# Patient Record
Sex: Male | Born: 1945 | Race: White | Hispanic: No | Smoking: Current every day smoker
Health system: Southern US, Community
[De-identification: ages and names within clinical notes are randomized; demographics above are authoritative.]

## PROBLEM LIST (undated history)

## (undated) DIAGNOSIS — Z7901 Long term (current) use of anticoagulants: Secondary | ICD-10-CM

## (undated) DIAGNOSIS — J189 Pneumonia, unspecified organism: Secondary | ICD-10-CM

## (undated) DIAGNOSIS — J449 Chronic obstructive pulmonary disease, unspecified: Secondary | ICD-10-CM

## (undated) DIAGNOSIS — I82409 Acute embolism and thrombosis of unspecified deep veins of unspecified lower extremity: Secondary | ICD-10-CM

## (undated) DIAGNOSIS — I480 Paroxysmal atrial fibrillation: Secondary | ICD-10-CM

## (undated) DIAGNOSIS — M1990 Unspecified osteoarthritis, unspecified site: Secondary | ICD-10-CM

## (undated) DIAGNOSIS — I2699 Other pulmonary embolism without acute cor pulmonale: Secondary | ICD-10-CM

## (undated) DIAGNOSIS — I509 Heart failure, unspecified: Secondary | ICD-10-CM

## (undated) HISTORY — PX: FRACTURE SURGERY: SHX138

---

## 1967-11-29 DIAGNOSIS — J189 Pneumonia, unspecified organism: Secondary | ICD-10-CM

## 1967-11-29 HISTORY — DX: Pneumonia, unspecified organism: J18.9

## 1977-11-28 HISTORY — PX: ANKLE FRACTURE SURGERY: SHX122

## 2010-12-23 ENCOUNTER — Inpatient Hospital Stay (HOSPITAL_COMMUNITY)
Admission: EM | Admit: 2010-12-23 | Discharge: 2010-12-29 | DRG: 177 | Disposition: A | Discharge status: Home / Self Care | Payer: Self-pay | Attending: Family Medicine | Admitting: Family Medicine

## 2010-12-23 DIAGNOSIS — J852 Abscess of lung without pneumonia: Principal | ICD-10-CM | POA: Diagnosis present

## 2010-12-23 DIAGNOSIS — J9819 Other pulmonary collapse: Secondary | ICD-10-CM | POA: Diagnosis present

## 2010-12-23 DIAGNOSIS — E872 Acidosis, unspecified: Secondary | ICD-10-CM | POA: Diagnosis present

## 2010-12-23 DIAGNOSIS — J690 Pneumonitis due to inhalation of food and vomit: Secondary | ICD-10-CM | POA: Diagnosis present

## 2010-12-23 DIAGNOSIS — Q211 Atrial septal defect: Secondary | ICD-10-CM

## 2010-12-23 DIAGNOSIS — I498 Other specified cardiac arrhythmias: Secondary | ICD-10-CM | POA: Diagnosis present

## 2010-12-23 DIAGNOSIS — N2589 Other disorders resulting from impaired renal tubular function: Secondary | ICD-10-CM | POA: Diagnosis present

## 2010-12-23 DIAGNOSIS — J4489 Other specified chronic obstructive pulmonary disease: Secondary | ICD-10-CM | POA: Diagnosis present

## 2010-12-23 DIAGNOSIS — Q2111 Secundum atrial septal defect: Secondary | ICD-10-CM

## 2010-12-23 DIAGNOSIS — Q619 Cystic kidney disease, unspecified: Secondary | ICD-10-CM

## 2010-12-23 DIAGNOSIS — K5900 Constipation, unspecified: Secondary | ICD-10-CM | POA: Diagnosis present

## 2010-12-23 DIAGNOSIS — E876 Hypokalemia: Secondary | ICD-10-CM | POA: Diagnosis present

## 2010-12-23 DIAGNOSIS — F17200 Nicotine dependence, unspecified, uncomplicated: Secondary | ICD-10-CM | POA: Diagnosis present

## 2010-12-23 DIAGNOSIS — J449 Chronic obstructive pulmonary disease, unspecified: Secondary | ICD-10-CM | POA: Diagnosis present

## 2010-12-23 DIAGNOSIS — D539 Nutritional anemia, unspecified: Secondary | ICD-10-CM | POA: Diagnosis present

## 2010-12-23 DIAGNOSIS — E785 Hyperlipidemia, unspecified: Secondary | ICD-10-CM | POA: Diagnosis present

## 2010-12-23 LAB — DIFFERENTIAL
Basophils Absolute: 0 10*3/uL (ref 0.0–0.1)
Eosinophils Absolute: 0 10*3/uL (ref 0.0–0.7)
Eosinophils Relative: 0 % (ref 0–5)
Monocytes Absolute: 1.1 10*3/uL — ABNORMAL HIGH (ref 0.1–1.0)
Neutrophils Relative %: 92 % — ABNORMAL HIGH (ref 43–77)

## 2010-12-23 LAB — HEPATIC FUNCTION PANEL
Bilirubin, Direct: 0.2 mg/dL (ref 0.0–0.3)
Indirect Bilirubin: 0.4 mg/dL (ref 0.3–0.9)
Total Protein: 5.9 g/dL — ABNORMAL LOW (ref 6.0–8.3)

## 2010-12-23 LAB — BASIC METABOLIC PANEL
Calcium: 8.3 mg/dL — ABNORMAL LOW (ref 8.4–10.5)
GFR calc Af Amer: >60 mL/min (ref >60)
GFR calc non Af Amer: >60 mL/min (ref >60)
Potassium: 2.8 mEq/L — ABNORMAL LOW (ref 3.5–5.1)
Sodium: 142 mEq/L (ref 135–145)

## 2010-12-23 LAB — CBC
Platelets: 289 10*3/uL (ref 150–400)
RDW: 14.8 % (ref 11.5–15.5)
WBC: 26.5 10*3/uL — ABNORMAL HIGH (ref 4.0–10.5)

## 2010-12-23 LAB — APTT: aPTT: 30 seconds (ref 24–37)

## 2010-12-23 LAB — MAGNESIUM: Magnesium: 1.3 mg/dL — ABNORMAL LOW (ref 1.5–2.5)

## 2010-12-23 LAB — PROTIME-INR: Prothrombin Time: 15.7 seconds — ABNORMAL HIGH (ref 11.6–15.2)

## 2010-12-24 DIAGNOSIS — J690 Pneumonitis due to inhalation of food and vomit: Secondary | ICD-10-CM

## 2010-12-24 DIAGNOSIS — R911 Solitary pulmonary nodule: Secondary | ICD-10-CM

## 2010-12-24 DIAGNOSIS — J852 Abscess of lung without pneumonia: Secondary | ICD-10-CM

## 2010-12-24 LAB — LIPID PANEL
Cholesterol: 128 mg/dL (ref 0–200)
HDL: 25 mg/dL — ABNORMAL LOW (ref >39)

## 2010-12-24 LAB — BASIC METABOLIC PANEL
BUN: 7 mg/dL (ref 6–23)
Chloride: 116 mEq/L — ABNORMAL HIGH (ref 96–112)
Creatinine, Ser: 0.8 mg/dL (ref 0.4–1.5)
GFR calc Af Amer: >60 mL/min (ref >60)
GFR calc non Af Amer: >60 mL/min (ref >60)
Glucose, Bld: 117 mg/dL — ABNORMAL HIGH (ref 70–99)

## 2010-12-24 LAB — EXPECTORATED SPUTUM ASSESSMENT W GRAM STAIN, RFLX TO RESP C

## 2010-12-24 LAB — CBC
Hemoglobin: 9.9 g/dL — ABNORMAL LOW (ref 13.0–17.0)
MCH: 34 pg (ref 26.0–34.0)
MCV: 99 fL (ref 78.0–100.0)
Platelets: 320 10*3/uL (ref 150–400)
RBC: 2.91 MIL/uL — ABNORMAL LOW (ref 4.22–5.81)
RDW: 15.3 % (ref 11.5–15.5)
WBC: 18.7 10*3/uL — ABNORMAL HIGH (ref 4.0–10.5)

## 2010-12-24 LAB — NA AND K (SODIUM & POTASSIUM), RAND UR
Potassium Urine: 37 mEq/L
Sodium, Ur: 61 mEq/L

## 2010-12-24 LAB — MAGNESIUM: Magnesium: 1.4 mg/dL — ABNORMAL LOW (ref 1.5–2.5)

## 2010-12-24 NOTE — H&P (Signed)
NAME:  Jeremy Herman, Jeremy Herman NO.:  [id number]  MEDICAL RECORD NO.:  [id number]          PATIENT TYPE:  INP  LOCATION:  1832                         FACILITY:  [facility]  PHYSICIAN:  Vania Rea, M.D. DATE OF BIRTH:  04/04/1946  DATE OF ADMISSION:  12/23/2010 DATE OF DISCHARGE:                             HISTORY & PHYSICAL   PRIMARY CARE PHYSICIAN:  Louanna Raw at [facility].  CHIEF COMPLAINT:  Palpitations.  HISTORY OF PRESENT ILLNESS:  This is a 65 year old Caucasian gentleman, who considers himself in very fair health, does not visit hospital much, tends to go to his primary care physician whenever he has any minor problems.  He said his last visit to his physician about one month ago for diarrhea and received penicillin for possible infectious colitis, but today started having severe palpitations, went to his physician, who found him to be in SVT, he was transported to the emergency room, he received adenosine, and SVT resolved but on evaluation in the emergency room, he was found to have metabolic derangement and to have an abnormal chest x-ray.  Hospitalist service called to assist with admission.  The patient denies any fever or night sweats, but he has been having cough productive of brown-creamy sputum for the past 2-3 weeks and he has lost 20 pounds in weight over the past 4 weeks.  He smokes approximately a pack to a pack and a half per day for the past 40 years. He denies any family history of cancer.  Denies exposure to any infectious disease.  He is married without children.  Lives with his wife.  PAST MEDICAL HISTORY: 1. Tobacco abuse. 2. Remote history of alcohol abuse.  He has been clean for the past 10     years.  MEDICATIONS:  None.  ALLERGIES:  No known drug allergies.  SOCIAL HISTORY:  Currently, smoking three-quarter pack of cigarettes per day, smoked as much as a pack and a half over the past 40 years. Alcohol use as  noted above.  Denies illicit drug use.  He is a retired Public affairs consultant.  His mother died of a brain aneurysm.  His father died in his 17s.  He does not know his medical problems.  He has one brother, who was far as he noticed health.  FAMILY HISTORY:  There is no family history of addictions.  REVIEW OF SYSTEMS:  Other than noted above are unremarkable.  The patient said he had a colonoscopy around age 83 and at that time, it was normal.  PHYSICAL EXAMINATION:  GENERAL:  Pleasant, middle-aged Caucasian gentleman, sitting up in the stretcher, in no acute distress. VITAL SIGNS:  His temperature is 99.3, his pulse is 106, his respiration 20, blood pressure 99/48, he is saturating 96% on room air. HEENT:  His pupils are round and equal.  Mucous membranes are pink. Anicteric. NECK:  No cervical lymphadenopathy or thyromegaly.  No jugular venous distention. CHEST:  He has coarse crackles in the right midzone in the left lower lobe posteriorly. CARDIOVASCULAR SYSTEM:  Regular rhythm.  No murmur heard. ABDOMEN:  Soft and nontender.  No masses. EXTREMITIES:  Without edema.  He has 2+ dorsalis pedis pulses bilaterally. CENTRAL NERVOUS SYSTEM:  Cranial nerves II through XII are grossly intact.  He has no focal neurologic deficit.  LABORATORY DATA:  His sodium is 142, potassium is 2.8, chloride 114, CO2 of 20, glucose 125, BUN 10, creatinine 0.9, and calcium 8.3.  His white count is 26.5, hemoglobin 10.2, hematocrit 29.1, and platelets 289,000. He has 92% neutrophils.  Absolute neutrophil count is 24.3.  Morphology shows toxic granulations.  Chest x-ray shows cavities with air-fluid levels in the superior segments of the right lower lobe, small amount of linear atelectasis or scarring in the left lung base.  Mild bronchitic changes.  The CT scan of his chest with contrast shows adjacent cavity and mass-like areas with moderate wall thickening and air-fluid levels in the superior segment  of the right lower lobe measuring 10.4 cm in maximum diameter.  A 2.9-cm mass-like density in the left lower lobe, patchy densities in the right upper and middle lobes, the combination of lung findings most compatible with infectious process with septic emboli, malignancy less likely, incompletely included and incompletely assessed complex left renal cyst.  Renal ultrasound recommended to exclude a complex cystic renal mass and mild hepatic steatosis.  ASSESSMENT: 1. Supraventricular tachycardia, resolved. 2. Hypokalemia causing the above. 3. Multiple lung abscesses. 4. Probable chronic bronchitis. 5. Tobacco abuse. 6. History of alcohol abuse.  PLAN: 1. We will admit this gentleman for antibiotic therapy and repletion     of his potassium.  Because of the possible multiple lung abscesses,     we will get a 2-D echo to look for source of emboli and we will     also get an abdominal ultrasound to look for any evidence of other     infectious foci. 2. Other plans as per order.     Vania Rea, M.D.     LC/MEDQ  D:  12/23/2010  T:  12/23/2010  Job:  [id number]  cc:   Louanna Raw  Electronically Signed by Vania Rea M.D. on 12/24/2010 11:06:28 PM

## 2010-12-25 LAB — CHLORIDE, URINE, RANDOM: Chloride Urine: 79 mEq/L

## 2010-12-25 LAB — RENAL FUNCTION PANEL
CO2: 15 mEq/L — ABNORMAL LOW (ref 19–32)
Calcium: 7.5 mg/dL — ABNORMAL LOW (ref 8.4–10.5)
Creatinine, Ser: 0.66 mg/dL (ref 0.4–1.5)
GFR calc Af Amer: >60 mL/min (ref >60)
GFR calc non Af Amer: >60 mL/min (ref >60)
Phosphorus: 2.6 mg/dL (ref 2.3–4.6)
Sodium: 140 mEq/L (ref 135–145)

## 2010-12-25 LAB — CBC
HCT: 25.9 % — ABNORMAL LOW (ref 39.0–52.0)
MCHC: 34.7 g/dL (ref 30.0–36.0)
Platelets: 260 10*3/uL (ref 150–400)
RDW: 15.4 % (ref 11.5–15.5)
WBC: 14.2 10*3/uL — ABNORMAL HIGH (ref 4.0–10.5)

## 2010-12-25 LAB — MAGNESIUM: Magnesium: 1.6 mg/dL (ref 1.5–2.5)

## 2010-12-26 LAB — CBC
Hemoglobin: 9.6 g/dL — ABNORMAL LOW (ref 13.0–17.0)
MCH: 34.4 pg — ABNORMAL HIGH (ref 26.0–34.0)
MCHC: 34.4 g/dL (ref 30.0–36.0)
MCV: 100 fL (ref 78.0–100.0)
Platelets: 321 10*3/uL (ref 150–400)
RBC: 2.79 MIL/uL — ABNORMAL LOW (ref 4.22–5.81)

## 2010-12-26 LAB — URINALYSIS, ROUTINE W REFLEX MICROSCOPIC
Bilirubin Urine: NEGATIVE
Hgb urine dipstick: NEGATIVE
Ketones, ur: NEGATIVE mg/dL
Specific Gravity, Urine: 1.015 (ref 1.005–1.030)
Urobilinogen, UA: 1 mg/dL (ref 0.0–1.0)

## 2010-12-26 LAB — EXPECTORATED SPUTUM ASSESSMENT W GRAM STAIN, RFLX TO RESP C

## 2010-12-26 LAB — RENAL FUNCTION PANEL
BUN: 4 mg/dL — ABNORMAL LOW (ref 6–23)
Chloride: 114 mEq/L — ABNORMAL HIGH (ref 96–112)
Creatinine, Ser: 0.63 mg/dL (ref 0.4–1.5)
Glucose, Bld: 159 mg/dL — ABNORMAL HIGH (ref 70–99)
Potassium: 3.3 mEq/L — ABNORMAL LOW (ref 3.5–5.1)

## 2010-12-27 LAB — GIARDIA/CRYPTOSPORIDIUM SCREEN(EIA): Cryptosporidium Screen (EIA): NEGATIVE

## 2010-12-27 LAB — CBC
MCH: 34.6 pg — ABNORMAL HIGH (ref 26.0–34.0)
Platelets: 368 10*3/uL (ref 150–400)
RBC: 2.69 MIL/uL — ABNORMAL LOW (ref 4.22–5.81)

## 2010-12-27 LAB — C3 COMPLEMENT: C3 Complement: 97 mg/dL (ref 88–201)

## 2010-12-27 LAB — RHEUMATOID FACTOR: Rhuematoid fact SerPl-aCnc: <10 IU/mL (ref <=14)

## 2010-12-27 LAB — RENAL FUNCTION PANEL
Albumin: 2 g/dL — ABNORMAL LOW (ref 3.5–5.2)
BUN: 3 mg/dL — ABNORMAL LOW (ref 6–23)
Calcium: 7.9 mg/dL — ABNORMAL LOW (ref 8.4–10.5)
Creatinine, Ser: 0.51 mg/dL (ref 0.4–1.5)
Phosphorus: 2.9 mg/dL (ref 2.3–4.6)
Potassium: 3.6 mEq/L (ref 3.5–5.1)

## 2010-12-27 LAB — C4 COMPLEMENT: Complement C4, Body Fluid: 17 mg/dL (ref 16–47)

## 2010-12-27 LAB — MAGNESIUM: Magnesium: 1.7 mg/dL (ref 1.5–2.5)

## 2010-12-28 LAB — ANA: Anti Nuclear Antibody(ANA): NEGATIVE

## 2010-12-28 LAB — CULTURE, RESPIRATORY W GRAM STAIN

## 2010-12-28 LAB — CBC
MCHC: 33.5 g/dL (ref 30.0–36.0)
Platelets: 373 10*3/uL (ref 150–400)
RDW: 15.8 % — ABNORMAL HIGH (ref 11.5–15.5)

## 2010-12-28 LAB — RENAL FUNCTION PANEL
Calcium: 8.1 mg/dL — ABNORMAL LOW (ref 8.4–10.5)
Creatinine, Ser: 0.64 mg/dL (ref 0.4–1.5)
GFR calc Af Amer: >60 mL/min (ref >60)
GFR calc non Af Amer: >60 mL/min (ref >60)
Phosphorus: 3.2 mg/dL (ref 2.3–4.6)
Sodium: 142 mEq/L (ref 135–145)

## 2010-12-29 LAB — CULTURE, BLOOD (ROUTINE X 2)
Culture  Setup Time: 201201262204
Culture: NO GROWTH
Culture: NO GROWTH

## 2010-12-29 LAB — BASIC METABOLIC PANEL
BUN: 3 mg/dL — ABNORMAL LOW (ref 6–23)
Chloride: 115 mEq/L — ABNORMAL HIGH (ref 96–112)
Creatinine, Ser: 0.65 mg/dL (ref 0.4–1.5)
Glucose, Bld: 115 mg/dL — ABNORMAL HIGH (ref 70–99)

## 2010-12-29 LAB — CBC
HCT: 29.7 % — ABNORMAL LOW (ref 39.0–52.0)
MCH: 34.7 pg — ABNORMAL HIGH (ref 26.0–34.0)
MCV: 104.2 fL — ABNORMAL HIGH (ref 78.0–100.0)
Platelets: 459 10*3/uL — ABNORMAL HIGH (ref 150–400)
RBC: 2.85 MIL/uL — ABNORMAL LOW (ref 4.22–5.81)
RDW: 15.9 % — ABNORMAL HIGH (ref 11.5–15.5)

## 2010-12-29 LAB — STOOL CULTURE

## 2010-12-30 LAB — PROTEIN ELECTROPHORESIS, SERUM
Alpha-1-Globulin: 8.8 % — ABNORMAL HIGH (ref 2.9–4.9)
Alpha-2-Globulin: 13.5 % — ABNORMAL HIGH (ref 7.1–11.8)
Beta 2: 7 % — ABNORMAL HIGH (ref 3.2–6.5)
Gamma Globulin: 21.7 % — ABNORMAL HIGH (ref 11.1–18.8)
M-Spike, %: NOT DETECTED g/dL

## 2011-01-11 NOTE — Progress Notes (Signed)
NAME:  Jeremy Herman, Jeremy Herman NO.:  [id number]  MEDICAL RECORD NO.:  [id number]          PATIENT TYPE:  INP  LOCATION:  5509                         FACILITY:  [facility]  PHYSICIAN:  Baltazar Najjar, MD     DATE OF BIRTH:  1946/01/19                                PROGRESS NOTE   FINAL CURRENT DIAGNOSES: 1. Cavitary lung lesions, currently on Augmentin. 2. 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 3. Hypokalemia secondary to renal tubular acidosis, corrected with     potassium supplementation. 4. Non-anion gap metabolic acidosis secondary to renal tubular     acidosis, corrected with Polycitra. 5. Supraventricular tachycardia on presentation, currently on sinus     tachycardia. 6. 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  RADIOLOGY/IMAGING: Chest x-ray December 23, 2010, showed cavities with air fluid levels in the superior segment of the right lower lobe.  Small amount of linear atelectasis or scarring in the left lung base.  Mild bronchitic changes. CT chest with contrast showed cavitary mass-like areas with moderate wall thickening and air fluid level in the superior segment of the right lower lobe measuring 10.4 cm in maximum diameter.  A 2.9 cm mass-like density in the left lower lobe.  Patchy densities in the right upper and middle lobes.  Finding most compatible with infectious processes such as septic emboli, malignancy is less likely.  Incidentally also showed left renal cyst.  Mild hepatic steatosis.  Abdominal sonogram showed hepatic steatosis.  A small cyst on the right kidney simple-appearing exophytic cyst; otherwise, normal.  BRIEF ADMITTING HISTORY: Please refer to the H and P dictated on December 23, 2010.  On summary, Mr. Jeremy Herman is a 65 year old man with no significant past medical history, who have not seen a PCP for more than 10 years, was seen at [facility] for palpitation, was found to  have SVTs and was transported to the ED.  HOSPITAL COURSE: In the ER, the patient was found to have metabolic derangement and an abnormal chest x-ray as above and he was admitted to the hospital.  He was found to be hypokalemic, acidotic, and have multiple lung lesions as above.  The patient was supplemented with potassium and a 2-D echo was ordered to rule out emboli.  His 2-D echocardiogram showed possible vegetation on the pulmonary valve, so TEE was done and his TEE showed secundum ASD about 2.3 to 2.4 cm in diameter.  The patient was found to have hypokalemia, non-anion gap metabolic acidosis, and he was supplemented initial with IV KCl and bicarb drip and subsequently I switch him to p.o. potassium and Polycitra.  I sent off urine potassium, sodium, and chloride and I calculated his urinary anion gap, which came elevated at 19 consistent with RTA.  PHYSICAL EXAMINATION: VITAL SIGNS:  Blood pressure 110/67, pulse 112, temperature 98.3, and O2 sats 99% on room air. GENERAL:  He is alert, not in acute distress, well groomed. NECK:  Supple.  No JVD. LUNGS:  Clear to auscultation bilaterally. CARDIOVASCULAR:  S1, S2 regular rhythm and rate. ABDOMEN:  Soft and nontender. EXTREMITIES:  Trace edema.  LABORATORY DATA: Potassium 3.8, BUN 4, and creatinine 0.6.  WBC is 14.3, hemoglobin 8.8, and hematocrit 26.3.  Complement level C4, C3 normal.  Rheumatoid factor is normal.  His Sjogren antibodies are normal.  ASSESSMENT AND PLAN: 1. Cavitary lung lesions.  The patient was seen by PCCM, who     recommended to treat with Augmentin.  The patient was initially     placed on respiratory isolation, which was removed by PCCM.  He     showed improvement with Augmentin.  His white count trending down.     He remained afebrile and clinically improving.  As per last PCCM     note, he will need Augmentin up to 4-6 weeks and they also     recommended to repeat CT chest in 4 weeks and to follow  with     [facility] on discharge. 2. Hypokalemia/non-anion gap metabolic acidosis.  Differential     diagnosis included diarrhea versus renal tubular acidosis.  The     patient denies any history of diarrhea and the matter of fact he     was having constipation on presentation.  His potassium was low in     the 2s.  His CO2 was 15, and his anion gap was normal.  I checked     his urine pH came up elevated to 7.  I also sent for his urine     potassium chloride and sodium, and I calculated his urinary anion     gap, which came about 19.  Above findings mostly consistent with     distal type I renal tubular acidosis, which is rare in adult, so I     sent of labs to check for autoimmune disease including ANA and anti-     Sjogren's antibodies and also checked for compliment rheumatoid     factor, all came back negative.  At this point, I supplemented his     potassium IV initially and I also supplemented his bicarb with     bicarb drip and I transitioned both to oral medication.  The     patient is currently on KCl on p.o. and on Polycitra.  He will need     close nephrological followup on discharge.  I spoke to Dr. Lowell Guitar     from [facility] who recommended to follow with his     PCP, repeat labs and to have a referral sent to [facility] by his PCP after discharge.  For now, continue with     Polycitra and the KCl, and monitor his electrolytes very closely. 3. 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  Discussed     with Dr. Shirlee Latch from Cardiology Service.  Cardiology was consulted     and we are still awaiting Cardiology evaluation/recommendations. 4. Supraventricular tachycardias on presentation, most likely     secondary to hypokalemia, which is corrected; however, the patient     is still running sinus tachycardia.  As per him, he was evaluated     in the past for irregular heartbeat.  I will defer further     management of his  cardiac issues to Cardiology consult. 5. Disposition, pending Cardiology consultation.  The patient will     need PCP set up on discharge that was discussed with case manager     and he made the arrangement to follow up with [facility] on January 28, 2011, at 10:45 a.m.          ______________________________ Baltazar Najjar, MD     SA/MEDQ  D:  12/28/2010  T:  12/28/2010  Job:  [id number]  Electronically Signed by Hannah Beat MD on 01/11/2011 08:21:33 PM

## 2011-01-19 NOTE — Consult Note (Signed)
NAME:  Jeremy Herman, NEZ NO.:  [id number]  MEDICAL RECORD NO.:  [id number]           PATIENT TYPE:  I  LOCATION:  5509                         FACILITY:  [facility]  PHYSICIAN:  Bevelyn Buckles. Bensimhon, MDDATE OF BIRTH:  February 14, 1946  DATE OF CONSULTATION:  12/28/2010 DATE OF DISCHARGE:                                CONSULTATION   REASON FOR CONSULTATION:  Atrial septal defect found on TEE.  HISTORY OF PRESENT ILLNESS:  Jeremy Herman is a 65 year old male with a history of palpitations, COPD with ongoing tobacco use, former alcohol abuse but no known history of coronary artery disease.  He was admitted several days ago with fevers and chills and palpitations.  On arrival to the ER, he was found to be in SVT which he underwent conversion with adenosine.  Workup has shown multiple lung abscesses secondary to presumed aspiration pneumonia.  Prior to this hospitalization, Jeremy Herman notes several week history of profuse nausea and vomiting as well as fevers and chills which he relates secondary to a viral illness.  On admission, his white count was 26.5 thousand.  CT showed multiple lung abscesses.  He was seen by Dr. Billy Fischer who reviewed his studies and felt this was classic aspiration pneumonia.  He has been treated with antibiotics.  While in the hospital, he has not had any further palpitations.  He denies any chest pain or known coronary disease.  He had a stress test in the 1980s which he said was normal.  He also has had palpitations in the past but no documented arrhythmias per his report.  While in the hospital, he did get a chest wall echo which showed question of mildly depressed LV function and question of vegetation on the pulmonic valve. He underwent transesophageal echocardiogram today by Dr. Shirlee Latch.  This showed LV ejection fraction of approximately 50% with mild global hypokinesis.  The RV was mildly dilated.  There was no evidence of intracardiac  vegetations, however, there was a large secundum atrial septal defect which measured about 2.3-2.4 cm in diameter.  We are called to help with management of this.  He denies any history of heart failure.  He has never had a stroke or any other evidence of embolic phenomenon.  He has lost about 35 pounds with his recent illness.  REVIEW OF SYSTEMS:  Remainder review of systems are negative except for HPI and problem list.  PAST MEDICAL HISTORY: 1. Palpitations. 2. COPD with ongoing tobacco use. 3. History of alcohol abuse, reports quit 10 years ago.  MEDICATIONS:  Current medications are Augmentin, guaifenesin, potassium, and nicotine patch.  ALLERGIES:  No known drug allergies.  SOCIAL HISTORY:  He is a retired Public affairs consultant.  He previously smoked a pack of cigarettes a day for 40 years.  Currently he says he smokes about half a pack cigarettes a day.  He has a history of heavy alcohol use but says he has quit completely for 10 years.  Denies any drug use.  FAMILY HISTORY:  Mother died of brain aneurysm.  Father died in his 30s. He does not know his medical problems.  PHYSICAL EXAMINATION:  GENERAL:  He is in no acute distress. VITAL SIGNS:  Respirations are unlabored.  Blood pressure is 110/67, heart rate is 110.  He is afebrile. HEENT:  Normal. NECK:  Supple.  There is no JVD.  Carotids are 2+ bilaterally without bruits.  There is no lymphadenopathy or thyromegaly. CARDIAC:  PMI is nondisplaced.  He is mildly tachycardic but regular. No obvious murmurs. LUNGS:  Clear. ABDOMEN:  Soft, nontender, nondistended, no hepatosplenomegaly, no bruits, no masses. EXTREMITIES:  Warm with no cyanosis or clubbing.  There is 1+ ankle edema bilaterally.  There is no evidence of embolic phenomenon.  There are no splinter hemorrhages, no Janeway lesions or Osler's nodes. NEURO:  Alert and oriented x3.  Cranial nerves II-XII are intact.  Moves all 4 extremities without difficulty.   Affect is pleasant.  EKG shows sinus tachycardia with no ST-T wave abnormalities.  White count is 14.3 thousand, hemoglobin is 8.8, platelet 373,000.  Sodium 142, potassium 3.8, BUN of 4, creatinine 0.64.  PPD is negative.  I have reviewed all his CTs and echocardiogram extensively. The findings as noted above.  ASSESSMENT: 1. Large secundum atrial septal defect (2.3-2.4 cm in diameter) with     mild right ventricular dilation. 2. Left ventricular ejection fraction of approximately 50%. 3. Aspiration pneumonia with multiple lung abscesses. 4. Supraventricular tachycardia, resolved. 5. Tobacco use. 6. History of alcohol abuse, reportedly quit 10 years ago. 7. Macrocytic anemia.  PLAN/DISCUSSION:  I agree with Dr. Sung Amabile that Mr. Lennartz's lung abscesses are related to aspiration in the setting of recent GI illness. 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  While I do not think that this is related to his current illness, I do think he will need to have ASD closed once his current infection is cleared.  I discussed with him open versus percutaneous options of repair and  I suspect he may be a candidate for percutaneous repair.  RECOMMENDATIONS AT THIS POINT: 1. Complete 4-6-week antibiotic treatment for his lung abscesses. 2. Once his lung infection is adequately cleared, we will arrange for     outpatient followup with Dr. Tonny Bollman for right and left     heart catheterization to assess his coronary arteries and his     pulmonary pressures and     quantify his shunt fraction.  This should be in preparation for     evaluation of a percutaneous ASD repair. 3. Treat with aspirin 325 mg a day.  We appreciate the consult.  Please do not hesitate to call us with questions.     Bevelyn Buckles. Bensimhon, MD     DRB/MEDQ  D:  12/28/2010  T:  12/29/2010  Job:  [id number]  Electronically Signed by Arvilla Meres MD on 01/19/2011  01:55:49 PM

## 2011-01-21 ENCOUNTER — Inpatient Hospital Stay: Payer: Self-pay | Admitting: Pulmonary Disease

## 2011-01-23 NOTE — Discharge Summary (Addendum)
NAME:  Jeremy Herman, Jeremy Herman NO.:  [id number]  MEDICAL RECORD NO.:  [id number]          PATIENT TYPE:  INP  LOCATION:  5509                         FACILITY:  [facility]  PHYSICIAN:  Lonia Blood, M.D.       DATE OF BIRTH:  [birth year]  DATE OF ADMISSION:  12/23/2010 DATE OF DISCHARGE:  12/29/2010                              DISCHARGE SUMMARY   Dr. Toya Smothers, nurse practitioner dictating a discharge summary on behalf of Dr. Lonia Blood.  PRIMARY CARE PROVIDER:  Dr. Andrey Campanile at [facility]  CARDIOLOGIST:  Bevelyn Buckles. Bensimhon, MD  PULMONOLOGIST:  Coralyn Helling, MD, [place]  DISCHARGE DIAGNOSES: 1. Cavitary lung lesions. 2. 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 3. Hypokalemia secondary to renal tubular acidosis. 4. Non-anion gap metabolic acidosis secondary to renal tubular     acidosis. 5. Supraventricular tachycardia on presentation, currently sinus     tachycardia. 6. 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  DISCHARGE MEDICATIONS: 1. Augmentin 875 mg p.o. q. 12 h. for 6 weeks. 2. Aspirin 325 mg p.o. daily. 3. Mucinex 600 mg b.i.d. 4. Nicotine patch 21 mg/24 hour patch q.24 h. topical. 5. Advil 200 mg 4 tablets p.o. as needed for pain.  PROCEDURES: 1. Chest x-ray on December 23, 2010, yields cavities obtaining air-     fluid levels in the superior segment of the right lower lobe.     Small amount of linear atelectasis or scarring at the left lung     base.  Mild bronchitic changes. 2. CT of the chest on December 23, 2010, yield adjacent cavitary mass-     like areas with moderate wall thickening and air-fluid levels in     superior segment of the right lower lobe measuring 10.4 cm in     maximum diameter. 3. A 2.9 cm mass-like density in the left lower lobe.  Patchy     densities in the right upper and middle lobes.  This combination of     lung finding is most compatible with an infectious process.     Incompletely included and  incompletely assessed probable left renal     cyst.  Mild hepatic steatosis. 4. Ultrasound of the abdomen done on December 23, 2010 yield hepatic     steatosis.  Small cyst on the right kidney. 5. A 2-D echo done on December 25, 2010, yield suspicious that LVEF is     mildly depressed with hypokinesis of the inferior septal walls.     Endocardium is difficult to see.  Cavity size, normal.  Wall     thickness normal.  Pulmonic valve, mildly thickened suspicious for     vegetation. 6. TEE done on December 28, 2010, yields left ventricle size normal,     mild concentric hypertrophy.  Systolic function mildly reduced.     Estimated ejection fraction of 50%.  Mild global hypokinesis.     Aortic valve, no evidence of vegetation.  Aorta, normal caliber     with grade 3 plaque in the descending thoracic aorta.  Mitral     valve, no evidence of vegetation mild regurg.  Left atrium, mildly     dilated.  No evidence of thrombus.  Right ventricle size, mildly     dilated systolic function, mildly reduced. 7. Right atrium, atrium was mildly dilated. 8. Atrial septum, 2.4 cm secundum atrial septal defect with left-to-     right flow.  Tricuspid valve, no evidence of vegetation.  PERTINENT LABORATORY DATA:  On admission, WBC is 26.5, hemoglobin 10.2, hematocrit 29.1, platelets 289, neutrophils 92%, and absolute neutrophils 24.3.  Sodium 142, potassium 2.8, chloride 114, CO2 of 20, BUN 10, creatinine 0.93, and glucose 125.  PT 15.7, INR 1.23, PTT 30, and magnesium 1.3.  Labs on December 29, 2010, sodium 143, potassium 3.7, chloride 115, CO2 of 22, BUN 3, creatinine 0.65, and glucose 115.  CBC 12.9, hemoglobin 9.9, hematocrit 29.7, and platelets 459.  CONSULTATIONS: 1. Oley Balm Simonds, MD with pulmonology on December 24, 2010, changed     antibiotics to Augmentin for 4-6 weeks secondary to cavitated lung     abscess. 2. Bevelyn Buckles. Bensimhon, MD with [facility].  SUMMARY:  Jeremy Herman is a  65 year old male who presented to the ED on December 23, 2010, with severe palpitations.  He had gone to his primary care provider initially with symptoms and was found to be in SVT and was transported to the Emergency Room.  He received adenosine and the SVT resolved.  Workup in the Emergency Room also yielded metabolic acidosis and hypokalemia as well as an abnormal chest x-ray.  He was admitted by the hospitalist for further evaluation and treatment.  HOSPITAL COURSE: 1. Cavitary lung lesion.  The patient was seen by PCCM who recommended     to treat with Augmentin.  The patient was initially placed on     respiratory isolation which was removed by PCCM.  He showed     improvement with Augmentin.  His white count is trending down.     Remains afebrile and clinically improving.  As per last PCCM note,     he will need Augmentin for 4-6 weeks and repeat chest x-ray done in     4 weeks.  Prior to his follow-up appointment with Pulmonology     scheduled for January 21, 2011, at 2 p.m. 2. Hypokalemia/non-anion gap metabolic acidosis.  Differential     diagnosis included diarrhea versus renal tubular acidosis.  The     patient denies history of diarrhea.  Potassium was low.  His CO2     was 15 and his anion gap was normal.  Urine pH elevated to 7.     Urine potassium chloride and sodium obtained.  Urine anion gap     calculated to be 19.  Above finding is most consistent with distal     type 1 renal tubular acidosis.  This was followed up with labs     checking for autoimmune disease including ANA and anti-Sjogren's     antibodies and rheumatoid factor all of which came back negative.     His potassium was supplemented intravenously initially and then     changed to p.o.  Bicarb drip was initiated and transitioned over to     p.o.  The patient will need close nephrologic followup on     discharge.  Dr. Lowell Guitar was contacted by Dr. Cleotis Lema with [facility].  Dr. Lowell Guitar recommended  to follow up with his primary care     provider who should repeat the labs and have a referral sent to  [facility] after discharge. 3. 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  Discussed     with Dr. Shirlee Latch from Cardiology Service.  Cardiology was consulted.     Dr. Gala Romney saw the patient and will follow up with the patient     on outpatient basis in 4-6 weeks once his infection has cleared up. 4. Supraventricular tachycardias on presentation, most likely     secondary to hypokalemia which is corrected.  The patient still     runs sinus tach.  He has been evaluated in the past according to     him for irregular heartbeat.  Outpatient workup with Cardiology to     follow.  PHYSICAL EXAMINATION:  Documented in rounding note dated December 29, 2010.  FOLLOW UP:  The patient to see Dr. Craige Cotta with Pulmonology on January 21, 2011, at 2:00 p.m.  The office will call him to arrange for a chest CT prior to that visit.  The patient also will follow up with Dr. Andrey Campanile at [facility] on January 28, 2011 at 10:45 a.m.  CONDITION ON DISCHARGE:  Stable.  TIMES SPENT ON DISCHARGE:  45 minutes.     Gwenyth Bender, NP   ______________________________ Lonia Blood, M.D.    KMB/MEDQ  D:  12/29/2010  T:  12/29/2010  Job:  [id number]  cc:   Dr. Andrey Campanile  Electronically Signed by Toya Smothers  on 03/15/2011 10:26:44 AM Electronically Signed by Lonia Blood M.D. on 03/15/2011 12:34:49 PM

## 2013-09-17 ENCOUNTER — Inpatient Hospital Stay (HOSPITAL_COMMUNITY)
Admission: EM | Admit: 2013-09-17 | Discharge: 2013-09-18 | DRG: 299 | Disposition: A | Discharge status: Home / Self Care | Payer: Medicare Other | Attending: Internal Medicine | Admitting: Internal Medicine

## 2013-09-17 ENCOUNTER — Encounter (HOSPITAL_COMMUNITY): Payer: Self-pay | Admitting: Emergency Medicine

## 2013-09-17 ENCOUNTER — Emergency Department (HOSPITAL_COMMUNITY): Payer: Medicare Other

## 2013-09-17 DIAGNOSIS — I82403 Acute embolism and thrombosis of unspecified deep veins of lower extremity, bilateral: Secondary | ICD-10-CM | POA: Diagnosis present

## 2013-09-17 DIAGNOSIS — I2510 Atherosclerotic heart disease of native coronary artery without angina pectoris: Secondary | ICD-10-CM | POA: Diagnosis present

## 2013-09-17 DIAGNOSIS — F17200 Nicotine dependence, unspecified, uncomplicated: Secondary | ICD-10-CM | POA: Diagnosis present

## 2013-09-17 DIAGNOSIS — Z79899 Other long term (current) drug therapy: Secondary | ICD-10-CM

## 2013-09-17 DIAGNOSIS — I824Z9 Acute embolism and thrombosis of unspecified deep veins of unspecified distal lower extremity: Principal | ICD-10-CM | POA: Diagnosis present

## 2013-09-17 DIAGNOSIS — I2699 Other pulmonary embolism without acute cor pulmonale: Secondary | ICD-10-CM | POA: Diagnosis present

## 2013-09-17 DIAGNOSIS — D72829 Elevated white blood cell count, unspecified: Secondary | ICD-10-CM | POA: Diagnosis present

## 2013-09-17 LAB — CBC
MCH: 39.4 pg — ABNORMAL HIGH (ref 26.0–34.0)
MCV: 114.1 fL — ABNORMAL HIGH (ref 78.0–100.0)
Platelets: 223 10*3/uL (ref 150–400)
RBC: 3.48 MIL/uL — ABNORMAL LOW (ref 4.22–5.81)
RDW: 14.5 % (ref 11.5–15.5)
WBC: 11.6 10*3/uL — ABNORMAL HIGH (ref 4.0–10.5)

## 2013-09-17 LAB — BASIC METABOLIC PANEL
CO2: 22 mEq/L (ref 19–32)
Calcium: 9.2 mg/dL (ref 8.4–10.5)
Creatinine, Ser: 0.58 mg/dL (ref 0.50–1.35)
GFR calc Af Amer: >90 mL/min (ref >90)
Glucose, Bld: 112 mg/dL — ABNORMAL HIGH (ref 70–99)
Potassium: 3.5 mEq/L (ref 3.5–5.1)

## 2013-09-17 LAB — CBC WITH DIFFERENTIAL/PLATELET
Basophils Absolute: 0 10*3/uL (ref 0.0–0.1)
Eosinophils Absolute: 0 10*3/uL (ref 0.0–0.7)
Eosinophils Relative: 0 % (ref 0–5)
HCT: 39.7 % (ref 39.0–52.0)
Hemoglobin: 13.7 g/dL (ref 13.0–17.0)
Lymphocytes Relative: 22 % (ref 12–46)
Lymphs Abs: 2.4 10*3/uL (ref 0.7–4.0)
MCV: 115.1 fL — ABNORMAL HIGH (ref 78.0–100.0)
Monocytes Relative: 8 % (ref 3–12)
Neutro Abs: 7.7 10*3/uL (ref 1.7–7.7)
Platelets: 228 10*3/uL (ref 150–400)
RBC: 3.45 MIL/uL — ABNORMAL LOW (ref 4.22–5.81)
RDW: 14.7 % (ref 11.5–15.5)
WBC: 11.1 10*3/uL — ABNORMAL HIGH (ref 4.0–10.5)

## 2013-09-17 LAB — COMPREHENSIVE METABOLIC PANEL
AST: 58 U/L — ABNORMAL HIGH (ref 0–37)
Alkaline Phosphatase: 85 U/L (ref 39–117)
CO2: 24 mEq/L (ref 19–32)
Calcium: 9.1 mg/dL (ref 8.4–10.5)
Creatinine, Ser: 0.45 mg/dL — ABNORMAL LOW (ref 0.50–1.35)
GFR calc Af Amer: >90 mL/min (ref >90)
GFR calc non Af Amer: >90 mL/min (ref >90)
Sodium: 134 mEq/L — ABNORMAL LOW (ref 135–145)
Total Bilirubin: 1.1 mg/dL (ref 0.3–1.2)
Total Protein: 7 g/dL (ref 6.0–8.3)

## 2013-09-17 LAB — PHOSPHORUS: Phosphorus: 2.9 mg/dL (ref 2.3–4.6)

## 2013-09-17 LAB — PROTIME-INR
INR: 0.97 (ref 0.00–1.49)
Prothrombin Time: 12.7 seconds (ref 11.6–15.2)

## 2013-09-17 LAB — POCT I-STAT TROPONIN I

## 2013-09-17 MED ORDER — SODIUM CHLORIDE 0.9 % IJ SOLN
3.0000 mL | Freq: Two times a day (BID) | INTRAMUSCULAR | Status: DC
  Administered 2013-09-17 – 2013-09-18 (×2): 3 mL via INTRAVENOUS

## 2013-09-17 MED ORDER — ENOXAPARIN SODIUM 80 MG/0.8ML ~~LOC~~ SOLN
80.0000 mg | SUBCUTANEOUS | Status: AC
  Administered 2013-09-17: 80 mg via SUBCUTANEOUS
  Filled 2013-09-17: qty 0.8

## 2013-09-17 MED ORDER — ACETAMINOPHEN 650 MG RE SUPP: 650.0000 mg | Freq: Four times a day (QID) | RECTAL | Status: DC | PRN

## 2013-09-17 MED ORDER — ADULT MULTIVITAMIN W/MINERALS CH
1.0000 | ORAL_TABLET | Freq: Every day | ORAL | Status: DC
  Administered 2013-09-17 – 2013-09-18 (×2): 1 via ORAL
  Filled 2013-09-17 (×2): qty 1

## 2013-09-17 MED ORDER — WARFARIN SODIUM 7.5 MG PO TABS
7.5000 mg | ORAL_TABLET | Freq: Once | ORAL | Status: AC
  Administered 2013-09-17: 21:00:00 7.5 mg via ORAL
  Filled 2013-09-17: qty 1

## 2013-09-17 MED ORDER — MORPHINE SULFATE 2 MG/ML IJ SOLN: 1.0000 mg | INTRAMUSCULAR | Status: DC | PRN

## 2013-09-17 MED ORDER — ONDANSETRON HCL 4 MG PO TABS: 4.0000 mg | ORAL_TABLET | Freq: Four times a day (QID) | ORAL | Status: DC | PRN

## 2013-09-17 MED ORDER — ONDANSETRON HCL 4 MG/2ML IJ SOLN: 4.0000 mg | Freq: Four times a day (QID) | INTRAMUSCULAR | Status: DC | PRN

## 2013-09-17 MED ORDER — HYDROCODONE-ACETAMINOPHEN 5-325 MG PO TABS: 1.0000 | ORAL_TABLET | ORAL | Status: DC | PRN

## 2013-09-17 MED ORDER — ENOXAPARIN SODIUM 80 MG/0.8ML ~~LOC~~ SOLN
80.0000 mg | Freq: Two times a day (BID) | SUBCUTANEOUS | Status: DC
  Administered 2013-09-18: 05:00:00 80 mg via SUBCUTANEOUS
  Filled 2013-09-17 (×3): qty 0.8

## 2013-09-17 MED ORDER — WARFARIN VIDEO: Freq: Once | Status: DC

## 2013-09-17 MED ORDER — SODIUM CHLORIDE 0.9 % IV SOLN
INTRAVENOUS | Status: DC
  Administered 2013-09-17: 21:00:00 via INTRAVENOUS

## 2013-09-17 MED ORDER — IOHEXOL 350 MG/ML SOLN
100.0000 mL | Freq: Once | INTRAVENOUS | Status: AC | PRN
  Administered 2013-09-17: 100 mL via INTRAVENOUS

## 2013-09-17 MED ORDER — WARFARIN - PHARMACIST DOSING INPATIENT: Freq: Every day | Status: DC

## 2013-09-17 MED ORDER — COUMADIN BOOK
Freq: Once | Status: DC
Start: 2013-09-18 — End: 2013-09-18
  Filled 2013-09-17: qty 1

## 2013-09-17 MED ORDER — COUMADIN BOOK
Freq: Once | Status: DC
  Filled 2013-09-17: qty 1

## 2013-09-17 MED ORDER — ACETAMINOPHEN 325 MG PO TABS: 650.0000 mg | ORAL_TABLET | Freq: Four times a day (QID) | ORAL | Status: DC | PRN

## 2013-09-17 NOTE — Progress Notes (Addendum)
ANTICOAGULATION CONSULT NOTE - Initial Consult  Pharmacy Consult for Lovenox & Warfarin Indication: Bilateral Pulmonary Embolism & DVT  No Known Allergies  Patient Measurements: Height: 5\' 11"  (180.3 cm) Weight: 178 lb (80.74 kg) IBW/kg (Calculated) : 75.3   Vital Signs: Temp: 98.8 F (37.1 C) (10/21 1504) Temp src: Oral (10/21 1504) BP: 123/76 mmHg (10/21 1730) Pulse Rate: 109 (10/21 1730)  Labs:  Recent Labs  09/17/13 1533  HGB 13.7  HCT 39.7  PLT 223  LABPROT 12.7  INR 0.97  CREATININE 0.58    Estimated Creatinine Clearance: 96.7 ml/min (by C-G formula based on Cr of 0.58).   Medical History: History reviewed. No pertinent past medical history.  Medications:  Scheduled:  . enoxaparin (LOVENOX) injection  80 mg Subcutaneous STAT  . [START ON 09/18/2013] enoxaparin (LOVENOX) injection  80 mg Subcutaneous Q12H   Infusions:    Assessment:  67 yr male sent to ED from imaging center with known new bilateral LE DVT and thrombosis of right greater saphenous (groin to calf)  CTAngio showed + bilateral PE  Lovenox and Warfarin per RX ordered to begin  Goal of Therapy:  Monitor platelets by anticoagulation protocol: Yes INR goal:  2-3   Plan:  Lovenox 80mg  sq q12h (first dose sent to ER STAT) Follow CBC while on Lovenox Warfarin 7.5mg  po x 1 tonight Warfarin book/video ordered  Ervan Heber, Joselyn Glassman, PharmD 09/17/2013,5:35 PM

## 2013-09-17 NOTE — Progress Notes (Signed)
Pt does not want to sign up for My Chart at current time. Briscoe Burns BSN, RN-BC Admissions RN  09/17/2013 6:21 PM

## 2013-09-17 NOTE — ED Provider Notes (Signed)
Medical screening examination/treatment/procedure(s) were conducted as a shared visit with non-physician practitioner(s) and myself.  I personally evaluated the patient during the encounter Pt sent from UC w/ diagnosis of BL DVT. Pt denies CP, SOB, lightheadedness, but is persistently tachycardic on PE.  Otherwise in NAD.  LLE erythematous, edematous, ttp.  CTA chest ordered and pt found to have BL PE's.  Pt admitted to medical service.     Shanna Cisco, MD 09/17/13 1901

## 2013-09-17 NOTE — H&P (Signed)
[facility] History and Physical  Jeremy Herman UJW:[id number] DOB: 01/30/1946 DOA: 09/17/2013  Referring physician: ER physician PCP: Provider Not In System   Chief Complaint: LLE swelling  HPI:  67 year old male with no significant past medical history who presented initially to urgent care for evaluation of left lower extremity swelling and was subsequently found to have bilateral LE DVT. He was sent to ED for further evaluation. Patient did not have complaints of chest pain, shortness of breath or palpitations. No abdominal pain, no nausea or vomiting. No history of trauma to left lower extremity. In ED, vitals were stable with BP 123/76, HR 110 and T max 98.8 F, O2 saturation of 95% on room air. CT chest angio was significant for bilateral pulmonary embolism. Pt was started on Lovenox in ED.  Assessment and Plan:  Principal Problem:   Bilateral pulmonary embolism and bilateral DVT's - Lovenox and coumadin per pharmacy - monitor on telemetry Active Problems:   Leukocytosis - likely reactive - no fevers and no evidence of pneumonia - continue to monitor    Radiological Exams on Admission: Ct Angio Chest Pe W/cm &/or Wo Cm 09/17/2013     IMPRESSION: Positive for bilateral pulmonary emboli. Mild to moderate clot burden.  Coronary artery calcification  Bilateral scarring especially the right lower lobe where there was a cavitary abscess on the prior CT.  Critical Value/emergent results were called by telephone at the time of interpretation on 09/17/2013 at 5:15 PM to [facility] , who verbally acknowledged these results.   Electronically Signed   By: Marlan Palau M.D.   On: 09/17/2013 17:16   Code Status: Full Family Communication: Pt at bedside Disposition Plan: Admit for further evaluation; adm to telemetry  Manson Passey, MD  [facility] Pager [phone]  Review of Systems:  Constitutional: Negative for fever, chills and malaise/fatigue. Negative for  diaphoresis.  HENT: Negative for hearing loss, ear pain, nosebleeds, congestion, sore throat, neck pain, tinnitus and ear discharge.   Eyes: Negative for blurred vision, double vision, photophobia, pain, discharge and redness.  Respiratory: Negative for cough, hemoptysis, sputum production, shortness of breath, wheezing and stridor.   Cardiovascular: Negative for chest pain, palpitations, orthopnea, claudication and leg swelling.  Gastrointestinal: Negative for nausea, vomiting and abdominal pain. Negative for heartburn, constipation, blood in stool and melena.  Genitourinary: Negative for dysuria, urgency, frequency, hematuria and flank pain.  Musculoskeletal: Negative for myalgias, back pain, joint pain and falls. Positive for LLE swelling Skin: Negative for itching and rash.  Neurological: Negative for dizziness and weakness. Negative for tingling, tremors, sensory change, speech change, focal weakness, loss of consciousness and headaches.  Endo/Heme/Allergies: Negative for environmental allergies and polydipsia. Does not bruise/bleed easily.  Psychiatric/Behavioral: Negative for suicidal ideas. The patient is not nervous/anxious.      Past Medical History  Diagnosis Date  . Medical history non-contributory    Past Surgical History  Procedure Laterality Date  . Ankle surgery  1979    right ankle   Social History:  reports that he has been smoking.  He has never used smokeless tobacco. He reports that he drinks alcohol. He reports that he does not use illicit drugs.  Allergies  Allergen Reactions  . Fish Allergy     diarrhea    Family History: Family medical history significant for HTN, HLD   Prior to Admission medications   Medication Sig Start Date End Date Taking? Authorizing Provider  acetaminophen (TYLENOL) 325 MG tablet Take 650 mg by  mouth every 6 (six) hours as needed for pain (pain).   Yes Historical Provider, MD  aspirin 81 MG chewable tablet Chew 81 mg by mouth  daily.   Yes Historical Provider, MD  Multiple Vitamins-Minerals (MULTIVITAMIN WITH MINERALS) tablet Take 1 tablet by mouth daily.   Yes Historical Provider, MD   Physical Exam: Filed Vitals:   09/17/13 1729 09/17/13 1730 09/17/13 1745 09/17/13 1804  BP:  123/76 123/76 127/70  Pulse:  110 114 113  Temp:      TempSrc:      Resp:  16 18   Height: 5\' 11"  (1.803 m)     Weight: 80.74 kg (178 lb)     SpO2:  95% 96% 95%    Physical Exam  Constitutional: Appears well-developed and well-nourished. No distress.  HENT: Normocephalic. External right and left ear normal. Oropharynx is clear and moist.  Eyes: Conjunctivae and EOM are normal. PERRLA, no scleral icterus.  Neck: Normal ROM. Neck supple. No JVD. No tracheal deviation. No thyromegaly.  CVS: RRR, S1/S2 +, no murmurs, no gallops, no carotid bruit.  Pulmonary: Effort and breath sounds normal, no stridor, rhonchi, wheezes, rales.  Abdominal: Soft. BS +,  no distension, tenderness, rebound or guarding.  Musculoskeletal: Normal range of motion. LLE edema, no tenderness.  Lymphadenopathy: No lymphadenopathy noted, cervical, inguinal. Neuro: Alert. Normal reflexes, muscle tone coordination. No cranial nerve deficit. Skin: Skin is warm and dry. No rash noted. Not diaphoretic. No erythema. No pallor.  Psychiatric: Normal mood and affect. Behavior, judgment, thought content normal.   Labs on Admission:  Basic Metabolic Panel:  Recent Labs Lab 09/17/13 1533  NA 136  K 3.5  CL 102  CO2 22  GLUCOSE 112*  BUN 12  CREATININE 0.58  CALCIUM 9.2   Liver Function Tests: No results found for this basename: AST, ALT, ALKPHOS, BILITOT, PROT, ALBUMIN,  in the last 168 hours No results found for this basename: LIPASE, AMYLASE,  in the last 168 hours No results found for this basename: AMMONIA,  in the last 168 hours CBC:  Recent Labs Lab 09/17/13 1533  WBC 11.6*  HGB 13.7  HCT 39.7  MCV 114.1*  PLT 223   Cardiac Enzymes: No results  found for this basename: CKTOTAL, CKMB, CKMBINDEX, TROPONINI,  in the last 168 hours BNP: No components found with this basename: POCBNP,  CBG: No results found for this basename: GLUCAP,  in the last 168 hours  If 7PM-7AM, please contact night-coverage [email or web address] Password [email or web address] 09/17/2013, 7:04 PM

## 2013-09-17 NOTE — ED Notes (Signed)
Pt states that he was sent over for DVT in right leg. Swelling and redness noted. Pain increases when walking. Pain 8/10.

## 2013-09-17 NOTE — ED Provider Notes (Signed)
CSN: [id number]     Arrival date & time 09/17/13  1451 History   First MD Initiated Contact with Patient 09/17/13 1510     Chief Complaint  Patient presents with  . DVT   (Consider location/radiation/quality/duration/timing/severity/associated sxs/prior Treatment) HPI Comments: Patient is a 66 y/o male who presents to the ED from [facility] imaging center with a positive lower extremity duplex ultrasound. Patient states for the past 1 1/2 weeks he has had worsening redness and swelling to his right lower leg, worsening over the past few days causing pain with ambulation. No pain at rest. Today symptoms got worse, went to Urgent Care and was sent for an ultrasound which was positive for extensive bilateral LE DVT plus thrombosis of right greater saphenous from groin to calf. No prior hx of DVT. Denies any medical problems. He is a daily smoker. Denies chest pain, sob, lightheadedness, numbness, fever or chills.  The history is provided by the patient.    History reviewed. No pertinent past medical history. History reviewed. No pertinent past surgical history. History reviewed. No pertinent family history. History  Substance Use Topics  . Smoking status: Current Every Day Smoker  . Smokeless tobacco: Not on file  . Alcohol Use: Not on file    Review of Systems  Constitutional: Negative for fever and chills.  Respiratory: Negative for chest tightness and shortness of breath.   Cardiovascular: Positive for leg swelling. Negative for chest pain.  Neurological: Negative for numbness.  All other systems reviewed and are negative.    Allergies  Review of patient's allergies indicates not on file.  Home Medications  No current outpatient prescriptions on file. BP 144/93  Pulse 123  Temp(Src) 98.8 F (37.1 C) (Oral)  Resp 16  SpO2 98% Physical Exam  Nursing note and vitals reviewed. Constitutional: He is oriented to person, place, and time. He appears well-developed and  well-nourished. No distress.  HENT:  Head: Normocephalic and atraumatic.  Mouth/Throat: Oropharynx is clear and moist.  Eyes: Conjunctivae are normal.  Neck: Normal range of motion. Neck supple.  Cardiovascular: Regular rhythm, normal heart sounds, intact distal pulses and normal pulses.  Tachycardia present.   +2 right lower extremity edema, significant right lower extremity swelling, erythema. Tender to palpation. +2 PT/DP pulses bilateral.  Pulmonary/Chest: Effort normal and breath sounds normal. No respiratory distress.  Abdominal: Soft. Bowel sounds are normal. There is no tenderness.  Musculoskeletal: Normal range of motion. He exhibits no edema.  Neurological: He is alert and oriented to person, place, and time.  Skin: Skin is warm and dry. He is not diaphoretic.  Psychiatric: He has a normal mood and affect. His behavior is normal.    ED Course  Procedures (including critical care time) Labs Review Labs Reviewed  CBC - Abnormal; Notable for the following:    WBC 11.6 (*)    RBC 3.48 (*)    MCV 114.1 (*)    MCH 39.4 (*)    All other components within normal limits  BASIC METABOLIC PANEL - Abnormal; Notable for the following:    Glucose, Bld 112 (*)    All other components within normal limits  PROTIME-INR  CBC  POCT I-STAT TROPONIN I   Imaging Review Ct Angio Chest Pe W/cm &/or Wo Cm  09/17/2013   CLINICAL DATA:  DVT right leg.  EXAM: CT ANGIOGRAPHY CHEST WITH CONTRAST  TECHNIQUE: Multidetector CT imaging of the chest was performed using the standard protocol during bolus administration of intravenous contrast. Multiplanar  CT image reconstructions including MIPs were obtained to evaluate the vascular anatomy.  CONTRAST:  OMNIPAQUE IOHEXOL 350 MG/ML SOLN  COMPARISON:  CT chest 12/23/2010  FINDINGS: Positive for bilateral pulmonary emboli. Right middle lobe, right lower lobe, and left lower lobe emboli are present. Mild to moderate clot burden overall.  Extensive  coronary artery calcification. Heart size is normal. Negative for aortic dissection or aneurysm.  Scarring in the superior segment right lower lobe compatible with healing infection. Cavitary mass lesion seen in this area previously has resolved. There is patchy scarring in the right middle lobe and right lower lobe laterally in areas of previous pneumonia. Mild scarring in the left lung base.  Negative for mass or adenopathy. No pleural effusion.  Review of the MIP images confirms the above findings.  IMPRESSION: Positive for bilateral pulmonary emboli. Mild to moderate clot burden.  Coronary artery calcification  Bilateral scarring especially the right lower lobe where there was a cavitary abscess on the prior CT.  Critical Value/emergent results were called by telephone at the time of interpretation on 09/17/2013 at 5:15 PM to [facility] , who verbally acknowledged these results.   Electronically Signed   By: Marlan Palau M.D.   On: 09/17/2013 17:16    EKG Interpretation   None      Date: 09/17/2013  Rate: 118  Rhythm: sinus tachycardia  QRS Axis: normal  Intervals: normal  ST/T Wave abnormalities: normal  Conduction Disutrbances:none  Narrative Interpretation: no sig changes since EKG from 12/23/2010  Old EKG Reviewed: unchanged    MDM   1. DVT (deep venous thrombosis), bilateral   2. PE (pulmonary embolism)     Patient with bilateral DVT, extensive on right. He is well appearing and in NAD. Tachycardic. Cannot r/o PE despite asymptomatic (67 y/o, smoker, tachycardia). Labs pending- cbc, bmp, pt/inr, troponin. CT angio chest pending. EKG sinus tachy. Case discussed with attending Dr. Micheline Maze who also evaluated patient and agrees with plan of care. 5:51 PM [facility] positive for bilateral PE with mild to moderate clot burden. Lovenox ordered per pharmacy consult. Patient will be admitted, admission accepted by Dr. Elisabeth Pigeon, [facility].    Trevor Mace, PA-C 09/17/13 1752

## 2013-09-17 NOTE — ED Notes (Signed)
PT transported to CT>

## 2013-09-17 NOTE — Progress Notes (Signed)
Utilization Review completed.  Kelaiah Escalona RN CM  

## 2013-09-17 NOTE — Progress Notes (Signed)
Writer paged floor coverage to alert that pt has arrived to floor, Room 1426.

## 2013-09-17 NOTE — Progress Notes (Signed)
   CARE MANAGEMENT ED NOTE 09/17/2013  Patient:  Jeremy Herman, Jeremy Herman   Account Number:  [id number]  Date Initiated:  09/17/2013  Documentation initiated by:  Edd Arbour  Subjective/Objective Assessment:   67 yr old male medicare pt who did not have a pcp listed in EPIC c/o DVT     Subjective/Objective Assessment Detail:     Action/Plan:   Spoke with pt who states he prefers to be seen by the [facility] provide[facility] he recently saw prior to this admission at [facility] Eloisa [address], Earlene Plater   Action/Plan Detail:   Anticipated DC Date:       Status Recommendation to Physician:   Result of Recommendation:    Other ED Services  Consult Working Plan    DC Planning Services  Other  PCP issues    Choice offered to / List presented to:            Status of service:  Completed, signed off  ED Comments:   ED Comments Detail:

## 2013-09-18 DIAGNOSIS — I82409 Acute embolism and thrombosis of unspecified deep veins of unspecified lower extremity: Secondary | ICD-10-CM

## 2013-09-18 DIAGNOSIS — I2699 Other pulmonary embolism without acute cor pulmonale: Secondary | ICD-10-CM

## 2013-09-18 DIAGNOSIS — I517 Cardiomegaly: Secondary | ICD-10-CM

## 2013-09-18 LAB — LUPUS ANTICOAGULANT PANEL
DRVVT: 31.4 secs (ref <42.9)
Lupus Anticoagulant: NOT DETECTED
PTT Lupus Anticoagulant: 36.8 secs (ref 28.0–43.0)

## 2013-09-18 LAB — COMPREHENSIVE METABOLIC PANEL
ALT: 17 U/L (ref 0–53)
AST: 44 U/L — ABNORMAL HIGH (ref 0–37)
Albumin: 2.6 g/dL — ABNORMAL LOW (ref 3.5–5.2)
Alkaline Phosphatase: 72 U/L (ref 39–117)
Chloride: 105 mEq/L (ref 96–112)
GFR calc Af Amer: >90 mL/min (ref >90)
Potassium: 3.3 mEq/L — ABNORMAL LOW (ref 3.5–5.1)
Sodium: 138 mEq/L (ref 135–145)
Total Protein: 6.1 g/dL (ref 6.0–8.3)

## 2013-09-18 LAB — ANA: Anti Nuclear Antibody(ANA): POSITIVE — AB

## 2013-09-18 LAB — CBC
HCT: 34.7 % — ABNORMAL LOW (ref 39.0–52.0)
MCH: 38.3 pg — ABNORMAL HIGH (ref 26.0–34.0)
MCHC: 33.1 g/dL (ref 30.0–36.0)
MCV: 115.7 fL — ABNORMAL HIGH (ref 78.0–100.0)
Platelets: 193 10*3/uL (ref 150–400)
RDW: 14.8 % (ref 11.5–15.5)
WBC: 7.9 10*3/uL (ref 4.0–10.5)

## 2013-09-18 LAB — ANTI-NUCLEAR AB-TITER (ANA TITER): ANA Titer 1: 1:80 {titer} — ABNORMAL HIGH

## 2013-09-18 LAB — ANTI-DNA ANTIBODY, DOUBLE-STRANDED: ds DNA Ab: 5 IU/mL (ref <30)

## 2013-09-18 LAB — GLUCOSE, CAPILLARY: Glucose-Capillary: 103 mg/dL — ABNORMAL HIGH (ref 70–99)

## 2013-09-18 MED ORDER — POTASSIUM CHLORIDE CRYS ER 20 MEQ PO TBCR
40.0000 meq | EXTENDED_RELEASE_TABLET | Freq: Once | ORAL | Status: AC
  Administered 2013-09-18: 40 meq via ORAL
  Filled 2013-09-18: qty 2

## 2013-09-18 MED ORDER — RIVAROXABAN 20 MG PO TABS: 20.0000 mg | ORAL_TABLET | Freq: Every day | ORAL | Status: DC

## 2013-09-18 MED ORDER — RIVAROXABAN 15 MG PO TABS
15.0000 mg | ORAL_TABLET | Freq: Two times a day (BID) | ORAL | Status: DC
  Administered 2013-09-18: 12:00:00 15 mg via ORAL
  Filled 2013-09-18 (×3): qty 1

## 2013-09-18 MED ORDER — HYDROCODONE-ACETAMINOPHEN 5-325 MG PO TABS: 1.0000 | ORAL_TABLET | ORAL | Status: DC | PRN

## 2013-09-18 MED ORDER — RIVAROXABAN 15 MG PO TABS: 15.0000 mg | ORAL_TABLET | Freq: Two times a day (BID) | ORAL | Status: DC

## 2013-09-18 NOTE — Progress Notes (Signed)
Echocardiogram 2D Echocardiogram has been performed.  Jeremy Herman 09/18/2013, 8:57 AM

## 2013-09-18 NOTE — Discharge Summary (Signed)
Physician Discharge Summary  Jeremy Herman RUE:[id number] DOB: 07-03-1946 DOA: 09/17/2013  PCP: Provider Not In System  Admit date: 09/17/2013 Discharge date: 09/18/2013  Time spent: 45 minutes  Recommendations for Outpatient Follow-up:  -Will be discharged home today. -Advised to follow up with PCP in 2 weeks. -Will need remainder of Xarelto prescription following the first 21 days.   Discharge Diagnoses:  Principal Problem:   Bilateral pulmonary embolism Active Problems:   DVT, bilateral lower limbs   Discharge Condition: Stable and improved  Filed Weights   09/17/13 1729 09/18/13 0510  Weight: 80.74 kg (178 lb) 80.75 kg (178 lb 0.3 oz)    History of present illness:  67 year old male with no significant past medical history who presented initially to urgent care for evaluation of left lower extremity swelling and was subsequently found to have bilateral LE DVT. He was sent to ED for further evaluation. Patient did not have complaints of chest pain, shortness of breath or palpitations. No abdominal pain, no nausea or vomiting. No history of trauma to left lower extremity.  In ED, vitals were stable with BP 123/76, HR 110 and T max 98.8 F, O2 saturation of 95% on room air. CT chest angio was significant for bilateral pulmonary embolism. Pt was started on Lovenox in ED. We were asked to admit him for further evaluation and management.   Hospital Course:   DVT/PE -Only symptom has been RLE swelling. No CP/SOB/hypoxemia. PE was found incidentally on CT. -Different forms of anticoagulation have been discussed with the patient and he has elected to use Xarelto. -Will give him the first dose in the hospital and DC lovenox and coumadin. -He has been advised to follow up with his PCP for remaining lovenox prescription following the first 21 days.  Leukocytosis -Resolved. -Likely reactive.  Procedures:  none   Consultations:  None  Discharge Instructions  Discharge  Orders   Future Orders Complete By Expires   Discontinue IV  As directed    Increase activity slowly  As directed        Medication List    STOP taking these medications       aspirin 81 MG chewable tablet      TAKE these medications       acetaminophen 325 MG tablet  Commonly known as:  TYLENOL  Take 650 mg by mouth every 6 (six) hours as needed for pain (pain).     HYDROcodone-acetaminophen 5-325 MG per tablet  Commonly known as:  NORCO/VICODIN  Take 1-2 tablets by mouth every 4 (four) hours as needed.     multivitamin with minerals tablet  Take 1 tablet by mouth daily.     Rivaroxaban 15 MG Tabs tablet  Commonly known as:  XARELTO  Take 1 tablet (15 mg total) by mouth 2 (two) times daily with a meal.       Allergies  Allergen Reactions  . Fish Allergy     diarrhea       Follow-up Information   Follow up with Provider Not In System. Schedule an appointment as soon as possible for a visit in 2 weeks. (With your regular provider)        The results of significant diagnostics from this hospitalization (including imaging, microbiology, ancillary and laboratory) are listed below for reference.    Significant Diagnostic Studies: Ct Angio Chest Pe W/cm &/or Wo Cm  09/17/2013   CLINICAL DATA:  DVT right leg.  EXAM: CT ANGIOGRAPHY CHEST WITH CONTRAST  TECHNIQUE:  Multidetector CT imaging of the chest was performed using the standard protocol during bolus administration of intravenous contrast. Multiplanar CT image reconstructions including MIPs were obtained to evaluate the vascular anatomy.  CONTRAST:  OMNIPAQUE IOHEXOL 350 MG/ML SOLN  COMPARISON:  CT chest 12/23/2010  FINDINGS: Positive for bilateral pulmonary emboli. Right middle lobe, right lower lobe, and left lower lobe emboli are present. Mild to moderate clot burden overall.  Extensive coronary artery calcification. Heart size is normal. Negative for aortic dissection or aneurysm.  Scarring in the superior  segment right lower lobe compatible with healing infection. Cavitary mass lesion seen in this area previously has resolved. There is patchy scarring in the right middle lobe and right lower lobe laterally in areas of previous pneumonia. Mild scarring in the left lung base.  Negative for mass or adenopathy. No pleural effusion.  Review of the MIP images confirms the above findings.  IMPRESSION: Positive for bilateral pulmonary emboli. Mild to moderate clot burden.  Coronary artery calcification  Bilateral scarring especially the right lower lobe where there was a cavitary abscess on the prior CT.  Critical Value/emergent results were called by telephone at the time of interpretation on 09/17/2013 at 5:15 PM to [facility] , who verbally acknowledged these results.   Electronically Signed   By: Marlan Palau M.D.   On: 09/17/2013 17:16    Microbiology: No results found for this or any previous visit (from the past 240 hour(s)).   Labs: Basic Metabolic Panel:  Recent Labs Lab 09/17/13 1533 09/17/13 1955 09/18/13 0516  NA 136 134* 138  K 3.5 3.2* 3.3*  CL 102 102 105  CO2 22 24 24   GLUCOSE 112* 102* 95  BUN 12 10 11   CREATININE 0.58 0.45* 0.53  CALCIUM 9.2 9.1 8.6  MG  --  1.4*  --   PHOS  --  2.9  --    Liver Function Tests:  Recent Labs Lab 09/17/13 1955 09/18/13 0516  AST 58* 44*  ALT 21 17  ALKPHOS 85 72  BILITOT 1.1 0.9  PROT 7.0 6.1  ALBUMIN 3.0* 2.6*   No results found for this basename: LIPASE, AMYLASE,  in the last 168 hours No results found for this basename: AMMONIA,  in the last 168 hours CBC:  Recent Labs Lab 09/17/13 1533 09/17/13 1955 09/18/13 0516  WBC 11.6* 11.1* 7.9  NEUTROABS  --  7.7  --   HGB 13.7 13.7 11.5*  HCT 39.7 39.7 34.7*  MCV 114.1* 115.1* 115.7*  PLT 223 228 193   Cardiac Enzymes: No results found for this basename: CKTOTAL, CKMB, CKMBINDEX, TROPONINI,  in the last 168 hours BNP: BNP (last 3 results) No results found for this  basename: PROBNP,  in the last 8760 hours CBG:  Recent Labs Lab 09/18/13 0745  GLUCAP 103*       Signed:  HERNANDEZ ACOSTA,ESTELA  [facility] Pager: [phone] 09/18/2013, 9:19 AM

## 2013-09-18 NOTE — Progress Notes (Signed)
Coumadin book given.

## 2014-07-08 ENCOUNTER — Inpatient Hospital Stay (HOSPITAL_COMMUNITY)
Admission: EM | Admit: 2014-07-08 | Discharge: 2014-07-09 | DRG: 176 | Disposition: A | Discharge status: Home / Self Care | Payer: Medicare Other | Attending: Internal Medicine | Admitting: Internal Medicine

## 2014-07-08 ENCOUNTER — Encounter (HOSPITAL_COMMUNITY): Payer: Self-pay | Admitting: Emergency Medicine

## 2014-07-08 ENCOUNTER — Emergency Department (HOSPITAL_COMMUNITY): Payer: Medicare Other

## 2014-07-08 DIAGNOSIS — Z9119 Patient's noncompliance with other medical treatment and regimen: Secondary | ICD-10-CM

## 2014-07-08 DIAGNOSIS — I2789 Other specified pulmonary heart diseases: Secondary | ICD-10-CM | POA: Diagnosis present

## 2014-07-08 DIAGNOSIS — I2699 Other pulmonary embolism without acute cor pulmonale: Principal | ICD-10-CM | POA: Diagnosis present

## 2014-07-08 DIAGNOSIS — I2782 Chronic pulmonary embolism: Secondary | ICD-10-CM | POA: Diagnosis present

## 2014-07-08 DIAGNOSIS — I82402 Acute embolism and thrombosis of unspecified deep veins of left lower extremity: Secondary | ICD-10-CM

## 2014-07-08 DIAGNOSIS — I82509 Chronic embolism and thrombosis of unspecified deep veins of unspecified lower extremity: Secondary | ICD-10-CM | POA: Diagnosis present

## 2014-07-08 DIAGNOSIS — Q211 Atrial septal defect: Secondary | ICD-10-CM

## 2014-07-08 DIAGNOSIS — I82409 Acute embolism and thrombosis of unspecified deep veins of unspecified lower extremity: Secondary | ICD-10-CM | POA: Diagnosis present

## 2014-07-08 DIAGNOSIS — Z91199 Patient's noncompliance with other medical treatment and regimen due to unspecified reason: Secondary | ICD-10-CM

## 2014-07-08 DIAGNOSIS — I272 Pulmonary hypertension, unspecified: Secondary | ICD-10-CM

## 2014-07-08 DIAGNOSIS — F17200 Nicotine dependence, unspecified, uncomplicated: Secondary | ICD-10-CM | POA: Diagnosis present

## 2014-07-08 DIAGNOSIS — Z66 Do not resuscitate: Secondary | ICD-10-CM | POA: Diagnosis present

## 2014-07-08 DIAGNOSIS — M7989 Other specified soft tissue disorders: Secondary | ICD-10-CM

## 2014-07-08 DIAGNOSIS — IMO0001 Reserved for inherently not codable concepts without codable children: Secondary | ICD-10-CM

## 2014-07-08 DIAGNOSIS — I82403 Acute embolism and thrombosis of unspecified deep veins of lower extremity, bilateral: Secondary | ICD-10-CM

## 2014-07-08 DIAGNOSIS — Q2111 Secundum atrial septal defect: Secondary | ICD-10-CM

## 2014-07-08 DIAGNOSIS — Q219 Congenital malformation of cardiac septum, unspecified: Secondary | ICD-10-CM

## 2014-07-08 DIAGNOSIS — I82401 Acute embolism and thrombosis of unspecified deep veins of right lower extremity: Secondary | ICD-10-CM

## 2014-07-08 HISTORY — DX: Unspecified osteoarthritis, unspecified site: M19.90

## 2014-07-08 HISTORY — DX: Acute embolism and thrombosis of unspecified deep veins of unspecified lower extremity: I82.409

## 2014-07-08 LAB — CBC
HEMATOCRIT: 37.9 % — AB (ref 39.0–52.0)
HEMOGLOBIN: 13.4 g/dL (ref 13.0–17.0)
MCH: 39.4 pg — ABNORMAL HIGH (ref 26.0–34.0)
MCHC: 35.4 g/dL (ref 30.0–36.0)
MCV: 111.5 fL — ABNORMAL HIGH (ref 78.0–100.0)
Platelets: 164 10*3/uL (ref 150–400)
RBC: 3.4 MIL/uL — ABNORMAL LOW (ref 4.22–5.81)
RDW: 14.7 % (ref 11.5–15.5)
WBC: 9.5 10*3/uL (ref 4.0–10.5)

## 2014-07-08 LAB — BASIC METABOLIC PANEL
Anion gap: 15 (ref 5–15)
BUN: 14 mg/dL (ref 6–23)
CO2: 22 mEq/L (ref 19–32)
CREATININE: 0.58 mg/dL (ref 0.50–1.35)
Calcium: 9.2 mg/dL (ref 8.4–10.5)
Chloride: 101 mEq/L (ref 96–112)
GLUCOSE: 123 mg/dL — AB (ref 70–99)
Potassium: 4.8 mEq/L (ref 3.7–5.3)
Sodium: 138 mEq/L (ref 137–147)

## 2014-07-08 LAB — TROPONIN I: Troponin I: <0.3 ng/mL (ref <0.30)

## 2014-07-08 LAB — PROTIME-INR
INR: 0.99 (ref 0.00–1.49)
Prothrombin Time: 13.1 seconds (ref 11.6–15.2)

## 2014-07-08 MED ORDER — IOHEXOL 350 MG/ML SOLN
100.0000 mL | Freq: Once | INTRAVENOUS | Status: AC | PRN
  Administered 2014-07-08: 100 mL via INTRAVENOUS

## 2014-07-08 MED ORDER — NICOTINE 21 MG/24HR TD PT24
21.0000 mg | MEDICATED_PATCH | Freq: Every day | TRANSDERMAL | Status: DC
  Filled 2014-07-08: qty 1

## 2014-07-08 MED ORDER — ENOXAPARIN SODIUM 80 MG/0.8ML ~~LOC~~ SOLN
1.0000 mg/kg | Freq: Once | SUBCUTANEOUS | Status: AC
  Administered 2014-07-08: 80 mg via SUBCUTANEOUS
  Filled 2014-07-08: qty 0.8

## 2014-07-08 MED ORDER — METOPROLOL TARTRATE 12.5 MG HALF TABLET
12.5000 mg | ORAL_TABLET | Freq: Two times a day (BID) | ORAL | Status: DC
  Administered 2014-07-08 – 2014-07-09 (×3): 12.5 mg via ORAL
  Filled 2014-07-08 (×4): qty 1

## 2014-07-08 MED ORDER — HEPARIN (PORCINE) IN NACL 100-0.45 UNIT/ML-% IJ SOLN
1400.0000 [IU]/h | INTRAMUSCULAR | Status: DC
  Administered 2014-07-08: 1400 [IU]/h via INTRAVENOUS
  Filled 2014-07-08: qty 250

## 2014-07-08 NOTE — ED Provider Notes (Signed)
CSN: [id number]     Arrival date & time 07/08/14  1104 History   First MD Initiated Contact with Patient 07/08/14 1116     Chief Complaint  Patient presents with  . Leg Swelling    left      HPI Patient has a history of DVT and pulmonary embolism presents with new swelling of his left lower extremity worsening over the past 6 days.  He previously was diagnosed with a DVT and pulmonary emboli in October 2014.  At that time he was prescribed a 21 day course of xarelto after admission to the hospital and told to followup with his primary care physician.  He never followed up with his primary care physician and never received any more anticoagulants.  He completed a 21 day course of xarelto.  At that time he had right leg swelling he reports that his right leg swelling improved.  No history of cancer.  No recent weight changes.  Denies chest pain or shortness of breath.   Past Medical History  Diagnosis Date  . Medical history non-contributory   . DVT (deep venous thrombosis)   . Arthritis    Past Surgical History  Procedure Laterality Date  . Ankle surgery  1979    right ankle   No family history on file. History  Substance Use Topics  . Smoking status: Current Every Day Smoker -- 1.00 packs/day for 43 years    Types: Cigarettes  . Smokeless tobacco: Never Used  . Alcohol Use: No     Comment: quit in 2001    Review of Systems  All other systems reviewed and are negative.     Allergies  Fish allergy  Home Medications   Prior to Admission medications   Medication Sig Start Date End Date Taking? Authorizing Provider  ibuprofen (ADVIL,MOTRIN) 200 MG tablet Take 400 mg by mouth every 6 (six) hours as needed for moderate pain.   Yes Historical Provider, MD   BP 139/77  Pulse 141  Temp(Src) 97.7 F (36.5 C) (Oral)  Resp 18  Wt 180 lb (81.647 kg)  SpO2 97% Physical Exam  Nursing note and vitals reviewed. Constitutional: He is oriented to person, place, and time. He  appears well-developed and well-nourished.  HENT:  Head: Normocephalic and atraumatic.  Eyes: EOM are normal.  Neck: Normal range of motion.  Cardiovascular: Normal rate, regular rhythm, normal heart sounds and intact distal pulses.   Pulmonary/Chest: Effort normal and breath sounds normal. No respiratory distress.  Abdominal: Soft. He exhibits no distension. There is no tenderness.  Musculoskeletal: Normal range of motion.  Significantly swollen left lower extremity as compared to right. no Erythema of the left lower extremity.  Neurological: He is alert and oriented to person, place, and time.  Skin: Skin is warm and dry.  Psychiatric: He has a normal mood and affect. Judgment normal.    ED Course  Procedures (including critical care time)  CRITICAL CARE Performed by: Lyanne Co Total critical care time: 35 Critical care time was exclusive of separately billable procedures and treating other patients. Critical care was necessary to treat or prevent imminent or life-threatening deterioration. Critical care was time spent personally by me on the following activities: development of treatment plan with patient and/or surrogate as well as nursing, discussions with consultants, evaluation of patient's response to treatment, examination of patient, obtaining history from patient or surrogate, ordering and performing treatments and interventions, ordering and review of laboratory studies, ordering and review of radiographic studies,  pulse oximetry and re-evaluation of patient's condition.   Labs Review Labs Reviewed  CBC - Abnormal; Notable for the following:    RBC 3.40 (*)    HCT 37.9 (*)    MCV 111.5 (*)    MCH 39.4 (*)    All other components within normal limits  BASIC METABOLIC PANEL - Abnormal; Notable for the following:    Glucose, Bld 123 (*)    All other components within normal limits  TROPONIN I    Imaging Review Ct Angio Chest W/cm &/or Wo Cm  07/08/2014    CLINICAL DATA:  LEFT leg swelling worsening since last Wednesday. Groin pain. History of pulmonary embolism and deep venous thrombosis.  EXAM: CT ANGIOGRAPHY CHEST WITH CONTRAST  TECHNIQUE: Multidetector CT imaging of the chest was performed using the standard protocol during bolus administration of intravenous contrast. Multiplanar CT image reconstructions and MIPs were obtained to evaluate the vascular anatomy.  CONTRAST:  OMNIPAQUE IOHEXOL 350 MG/ML SOLN  COMPARISON:  08/28/2013.  FINDINGS: Bones: Exaggerated thoracic kyphosis with ankylosis extending from T3 through T12. Ossification of the discs is present. Ankylosis appears to be due to diffuse idiopathic skeletal hyperostosis. 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 There are no aggressive osseous lesions.  Cardiovascular: Chronic LEFT lower lobe pulmonary embolism is present, which is adherent to the vessel wall. There is also chronic RIGHT lower lobe pulmonary embolism, again adherent to the vessel wall. Interval development of pulmonary embolism to the lingula, which may be acute or subacute. There is also an acute or subacute pulmonary embolism to the RIGHT lower lobe.  There is an increase in the clot burden in the LEFT pulmonary artery, most evident at the lingular and LEFT lower lobe bifurcation. 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 No evidence of RIGHT heart strain.  Aortic atherosclerosis without an acute aortic abnormality.Coronary artery atherosclerosis is present. If office based assessment of coronary risk factors has not been performed, it is now recommended.  Lungs: Areas of pulmonary parenchymal scarring are present with some areas of nodularity. There is at crescent-shaped area of nodularity in the inferior LEFT upper lobe adjacent to the major fissure (image 46 series 7). This likely  represents interval pulmonary infarction associated with pulmonary emboli. There is no pneumonia.  Central airways: Tenacious secretions are present within the trachea.  Effusions: None.  Lymphadenopathy: No axillary adenopathy. No mediastinal or hilar adenopathy.  Esophagus: Normal.  Upper abdomen: Within normal limits.  Other: None.  Review of the MIP images confirms the above findings.  IMPRESSION: 1. Increase in clot burden associated with chronic pulmonary emboli with superimposed acute or subacute emboli. 2. Critical Value/emergent results were called by telephone at the time of interpretation on 07/08/2014 at 2:46 pm to Dr. Azalia Bilis , who verbally acknowledged these results. Patient also has deep venous thrombosis. 3. 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 4. Crescentic nodular density in the inferior LEFT upper lobe is evident on scout images. This is most compatible with pulmonary infarct in this patient with pulmonary emboli however radiographic followup to ensure progression to scarring is recommended. 5. Scattered areas of pulmonary parenchymal scarring associated with prior pulmonary infarcts.   Electronically Signed   By: Andreas Newport M.D.   On: 07/08/2014 14:48  I personally reviewed the imaging tests through PACS system I reviewed available ER/hospitalization records through the EMR    EKG  Interpretation None      MDM   Final diagnoses:  Pulmonary emboli  DVT, lower extremity, left  DVT, lower extremity, right    Patient with large new acute left lower extremity DVT.  A ongoing subacute right thrombus in his right lower extremity.  Patient with ongoing large clot burden in his lungs much of which is chronic but there appears to be some acute clot as well.  Evidence of a pulmonary infarct.  Patient will benefit from admission the hospital given his pulmonary embolism severity index score.     Lyanne CoKevin M  Maveryk Renstrom, MD 07/08/14 [phone]

## 2014-07-08 NOTE — Progress Notes (Signed)
*  Preliminary Results* Bilateral lower extremity venous duplex completed. The right lower extremity is positive for subacute to chronic deep and superficial vein thrombosis involving the right common femoral, femoral, profunda femoral, popliteal, posterior tibial, peroneal greater saphenous, and lesser saphenous veins. The left lower extremity is positive for acute deep vein thrombosis involving the left saphenofemoral junction, common femoral, femoral, profunda femoral, and popliteal veins. There is no evidence of Baker's cyst bilaterally.  Preliminary results discussed with Dr.Campos.  07/08/2014  Gertie FeyMichelle Kristinia Herman, RVT, RDCS, RDMS

## 2014-07-08 NOTE — Progress Notes (Signed)
ANTICOAGULATION CONSULT NOTE - Initial Consult  Pharmacy Consult for Heparin Indication: pulmonary embolus, DVT  Allergies  Allergen Reactions  . Fish Allergy Other (See Comments)    Diarrhea     Patient Measurements: Height: 5\' 11"  (180.3 cm) Weight: 180 lb (81.647 kg) IBW/kg (Calculated) : 75.3 Heparin Dosing Weight: 81.6 kg  Vital Signs: Temp: 97.7 F (36.5 C) (08/11 1413) Temp src: Oral (08/11 1413) BP: 123/79 mmHg (08/11 1550) Pulse Rate: 137 (08/11 1550)  Labs:  Recent Labs  07/08/14 1214  HGB 13.4  HCT 37.9*  PLT 164  CREATININE 0.58  TROPONINI <0.30    Estimated Creatinine Clearance: 95.4 ml/min (by C-G formula based on Cr of 0.58).   Medical History: Past Medical History  Diagnosis Date  . Medical history non-contributory   . DVT (deep venous thrombosis)   . Arthritis     Medications:  Scheduled:  . metoprolol tartrate  12.5 mg Oral BID  . 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  Will begin IV heparin after completion of ~ 75% of Lovenox dosing interval (i.e. 9 hr after lovenox given)  At 21:00 will begin IV heparin @ 1400 units/hr  Check heparin level 6 hr after heparin started  Follow daily heparin level & CBC while on IV heparin  Felicity Penix, Joselyn GlassmanLeann Trefz, PharmD 07/08/2014,4:12 PM

## 2014-07-08 NOTE — ED Provider Notes (Signed)
Date: 07/08/2014  Rate: 145  Rhythm: sinus tachycardia  QRS Axis: normal  Intervals: QT prolonged  ST/T Wave abnormalities: indeterminate  Conduction Disutrbances:none  Narrative Interpretation: Sinus tachycardia with concern for pulm htn  Old EKG Reviewed: no significant change    Mirian MoMatthew Gentry, MD 07/08/14 1533

## 2014-07-08 NOTE — ED Notes (Signed)
Initial contact-A&Ox4. Ambulatory and moving all extremities. Left extremity swollen, 3+ pitting edema noted on entire leg and foot. Pulses audible with doppler (femoral, popliteal and pedal). No c/o pain but reports "it feels sensitive on the inside of my upper leg." Denies numbness, tingling, or loss of sensation. Not currently taking blood thinners. 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 No other PMH. In NAD. Awaiting MD.

## 2014-07-08 NOTE — ED Notes (Signed)
Patient transported to CT 

## 2014-07-08 NOTE — ED Notes (Signed)
Ultrasound bedside.

## 2014-07-08 NOTE — H&P (Addendum)
[facility] History and Physical  Jeremy PaganiniJames Herman ZOX:[id number] DOB: Feb 04, 1946 DOA: 07/08/2014  Referring physician: ED PCP: PROVIDER NOT IN SYSTEM  Specialists: Cardiology, Pulmonary  Chief Complaint: Conley RollsLe swelling  HPI: Jeremy Herman is a 68 y.o. male [facility] with subacute LLE swelling since 07/02/14.  He didn't think too much about the seventh 10 and felt that this will go down Over the past 2 days he's had increasing swelling in left lower extremity and decided to seek medical attention. His history significant for admission 09/18/13 for DVT and PE with only symptom at that time being right lower extremity emboli. He was started on Lovenox at that time and transition to Zoloft but only to 21 days treatment and it is unclear as to whether you was not prescribed appropriate amounts nevertheless he did not follow up with his PCP and this was not prescribed subsequently. 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  This was never repaired He actually has no shortness of breath fever chills or any other constitutional symptoms. He's pretty healthy diet overall. He has no personal history of cancer No nausea no vomiting no dark stool no prior stool no rash no chills no fever He states he wants to go home  Emergency room evaluation revealed bilateral lower extremity DVTs-right-sided subacute to chronic left involving most of the vasculature and left lower extremity positive for acute at saphenofemoral junction.  CT angiogram of the chest showed increased clot burden associated with chronic emboli and superimposed acute as well as pulmonary arterial hypertension and pulmonary infarct left upper lobe.  Review of Systems: Please see above for constitutional review  Past Medical History  Diagnosis Date  . Medical history non-contributory   . DVT (deep venous thrombosis)   . Arthritis     Past Surgical History  Procedure Laterality Date  . Ankle surgery  1979    right ankle   Social History:  History   Social History Narrative  . No narrative on file    Allergies  Allergen Reactions  . Fish Allergy Other (See Comments)    Diarrhea     Family History  Problem Relation Age of Onset  . Stomach cancer Father   . Aneurysm      Prior to Admission medications   Medication Sig Start Date End Date Taking? Authorizing Provider  ibuprofen (ADVIL,MOTRIN) 200 MG tablet Take 400 mg by mouth every 6 (six) hours as needed for moderate pain.   Yes Historical Provider, MD   Physical Exam: Filed Vitals:   07/08/14 1111 07/08/14 1155 07/08/14 1413  BP: 132/74  139/77  Pulse: 116  141  Temp: 98.4 F (36.9 C)  97.7 F (36.5 C)  TempSrc: Oral  Oral  Resp: 17  18  Weight:  81.647 kg (180 lb)   SpO2: 96%  97%     General:  Alert frail oriented, spider veins bilaterally over maxillary  Eyes: Mild pallor no icterus  ENT: Soft supple no JVD noted  Neck: No thyromegaly no JVD no bruits  Cardiovascular: S1-S2 tachycardic seems regular  Respiratory: Clinically clear no added sound  Abdomen: Soft nontender nondistended  Skin: Grossly distended left lower extremity about twice as of right  Musculoskeletal: Range of motion intact  Psychiatric: Euthymic pleasant  Neurologic: Grossly intact  Labs on Admission:  Basic Metabolic Panel:  Recent Labs Lab 07/08/14 1214  NA 138  K 4.8  CL 101  CO2 22  GLUCOSE 123*  BUN 14  CREATININE 0.58  CALCIUM 9.2   Liver Function Tests: No results found for this basename: AST, ALT, ALKPHOS, BILITOT, PROT, ALBUMIN,  in the last 168 hours No results found for this basename: LIPASE, AMYLASE,  in the last 168 hours No results found for this basename: AMMONIA,  in the last 168 hours CBC:  Recent Labs Lab 07/08/14 1214  WBC 9.5  HGB 13.4  HCT 37.9*  MCV 111.5*  PLT 164   Cardiac Enzymes:  Recent Labs Lab  07/08/14 1214  TROPONINI <0.30    BNP (last 3 results) No results found for this basename: PROBNP,  in the last 8760 hours CBG: No results found for this basename: GLUCAP,  in the last 168 hours  Radiological Exams on Admission: Ct Angio Chest W/cm &/or Wo Cm  07/08/2014   CLINICAL DATA:  LEFT leg swelling worsening since last Wednesday. Groin pain. History of pulmonary embolism and deep venous thrombosis.  EXAM: CT ANGIOGRAPHY CHEST WITH CONTRAST  TECHNIQUE: Multidetector CT imaging of the chest was performed using the standard protocol during bolus administration of intravenous contrast. Multiplanar CT image reconstructions and MIPs were obtained to evaluate the vascular anatomy.  CONTRAST:  OMNIPAQUE IOHEXOL 350 MG/ML SOLN  COMPARISON:  08/28/2013.  FINDINGS: Bones: Exaggerated thoracic kyphosis with ankylosis extending from T3 through T12. Ossification of the discs is present. Ankylosis appears to be due to diffuse idiopathic skeletal hyperostosis. 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 There are no aggressive osseous lesions.  Cardiovascular: Chronic LEFT lower lobe pulmonary embolism is present, which is adherent to the vessel wall. There is also chronic RIGHT lower lobe pulmonary embolism, again adherent to the vessel wall. Interval development of pulmonary embolism to the lingula, which may be acute or subacute. There is also an acute or subacute pulmonary embolism to the RIGHT lower lobe.  There is an increase in the clot burden in the LEFT pulmonary artery, most evident at the lingular and LEFT lower lobe bifurcation. 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 No evidence of RIGHT heart strain.  Aortic atherosclerosis without an acute aortic abnormality.Coronary artery atherosclerosis is present. If office based assessment of coronary risk factors  has not been performed, it is now recommended.  Lungs: Areas of pulmonary parenchymal scarring are present with some areas of nodularity. There is at crescent-shaped area of nodularity in the inferior LEFT upper lobe adjacent to the major fissure (image 46 series 7). This likely represents interval pulmonary infarction associated with pulmonary emboli. There is no pneumonia.  Central airways: Tenacious secretions are present within the trachea.  Effusions: None.  Lymphadenopathy: No axillary adenopathy. No mediastinal or hilar adenopathy.  Esophagus: Normal.  Upper abdomen: Within normal limits.  Other: None.  Review of the MIP images confirms the above findings.  IMPRESSION: 1. Increase in clot burden associated with chronic pulmonary emboli with superimposed acute or subacute emboli. 2. Critical Value/emergent results were called by telephone at the time of interpretation on 07/08/2014 at 2:46 pm to Dr. Azalia Bilis , who verbally acknowledged these results. Patient also has deep venous thrombosis. 3. 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 4. Crescentic nodular density in the inferior LEFT upper lobe is evident on scout images. This is most compatible with pulmonary infarct in this patient with pulmonary emboli however  radiographic followup to ensure progression to scarring is recommended. 5. Scattered areas of pulmonary parenchymal scarring associated with prior pulmonary infarcts.   Electronically Signed   By: Andreas Newport M.D.   On: 07/08/2014 14:48    EKG: Independently reviewed. None performed as yet  Assessment/Plan Principal Problem:   Pulmonary emboli-I will start him on heparin IV as he may need this procedures-will consult cardiology as well as pulmonology for input.  I think he would benefit from a [place] filter to prevent further clots and prevent progression of pulmonary arterial hypertension but I'm not sure if he is  willing to do this discuss some of the risks and benefits. We will ask pulmonary to put in their recommendations. He may be able to eventually transition to Xarelto Active Problems:   Sinus tach-start metoprolol low dose for now.  Monitor.    Pulmonary hypertension-monitor await pulmonary input   Smoker-advised that he should quit-place him on nicotine patch 21 mcg   Heart septum, LARGe ASD defect 0n 2012 echo 2.3 x 2.4 cm-repeat echo today-spoke with Dr. Anne Fu of Cardiology-They will set-up with Dr. Excell Seltzer structural Heart disease MD soon-likely not candidate for closure in setting acute PE  Code Status:  DO NOT RESUSCITATE  Family Communication:  Bedside  Disposition Plan:  Patient is stable for telemetry   Time spent:  55  Mahala Menghini [facility] Pager [phone]  If 7PM-7AM, please contact night-coverage [email or web address] Password [facility] 07/08/2014, 3:26 PM

## 2014-07-08 NOTE — ED Notes (Signed)
Pt c/o left leg swelling that has gradually gotten worse since last Wed. Pt left leg remotely larger in appearance than his right leg upon assessment in triage.  Pt does c/o groin pain when he bears weight on left leg. Pmh pf DVT right leg. Pt not currently taking any anticoagulants.

## 2014-07-08 NOTE — ED Notes (Signed)
Upon returning from CT, pt was shaking. Asked pt if he was cold and needed a Blanket and Pt stated he was nervous. Will notify RN of Pulse rate

## 2014-07-09 DIAGNOSIS — F17200 Nicotine dependence, unspecified, uncomplicated: Secondary | ICD-10-CM

## 2014-07-09 DIAGNOSIS — I2699 Other pulmonary embolism without acute cor pulmonale: Principal | ICD-10-CM

## 2014-07-09 DIAGNOSIS — I82409 Acute embolism and thrombosis of unspecified deep veins of unspecified lower extremity: Secondary | ICD-10-CM | POA: Insufficient documentation

## 2014-07-09 DIAGNOSIS — I2789 Other specified pulmonary heart diseases: Secondary | ICD-10-CM

## 2014-07-09 DIAGNOSIS — I369 Nonrheumatic tricuspid valve disorder, unspecified: Secondary | ICD-10-CM

## 2014-07-09 LAB — HEPARIN LEVEL (UNFRACTIONATED)
HEPARIN UNFRACTIONATED: 0.38 [IU]/mL (ref 0.30–0.70)
Heparin Unfractionated: 0.5 IU/mL (ref 0.30–0.70)

## 2014-07-09 LAB — CBC
HEMATOCRIT: 33.8 % — AB (ref 39.0–52.0)
Hemoglobin: 11.7 g/dL — ABNORMAL LOW (ref 13.0–17.0)
MCH: 38.9 pg — ABNORMAL HIGH (ref 26.0–34.0)
MCHC: 34.6 g/dL (ref 30.0–36.0)
MCV: 112.3 fL — ABNORMAL HIGH (ref 78.0–100.0)
Platelets: 161 10*3/uL (ref 150–400)
RBC: 3.01 MIL/uL — AB (ref 4.22–5.81)
RDW: 14.6 % (ref 11.5–15.5)
WBC: 8.2 10*3/uL (ref 4.0–10.5)

## 2014-07-09 MED ORDER — HEPARIN (PORCINE) IN NACL 100-0.45 UNIT/ML-% IJ SOLN
1500.0000 [IU]/h | INTRAMUSCULAR | Status: DC
  Filled 2014-07-09 (×2): qty 250

## 2014-07-09 MED ORDER — TRAMADOL HCL 50 MG PO TABS: 50.0000 mg | ORAL_TABLET | Freq: Four times a day (QID) | ORAL | Status: DC | PRN

## 2014-07-09 MED ORDER — NICOTINE 21 MG/24HR TD PT24: 21.0000 mg | MEDICATED_PATCH | Freq: Every day | TRANSDERMAL | Status: DC

## 2014-07-09 MED ORDER — RIVAROXABAN 20 MG PO TABS: 20.0000 mg | ORAL_TABLET | Freq: Every day | ORAL | Status: DC

## 2014-07-09 MED ORDER — RIVAROXABAN 15 MG PO TABS
15.0000 mg | ORAL_TABLET | ORAL | Status: AC
  Administered 2014-07-09: 15 mg via ORAL
  Filled 2014-07-09: qty 1

## 2014-07-09 NOTE — Progress Notes (Addendum)
ANTICOAGULATION CONSULT NOTE - F/U Consult  Pharmacy Consult for Heparin Indication: pulmonary embolus, DVT  Allergies  Allergen Reactions  . Fish Allergy Other (See Comments)    Diarrhea     Patient Measurements: Height: 6' (182.9 cm) Weight: 165 lb 5.5 oz (75 kg) IBW/kg (Calculated) : 77.6 Heparin Dosing Weight: 81.6 kg  Vital Signs: Temp: 98.8 F (37.1 C) (08/12 0507) Temp src: Oral (08/12 0507) BP: 110/67 mmHg (08/12 1107) Pulse Rate: 98 (08/12 1107)  Labs:  Recent Labs  07/08/14 1214 07/08/14 1547 07/09/14 0239 07/09/14 1046  HGB 13.4  --  11.7*  --   HCT 37.9*  --  33.8*  --   PLT 164  --  161  --   LABPROT  --  13.1  --   --   INR  --  0.99  --   --   HEPARINUNFRC  --   --  0.38 0.50  CREATININE 0.58  --   --   --   TROPONINI <0.30  --   --   --     Estimated Creatinine Clearance: 95.1 ml/min (by C-G formula based on Cr of 0.58).   Medical History: Past Medical History  Diagnosis Date  . Medical history non-contributory   . DVT (deep venous thrombosis)   . Arthritis     Medications:  Scheduled:  . metoprolol tartrate  12.5 mg Oral BID  . nicotine  21 mg Transdermal Daily   Infusions:  . heparin 1,500 Units/hr (07/09/14 0432)    Assessment:  [age] yr male with complaint of lower extremity swelling  H/O, ASD defect DVT/PE October 2014 but only completed 21 days of Xarelto  CTAngio = Increased clot burden associated with chronic PE with acute/subacute emboli; + DVT  Patient received Lovenox 80mg  sq dose x 1 in ED @ 12:18 8/11.  Heparin level this morning remains therapeutic after increase in rate.    No IV interuptions/bleeding per RN  Noted plans for possible transition to Xarelto soon.    Goal of Therapy:  Heparin level 0.3-0.7 units/ml Monitor platelets by anticoagulation protocol: Yes   Plan:   Continue heparin drip at 1500 units/hr  Recheck HL in 6 hours.   Follow daily heparin level & CBC while on IV heparin  F/u oral  anticoagulation plans and education  Haynes Hoehnolleen Klark Vanderhoef, PharmD, BCPS 07/09/2014, 11:28 AM  Pager: [phone]  ADDENDUM: Pt to transition to PO xarelto and discharge today.  Completed xarelto medication education with patient.    Plan: Stop heparin and then give Xarelto 15mg  po x 1 prior to d/c.  Continue Xarelto 15mg  PO BID x 21 days, then start Xarelto 20mg  po qday with supper.    Haynes Hoehnolleen Regis Hinton, PharmD, BCPS 07/09/2014, 3:26 PM  Pager: [phone]

## 2014-07-09 NOTE — Discharge Instructions (Signed)
Information on my medicine - XARELTO (rivaroxaban)  This medication education was reviewed with me or my healthcare representative as part of my discharge preparation.  The pharmacist that spoke with me during my hospital stay was:  Wynonia HazardSumme, Zeke Aker E, [facility]  WHY WAS XARELTO PRESCRIBED FOR YOU? Xarelto was prescribed to treat blood clots that may have been found in the veins of your legs (deep vein thrombosis) or in your lungs (pulmonary embolism) and to reduce the risk of them occurring again.  What do you need to know about Xarelto? The starting dose is one 15 mg tablet taken TWICE daily with food for the FIRST 21 DAYS then on (enter date)  07/30/14  the dose is changed to one 20 mg tablet taken ONCE A DAY with your evening meal.  DO NOT stop taking Xarelto without talking to the health care provider who prescribed the medication.  Refill your prescription for 20 mg tablets before you run out.  After discharge, you should have regular check-up appointments with your healthcare provider that is prescribing your Xarelto.  In the future your dose may need to be changed if your kidney function changes by a significant amount.  What do you do if you miss a dose? If you are taking Xarelto TWICE DAILY and you miss a dose, take it as soon as you remember. You may take two 15 mg tablets (total 30 mg) at the same time then resume your regularly scheduled 15 mg twice daily the next day.  If you are taking Xarelto ONCE DAILY and you miss a dose, take it as soon as you remember on the same day then continue your regularly scheduled once daily regimen the next day. Do not take two doses of Xarelto at the same time.   Important Safety Information Xarelto is a blood thinner medicine that can cause bleeding. You should call your healthcare provider right away if you experience any of the following:   Bleeding from an injury or your nose that does not stop.   Unusual colored urine (red or dark brown) or  unusual colored stools (red or black).   Unusual bruising for unknown reasons.   A serious fall or if you hit your head (even if there is no bleeding).  Some medicines may interact with Xarelto and might increase your risk of bleeding while on Xarelto. To help avoid this, consult your healthcare provider or pharmacist prior to using any new prescription or non-prescription medications, including herbals, vitamins, non-steroidal anti-inflammatory drugs (NSAIDs) and supplements.  This website has more information on Xarelto: [email or web address].xarelto.[email or web address].

## 2014-07-09 NOTE — Progress Notes (Signed)
  Echocardiogram 2D Echocardiogram has been performed.  Ly Bacchi 07/09/2014, 3:00 PM

## 2014-07-09 NOTE — Discharge Summary (Signed)
Physician Discharge Summary  Jeremy Herman WGN:[id number] DOB: [birth year] DOA: 07/08/2014  PCP: PROVIDER NOT IN SYSTEM  Admit date: 07/08/2014 Discharge date: 07/09/2014  Time spent: >30 minutes  Recommendations for Outpatient Follow-up:  1. follow 2-D echo 2. Follow up with pulmonary service in 6 months; needs to repeat 2-D echo at that time  Discharge Diagnoses:  Principal Problem:   Pulmonary emboli Active Problems:   Bilateral pulmonary embolism   DVT, bilateral lower limbs   Smoker   Heart septum   Discharge Condition: stable and improved. Discharge home with xarelto therapy. Follow up with pulmonary service in approx 6 months to repeat 2-d echo and assess/treat pulmonary HTN further   Filed Weights   07/08/14 1155 07/08/14 1600 07/09/14 0507  Weight: 81.647 kg (180 lb) 74.98 kg (165 lb 4.8 oz) 75 kg (165 lb 5.5 oz)    History of present illness:  68 year old smoker admitted 8/11 with DVT and pulmonary embolism. He presented in 08/2013 with right lower extremity DVT and bilateral pulmonary emboli with moderate clot burden. He was started on rivaroxaban which he took for 21 days. However he did not followup with his PCP after this, and stopped taking the anticoagulant. He developed increasing left-sided pedal edema and presented to the emergency room again.  CT angiogram showed again bilateral acute and chronic pulmonary emboli with increase in clot burden compared to prior scan, especially in the left pulmonary artery. There was also enlargement of the lower pulmonary arteries suggestive of pulmonary arterial hypertension without any evidence of right heart strain. LE duplex this time confirm acute DVT in the left leg and subacute to chronic extensive DVT on the right.  He denies dyspnea or chest pain.   Hospital Course:  1-acute on chronic DVT/PE: secondary to medication non-compliance with anticoagulation therapy. -after discussing options and cost one more time, patient  opted for xarelto therapy  -will discharge on xarelto -patient is hemodynamically stable and with good O2 sat on RA -per pulmonary service no need of IVC filter currently and recommending repeat 2D echo in 6 months (to reevaluate pulmonary HTN)  2-tobacco abuse: discharge on nicotine patch -advised to quit smoking  3-Large ASD: follow up with cardiology as an outpatient. Follow up appointment already set up for him. -advise to be compliant with follow up visits  4-Pulmonary HTN: follow up with pulmonary service in approx 6 months. -needs good compliance with anticoagulation therapy  Procedures: 2-De cho (pending)  Consultations:  PCCM  Discharge Exam: Filed Vitals:   07/09/14 1406  BP: 106/67  Pulse: 95  Temp: 98 F (36.7 C)  Resp: 18    General: Afebrile, no CP, no SOB. Good O2 sat on RA Cardiovascular: S1 and S2, soft SEM, no rubs or gallops Respiratory: no wheezing, no crackles Extremities: swelling appreciated on his LE's (L > R) Neuro: no deficit.  Discharge Instructions You were cared for by a hospitalist during your hospital stay. If you have any questions about your discharge medications or the care you received while you were in the hospital after you are discharged, you can call the unit and asked to speak with the hospitalist on call if the hospitalist that took care of you is not available. Once you are discharged, your primary care physician will handle any further medical issues. Please note that NO REFILLS for any discharge medications will be authorized once you are discharged, as it is imperative that you return to your primary care physician (or establish a relationship with a  primary care physician if you do not have one) for your aftercare needs so that they can reassess your need for medications and monitor your lab values.  Discharge Instructions   Discharge instructions    Complete by:  As directed   Take medications as prescribed Arrange follow up to  establish care with PCP as instructed Keep yourself well hydrated Avoid the use of NSAID's for pain (aleve, ibuprofen, ASA, motrin, goody powder, etc....)            Medication List    STOP taking these medications       ibuprofen 200 MG tablet  Commonly known as:  ADVIL,MOTRIN      TAKE these medications       nicotine 21 mg/24hr patch  Commonly known as:  NICODERM CQ - dosed in mg/24 hours  Place 1 patch (21 mg total) onto the skin daily.     rivaroxaban 20 MG Tabs tablet  Commonly known as:  XARELTO  Take 1 tablet (20 mg total) by mouth daily with supper. To start taking after starter kit is completed (after 21 days of xarelto twice a day)  Start taking on:  07/31/2014     traMADol 50 MG tablet  Commonly known as:  ULTRAM  Take 1 tablet (50 mg total) by mouth every 6 (six) hours as needed for moderate pain.       Allergies  Allergen Reactions  . Fish Allergy Other (See Comments)    Diarrhea      The results of significant diagnostics from this hospitalization (including imaging, microbiology, ancillary and laboratory) are listed below for reference.    Significant Diagnostic Studies: Ct Angio Chest W/cm &/or Wo Cm  07/08/2014   CLINICAL DATA:  LEFT leg swelling worsening since last Wednesday. Groin pain. History of pulmonary embolism and deep venous thrombosis.  EXAM: CT ANGIOGRAPHY CHEST WITH CONTRAST  TECHNIQUE: Multidetector CT imaging of the chest was performed using the standard protocol during bolus administration of intravenous contrast. Multiplanar CT image reconstructions and MIPs were obtained to evaluate the vascular anatomy.  CONTRAST:  154m OMNIPAQUE IOHEXOL 350 MG/ML SOLN  COMPARISON:  08/28/2013.  FINDINGS: Bones: Exaggerated thoracic kyphosis with ankylosis extending from T3 through T12. Ossification of the discs is present. Ankylosis appears to be due to diffuse idiopathic skeletal hyperostosis. Sternomanubrial joint degenerative disease. Bilateral  sternoclavicular joint osteoarthritis is also noted. There are no aggressive osseous lesions.  Cardiovascular: Chronic LEFT lower lobe pulmonary embolism is present, which is adherent to the vessel wall. There is also chronic RIGHT lower lobe pulmonary embolism, again adherent to the vessel wall. Interval development of pulmonary embolism to the lingula, which may be acute or subacute. There is also an acute or subacute pulmonary embolism to the RIGHT lower lobe.  There is an increase in the clot burden in the LEFT pulmonary artery, most evident at the lingular and LEFT lower lobe bifurcation. 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 No evidence of RIGHT heart strain.  Aortic atherosclerosis without an acute aortic abnormality.Coronary artery atherosclerosis is present. If office based assessment of coronary risk factors has not been performed, it is now recommended.  Lungs: Areas of pulmonary parenchymal scarring are present with some areas of nodularity. There is at crescent-shaped area of nodularity in the inferior LEFT upper lobe adjacent to the major fissure (image 46 series 7). This likely represents interval pulmonary infarction associated with pulmonary emboli. There is no pneumonia.  Central airways: Tenacious secretions are present within the trachea.  Effusions: None.  Lymphadenopathy: No axillary adenopathy. No mediastinal or hilar adenopathy.  Esophagus: Normal.  Upper abdomen: Within normal limits.  Other: None.  Review of the MIP images confirms the above findings.  IMPRESSION: 1. Increase in clot burden associated with chronic pulmonary emboli with superimposed acute or subacute emboli. 2. Critical Value/emergent results were called by telephone at the time of interpretation on 07/08/2014 at 2:46 pm to Dr. Jola Schmidt , who verbally acknowledged these results. Patient also has deep venous thrombosis. 3. Enlargement of the  pulmonary arteries compatible with chronic thromboembolic pulmonary arterial hypertension. 4. Crescentic nodular density in the inferior LEFT upper lobe is evident on scout images. This is most compatible with pulmonary infarct in this patient with pulmonary emboli however radiographic followup to ensure progression to scarring is recommended. 5. Scattered areas of pulmonary parenchymal scarring associated with prior pulmonary infarcts.   Electronically Signed   By: Dereck Ligas M.D.   On: 07/08/2014 14:48   Labs: Basic Metabolic Panel:  Recent Labs Lab 07/08/14 1214  NA 138  K 4.8  CL 101  CO2 22  GLUCOSE 123*  BUN 14  CREATININE 0.58  CALCIUM 9.2   CBC:  Recent Labs Lab 07/08/14 1214 07/09/14 0239  WBC 9.5 8.2  HGB 13.4 11.7*  HCT 37.9* 33.8*  MCV 111.5* 112.3*  PLT 164 161   Cardiac Enzymes:  Recent Labs Lab 07/08/14 [place] <0.30    Signed:  Barton Dubois  [facility] 07/09/2014, 3:53 PM

## 2014-07-09 NOTE — Progress Notes (Signed)
Spoke with pt concerning PCP. Pt agreed on [facility], however no appointment available until Sept and the pt will need to call the office.  [facility] will take appointment on Mon the August, 17th. Encouraged pt to call both, telephone numbers given to pt.

## 2014-07-09 NOTE — Progress Notes (Signed)
ANTICOAGULATION CONSULT NOTE - F/U Consult  Pharmacy Consult for Heparin Indication: pulmonary embolus, DVT  Allergies  Allergen Reactions  . Fish Allergy Other (See Comments)    Diarrhea     Patient Measurements: Height: 6' (182.9 cm) Weight: 165 lb 5.5 oz (75 kg) IBW/kg (Calculated) : 77.6 Heparin Dosing Weight: 81.6 kg  Vital Signs: Temp: 98.8 F (37.1 C) (08/12 0507) Temp src: Oral (08/12 0507) BP: 106/67 mmHg (08/12 0507) Pulse Rate: 76 (08/12 0507)  Labs:  Recent Labs  07/08/14 1214 07/08/14 1547 07/09/14 0239  HGB 13.4  --  11.7*  HCT 37.9*  --  33.8*  PLT 164  --  161  LABPROT  --  13.1  --   INR  --  0.99  --   HEPARINUNFRC  --   --  0.38  CREATININE 0.58  --   --   TROPONINI <0.30  --   --     Estimated Creatinine Clearance: 95.1 ml/min (by C-G formula based on Cr of 0.58).   Medical History: Past Medical History  Diagnosis Date  . Medical history non-contributory   . DVT (deep venous thrombosis)   . Arthritis     Medications:  Scheduled:  . metoprolol tartrate  12.5 mg Oral BID  . nicotine  21 mg Transdermal Daily   Infusions:  . 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  1st HL = 0.38  No IV interuptions/bleeding per RN   Goal of Therapy:  Heparin level 0.3-0.7 units/ml Monitor platelets by anticoagulation protocol: Yes   Plan:   Increase heparin drip to 1500 units/hr  Recheck HL 11 am today  Follow daily heparin level & CBC while on IV heparin  Lorenza EvangelistGreen, Pratyush Ammon R, PharmD 07/09/2014,5:36 AM

## 2014-07-09 NOTE — Consult Note (Signed)
Name: Jeremy Herman MRN: [id number] DOB: Apr 02, 1946    ADMISSION DATE:  07/08/2014 CONSULTATION DATE:  07/09/2014  REFERRING MD :  [facility] PRIMARY SERVICE:  [facility]  CHIEF COMPLAINT:  Pedal edema   HISTORY OF PRESENT ILLNESS:  68 year old nonsmoker admitted 8/11 with DVT and pulmonary embolism. We are consulted to comment on anticoagulation and need for IVC filter. He presented in 08/2013 with right lower extremity DVT and bilateral pulmonary emboli with moderate clot burden. He was started on rivaroxaban which he took for 21 days. However he did not followup with his PCP after this, and stopped taking the anticoagulant. He developed increasing left-sided pedal edema and presented to the emergency room again. CT angiogram showed again bilateral acute and chronic pulmonary emboli with increase in clot burden compared to prior scan, especially in the left pulmonary artery. There was also enlargement of the lower pulmonary arteries suggestive of pulmonary arterial hypertension without any evidence of right heart strain. The rest duplex this time confirm acute DVT in the left leg and subacute to chronic extensive DVT on the right. He denies dyspnea or chest pain. He admits that the anticoagulant was expensive but that he dropped a ball and should have continued on this  PAST MEDICAL HISTORY :  Past Medical History  Diagnosis Date  . Medical history non-contributory   . DVT (deep venous thrombosis)   . Arthritis    Past Surgical History  Procedure Laterality Date  . Ankle surgery  1979    right ankle   Prior to Admission medications   Medication Sig Start Date End Date Taking? Authorizing Provider  ibuprofen (ADVIL,MOTRIN) 200 MG tablet Take 400 mg by mouth every 6 (six) hours as needed for moderate pain.   Yes Historical Provider, MD   Allergies  Allergen Reactions  . Fish Allergy Other (See Comments)    Diarrhea     FAMILY HISTORY:  Family History  Problem Relation Age of Onset    . Stomach cancer Father   . Aneurysm     SOCIAL HISTORY:  reports that he has been smoking Cigarettes.  He has a 43 pack-year smoking history. He has never used smokeless tobacco. He reports that he does not drink alcohol or use illicit drugs.  REVIEW OF SYSTEMS:   Constitutional: Negative for fever, chills, weight loss, malaise/fatigue and diaphoresis.  HENT: Negative for hearing loss, ear pain, nosebleeds, congestion, sore throat, neck pain, tinnitus and ear discharge.   Eyes: Negative for blurred vision, double vision, photophobia, pain, discharge and redness.  Respiratory: Negative for cough, hemoptysis, sputum production, shortness of breath, wheezing and stridor.   Cardiovascular: Negative for chest pain, palpitations, orthopnea, claudication, leg swelling and PND.  Gastrointestinal: Negative for heartburn, nausea, vomiting, abdominal pain, diarrhea, constipation, blood in stool and melena.  Genitourinary: Negative for dysuria, urgency, frequency, hematuria and flank pain.  Musculoskeletal: Negative for myalgias, back pain, joint pain and falls.  Skin: Negative for itching and rash.  Neurological: Negative for dizziness, tingling, tremors, sensory change, speech change, focal weakness, seizures, loss of consciousness, weakness and headaches.  Endo/Heme/Allergies: Negative for environmental allergies and polydipsia. Does  bruise/bleed easily.  SUBJECTIVE:   VITAL SIGNS: Temp:  [97.7 F (36.5 C)-98.8 F (37.1 C)] 98.8 F (37.1 C) (08/12 0507) Pulse Rate:  [76-141] 76 (08/12 0507) Resp:  [16-24] 16 (08/12 0507) BP: (102-139)/(51-86) 106/67 mmHg (08/12 0507) SpO2:  [96 %-98 %] 98 % (08/12 0507) Weight:  [165 lb 4.8 oz (74.98 kg)-180 lb (81.647 kg)] 165  lb 5.5 oz (75 kg) (08/12 0507)  PHYSICAL EXAMINATION: Gen. Pleasant, well-nourished, in no distress, normal affect ENT - no lesions, no post nasal drip Neck: No JVD, no thyromegaly, no carotid bruits Lungs: no use of accessory  muscles, no dullness to percussion, clear without rales or rhonchi  Cardiovascular: Rhythm regular, heart sounds  normal, no murmurs, no peripheral edema Abdomen: soft and non-tender, no hepatosplenomegaly, BS normal. 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 As such this has not failure of anticoagulation. He does not need an IVC filter. I discussed cost issues of novel anticoagulants with him, he prefers this over copays for Coumadin checks. I emphasized compliance with anticoagulation and the long-term-he will likely need anticoagulation lifelong. I discussed with him that he may have residual DVT and venous stasis issues in the future given the chronic nature He needs an echo in 6 months time to assess for residual pulmonary hypertension, he can either followup with us or with cardiology for this.   Cyril Mourningakesh Alva MD. Tonny BollmanFCCP. [facility] Pager [phone] If no response call [phone]    07/09/2014, 10:49 AM

## 2014-07-28 ENCOUNTER — Ambulatory Visit: Payer: Medicare Other | Attending: Internal Medicine | Admitting: Internal Medicine

## 2014-07-28 ENCOUNTER — Encounter: Payer: Self-pay | Admitting: Internal Medicine

## 2014-07-28 VITALS — BP 134/90 | HR 100 | Temp 98.2°F | Resp 16 | Wt 165.6 lb

## 2014-07-28 DIAGNOSIS — Z139 Encounter for screening, unspecified: Secondary | ICD-10-CM

## 2014-07-28 DIAGNOSIS — F17200 Nicotine dependence, unspecified, uncomplicated: Secondary | ICD-10-CM

## 2014-07-28 DIAGNOSIS — I2699 Other pulmonary embolism without acute cor pulmonale: Secondary | ICD-10-CM

## 2014-07-28 DIAGNOSIS — D649 Anemia, unspecified: Secondary | ICD-10-CM | POA: Diagnosis not present

## 2014-07-28 DIAGNOSIS — I82409 Acute embolism and thrombosis of unspecified deep veins of unspecified lower extremity: Secondary | ICD-10-CM

## 2014-07-28 DIAGNOSIS — I82403 Acute embolism and thrombosis of unspecified deep veins of lower extremity, bilateral: Secondary | ICD-10-CM

## 2014-07-28 LAB — COMPLETE METABOLIC PANEL WITH GFR
ALBUMIN: 3.4 g/dL — AB (ref 3.5–5.2)
ALT: 17 U/L (ref 0–53)
AST: 33 U/L (ref 0–37)
Alkaline Phosphatase: 76 U/L (ref 39–117)
BUN: 9 mg/dL (ref 6–23)
CALCIUM: 8.2 mg/dL — AB (ref 8.4–10.5)
CHLORIDE: 105 meq/L (ref 96–112)
CO2: 26 mEq/L (ref 19–32)
Creat: 0.52 mg/dL (ref 0.50–1.35)
GFR, Est African American: >89 mL/min
GFR, Est Non African American: >89 mL/min
GLUCOSE: 101 mg/dL — AB (ref 70–99)
POTASSIUM: 4.3 meq/L (ref 3.5–5.3)
SODIUM: 140 meq/L (ref 135–145)
TOTAL PROTEIN: 6.3 g/dL (ref 6.0–8.3)
Total Bilirubin: 0.8 mg/dL (ref 0.2–1.2)

## 2014-07-28 LAB — ANEMIA PANEL
ABS Retic: 32.6 10*3/uL (ref 19.0–186.0)
FOLATE: 1.2 ng/mL — AB
Ferritin: 828 ng/mL — ABNORMAL HIGH (ref 22–322)
Iron: 217 ug/dL — ABNORMAL HIGH (ref 42–165)
RBC.: 3.26 MIL/uL — ABNORMAL LOW (ref 4.22–5.81)
Retic Ct Pct: 1 % (ref 0.4–2.3)
VITAMIN B 12: 261 pg/mL (ref 211–911)

## 2014-07-28 NOTE — Progress Notes (Signed)
Patient Demographics  Jeremy Herman, is a 68 y.o. male  WLS:[id number]  OTL:[id number]  DOB - 02-Jul-1946  CC:  Chief Complaint  Patient presents with  . Hospitalization Follow-up       HPI: Jeremy Herman is a 68 y.o. male here today to establish medical care.Patient has history of right lower extremity DVT and bilateral pulmonary embolism which was diagnosed patient initially took Xarelto and then stopped and did not followed her, 2 weeks ago he was again hospitalized with symptoms of lower extremity edema, EMR reviewed her he had another imaging study done reported her bilateral acute and chronic pulmonary malaise and then increase in clot burden compared to previous scan as well as had increased pulmonary hypertension, lower extremity ultrasound confirmed an acute DVT in left leg and subacute to chronic extensive DVT on the right, patient was resumed back on Xarelto pulmonary service was on board and recommended followup 6 months to evaluate for pulmonary hypertension patient is in the process to schedule appointment with pulmonology. patient also history of smoking and was discharged nicotine patch patient has still not ready to quit. Patient had a blood work done which was reviewed noticed borderline anemia, patient denies any bleeding as per patient 6-7 years ago he had colonoscopy done and was told everything was normal. Patient has No headache, No chest pain, No abdominal pain - No Nausea, No new weakness tingling or numbness, No Cough - SOB.  Allergies  Allergen Reactions  . Fish Allergy Other (See Comments)    Diarrhea    Past Medical History  Diagnosis Date  . Medical history non-contributory   . DVT (deep venous thrombosis)   . Arthritis    Current Outpatient Prescriptions on File Prior to Visit  Medication Sig Dispense Refill  . nicotine (NICODERM CQ - DOSED IN MG/24 HOURS) 21 mg/24hr patch Place 1 patch (21 mg total) onto the skin daily.  28 patch  0  . [START ON  07/31/2014] rivaroxaban (XARELTO) 20 MG TABS tablet Take 1 tablet (20 mg total) by mouth daily with supper. To start taking after starter kit is completed (after 21 days of xarelto twice a day)  30 tablet  1  . traMADol (ULTRAM) 50 MG tablet Take 1 tablet (50 mg total) by mouth every 6 (six) hours as needed for moderate pain.  45 tablet  0   No current facility-administered medications on file prior to visit.   Family History  Problem Relation Age of Onset  . Stomach cancer Father   . Aneurysm    . Cancer Mother    History   Social History  . Marital Status: Married    Spouse Name: N/A    Number of Children: N/A  . Years of Education: N/A   Occupational History  . Not on file.   Social History Main Topics  . Smoking status: Current Every Day Smoker -- 1.00 packs/day for 43 years    Types: Cigarettes  . Smokeless tobacco: Never Used  . Alcohol Use: No     Comment: quit in 2001  . Drug Use: No  . Sexual Activity: Not on file   Other Topics Concern  . Not on file   Social History Narrative  . No narrative on file    Review of Systems: Constitutional: Negative for fever, chills, diaphoresis, activity change, appetite change and fatigue. HENT: Negative for ear pain, nosebleeds, congestion, facial swelling, rhinorrhea, neck pain, neck stiffness and ear discharge.  Eyes: Negative for  pain, discharge, redness, itching and visual disturbance. Respiratory: Negative for cough, choking, chest tightness, shortness of breath, wheezing and stridor.  Cardiovascular: Negative for chest pain, palpitations and leg swelling. Gastrointestinal: Negative for abdominal distention. Genitourinary: Negative for dysuria, urgency, frequency, hematuria, flank pain, decreased urine volume, difficulty urinating and dyspareunia.  Musculoskeletal: Negative for back pain, joint swelling, arthralgia and gait problem. Neurological: Negative for dizziness, tremors, seizures, syncope, facial asymmetry, speech  difficulty, weakness, light-headedness, numbness and headaches.  Hematological: Negative for adenopathy. Does not bruise/bleed easily. Psychiatric/Behavioral: Negative for hallucinations, behavioral problems, confusion, dysphoric mood, decreased concentration and agitation.    Objective:   Filed Vitals:   07/28/14 0955  BP: 134/90  Pulse: 100  Temp: 98.2 F (36.8 C)  Resp: 16    Physical Exam: Constitutional: Patient appears well-developed and well-nourished. No distress. HENT: Normocephalic, atraumatic, External right and left ear normal. Oropharynx is clear and moist.  Eyes: Conjunctivae and EOM are normal. PERRLA, no scleral icterus. Neck: Normal ROM. Neck supple. No JVD. No tracheal deviation. No thyromegaly. CVS: RRR, S1/S2 +, no murmurs, no gallops, no carotid bruit.  Pulmonary: Effort and breath sounds normal, no stridor, rhonchi, wheezes, rales.  Abdominal: Soft. BS +, no distension, tenderness, rebound or guarding.  Musculoskeletal: Normal range of motion. No edema and no tenderness. 2+ edema left leg Neuro: Alert. Normal reflexes, muscle tone coordination. No cranial nerve deficit. Skin: Skin is warm and dry. No rash noted. Not diaphoretic. No erythema. No pallor. Psychiatric: Normal mood and affect. Behavior, judgment, thought content normal.  Lab Results  Component Value Date   WBC 8.2 07/09/2014   HGB 11.7* 07/09/2014   HCT 33.8* 07/09/2014   MCV 112.3* 07/09/2014   PLT 161 07/09/2014   Lab Results  Component Value Date   CREATININE 0.58 07/08/2014   BUN 14 07/08/2014   NA 138 07/08/2014   K 4.8 07/08/2014   CL 101 07/08/2014   CO2 22 07/08/2014    No results found for this basename: HGBA1C   Lipid Panel     Component Value Date/Time   CHOL  Value: 128        ATP III CLASSIFICATION:  <200     mg/dL   Desirable  200-239  mg/dL   Borderline High  >=240    mg/dL   High        12/24/2010 0610   TRIG 132 12/24/2010 0610   HDL 25* 12/24/2010 0610   CHOLHDL 5.1  12/24/2010 0610   VLDL 26 12/24/2010 0610   LDLCALC  Value: 77        Total Cholesterol/HDL:CHD Risk Coronary Heart Disease Risk Table                     Men   Women  1/2 Average Risk   3.4   3.3  Average Risk       5.0   4.4  2 X Average Risk   9.6   7.1  3 X Average Risk  23.4   11.0        Use the calculated Patient Ratio above and the CHD Risk Table to determine the patient's CHD Risk.        ATP III CLASSIFICATION (LDL):  <100     mg/dL   Optimal  100-129  mg/dL   Near or Above                    Optimal  130-159  mg/dL  Borderline  160-189  mg/dL   High  >190     mg/dL   Very High 12/24/2010 0610       Assessment and plan:   1. Bilateral pulmonary embolism/2. DVT, bilateral lower limbs Currently patient is on Xarelto, also will follow up with pulmonology.  3. Smoker Patient is not ready to quit yet, I have advised patient to quit smoking.  4. Screening  - COMPLETE METABOLIC PANEL WITH GFR  5. Anemia, unspecified Will do anemia panel patient denies any bleeding. - Anemia panel   Health Maintenance -Colonoscopy: uptodate   -Vaccinations:   -Influenza patient declined  Return in about 3 months (around 10/27/2014).  Lorayne Marek, MD

## 2014-07-28 NOTE — Progress Notes (Signed)
Patient here for hospital follow up Was seen in the ed for blood clots to  His lower legs Currently on  Xarelto Patient refused influenza vaccine

## 2014-07-31 ENCOUNTER — Telehealth: Payer: Self-pay | Admitting: *Deleted

## 2014-07-31 NOTE — Telephone Encounter (Signed)
Message copied by Raynelle Chary on Thu Jul 31, 2014  3:58 PM ------      Message from: Doris Cheadle      Created: Tue Jul 29, 2014  2:04 PM       Blood work reviewed, noticed folic acid Deficiency, advised patient to take over-the-counter folic acid daily. ------

## 2014-07-31 NOTE — Telephone Encounter (Signed)
Left message on VM to return my call. 

## 2014-09-11 ENCOUNTER — Telehealth: Payer: Self-pay | Admitting: *Deleted

## 2014-09-11 NOTE — Telephone Encounter (Signed)
Pt stated is taking Xarelto and is having reaction to it Complaining of tingling in hand and toe, started two weeks ago very mile now is constant Pt advice to make apt with PCP Pt stated has apt already

## 2014-09-22 ENCOUNTER — Ambulatory Visit: Payer: Medicare Other | Attending: Internal Medicine | Admitting: Internal Medicine

## 2014-09-22 ENCOUNTER — Encounter: Payer: Self-pay | Admitting: Internal Medicine

## 2014-09-22 VITALS — BP 148/90 | HR 111 | Temp 97.9°F | Resp 16 | Wt 156.6 lb

## 2014-09-22 DIAGNOSIS — Z2821 Immunization not carried out because of patient refusal: Secondary | ICD-10-CM | POA: Diagnosis not present

## 2014-09-22 DIAGNOSIS — R202 Paresthesia of skin: Secondary | ICD-10-CM

## 2014-09-22 DIAGNOSIS — F17200 Nicotine dependence, unspecified, uncomplicated: Secondary | ICD-10-CM

## 2014-09-22 DIAGNOSIS — F17210 Nicotine dependence, cigarettes, uncomplicated: Secondary | ICD-10-CM | POA: Insufficient documentation

## 2014-09-22 DIAGNOSIS — I2699 Other pulmonary embolism without acute cor pulmonale: Secondary | ICD-10-CM | POA: Diagnosis not present

## 2014-09-22 DIAGNOSIS — R030 Elevated blood-pressure reading, without diagnosis of hypertension: Secondary | ICD-10-CM | POA: Insufficient documentation

## 2014-09-22 DIAGNOSIS — R209 Unspecified disturbances of skin sensation: Secondary | ICD-10-CM | POA: Diagnosis not present

## 2014-09-22 DIAGNOSIS — I82403 Acute embolism and thrombosis of unspecified deep veins of lower extremity, bilateral: Secondary | ICD-10-CM | POA: Insufficient documentation

## 2014-09-22 DIAGNOSIS — IMO0001 Reserved for inherently not codable concepts without codable children: Secondary | ICD-10-CM

## 2014-09-22 DIAGNOSIS — Z86718 Personal history of other venous thrombosis and embolism: Secondary | ICD-10-CM | POA: Diagnosis present

## 2014-09-22 DIAGNOSIS — Z720 Tobacco use: Secondary | ICD-10-CM

## 2014-09-22 LAB — CBC WITH DIFFERENTIAL/PLATELET
BASOS ABS: 0.1 10*3/uL (ref 0.0–0.1)
Basophils Relative: 1 % (ref 0–1)
EOS ABS: 0 10*3/uL (ref 0.0–0.7)
Eosinophils Relative: 0 % (ref 0–5)
HCT: 40 % (ref 39.0–52.0)
Hemoglobin: 13.7 g/dL (ref 13.0–17.0)
Lymphocytes Relative: 22 % (ref 12–46)
Lymphs Abs: 1.7 10*3/uL (ref 0.7–4.0)
MCH: 41.3 pg — AB (ref 26.0–34.0)
MCHC: 34.3 g/dL (ref 30.0–36.0)
MCV: 120.5 fL — AB (ref 78.0–100.0)
Monocytes Absolute: 0.7 10*3/uL (ref 0.1–1.0)
Monocytes Relative: 9 % (ref 3–12)
Neutro Abs: 5.2 10*3/uL (ref 1.7–7.7)
Neutrophils Relative %: 68 % (ref 43–77)
PLATELETS: 206 10*3/uL (ref 150–400)
RBC: 3.32 MIL/uL — ABNORMAL LOW (ref 4.22–5.81)
RDW: 16.9 % — AB (ref 11.5–15.5)
WBC: 7.7 10*3/uL (ref 4.0–10.5)

## 2014-09-22 LAB — POCT INR: INR: 1.1

## 2014-09-22 MED ORDER — WARFARIN SODIUM 5 MG PO TABS: 5.0000 mg | ORAL_TABLET | Freq: Every day | ORAL | Status: DC

## 2014-09-22 NOTE — Progress Notes (Signed)
MRN: [id number] Name: Jeremy Herman  Sex: male Age: 68 y.o. DOB: 1946-11-03  Allergies: Fish allergy  Chief Complaint  Patient presents with  . Follow-up    HPI: Patient is 68 y.o. male who has history of DVT/PE comes today for followup as per patient he develops symptoms of numbness tingling and then she stopped taking Xarelto for the last one week patient still has some numbness denies any weakness denies any headache dizziness chest and shortness of breath, as per patient he saw an add regarding Xarelto it can cause these symptoms that's why he stopped the medication, he denies any bleeding, patient is reluctant to take Xarelto and would like to switch to the Coumadin, patient still smokes cigarettes, I have advised patient to quit smoking, today's blood pressure is borderline elevated, he denies any family history of hypertension.  Past Medical History  Diagnosis Date  . Medical history non-contributory   . DVT (deep venous thrombosis)   . Arthritis     Past Surgical History  Procedure Laterality Date  . Ankle surgery  1979    right ankle      Medication List       This list is accurate as of: 09/22/14 12:01 PM.  Always use your most recent med list.               nicotine 21 mg/24hr patch  Commonly known as:  NICODERM CQ - dosed in mg/24 hours  Place 1 patch (21 mg total) onto the skin daily.     traMADol 50 MG tablet  Commonly known as:  ULTRAM  Take 1 tablet (50 mg total) by mouth every 6 (six) hours as needed for moderate pain.     warfarin 5 MG tablet  Commonly known as:  COUMADIN  Take 1 tablet (5 mg total) by mouth at bedtime.        Meds ordered this encounter  Medications  . warfarin (COUMADIN) 5 MG tablet    Sig: Take 1 tablet (5 mg total) by mouth at bedtime.    Dispense:  30 tablet    Refill:  2     There is no immunization history on file for this patient.  Family History  Problem Relation Age of Onset  . Stomach cancer Father     . Aneurysm    . Cancer Mother     History  Substance Use Topics  . Smoking status: Current Every Day Smoker -- 1.00 packs/day for 43 years    Types: Cigarettes  . Smokeless tobacco: Never Used  . Alcohol Use: No     Comment: quit in 2001    Review of Systems   As noted in HPI  Filed Vitals:   09/22/14 1200  BP: 148/90  Pulse:   Temp:   Resp:     Physical Exam  Physical Exam  Constitutional: No distress.  Eyes: EOM are normal. Pupils are equal, round, and reactive to light.  Cardiovascular: Normal rate and regular rhythm.   Pulmonary/Chest: Breath sounds normal. No respiratory distress. He has no wheezes. He has no rales.  Musculoskeletal: He exhibits no edema.  Hand grip equal and strong, DTR 2+    CBC    Component Value Date/Time   WBC 8.2 07/09/2014 0239   RBC 3.26* 07/28/2014 1023   RBC 3.01* 07/09/2014 0239   HGB 11.7* 07/09/2014 0239   HCT 33.8* 07/09/2014 0239   PLT 161 07/09/2014 0239   MCV 112.3* 07/09/2014 0239  LYMPHSABS 2.4 09/17/2013 1955   MONOABS 0.9 09/17/2013 1955   EOSABS 0.0 09/17/2013 1955   BASOSABS 0.0 09/17/2013 1955    CMP     Component Value Date/Time   NA 140 07/28/2014 1023   K 4.3 07/28/2014 1023   CL 105 07/28/2014 1023   CO2 26 07/28/2014 1023   GLUCOSE 101* 07/28/2014 1023   BUN 9 07/28/2014 1023   CREATININE 0.52 07/28/2014 1023   CREATININE 0.58 07/08/2014 1214   CALCIUM 8.2* 07/28/2014 1023   PROT 6.3 07/28/2014 1023   ALBUMIN 3.4* 07/28/2014 1023   AST 33 07/28/2014 1023   ALT 17 07/28/2014 1023   ALKPHOS 76 07/28/2014 1023   BILITOT 0.8 07/28/2014 1023   GFRNONAA >89 07/28/2014 1023   GFRNONAA >90 07/08/2014 1214   GFRAA >89 07/28/2014 1023   GFRAA >90 07/08/2014 1214    Lab Results  Component Value Date/Time   CHOL  Value: 128        ATP III CLASSIFICATION:  <200     mg/dL   Desirable  161-096200-239  mg/dL   Borderline High  >=045>=240    mg/dL   High        4/09/81191/27/2012  6:10 AM    No components found with this basename: hga1c     Lab Results  Component Value Date/Time   AST 33 07/28/2014 10:23 AM    Assessment and Plan  Bilateral pulmonary embolism/DVT, bilateral lower limbs - Plan: Patient has stopped taking Xarelto and would like to switch  to Coumadin warfarin (COUMADIN) 5 MG tablet, CBC with Differential, will get baseline INR started on 5 mg dose he will come back on Thursday for repeat INR check.   Smoker Advised patient to quit smoking, patient is not ready yet, he has already been prescribed nicotine patch.  Paresthesia of both hands Recent blood work showed borderline vitamin B 12 on the low side he will start taking supplement.  Elevated BP Advised patient for DASH diet.  Health Maintenance Patient declines flu shot  Return in about 3 months (around 12/23/2014) for DVT/elevated BP, INR check 10/29 nurse visit.  Doris CheadleADVANI, Virat Prather, MD

## 2014-09-22 NOTE — Progress Notes (Signed)
Patient here for follow up Patient complains of having some tingling in his toes Patient also states he is having some numbness to his fingers His symptoms started about a week or so ago Patient stopped taking his xarelto about a week ago as well Patient stated saw an ad on TV that if any of these symptoms started to Stop taking the medication right away

## 2014-09-22 NOTE — Patient Instructions (Addendum)
DASH Eating Plan DASH stands for "Dietary Approaches to Stop Hypertension." The DASH eating plan is a healthy eating plan that has been shown to reduce high blood pressure (hypertension). Additional health benefits may include reducing the risk of type 2 diabetes mellitus, heart disease, and stroke. The DASH eating plan may also help with weight loss. WHAT DO I NEED TO KNOW ABOUT THE DASH EATING PLAN? For the DASH eating plan, you will follow these general guidelines:  Choose foods with a percent daily value for sodium of less than 5% (as listed on the food label).  Use salt-free seasonings or herbs instead of table salt or sea salt.  Check with your health care provider or pharmacist before using salt substitutes.  Eat lower-sodium products, often labeled as "lower sodium" or "no salt added."  Eat fresh foods.  Eat more vegetables, fruits, and low-fat dairy products.  Choose whole grains. Look for the word "whole" as the first word in the ingredient list.  Choose fish and skinless chicken or turkey more often than red meat. Limit fish, poultry, and meat to 6 oz (170 g) each day.  Limit sweets, desserts, sugars, and sugary drinks.  Choose heart-healthy fats.  Limit cheese to 1 oz (28 g) per day.  Eat more home-cooked food and less restaurant, buffet, and fast food.  Limit fried foods.  Cook foods using methods other than frying.  Limit canned vegetables. If you do use them, rinse them well to decrease the sodium.  When eating at a restaurant, ask that your food be prepared with less salt, or no salt if possible. WHAT FOODS CAN I EAT? Seek help from a dietitian for individual calorie needs. Grains Whole grain or whole wheat bread. Brown rice. Whole grain or whole wheat pasta. Quinoa, bulgur, and whole grain cereals. Low-sodium cereals. Corn or whole wheat flour tortillas. Whole grain cornbread. Whole grain crackers. Low-sodium crackers. Vegetables Fresh or frozen vegetables  (raw, steamed, roasted, or grilled). Low-sodium or reduced-sodium tomato and vegetable juices. Low-sodium or reduced-sodium tomato sauce and paste. Low-sodium or reduced-sodium canned vegetables.  Fruits All fresh, canned (in natural juice), or frozen fruits. Meat and Other Protein Products Ground beef (85% or leaner), grass-fed beef, or beef trimmed of fat. Skinless chicken or turkey. Ground chicken or turkey. Pork trimmed of fat. All fish and seafood. Eggs. Dried beans, peas, or lentils. Unsalted nuts and seeds. Unsalted canned beans. Dairy Low-fat dairy products, such as skim or 1% milk, 2% or reduced-fat cheeses, low-fat ricotta or cottage cheese, or plain low-fat yogurt. Low-sodium or reduced-sodium cheeses. Fats and Oils Tub margarines without trans fats. Light or reduced-fat mayonnaise and salad dressings (reduced sodium). Avocado. Safflower, olive, or canola oils. Natural peanut or almond butter. Other Unsalted popcorn and pretzels. The items listed above may not be a complete list of recommended foods or beverages. Contact your dietitian for more options. WHAT FOODS ARE NOT RECOMMENDED? Grains White bread. White pasta. White rice. Refined cornbread. Bagels and croissants. Crackers that contain trans fat. Vegetables Creamed or fried vegetables. Vegetables in a cheese sauce. Regular canned vegetables. Regular canned tomato sauce and paste. Regular tomato and vegetable juices. Fruits Dried fruits. Canned fruit in light or heavy syrup. Fruit juice. Meat and Other Protein Products Fatty cuts of meat. Ribs, chicken wings, bacon, sausage, bologna, salami, chitterlings, fatback, hot dogs, bratwurst, and packaged luncheon meats. Salted nuts and seeds. Canned beans with salt. Dairy Whole or 2% milk, cream, half-and-half, and cream cheese. Whole-fat or sweetened yogurt. Full-fat   cheeses or blue cheese. Nondairy creamers and whipped toppings. Processed cheese, cheese spreads, or cheese  curds. Condiments Onion and garlic salt, seasoned salt, table salt, and sea salt. Canned and packaged gravies. Worcestershire sauce. Tartar sauce. Barbecue sauce. Teriyaki sauce. Soy sauce, including reduced sodium. Steak sauce. Fish sauce. Oyster sauce. Cocktail sauce. Horseradish. Ketchup and mustard. Meat flavorings and tenderizers. Bouillon cubes. Hot sauce. Tabasco sauce. Marinades. Taco seasonings. Relishes. Fats and Oils Butter, stick margarine, lard, shortening, ghee, and bacon fat. Coconut, palm kernel, or palm oils. Regular salad dressings. Other Pickles and olives. Salted popcorn and pretzels. The items listed above may not be a complete list of foods and beverages to avoid. Contact your dietitian for more information. WHERE CAN I FIND MORE INFORMATION? National [employer], [employer], [employer]: www.nhlbi.nih.gov/health/health-topics/topics/dash/ Document Released: 11/03/2011 Document Revised: 03/31/2014 Document Reviewed: 09/18/2013 [employer] Patient Information 2015 [employer]. This information is not intended to replace advice given to you by your health care provider. Make sure you discuss any questions you have with your health care provider. Warfarin tablets What is this medicine? WARFARIN (WAR far in) is an anticoagulant. It is used to treat or prevent clots in the veins, arteries, lungs, or heart. This medicine may be used for other purposes; ask your health care provider or pharmacist if you have questions. COMMON BRAND NAME(S): Coumadin, Jantoven What should I tell my health care provider before I take this medicine? They need to know if you have any of these conditions: -alcoholism -anemia -bleeding disorders -cancer -diabetes -heart disease -high blood pressure -history of bleeding in the gastrointestinal tract -history of stroke or other brain injury or disease -kidney or liver disease -protein C deficiency -protein S deficiency -psychosis or  dementia -recent injury, recent or planned surgery or procedure -an unusual or allergic reaction to warfarin, other medicines, foods, dyes, or preservatives -pregnant or trying to get pregnant -breast-feeding How should I use this medicine? Take this medicine by mouth with a glass of water. Follow the directions on the prescription label. You can take this medicine with or without food. Take your medicine at the same time each day. Do not take it more often than directed. Do not stop taking except on your doctor's advice. Stopping this medicine may increase your risk of a blood clot. Be sure to refill your prescription before you run out of medicine. If your doctor or healthcare professional calls to change your dose, write down the dose and any other instructions. Always read the dose and instructions back to him or her to make sure you understand them. Tell your doctor or healthcare professional what strength of tablets you have on hand. Ask how many tablets you should take to equal your new dose. Write the date on the new instructions and keep them near your medicine. If you are told to stop taking your medicine until your next blood test, call your doctor or healthcare professional if you do not hear anything within 24 hours of the test to find out your new dose or when to restart your prior dose. A special MedGuide will be given to you by the pharmacist with each prescription and refill. Be sure to read this information carefully each time. Talk to your pediatrician regarding the use of this medicine in children. Special care may be needed. Overdosage: If you think you have taken too much of this medicine contact a poison control center or emergency room at once. NOTE: This medicine is only for you. Do not share   this medicine with others. What if I miss a dose? It is important not to miss a dose. If you miss a dose, call your healthcare provider. Take the dose as soon as possible on the same day.  If it is almost time for your next dose, take only that dose. Do not take double or extra doses to make up for a missed dose. What may interact with this medicine? Do not take this medicine with any of the following medications: -agents that prevent or dissolve blood clots -aspirin or other salicylates -danshen -dextrothyroxine -mifepristone -St. John's Wort -red yeast rice This medicine may also interact with the following medications: -acetaminophen -agents that lower cholesterol -alcohol -allopurinol -amiodarone -antibiotics or medicines for treating bacterial, fungal or viral infections -azathioprine -barbiturate medicines for inducing sleep or treating seizures -certain medicines for diabetes -certain medicines for heart rhythm problems -certain medicines for high blood pressure -chloral hydrate -cisapride -disulfiram -male hormones, including contraceptive or birth control pills -general anesthetics -herbal or dietary products like garlic, ginkgo, ginseng, green tea, or kava kava -influenza virus vaccine -male hormones -medicines for mental depression or psychosis -medicines for some types of cancer -medicines for stomach problems -methylphenidate -NSAIDs, medicines for pain and inflammation, like ibuprofen or naproxen -propoxyphene -quinidine, quinine -raloxifene -seizure or epilepsy medicine like carbamazepine, phenytoin, and valproic acid -steroids like cortisone and prednisone -tamoxifen -thyroid medicine -tramadol -vitamin c, vitamin e, and vitamin K -zafirlukast -zileuton This list may not describe all possible interactions. Give your health care provider a list of all the medicines, herbs, non-prescription drugs, or dietary supplements you use. Also tell them if you smoke, drink alcohol, or use illegal drugs. Some items may interact with your medicine. What should I watch for while using this medicine? Visit your doctor or health care professional for  regular checks on your progress. You will need to have a blood test called a PT/INR regularly. The PT/INR blood test is done to make sure you are getting the right dose of this medicine. It is important to not miss your appointment for the blood tests. When you first start taking this medicine, these tests are done often. Once the correct dose is determined and you take your medicine properly, these tests can be done less often. Notify your doctor or health care professional and seek emergency treatment if you develop breathing problems; changes in vision; chest pain; severe, sudden headache; pain, swelling, warmth in the leg; trouble speaking; sudden numbness or weakness of the face, arm or leg. These can be signs that your condition has gotten worse. While you are taking this medicine, carry an identification card with your name, the name and dose of medicine(s) being used, and the name and phone number of your doctor or health care professional or person to contact in an emergency. Do not start taking or stop taking any medicines or over-the-counter medicines except on the advice of your doctor or health care professional. You should discuss your diet with your doctor or health care professional. Do not make major changes in your diet. Vitamin K can affect how well this medicine works. Many foods contain vitamin K. It is important to eat a consistent amount of foods with vitamin K. Other foods with vitamin K that you should eat in consistent amounts are asparagus, basil, beef or pork liver, black eyed peas, broccoli, brussel sprouts, cabbage, chickpeas, cucumber with peel, green onions, green tea, okra, parsley, peas, thyme, and green leafy vegetables like beet greens, collard greens, endive, kale,   mustard greens, spinach, turnip greens, watercress, or certain lettuces like green leaf or romaine. This medicine can cause birth defects or bleeding in an unborn child. Women of childbearing age should use  effective birth control while taking this medicine. If a woman becomes pregnant while taking this medicine, she should discuss the potential risks and her options with her health care professional. Avoid sports and activities that might cause injury while you are using this medicine. Severe falls or injuries can cause unseen bleeding. Be careful when using sharp tools or knives. Consider using an electric razor. Take special care brushing or flossing your teeth. Report any injuries, bruising, or red spots on the skin to your doctor or health care professional. If you have an illness that causes vomiting, diarrhea, or fever for more than a few days, contact your doctor. Also check with your doctor if you are unable to eat for several days. These problems can change the effect of this medicine. Even after you stop taking this medicine, it takes several days before your body recovers its normal ability to clot blood. Ask your doctor or health care professional how long you need to be careful. If you are going to have surgery or dental work, tell your doctor or health care professional that you have been taking this medicine. What side effects may I notice from receiving this medicine? Side effects that you should report to your doctor or health care professional as soon as possible: -back pain -chills -dizziness -fever -heavy menstrual bleeding or vaginal bleeding -painful, blue, or purple toes -painful, prolonged erection -signs and symptoms of bleeding such as bloody or black, tarry stools; red or dark-brown urine; spitting up blood or brown material that looks like coffee grounds; red spots on the skin; unusual bruising or bleeding from the eye, gums, or nose-skin rash, itching or skin damage -stomach pain -unusually weak or tired -yellowing of skin or eyes Side effects that usually do not require medical attention (report to your doctor or health care professional if they continue or are  bothersome): -diarrhea -hair loss This list may not describe all possible side effects. Call your doctor for medical advice about side effects. You may report side effects to FDA at 1-800-FDA-1088. Where should I keep my medicine? Keep out of the reach of children. Store at room temperature between 15 and 30 degrees C (59 and 86 degrees F). Protect from light. Throw away any unused medicine after the expiration date. Do not flush down the toilet. NOTE: This sheet is a summary. It may not cover all possible information. If you have questions about this medicine, talk to your doctor, pharmacist, or health care provider.  2015, [employer]. (2013-06-05 12:17:56)  

## 2014-09-25 ENCOUNTER — Ambulatory Visit: Payer: Medicare Other | Attending: Internal Medicine | Admitting: Pharmacist

## 2014-09-25 DIAGNOSIS — I2699 Other pulmonary embolism without acute cor pulmonale: Secondary | ICD-10-CM

## 2014-09-25 DIAGNOSIS — F17200 Nicotine dependence, unspecified, uncomplicated: Secondary | ICD-10-CM

## 2014-09-25 LAB — POCT INR: INR: 1.8

## 2014-09-25 NOTE — Progress Notes (Signed)
Pt comes in as new [facility]  Diagnosis Bilateral PE,DVT lower extrem Pt last week INR- 1.1 INR today- 1.8 denies bleeding,missed dosing  Orders given from Dr.Advani

## 2014-09-25 NOTE — Patient Instructions (Signed)
Take 1 tablet daily and return next Thursday for INR repeat

## 2014-10-02 ENCOUNTER — Ambulatory Visit: Payer: Medicare Other | Attending: Internal Medicine | Admitting: Pharmacist

## 2014-10-02 DIAGNOSIS — I8290 Acute embolism and thrombosis of unspecified vein: Secondary | ICD-10-CM

## 2014-10-02 LAB — POCT INR: INR: 6

## 2014-10-06 ENCOUNTER — Ambulatory Visit: Payer: Medicare Other | Attending: Internal Medicine | Admitting: Pharmacist

## 2014-10-06 DIAGNOSIS — F17200 Nicotine dependence, unspecified, uncomplicated: Secondary | ICD-10-CM

## 2014-10-06 DIAGNOSIS — I82409 Acute embolism and thrombosis of unspecified deep veins of unspecified lower extremity: Secondary | ICD-10-CM

## 2014-10-06 DIAGNOSIS — I2699 Other pulmonary embolism without acute cor pulmonale: Secondary | ICD-10-CM | POA: Diagnosis not present

## 2014-10-06 LAB — POCT INR: INR: 2.6

## 2014-10-13 ENCOUNTER — Ambulatory Visit: Payer: Medicare Other | Attending: Internal Medicine | Admitting: Pharmacist

## 2014-10-13 DIAGNOSIS — I2699 Other pulmonary embolism without acute cor pulmonale: Secondary | ICD-10-CM

## 2014-10-13 DIAGNOSIS — I82409 Acute embolism and thrombosis of unspecified deep veins of unspecified lower extremity: Secondary | ICD-10-CM

## 2014-10-13 DIAGNOSIS — F17200 Nicotine dependence, unspecified, uncomplicated: Secondary | ICD-10-CM

## 2014-10-13 LAB — POCT INR: INR: 1.8

## 2014-10-20 ENCOUNTER — Ambulatory Visit: Payer: Medicare Other | Attending: Internal Medicine | Admitting: Pharmacist

## 2014-10-20 DIAGNOSIS — I8290 Acute embolism and thrombosis of unspecified vein: Secondary | ICD-10-CM | POA: Diagnosis not present

## 2014-10-20 LAB — POCT INR: INR: 1.7

## 2014-10-27 ENCOUNTER — Ambulatory Visit: Payer: Medicare Other | Attending: Internal Medicine

## 2014-10-27 DIAGNOSIS — Z7901 Long term (current) use of anticoagulants: Secondary | ICD-10-CM | POA: Insufficient documentation

## 2014-10-27 DIAGNOSIS — I8290 Acute embolism and thrombosis of unspecified vein: Secondary | ICD-10-CM | POA: Insufficient documentation

## 2014-10-27 LAB — POCT INR: INR: 1.8

## 2014-11-03 ENCOUNTER — Ambulatory Visit: Payer: Medicare Other | Attending: Internal Medicine | Admitting: Pharmacist

## 2014-11-03 DIAGNOSIS — I82409 Acute embolism and thrombosis of unspecified deep veins of unspecified lower extremity: Secondary | ICD-10-CM

## 2014-11-03 DIAGNOSIS — F17200 Nicotine dependence, unspecified, uncomplicated: Secondary | ICD-10-CM

## 2014-11-03 DIAGNOSIS — I2699 Other pulmonary embolism without acute cor pulmonale: Secondary | ICD-10-CM

## 2014-11-03 LAB — POCT INR: INR: 2.5

## 2014-11-10 ENCOUNTER — Ambulatory Visit: Payer: Medicare Other | Attending: Internal Medicine | Admitting: Pharmacist

## 2014-11-10 DIAGNOSIS — F17200 Nicotine dependence, unspecified, uncomplicated: Secondary | ICD-10-CM

## 2014-11-10 DIAGNOSIS — I2699 Other pulmonary embolism without acute cor pulmonale: Secondary | ICD-10-CM

## 2014-11-10 DIAGNOSIS — I82409 Acute embolism and thrombosis of unspecified deep veins of unspecified lower extremity: Secondary | ICD-10-CM

## 2014-12-09 ENCOUNTER — Ambulatory Visit: Payer: Medicare Other | Attending: Internal Medicine | Admitting: Pharmacist

## 2014-12-09 DIAGNOSIS — I82409 Acute embolism and thrombosis of unspecified deep veins of unspecified lower extremity: Secondary | ICD-10-CM

## 2014-12-09 DIAGNOSIS — F17200 Nicotine dependence, unspecified, uncomplicated: Secondary | ICD-10-CM

## 2014-12-09 DIAGNOSIS — I2699 Other pulmonary embolism without acute cor pulmonale: Secondary | ICD-10-CM | POA: Diagnosis not present

## 2014-12-09 DIAGNOSIS — I82403 Acute embolism and thrombosis of unspecified deep veins of lower extremity, bilateral: Secondary | ICD-10-CM

## 2014-12-09 LAB — POCT INR: INR: 1.7

## 2014-12-09 MED ORDER — WARFARIN SODIUM 5 MG PO TABS: 5.0000 mg | ORAL_TABLET | Freq: Every day | ORAL | Status: DC

## 2014-12-16 ENCOUNTER — Ambulatory Visit: Payer: Medicare Other | Attending: Internal Medicine | Admitting: Pharmacist

## 2014-12-16 DIAGNOSIS — I82409 Acute embolism and thrombosis of unspecified deep veins of unspecified lower extremity: Secondary | ICD-10-CM

## 2014-12-16 DIAGNOSIS — I2699 Other pulmonary embolism without acute cor pulmonale: Secondary | ICD-10-CM

## 2014-12-16 DIAGNOSIS — F17200 Nicotine dependence, unspecified, uncomplicated: Secondary | ICD-10-CM

## 2014-12-16 LAB — POCT INR: INR: 1.7

## 2014-12-23 ENCOUNTER — Ambulatory Visit: Payer: Medicare Other | Attending: Internal Medicine | Admitting: Pharmacist

## 2014-12-23 DIAGNOSIS — F17200 Nicotine dependence, unspecified, uncomplicated: Secondary | ICD-10-CM

## 2014-12-23 DIAGNOSIS — I2699 Other pulmonary embolism without acute cor pulmonale: Secondary | ICD-10-CM | POA: Diagnosis not present

## 2014-12-23 DIAGNOSIS — I82409 Acute embolism and thrombosis of unspecified deep veins of unspecified lower extremity: Secondary | ICD-10-CM

## 2014-12-23 LAB — POCT INR: INR: 7.5

## 2014-12-26 ENCOUNTER — Ambulatory Visit: Payer: Medicare Other | Attending: Internal Medicine | Admitting: Pharmacist

## 2014-12-26 DIAGNOSIS — F17200 Nicotine dependence, unspecified, uncomplicated: Secondary | ICD-10-CM

## 2014-12-26 DIAGNOSIS — I2699 Other pulmonary embolism without acute cor pulmonale: Secondary | ICD-10-CM

## 2014-12-26 DIAGNOSIS — I82409 Acute embolism and thrombosis of unspecified deep veins of unspecified lower extremity: Secondary | ICD-10-CM

## 2014-12-26 DIAGNOSIS — I82403 Acute embolism and thrombosis of unspecified deep veins of lower extremity, bilateral: Secondary | ICD-10-CM

## 2014-12-26 LAB — POCT INR: INR: 3.5

## 2014-12-26 MED ORDER — WARFARIN SODIUM 5 MG PO TABS: 5.0000 mg | ORAL_TABLET | Freq: Every day | ORAL | Status: DC

## 2014-12-30 ENCOUNTER — Ambulatory Visit: Payer: Medicare Other | Attending: Internal Medicine | Admitting: Pharmacist

## 2014-12-30 DIAGNOSIS — I2699 Other pulmonary embolism without acute cor pulmonale: Secondary | ICD-10-CM | POA: Diagnosis not present

## 2014-12-30 DIAGNOSIS — F17200 Nicotine dependence, unspecified, uncomplicated: Secondary | ICD-10-CM

## 2014-12-30 DIAGNOSIS — I82409 Acute embolism and thrombosis of unspecified deep veins of unspecified lower extremity: Secondary | ICD-10-CM

## 2014-12-30 LAB — POCT INR: INR: 3

## 2015-01-06 ENCOUNTER — Ambulatory Visit: Payer: Medicare Other | Attending: Internal Medicine | Admitting: Pharmacist

## 2015-01-06 DIAGNOSIS — I82409 Acute embolism and thrombosis of unspecified deep veins of unspecified lower extremity: Secondary | ICD-10-CM | POA: Diagnosis not present

## 2015-01-06 DIAGNOSIS — F17200 Nicotine dependence, unspecified, uncomplicated: Secondary | ICD-10-CM

## 2015-01-06 DIAGNOSIS — I2699 Other pulmonary embolism without acute cor pulmonale: Secondary | ICD-10-CM

## 2015-01-06 LAB — POCT INR: INR: 5.3

## 2015-01-13 ENCOUNTER — Ambulatory Visit: Payer: Medicare Other | Admitting: Internal Medicine

## 2015-01-20 ENCOUNTER — Ambulatory Visit: Payer: Medicare Other | Attending: Internal Medicine | Admitting: Pharmacist

## 2015-01-20 DIAGNOSIS — F17200 Nicotine dependence, unspecified, uncomplicated: Secondary | ICD-10-CM

## 2015-01-20 DIAGNOSIS — I82409 Acute embolism and thrombosis of unspecified deep veins of unspecified lower extremity: Secondary | ICD-10-CM

## 2015-01-20 DIAGNOSIS — I2699 Other pulmonary embolism without acute cor pulmonale: Secondary | ICD-10-CM | POA: Diagnosis not present

## 2015-01-20 LAB — POCT INR: INR: 3.9

## 2015-01-23 ENCOUNTER — Encounter: Payer: Self-pay | Admitting: Internal Medicine

## 2015-01-23 ENCOUNTER — Ambulatory Visit: Payer: Medicare Other | Attending: Internal Medicine | Admitting: Internal Medicine

## 2015-01-23 VITALS — BP 158/94 | HR 106 | Temp 98.8°F | Resp 16 | Wt 160.0 lb

## 2015-01-23 DIAGNOSIS — I10 Essential (primary) hypertension: Secondary | ICD-10-CM | POA: Diagnosis not present

## 2015-01-23 DIAGNOSIS — R002 Palpitations: Secondary | ICD-10-CM

## 2015-01-23 DIAGNOSIS — F17210 Nicotine dependence, cigarettes, uncomplicated: Secondary | ICD-10-CM | POA: Diagnosis not present

## 2015-01-23 DIAGNOSIS — Z79899 Other long term (current) drug therapy: Secondary | ICD-10-CM | POA: Diagnosis not present

## 2015-01-23 DIAGNOSIS — I2699 Other pulmonary embolism without acute cor pulmonale: Secondary | ICD-10-CM

## 2015-01-23 LAB — CBC WITH DIFFERENTIAL/PLATELET
BASOS PCT: 1 % (ref 0–1)
Basophils Absolute: 0.1 10*3/uL (ref 0.0–0.1)
Eosinophils Absolute: 0 10*3/uL (ref 0.0–0.7)
Eosinophils Relative: 0 % (ref 0–5)
HEMATOCRIT: 43.2 % (ref 39.0–52.0)
HEMOGLOBIN: 14.7 g/dL (ref 13.0–17.0)
LYMPHS ABS: 1.3 10*3/uL (ref 0.7–4.0)
Lymphocytes Relative: 16 % (ref 12–46)
MCH: 36.8 pg — ABNORMAL HIGH (ref 26.0–34.0)
MCHC: 34 g/dL (ref 30.0–36.0)
MCV: 108 fL — ABNORMAL HIGH (ref 78.0–100.0)
MPV: 9.8 fL (ref 8.6–12.4)
Monocytes Absolute: 0.9 10*3/uL (ref 0.1–1.0)
Monocytes Relative: 11 % (ref 3–12)
NEUTROS PCT: 72 % (ref 43–77)
Neutro Abs: 5.8 10*3/uL (ref 1.7–7.7)
Platelets: 218 10*3/uL (ref 150–400)
RBC: 4 MIL/uL — ABNORMAL LOW (ref 4.22–5.81)
RDW: 15.4 % (ref 11.5–15.5)
WBC: 8.1 10*3/uL (ref 4.0–10.5)

## 2015-01-23 LAB — COMPLETE METABOLIC PANEL WITH GFR
ALBUMIN: 3.9 g/dL (ref 3.5–5.2)
ALT: 71 U/L — ABNORMAL HIGH (ref 0–53)
AST: 110 U/L — ABNORMAL HIGH (ref 0–37)
Alkaline Phosphatase: 91 U/L (ref 39–117)
BILIRUBIN TOTAL: 0.8 mg/dL (ref 0.2–1.2)
BUN: 11 mg/dL (ref 6–23)
CALCIUM: 8.6 mg/dL (ref 8.4–10.5)
CHLORIDE: 109 meq/L (ref 96–112)
CO2: 21 meq/L (ref 19–32)
Creat: 0.69 mg/dL (ref 0.50–1.35)
GFR, Est African American: >89 mL/min
GFR, Est Non African American: >89 mL/min
GLUCOSE: 87 mg/dL (ref 70–99)
Potassium: 4.6 mEq/L (ref 3.5–5.3)
SODIUM: 142 meq/L (ref 135–145)
Total Protein: 7.2 g/dL (ref 6.0–8.3)

## 2015-01-23 LAB — TSH: TSH: 2.178 u[IU]/mL (ref 0.350–4.500)

## 2015-01-23 MED ORDER — ATENOLOL 25 MG PO TABS: 12.5000 mg | ORAL_TABLET | Freq: Every day | ORAL | Status: DC

## 2015-01-23 NOTE — Progress Notes (Signed)
Patient here for follow up Patient states he is having side effects from his coumadin Complains of his heart racing. 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

## 2015-01-23 NOTE — Patient Instructions (Signed)
Smoking Cessation Quitting smoking is important to your health and has many advantages. However, it is not always easy to quit since nicotine is a very addictive drug. Oftentimes, people try 3 times or more before being able to quit. This document explains the best ways for you to prepare to quit smoking. Quitting takes hard work and a lot of effort, but you can do it. ADVANTAGES OF QUITTING SMOKING  You will live longer, feel better, and live better.  Your body will feel the impact of quitting smoking almost immediately.  Within 20 minutes, blood pressure decreases. Your pulse returns to its normal level.  After 8 hours, carbon monoxide levels in the blood return to normal. Your oxygen level increases.  After 24 hours, the chance of having a heart attack starts to decrease. Your breath, hair, and body stop smelling like smoke.  After 48 hours, damaged nerve endings begin to recover. Your sense of taste and smell improve.  After 72 hours, the body is virtually free of nicotine. Your bronchial tubes relax and breathing becomes easier.  After 2 to 12 weeks, lungs can hold more air. Exercise becomes easier and circulation improves.  The risk of having a heart attack, stroke, cancer, or lung disease is greatly reduced.  After 1 year, the risk of coronary heart disease is cut in half.  After 5 years, the risk of stroke falls to the same as a nonsmoker.  After 10 years, the risk of lung cancer is cut in half and the risk of other cancers decreases significantly.  After 15 years, the risk of coronary heart disease drops, usually to the level of a nonsmoker.  If you are pregnant, quitting smoking will improve your chances of having a healthy baby.  The people you live with, especially any children, will be healthier.  You will have extra money to spend on things other than cigarettes. QUESTIONS TO THINK ABOUT BEFORE ATTEMPTING TO QUIT You may want to talk about your answers with your  health care provider.  Why do you want to quit?  If you tried to quit in the past, what helped and what did not?  What will be the most difficult situations for you after you quit? How will you plan to handle them?  Who can help you through the tough times? Your family? Friends? A health care provider?  What pleasures do you get from smoking? What ways can you still get pleasure if you quit? Here are some questions to ask your health care provider:  How can you help me to be successful at quitting?  What medicine do you think would be best for me and how should I take it?  What should I do if I need more help?  What is smoking withdrawal like? How can I get information on withdrawal? GET READY  Set a quit date.  Change your environment by getting rid of all cigarettes, ashtrays, matches, and lighters in your home, car, or work. Do not let people smoke in your home.  Review your past attempts to quit. Think about what worked and what did not. GET SUPPORT AND ENCOURAGEMENT You have a better chance of being successful if you have help. You can get support in many ways.  Tell your family, friends, and coworkers that you are going to quit and need their support. Ask them not to smoke around you.  Get individual, group, or telephone counseling and support. Programs are available at local hospitals and health centers. Call   your local health department for information about programs in your area.  Spiritual beliefs and practices may help some smokers quit.  Download a "quit meter" on your computer to keep track of quit statistics, such as how long you have gone without smoking, cigarettes not smoked, and money saved.  Get a self-help book about quitting smoking and staying off tobacco. LEARN NEW SKILLS AND BEHAVIORS  Distract yourself from urges to smoke. Talk to someone, go for a walk, or occupy your time with a task.  Change your normal routine. Take a different route to work.  Drink tea instead of coffee. Eat breakfast in a different place.  Reduce your stress. Take a hot bath, exercise, or read a book.  Plan something enjoyable to do every day. Reward yourself for not smoking.  Explore interactive web-based programs that specialize in helping you quit. GET MEDICINE AND USE IT CORRECTLY Medicines can help you stop smoking and decrease the urge to smoke. Combining medicine with the above behavioral methods and support can greatly increase your chances of successfully quitting smoking.  Nicotine replacement therapy helps deliver nicotine to your body without the negative effects and risks of smoking. Nicotine replacement therapy includes nicotine gum, lozenges, inhalers, nasal sprays, and skin patches. Some may be available over-the-counter and others require a prescription.  Antidepressant medicine helps people abstain from smoking, but how this works is unknown. This medicine is available by prescription.  Nicotinic receptor partial agonist medicine simulates the effect of nicotine in your brain. This medicine is available by prescription. Ask your health care provider for advice about which medicines to use and how to use them based on your health history. Your health care provider will tell you what side effects to look out for if you choose to be on a medicine or therapy. Carefully read the information on the package. Do not use any other product containing nicotine while using a nicotine replacement product.  RELAPSE OR DIFFICULT SITUATIONS Most relapses occur within the first 3 months after quitting. Do not be discouraged if you start smoking again. Remember, most people try several times before finally quitting. You may have symptoms of withdrawal because your body is used to nicotine. You may crave cigarettes, be irritable, feel very hungry, cough often, get headaches, or have difficulty concentrating. The withdrawal symptoms are only temporary. They are strongest  when you first quit, but they will go away within 10-14 days. To reduce the chances of relapse, try to:  Avoid drinking alcohol. Drinking lowers your chances of successfully quitting.  Reduce the amount of caffeine you consume. Once you quit smoking, the amount of caffeine in your body increases and can give you symptoms, such as a rapid heartbeat, sweating, and anxiety.  Avoid smokers because they can make you want to smoke.  Do not let weight gain distract you. Many smokers will gain weight when they quit, usually less than 10 pounds. Eat a healthy diet and stay active. You can always lose the weight gained after you quit.  Find ways to improve your mood other than smoking. FOR MORE INFORMATION  www.smokefree.gov  Document Released: 11/08/2001 Document Revised: 03/31/2014 Document Reviewed: 02/23/2012 Exit[employer] Patient Information 2015 [employer]. This information is not intended to replace advice given to you by your health care provider. Make sure you discuss any questions you have with your health care provider. DASH Eating Plan DASH stands for "Dietary Approaches to Stop Hypertension." The DASH eating plan is a healthy eating plan that has   been shown to reduce high blood pressure (hypertension). Additional health benefits may include reducing the risk of type 2 diabetes mellitus, heart disease, and stroke. The DASH eating plan may also help with weight loss. WHAT DO I NEED TO KNOW ABOUT THE DASH EATING PLAN? For the DASH eating plan, you will follow these general guidelines:  Choose foods with a percent daily value for sodium of less than 5% (as listed on the food label).  Use salt-free seasonings or herbs instead of table salt or sea salt.  Check with your health care provider or pharmacist before using salt substitutes.  Eat lower-sodium products, often labeled as "lower sodium" or "no salt added."  Eat fresh foods.  Eat more vegetables, fruits, and low-fat dairy  products.  Choose whole grains. Look for the word "whole" as the first word in the ingredient list.  Choose fish and skinless chicken or turkey more often than red meat. Limit fish, poultry, and meat to 6 oz (170 g) each day.  Limit sweets, desserts, sugars, and sugary drinks.  Choose heart-healthy fats.  Limit cheese to 1 oz (28 g) per day.  Eat more home-cooked food and less restaurant, buffet, and fast food.  Limit fried foods.  Cook foods using methods other than frying.  Limit canned vegetables. If you do use them, rinse them well to decrease the sodium.  When eating at a restaurant, ask that your food be prepared with less salt, or no salt if possible. WHAT FOODS CAN I EAT? Seek help from a dietitian for individual calorie needs. Grains Whole grain or whole wheat bread. Brown rice. Whole grain or whole wheat pasta. Quinoa, bulgur, and whole grain cereals. Low-sodium cereals. Corn or whole wheat flour tortillas. Whole grain cornbread. Whole grain crackers. Low-sodium crackers. Vegetables Fresh or frozen vegetables (raw, steamed, roasted, or grilled). Low-sodium or reduced-sodium tomato and vegetable juices. Low-sodium or reduced-sodium tomato sauce and paste. Low-sodium or reduced-sodium canned vegetables.  Fruits All fresh, canned (in natural juice), or frozen fruits. Meat and Other Protein Products Ground beef (85% or leaner), grass-fed beef, or beef trimmed of fat. Skinless chicken or turkey. Ground chicken or turkey. Pork trimmed of fat. All fish and seafood. Eggs. Dried beans, peas, or lentils. Unsalted nuts and seeds. Unsalted canned beans. Dairy Low-fat dairy products, such as skim or 1% milk, 2% or reduced-fat cheeses, low-fat ricotta or cottage cheese, or plain low-fat yogurt. Low-sodium or reduced-sodium cheeses. Fats and Oils Tub margarines without trans fats. Light or reduced-fat mayonnaise and salad dressings (reduced sodium). Avocado. Safflower, olive, or canola  oils. Natural peanut or almond butter. Other Unsalted popcorn and pretzels. The items listed above may not be a complete list of recommended foods or beverages. Contact your dietitian for more options. WHAT FOODS ARE NOT RECOMMENDED? Grains White bread. White pasta. White rice. Refined cornbread. Bagels and croissants. Crackers that contain trans fat. Vegetables Creamed or fried vegetables. Vegetables in a cheese sauce. Regular canned vegetables. Regular canned tomato sauce and paste. Regular tomato and vegetable juices. Fruits Dried fruits. Canned fruit in light or heavy syrup. Fruit juice. Meat and Other Protein Products Fatty cuts of meat. Ribs, chicken wings, bacon, sausage, bologna, salami, chitterlings, fatback, hot dogs, bratwurst, and packaged luncheon meats. Salted nuts and seeds. Canned beans with salt. Dairy Whole or 2% milk, cream, half-and-half, and cream cheese. Whole-fat or sweetened yogurt. Full-fat cheeses or blue cheese. Nondairy creamers and whipped toppings. Processed cheese, cheese spreads, or cheese curds. Condiments Onion and garlic salt,   seasoned salt, table salt, and sea salt. Canned and packaged gravies. Worcestershire sauce. Tartar sauce. Barbecue sauce. Teriyaki sauce. Soy sauce, including reduced sodium. Steak sauce. Fish sauce. Oyster sauce. Cocktail sauce. Horseradish. Ketchup and mustard. Meat flavorings and tenderizers. Bouillon cubes. Hot sauce. Tabasco sauce. Marinades. Taco seasonings. Relishes. Fats and Oils Butter, stick margarine, lard, shortening, ghee, and bacon fat. Coconut, palm kernel, or palm oils. Regular salad dressings. Other Pickles and olives. Salted popcorn and pretzels. The items listed above may not be a complete list of foods and beverages to avoid. Contact your dietitian for more information. WHERE CAN I FIND MORE INFORMATION? National Heart, Lung, and [employer] Institute: www.nhlbi.nih.gov/health/health-topics/topics/dash/ Document Released:  11/03/2011 Document Revised: 03/31/2014 Document Reviewed: 09/18/2013 ExitCare Patient Information 2015 [employer]. This information is not intended to replace advice given to you by your health care provider. Make sure you discuss any questions you have with your health care provider.  

## 2015-01-23 NOTE — Progress Notes (Signed)
MRN: [id number] Name: Jeremy PaganiniJames Verner  Sex: male Age: 69 y.o. DOB: 18-Dec-1945  Allergies: Fish allergy  Chief Complaint  Patient presents with  . Follow-up    HPI: Patient is 69 y.o. 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 Denies any chest pain.  Past Medical History  Diagnosis Date  . Medical history non-contributory   . DVT (deep venous thrombosis)   . Arthritis     Past Surgical History  Procedure Laterality Date  . Ankle surgery  1979    right ankle      Medication List       This list is accurate as of: 01/23/15  3:20 PM.  Always use your most recent med list.               atenolol 25 MG tablet  Commonly known as:  TENORMIN  Take 0.5 tablets (12.5 mg total) by mouth daily.     nicotine 21 mg/24hr patch  Commonly known as:  NICODERM CQ - dosed in mg/24 hours  Place 1 patch (21 mg total) onto the skin daily.     traMADol 50 MG tablet  Commonly known as:  ULTRAM  Take 1 tablet (50 mg total) by mouth every 6 (six) hours as needed for moderate pain.     warfarin 5 MG tablet  Commonly known as:  COUMADIN  Take 1 tablet (5 mg total) by mouth at bedtime.        Meds ordered this encounter  Medications  . atenolol (TENORMIN) 25 MG tablet    Sig: Take 0.5 tablets (12.5 mg total) by mouth daily.    Dispense:  90 tablet    Refill:  1     There is no immunization history on file for this  patient.  Family History  Problem Relation Age of Onset  . Stomach cancer Father   . Aneurysm    . Cancer Mother     History  Substance Use Topics  . Smoking status: Current Every Day Smoker -- 1.00 packs/day for 43 years    Types: Cigarettes  . Smokeless tobacco: Never Used  . Alcohol Use: No     Comment: quit in 2001    Review of Systems   As noted in HPI  Filed Vitals:   01/23/15 1415  BP: 158/94  Pulse: 106  Temp: 98.8 F (37.1 C)  Resp: 16    Physical Exam  Physical Exam  Constitutional: No distress.  Eyes: EOM are normal. Pupils are equal, round, and reactive to light.  Cardiovascular: Normal rate and regular rhythm.   Pulmonary/Chest: Breath sounds normal. No respiratory distress. He has no wheezes. He has no rales.  Musculoskeletal: He exhibits no edema.    CBC    Component Value Date/Time   WBC 7.7 09/22/2014 1158   RBC 3.32* 09/22/2014 1158   RBC 3.26* 07/28/2014 1023   HGB 13.7 09/22/2014  1158   HCT 40.0 09/22/2014 1158   PLT 206 09/22/2014 1158   MCV 120.5* 09/22/2014 1158   LYMPHSABS 1.7 09/22/2014 1158   MONOABS 0.7 09/22/2014 1158   EOSABS 0.0 09/22/2014 1158   BASOSABS 0.1 09/22/2014 1158    CMP     Component Value Date/Time   NA 140 07/28/2014 1023   K 4.3 07/28/2014 1023   CL 105 07/28/2014 1023   CO2 26 07/28/2014 1023   GLUCOSE 101* 07/28/2014 1023   BUN 9 07/28/2014 1023   CREATININE 0.52 07/28/2014 1023   CREATININE 0.58 07/08/2014 1214   CALCIUM 8.2* 07/28/2014 1023   PROT 6.3 07/28/2014 1023   ALBUMIN 3.4* 07/28/2014 1023   AST 33 07/28/2014 1023   ALT 17 07/28/2014 1023   ALKPHOS 76 07/28/2014 1023   BILITOT 0.8 07/28/2014 1023   GFRNONAA >89 07/28/2014 1023   GFRNONAA >90 07/08/2014 1214   GFRAA >89 07/28/2014 1023   GFRAA >90 07/08/2014 1214    Lab Results  Component Value Date/Time   CHOL  12/24/2010 06:10 AM    128        ATP III CLASSIFICATION:  <200     mg/dL   Desirable  960-454  mg/dL   Borderline  High  >=098    mg/dL   High           No components found for: HGA1C  Lab Results  Component Value Date/Time   AST 33 07/28/2014 10:23 AM    Assessment and Plan  Bilateral pulmonary embolism - Plan: patient is currently on Coumadin, he'll come back next week for INR check. CBC with Differential/Platelet  Palpitations - Plan:started patient on  atenolol (TENORMIN) 25 MG tablet, also check TSH, there is worsening symptoms patient will follow up with the cardiologist.  Essential hypertension - Plan:started patient on Tenormin COMPLETE METABOLIC PANEL WITH GFR     Return in about 3 months (around 04/23/2015) for hypertension, BP check when patient comes for INR check .   This note has been created with Education officer, environmental. Any transcriptional errors are unintentional.    Doris Cheadle, MD

## 2015-01-27 ENCOUNTER — Ambulatory Visit: Payer: Medicare Other | Attending: Internal Medicine | Admitting: Pharmacist

## 2015-01-27 ENCOUNTER — Telehealth: Payer: Self-pay

## 2015-01-27 DIAGNOSIS — I82409 Acute embolism and thrombosis of unspecified deep veins of unspecified lower extremity: Secondary | ICD-10-CM

## 2015-01-27 DIAGNOSIS — F17200 Nicotine dependence, unspecified, uncomplicated: Secondary | ICD-10-CM

## 2015-01-27 DIAGNOSIS — I2699 Other pulmonary embolism without acute cor pulmonale: Secondary | ICD-10-CM

## 2015-01-27 LAB — POCT INR: INR: 5.4

## 2015-01-27 NOTE — Telephone Encounter (Signed)
Patient not available Left message on voice mail to return our call 

## 2015-01-27 NOTE — Progress Notes (Signed)
Blood pressure rechecked with starting new medication Atenolol last week BP- 112/70 83

## 2015-01-27 NOTE — Telephone Encounter (Signed)
-----   Message from Lora PaulaJosalyn C Funches, MD sent at 01/26/2015  1:37 PM EST ----- Normal Hgb and platelets Normal TSH Slight elevated AST and ALT. Ask if patient drinking ETOH if so recommend cutting back. Recommend repeat LFTs in 4-6 weeks

## 2015-02-03 ENCOUNTER — Ambulatory Visit: Payer: Medicare Other | Attending: Internal Medicine | Admitting: Pharmacist

## 2015-02-03 DIAGNOSIS — I82402 Acute embolism and thrombosis of unspecified deep veins of left lower extremity: Secondary | ICD-10-CM

## 2015-02-03 DIAGNOSIS — I2699 Other pulmonary embolism without acute cor pulmonale: Secondary | ICD-10-CM

## 2015-02-03 DIAGNOSIS — F17200 Nicotine dependence, unspecified, uncomplicated: Secondary | ICD-10-CM

## 2015-02-03 DIAGNOSIS — I82409 Acute embolism and thrombosis of unspecified deep veins of unspecified lower extremity: Secondary | ICD-10-CM

## 2015-02-03 LAB — POCT INR: INR: 4.1

## 2015-02-03 NOTE — Patient Instructions (Signed)
Pt instructed to hold today and tomorrow coumadin dose then start low dose 2.5 mg tablet everyday; to return next Tuesday Pt states he took Xarelto in the past with side effects numb/tingling sensation in both feet/toes

## 2015-02-10 ENCOUNTER — Ambulatory Visit: Payer: Medicare Other | Attending: Internal Medicine | Admitting: Pharmacist

## 2015-02-10 DIAGNOSIS — F17200 Nicotine dependence, unspecified, uncomplicated: Secondary | ICD-10-CM

## 2015-02-10 DIAGNOSIS — I2699 Other pulmonary embolism without acute cor pulmonale: Secondary | ICD-10-CM

## 2015-02-10 DIAGNOSIS — I82409 Acute embolism and thrombosis of unspecified deep veins of unspecified lower extremity: Secondary | ICD-10-CM

## 2015-02-10 LAB — POCT INR: INR: 3.2

## 2015-02-17 ENCOUNTER — Ambulatory Visit: Payer: Medicare Other | Attending: Internal Medicine | Admitting: Pharmacist

## 2015-02-17 DIAGNOSIS — I2699 Other pulmonary embolism without acute cor pulmonale: Secondary | ICD-10-CM

## 2015-02-17 DIAGNOSIS — I82409 Acute embolism and thrombosis of unspecified deep veins of unspecified lower extremity: Secondary | ICD-10-CM | POA: Diagnosis not present

## 2015-02-17 DIAGNOSIS — F17200 Nicotine dependence, unspecified, uncomplicated: Secondary | ICD-10-CM

## 2015-02-17 LAB — POCT INR: INR: 3.5

## 2015-02-24 ENCOUNTER — Ambulatory Visit: Payer: Medicare Other | Attending: Internal Medicine | Admitting: Pharmacist

## 2015-02-24 DIAGNOSIS — I82402 Acute embolism and thrombosis of unspecified deep veins of left lower extremity: Secondary | ICD-10-CM | POA: Diagnosis not present

## 2015-02-24 DIAGNOSIS — I82409 Acute embolism and thrombosis of unspecified deep veins of unspecified lower extremity: Secondary | ICD-10-CM

## 2015-02-24 DIAGNOSIS — I2699 Other pulmonary embolism without acute cor pulmonale: Secondary | ICD-10-CM

## 2015-02-24 DIAGNOSIS — F17200 Nicotine dependence, unspecified, uncomplicated: Secondary | ICD-10-CM

## 2015-02-24 LAB — POCT INR: INR: 4.4

## 2015-02-24 NOTE — Patient Instructions (Signed)
Warfarin: What You Need to Know Warfarin is an anticoagulant. Anticoagulants help prevent the formation of blood clots. They also help stop the growth of blood clots. Warfarin is sometimes referred to as a "blood thinner."  Normally, when body tissues are cut or damaged, the blood clots in order to prevent blood loss. Sometimes clots form inside your blood vessels and obstruct the flow of blood through your circulatory system (thrombosis). These clots may travel through your bloodstream and become lodged in smaller blood vessels in your brain, which can cause a stroke, or in your lungs (pulmonary embolism). WHO SHOULD USE WARFARIN? Warfarin is prescribed for people at risk of developing harmful blood clots:  People with surgically implanted mechanical heart valves, irregular heart rhythms called atrial fibrillation, and certain clotting disorders.  People who have developed harmful blood clotting in the past, including those who have had a stroke or a pulmonary embolism, or thrombosis in their legs (deep vein thrombosis [DVT]).  People with an existing blood clot, such as a pulmonary embolism. WARFARIN DOSING Warfarin tablets come in different strengths. Each tablet strength is a different color, with the amount of warfarin (in milligrams) clearly printed on the tablet. If the color of your tablet is different than usual when you receive a new prescription, report it immediately to your pharmacist or health care provider. WARFARIN MONITORING The goal of warfarin therapy is to lessen the clotting tendency of blood but not prevent clotting completely. Your health care provider will monitor the anticoagulation effect of warfarin closely and adjust your dose as needed. For your safety, blood tests called prothrombin time (PT) or international normalized ratio (INR) are used to measure the effects of warfarin. Both of these tests can be done with a finger stick or a blood draw. The longer it takes the  blood to clot, the higher the PT or INR. Your health care provider will inform you of your "target" PT or INR range. If, at any time, your PT or INR is above the target range, there is a risk of bleeding. If your PT or INR is below the target range, there is a risk of clotting. Whether you are started on warfarin while you are in the hospital or in your health care provider's office, you will need to have your PT or INR checked within one week of starting the medicine. Initially, some people are asked to have their PT or INR checked as much as twice a week. Once you are on a stable maintenance dose, the PT or INR is checked less often, usually once every 2 to 4 weeks. The warfarin dose may be adjusted if the PT or INR is not within the target range. It is important to keep all laboratory and health care provider follow-up appointments. Not keeping appointments could result in a chronic or permanent injury, pain, or disability because warfarin is a medicine that requires close monitoring. WHAT ARE THE SIDE EFFECTS OF WARFARIN?  Too much warfarin can cause bleeding (hemorrhage) from any part of the body. This may include bleeding from the gums, blood in the urine, bloody or dark stools, a nosebleed that is not easily stopped, coughing up blood, or vomiting blood.  Too little warfarin can increase the risk of blood clots.  Too little or too much warfarin can also increase the risk of a stroke.  Warfarin use may cause a skin rash or irritation, an unusual fever, continual nausea or stomach upset, or severe pain in your joints or back.   SPECIAL PRECAUTIONS WHILE TAKING WARFARIN Warfarin should be taken exactly as directed. It is very important to take warfarin as directed since bleeding or blood clots could result in chronic or permanent injury, pain, or disability.  Take your medicine at the same time every day. If you forget to take your dose, you can take it if it is within 6 hours of when it was  due.  Do not change the dose of warfarin on your own to make up for missed or extra doses.  If you miss more than 2 doses in a row, you should contact your health care provider for advice. Avoid situations that cause bleeding. You may have a tendency to bleed more easily than usual while taking warfarin. The following actions can limit bleeding:  Using a softer toothbrush.  Flossing with waxed floss rather than unwaxed floss.  Shaving with an electric razor rather than a blade.  Limiting the use of sharp objects.  Avoiding potentially harmful activities, such as contact sports. Warfarin and Pregnancy or Breastfeeding  Warfarin is not advised during the first trimester of pregnancy due to an increased risk of birth defects. In certain situations, a woman may take warfarin after her first trimester of pregnancy. A woman who becomes pregnant or plans to become pregnant while taking warfarin should notify her health care provider immediately.  Although warfarin does not pass into breast milk, a woman who wishes to breastfeed while taking warfarin should also consult with her health care provider. Alcohol, Smoking, and Illicit Drug Use  Alcohol affects how warfarin works in the body. It is best to avoid alcoholic drinks or consume very small amounts while taking warfarin. In general, alcohol intake should be limited to 1 oz (30 mL) of liquor, 6 oz (180 mL) of wine, or 12 oz (360 mL) of beer each day. Notify your health care provider if you change your alcohol intake.  Smoking affects how warfarin works. It is best to avoid smoking while taking warfarin. Notify your health care provider if you change your smoking habits.  It is best to avoid all illicit drugs while taking warfarin since there are few studies that show how warfarin interacts with these drugs. Other Medicines and Dietary Supplements Many prescription and over-the-counter medicines can interfere with warfarin. Be sure all of your  health care providers know you are taking warfarin. Notify your health care provider who prescribed warfarin for you or your pharmacist before starting or stopping any new medicines, including over-the-counter vitamins, dietary supplements, and pain medicines. Your warfarin dose may need to be adjusted. Some common over-the-counter medicines that may increase the risk of bleeding while taking warfarin include:   Acetaminophen.  Aspirin.  Nonsteroidal anti-inflammatory medicines (NSAIDs), such as ibuprofen or naproxen.  Vitamin E. Dietary Considerations  Foods that have moderate or high amounts of vitamin K can interfere with warfarin. Avoid major changes in your diet or notify your health care provider before changing your diet. Eat a consistent amount of foods that have moderate or high amounts of vitamin K. Eating less foods containing vitamin K can increase the risk of bleeding. Eating more foods containing vitamin K can increase the risk of blood clots. Additional questions about dietary considerations can be discussed with a dietitian. Foods that are very high in vitamin K:  Greens, such as Swiss chard and beet, collard, mustard, or turnip greens (fresh or frozen, cooked).  Kale (fresh or frozen, cooked).  Parsley (raw).  Spinach (cooked). Foods that are high   in vitamin K:  Asparagus (frozen, cooked).  Beans, green (frozen, cooked).  Broccoli.  Bok choy (cooked).  Brussels sprouts (fresh or frozen, cooked).  Cabbage (cooked).   Coleslaw. Foods that are moderately high in vitamin K:  Blueberries.  Black-eyed peas.  Endive (raw).  Green leaf lettuce (raw).  Green scallions (raw).  Kale (raw).  Okra (frozen, cooked).  Plantains (fried).  Romaine lettuce (raw).  Sauerkraut (canned).  Spinach (raw). CALL YOUR CLINIC OR HEALTH CARE PROVIDER IF YOU:  Plan to have any surgery or procedure.  Feel sick, especially if you have diarrhea or  vomiting.  Experience or anticipate any major changes in your diet.  Start or stop a prescription or over-the-counter medicine.  Become, plan to become, or think you may be pregnant.  Are having heavier than usual menstrual periods.  Have had a fall, accident, or any symptoms of bleeding or unusual bruising.  Develop an unusual fever. CALL 911 IN THE [place] OR GO TO THE EMERGENCY DEPARTMENT IF YOU:   Think you may be having an allergic reaction to warfarin. The signs of an allergic reaction could include itching, rash, hives, swelling, chest tightness, or trouble breathing.  See signs of blood in your urine. The signs could include reddish, pinkish, or tea-colored urine.  See signs of blood in your stools. The signs could include bright red or black stools.  Vomit or cough up blood. In these instances, the blood could have either a bright red or a "coffee-grounds" appearance.  Have bleeding that will not stop after applying pressure for 30 minutes such as cuts, nosebleeds, or other injuries.  Have severe pain in your joints or back.  Have a new and severe headache.  Have sudden weakness or numbness of your face, arm, or leg, especially on one side of your body.  Have sudden confusion or trouble understanding.  Have sudden trouble seeing in one or both eyes.  Have sudden trouble walking, dizziness, loss of balance, or coordination.  Have trouble speaking or understanding (aphasia). Document Released: 11/14/2005 Document Revised: 03/31/2014 Document Reviewed: 05/10/2013 Exit[employer] Patient Information 2015 [employer]. This information is not intended to replace advice given to you by your health care provider. Make sure you discuss any questions you have with your health care provider.  

## 2015-03-03 ENCOUNTER — Ambulatory Visit: Payer: Medicare Other | Attending: Internal Medicine | Admitting: Pharmacist

## 2015-03-03 DIAGNOSIS — Z720 Tobacco use: Secondary | ICD-10-CM

## 2015-03-03 DIAGNOSIS — I82409 Acute embolism and thrombosis of unspecified deep veins of unspecified lower extremity: Secondary | ICD-10-CM

## 2015-03-03 DIAGNOSIS — I2699 Other pulmonary embolism without acute cor pulmonale: Secondary | ICD-10-CM

## 2015-03-03 DIAGNOSIS — F17200 Nicotine dependence, unspecified, uncomplicated: Secondary | ICD-10-CM

## 2015-03-03 LAB — POCT INR: INR: 2.3

## 2015-03-10 ENCOUNTER — Ambulatory Visit: Payer: Medicare Other | Attending: Internal Medicine | Admitting: Pharmacist

## 2015-03-10 DIAGNOSIS — I82409 Acute embolism and thrombosis of unspecified deep veins of unspecified lower extremity: Secondary | ICD-10-CM

## 2015-03-10 DIAGNOSIS — Z720 Tobacco use: Secondary | ICD-10-CM | POA: Diagnosis not present

## 2015-03-10 DIAGNOSIS — I2699 Other pulmonary embolism without acute cor pulmonale: Secondary | ICD-10-CM | POA: Diagnosis not present

## 2015-03-10 DIAGNOSIS — F17200 Nicotine dependence, unspecified, uncomplicated: Secondary | ICD-10-CM

## 2015-03-10 LAB — POCT INR: INR: 1.9

## 2015-03-19 ENCOUNTER — Ambulatory Visit: Payer: Medicare Other | Attending: Internal Medicine | Admitting: Pharmacist

## 2015-03-19 DIAGNOSIS — F17200 Nicotine dependence, unspecified, uncomplicated: Secondary | ICD-10-CM

## 2015-03-19 DIAGNOSIS — I2699 Other pulmonary embolism without acute cor pulmonale: Secondary | ICD-10-CM | POA: Diagnosis not present

## 2015-03-19 DIAGNOSIS — Z720 Tobacco use: Secondary | ICD-10-CM | POA: Diagnosis not present

## 2015-03-19 DIAGNOSIS — I82409 Acute embolism and thrombosis of unspecified deep veins of unspecified lower extremity: Secondary | ICD-10-CM | POA: Diagnosis not present

## 2015-03-19 LAB — POCT INR: INR: 2

## 2015-04-02 ENCOUNTER — Ambulatory Visit: Payer: Medicare Other | Attending: Internal Medicine | Admitting: Pharmacist

## 2015-04-02 DIAGNOSIS — F17200 Nicotine dependence, unspecified, uncomplicated: Secondary | ICD-10-CM

## 2015-04-02 DIAGNOSIS — I82409 Acute embolism and thrombosis of unspecified deep veins of unspecified lower extremity: Secondary | ICD-10-CM | POA: Diagnosis not present

## 2015-04-02 DIAGNOSIS — Z720 Tobacco use: Secondary | ICD-10-CM | POA: Diagnosis not present

## 2015-04-02 DIAGNOSIS — I2699 Other pulmonary embolism without acute cor pulmonale: Secondary | ICD-10-CM | POA: Diagnosis not present

## 2015-04-02 LAB — POCT INR: INR: 2.3

## 2015-04-28 ENCOUNTER — Ambulatory Visit: Payer: Medicare Other | Attending: Internal Medicine | Admitting: Pharmacist

## 2015-04-28 DIAGNOSIS — F17200 Nicotine dependence, unspecified, uncomplicated: Secondary | ICD-10-CM

## 2015-04-28 DIAGNOSIS — I2699 Other pulmonary embolism without acute cor pulmonale: Secondary | ICD-10-CM

## 2015-04-28 DIAGNOSIS — I82409 Acute embolism and thrombosis of unspecified deep veins of unspecified lower extremity: Secondary | ICD-10-CM

## 2015-04-28 LAB — POCT INR: INR: 2.9

## 2015-05-26 ENCOUNTER — Ambulatory Visit: Payer: Medicare Other | Attending: Internal Medicine | Admitting: Pharmacist

## 2015-05-26 DIAGNOSIS — Z720 Tobacco use: Secondary | ICD-10-CM | POA: Diagnosis not present

## 2015-05-26 DIAGNOSIS — I82403 Acute embolism and thrombosis of unspecified deep veins of lower extremity, bilateral: Secondary | ICD-10-CM | POA: Diagnosis not present

## 2015-05-26 DIAGNOSIS — I2699 Other pulmonary embolism without acute cor pulmonale: Secondary | ICD-10-CM

## 2015-05-26 DIAGNOSIS — F17200 Nicotine dependence, unspecified, uncomplicated: Secondary | ICD-10-CM

## 2015-05-26 LAB — POCT INR: INR: 3.4

## 2015-05-26 MED ORDER — WARFARIN SODIUM 5 MG PO TABS: ORAL_TABLET | ORAL | Status: DC

## 2015-06-09 ENCOUNTER — Ambulatory Visit: Payer: Medicare Other | Attending: Internal Medicine | Admitting: Pharmacist

## 2015-06-09 DIAGNOSIS — I82403 Acute embolism and thrombosis of unspecified deep veins of lower extremity, bilateral: Secondary | ICD-10-CM | POA: Diagnosis not present

## 2015-06-09 DIAGNOSIS — I2699 Other pulmonary embolism without acute cor pulmonale: Secondary | ICD-10-CM | POA: Diagnosis not present

## 2015-06-09 DIAGNOSIS — Z720 Tobacco use: Secondary | ICD-10-CM

## 2015-06-09 DIAGNOSIS — F17200 Nicotine dependence, unspecified, uncomplicated: Secondary | ICD-10-CM

## 2015-06-09 LAB — POCT INR: INR: 2.6

## 2015-07-07 ENCOUNTER — Encounter: Payer: Medicare Other | Admitting: Pharmacist

## 2015-07-07 ENCOUNTER — Ambulatory Visit: Payer: Medicare Other | Attending: Family Medicine | Admitting: Pharmacist

## 2015-07-07 DIAGNOSIS — F17200 Nicotine dependence, unspecified, uncomplicated: Secondary | ICD-10-CM

## 2015-07-07 DIAGNOSIS — I2699 Other pulmonary embolism without acute cor pulmonale: Secondary | ICD-10-CM

## 2015-07-07 DIAGNOSIS — I82403 Acute embolism and thrombosis of unspecified deep veins of lower extremity, bilateral: Secondary | ICD-10-CM

## 2015-07-07 LAB — POCT INR: INR: 4

## 2015-07-14 ENCOUNTER — Ambulatory Visit: Payer: Medicare Other | Attending: Family Medicine | Admitting: Pharmacist

## 2015-07-14 DIAGNOSIS — I82403 Acute embolism and thrombosis of unspecified deep veins of lower extremity, bilateral: Secondary | ICD-10-CM

## 2015-07-14 DIAGNOSIS — F17200 Nicotine dependence, unspecified, uncomplicated: Secondary | ICD-10-CM

## 2015-07-14 DIAGNOSIS — I2699 Other pulmonary embolism without acute cor pulmonale: Secondary | ICD-10-CM

## 2015-07-14 LAB — POCT INR: INR: 2.7

## 2015-07-14 NOTE — Progress Notes (Signed)
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  Last INR 4.0 and warfarin was held x 1 day.  INR therapeutic today at 2.7.  Pt reports no s/sx of bleeding, diet changes, or extra/missed doses.  Will continue 2.5 mg daily and f/u INR in 1 week.  Will consider 4-6 week INR if therapeutic at next visit.

## 2015-07-21 ENCOUNTER — Ambulatory Visit: Payer: Medicare Other | Attending: Family Medicine | Admitting: Pharmacist

## 2015-07-21 DIAGNOSIS — I2699 Other pulmonary embolism without acute cor pulmonale: Secondary | ICD-10-CM

## 2015-07-21 DIAGNOSIS — F17200 Nicotine dependence, unspecified, uncomplicated: Secondary | ICD-10-CM

## 2015-07-21 DIAGNOSIS — I82403 Acute embolism and thrombosis of unspecified deep veins of lower extremity, bilateral: Secondary | ICD-10-CM

## 2015-07-21 LAB — POCT INR: INR: 3.1

## 2015-07-21 NOTE — Progress Notes (Signed)
     69 YOM on anticoagulation with warfarin for recurrent PE/DVT with an INR goal of 2-3.  INR slightly above therapeutic range at 3.1.  Pt reports no s/sx of bleeding, diet changes, medication changes, or extra/missed doses.  Will continue 2.5 mg daily and F/U INR in 2 weeks.

## 2015-08-04 ENCOUNTER — Ambulatory Visit: Payer: Medicare Other | Attending: Family Medicine | Admitting: Pharmacist

## 2015-08-04 DIAGNOSIS — R002 Palpitations: Secondary | ICD-10-CM

## 2015-08-04 DIAGNOSIS — I2699 Other pulmonary embolism without acute cor pulmonale: Secondary | ICD-10-CM

## 2015-08-04 DIAGNOSIS — I82403 Acute embolism and thrombosis of unspecified deep veins of lower extremity, bilateral: Secondary | ICD-10-CM

## 2015-08-04 DIAGNOSIS — F17200 Nicotine dependence, unspecified, uncomplicated: Secondary | ICD-10-CM

## 2015-08-04 LAB — POCT INR: INR: 4.4

## 2015-08-04 MED ORDER — ATENOLOL 25 MG PO TABS: 12.5000 mg | ORAL_TABLET | Freq: Every day | ORAL | Status: DC

## 2015-08-11 ENCOUNTER — Ambulatory Visit: Payer: Medicare Other | Attending: Family Medicine | Admitting: Pharmacist

## 2015-08-11 DIAGNOSIS — I2699 Other pulmonary embolism without acute cor pulmonale: Secondary | ICD-10-CM

## 2015-08-11 DIAGNOSIS — I82403 Acute embolism and thrombosis of unspecified deep veins of lower extremity, bilateral: Secondary | ICD-10-CM

## 2015-08-11 DIAGNOSIS — F17200 Nicotine dependence, unspecified, uncomplicated: Secondary | ICD-10-CM

## 2015-08-11 LAB — POCT INR: INR: 3

## 2015-08-18 ENCOUNTER — Ambulatory Visit: Payer: Medicare Other | Attending: Internal Medicine | Admitting: Pharmacist

## 2015-08-18 DIAGNOSIS — I82403 Acute embolism and thrombosis of unspecified deep veins of lower extremity, bilateral: Secondary | ICD-10-CM

## 2015-08-18 DIAGNOSIS — I2699 Other pulmonary embolism without acute cor pulmonale: Secondary | ICD-10-CM

## 2015-08-18 DIAGNOSIS — F17200 Nicotine dependence, unspecified, uncomplicated: Secondary | ICD-10-CM

## 2015-08-18 LAB — POCT INR: INR: 4.4

## 2015-08-18 MED ORDER — WARFARIN SODIUM 2 MG PO TABS: 2.0000 mg | ORAL_TABLET | Freq: Every day | ORAL | Status: DC

## 2015-08-25 ENCOUNTER — Ambulatory Visit: Payer: Medicare Other | Attending: Internal Medicine | Admitting: Pharmacist

## 2015-08-25 DIAGNOSIS — F17200 Nicotine dependence, unspecified, uncomplicated: Secondary | ICD-10-CM

## 2015-08-25 DIAGNOSIS — I2699 Other pulmonary embolism without acute cor pulmonale: Secondary | ICD-10-CM

## 2015-08-25 DIAGNOSIS — I82403 Acute embolism and thrombosis of unspecified deep veins of lower extremity, bilateral: Secondary | ICD-10-CM

## 2015-08-25 LAB — POCT INR: INR: 3.8

## 2015-09-01 ENCOUNTER — Ambulatory Visit: Payer: Medicare Other | Attending: Internal Medicine | Admitting: Pharmacist

## 2015-09-01 DIAGNOSIS — F17200 Nicotine dependence, unspecified, uncomplicated: Secondary | ICD-10-CM

## 2015-09-01 DIAGNOSIS — I2699 Other pulmonary embolism without acute cor pulmonale: Secondary | ICD-10-CM

## 2015-09-01 LAB — POCT INR: INR: 1.9

## 2015-09-08 ENCOUNTER — Ambulatory Visit: Payer: Medicare Other | Attending: Internal Medicine | Admitting: Pharmacist

## 2015-09-08 DIAGNOSIS — F17200 Nicotine dependence, unspecified, uncomplicated: Secondary | ICD-10-CM

## 2015-09-08 DIAGNOSIS — I2699 Other pulmonary embolism without acute cor pulmonale: Secondary | ICD-10-CM

## 2015-09-08 LAB — POCT INR: INR: 1.9

## 2015-09-22 ENCOUNTER — Ambulatory Visit: Payer: Medicare Other | Attending: Family Medicine | Admitting: Pharmacist

## 2015-09-22 DIAGNOSIS — I2699 Other pulmonary embolism without acute cor pulmonale: Secondary | ICD-10-CM

## 2015-09-22 DIAGNOSIS — F17200 Nicotine dependence, unspecified, uncomplicated: Secondary | ICD-10-CM

## 2015-09-22 LAB — POCT INR: INR: 1.7

## 2015-09-29 ENCOUNTER — Ambulatory Visit: Payer: Medicare Other | Attending: Family Medicine | Admitting: Pharmacist

## 2015-09-29 DIAGNOSIS — F17200 Nicotine dependence, unspecified, uncomplicated: Secondary | ICD-10-CM

## 2015-09-29 DIAGNOSIS — I2699 Other pulmonary embolism without acute cor pulmonale: Secondary | ICD-10-CM

## 2015-09-29 LAB — POCT INR: INR: 1.9

## 2015-10-06 ENCOUNTER — Ambulatory Visit: Payer: Medicare Other | Attending: Family Medicine | Admitting: Pharmacist

## 2015-10-06 DIAGNOSIS — I2699 Other pulmonary embolism without acute cor pulmonale: Secondary | ICD-10-CM

## 2015-10-06 DIAGNOSIS — F17200 Nicotine dependence, unspecified, uncomplicated: Secondary | ICD-10-CM

## 2015-10-06 LAB — POCT INR: INR: 2.4

## 2015-10-20 ENCOUNTER — Ambulatory Visit: Payer: Medicare Other | Attending: Family Medicine | Admitting: Pharmacist

## 2015-10-20 DIAGNOSIS — F17200 Nicotine dependence, unspecified, uncomplicated: Secondary | ICD-10-CM

## 2015-10-20 DIAGNOSIS — I2699 Other pulmonary embolism without acute cor pulmonale: Secondary | ICD-10-CM

## 2015-10-20 LAB — POCT INR: INR: 2

## 2015-10-22 ENCOUNTER — Inpatient Hospital Stay (HOSPITAL_COMMUNITY)
Admission: EM | Admit: 2015-10-22 | Discharge: 2015-10-27 | DRG: 536 | Disposition: A | Discharge status: Skilled Nursing Facility | Payer: Medicare Other | Attending: Internal Medicine | Admitting: Internal Medicine

## 2015-10-22 ENCOUNTER — Emergency Department (HOSPITAL_COMMUNITY): Payer: Medicare Other

## 2015-10-22 DIAGNOSIS — S72111A Displaced fracture of greater trochanter of right femur, initial encounter for closed fracture: Secondary | ICD-10-CM | POA: Diagnosis not present

## 2015-10-22 DIAGNOSIS — I10 Essential (primary) hypertension: Secondary | ICD-10-CM | POA: Diagnosis present

## 2015-10-22 DIAGNOSIS — M25552 Pain in left hip: Secondary | ICD-10-CM | POA: Insufficient documentation

## 2015-10-22 DIAGNOSIS — E876 Hypokalemia: Secondary | ICD-10-CM | POA: Diagnosis present

## 2015-10-22 DIAGNOSIS — W19XXXA Unspecified fall, initial encounter: Secondary | ICD-10-CM

## 2015-10-22 DIAGNOSIS — D696 Thrombocytopenia, unspecified: Secondary | ICD-10-CM | POA: Diagnosis present

## 2015-10-22 DIAGNOSIS — Z7901 Long term (current) use of anticoagulants: Secondary | ICD-10-CM

## 2015-10-22 DIAGNOSIS — Z720 Tobacco use: Secondary | ICD-10-CM | POA: Diagnosis present

## 2015-10-22 DIAGNOSIS — E871 Hypo-osmolality and hyponatremia: Secondary | ICD-10-CM | POA: Diagnosis present

## 2015-10-22 DIAGNOSIS — F17210 Nicotine dependence, cigarettes, uncomplicated: Secondary | ICD-10-CM | POA: Diagnosis present

## 2015-10-22 DIAGNOSIS — Z91013 Allergy to seafood: Secondary | ICD-10-CM

## 2015-10-22 DIAGNOSIS — I9589 Other hypotension: Secondary | ICD-10-CM | POA: Diagnosis present

## 2015-10-22 DIAGNOSIS — W010XXA Fall on same level from slipping, tripping and stumbling without subsequent striking against object, initial encounter: Secondary | ICD-10-CM | POA: Diagnosis present

## 2015-10-22 DIAGNOSIS — R000 Tachycardia, unspecified: Secondary | ICD-10-CM | POA: Diagnosis present

## 2015-10-22 DIAGNOSIS — D72829 Elevated white blood cell count, unspecified: Secondary | ICD-10-CM | POA: Diagnosis present

## 2015-10-22 DIAGNOSIS — Z800 Family history of malignant neoplasm of digestive organs: Secondary | ICD-10-CM

## 2015-10-22 DIAGNOSIS — M1990 Unspecified osteoarthritis, unspecified site: Secondary | ICD-10-CM | POA: Diagnosis present

## 2015-10-22 DIAGNOSIS — S72009A Fracture of unspecified part of neck of unspecified femur, initial encounter for closed fracture: Secondary | ICD-10-CM | POA: Diagnosis present

## 2015-10-22 DIAGNOSIS — R1031 Right lower quadrant pain: Secondary | ICD-10-CM | POA: Diagnosis not present

## 2015-10-22 DIAGNOSIS — Z86711 Personal history of pulmonary embolism: Secondary | ICD-10-CM

## 2015-10-22 DIAGNOSIS — Z79899 Other long term (current) drug therapy: Secondary | ICD-10-CM

## 2015-10-22 DIAGNOSIS — D531 Other megaloblastic anemias, not elsewhere classified: Secondary | ICD-10-CM | POA: Diagnosis present

## 2015-10-22 DIAGNOSIS — Y92488 Other paved roadways as the place of occurrence of the external cause: Secondary | ICD-10-CM

## 2015-10-22 DIAGNOSIS — Z86718 Personal history of other venous thrombosis and embolism: Secondary | ICD-10-CM

## 2015-10-22 DIAGNOSIS — S72001A Fracture of unspecified part of neck of right femur, initial encounter for closed fracture: Secondary | ICD-10-CM

## 2015-10-22 HISTORY — DX: Other pulmonary embolism without acute cor pulmonale: I26.99

## 2015-10-22 HISTORY — DX: Long term (current) use of anticoagulants: Z79.01

## 2015-10-22 LAB — CBC
HCT: 41 % (ref 39.0–52.0)
Hemoglobin: 14.1 g/dL (ref 13.0–17.0)
MCH: 41.3 pg — ABNORMAL HIGH (ref 26.0–34.0)
MCHC: 34.4 g/dL (ref 30.0–36.0)
MCV: 120.2 fL — ABNORMAL HIGH (ref 78.0–100.0)
Platelets: 126 K/uL — ABNORMAL LOW (ref 150–400)
RBC: 3.41 MIL/uL — ABNORMAL LOW (ref 4.22–5.81)
RDW: 14.7 % (ref 11.5–15.5)
WBC: 11 K/uL — ABNORMAL HIGH (ref 4.0–10.5)

## 2015-10-22 LAB — PROTIME-INR
INR: 1.42 (ref 0.00–1.49)
Prothrombin Time: 17.5 seconds — ABNORMAL HIGH (ref 11.6–15.2)

## 2015-10-22 MED ORDER — ATENOLOL 12.5 MG HALF TABLET
12.5000 mg | ORAL_TABLET | Freq: Every day | ORAL | Status: DC
  Administered 2015-10-22 – 2015-10-27 (×6): 12.5 mg via ORAL
  Filled 2015-10-22 (×9): qty 1

## 2015-10-22 MED ORDER — WARFARIN SODIUM 2 MG PO TABS: 2.0000 mg | ORAL_TABLET | Freq: Every day | ORAL | Status: DC

## 2015-10-22 MED ORDER — ACETAMINOPHEN 500 MG PO TABS
1000.0000 mg | ORAL_TABLET | Freq: Four times a day (QID) | ORAL | Status: DC | PRN
  Administered 2015-10-24 – 2015-10-25 (×3): 1000 mg via ORAL
  Filled 2015-10-22 (×3): qty 2

## 2015-10-22 MED ORDER — WARFARIN - PHARMACIST DOSING INPATIENT: Freq: Every day | Status: DC

## 2015-10-22 MED ORDER — TRAMADOL HCL 50 MG PO TABS
50.0000 mg | ORAL_TABLET | Freq: Four times a day (QID) | ORAL | Status: DC | PRN
Start: 2015-10-22 — End: 2015-10-24
  Administered 2015-10-22 – 2015-10-23 (×2): 50 mg via ORAL
  Filled 2015-10-22 (×2): qty 1

## 2015-10-22 MED ORDER — TRAMADOL HCL 50 MG PO TABS: 50.0000 mg | ORAL_TABLET | Freq: Four times a day (QID) | ORAL | Status: DC | PRN

## 2015-10-22 MED ORDER — NICOTINE 21 MG/24HR TD PT24: 21.0000 mg | MEDICATED_PATCH | Freq: Every day | TRANSDERMAL | Status: DC

## 2015-10-22 MED ORDER — WARFARIN SODIUM 4 MG PO TABS
4.5000 mg | ORAL_TABLET | Freq: Once | ORAL | Status: AC
  Administered 2015-10-22: 4.5 mg via ORAL
  Filled 2015-10-22 (×2): qty 1

## 2015-10-22 NOTE — ED Provider Notes (Addendum)
CSN: [id number]     Arrival date & time 10/22/15  1202 History   First MD Initiated Contact with Patient 10/22/15 1210     Chief Complaint  Patient presents with  . Groin Pain  . Fatigue     (Consider location/radiation/quality/duration/timing/severity/associated sxs/prior Treatment) HPI Patient presents with concern of right inguinal crease pain. Patient states that he had a fall yesterday, since that time has had pain persistently in the right inguinal area. There is mild ongoing generalized weakness, but no new focal weakness, including in the left lower leg. Patient acknowledges a history of DVT, takes Coumadin regularly. He denies headache, confusion, disorientation, other complaints. Pain is worse with ambulation, pressure. No medication taken for pain relief. Pain is sore, severe. Past Medical History  Diagnosis Date  . Medical history non-contributory   . DVT (deep venous thrombosis)   . Arthritis    Past Surgical History  Procedure Laterality Date  . Ankle surgery  1979    right ankle   Family History  Problem Relation Age of Onset  . Stomach cancer Father   . Aneurysm    . Cancer Mother    Social History  Substance Use Topics  . Smoking status: Current Every Day Smoker -- 1.00 packs/day for 43 years    Types: Cigarettes  . Smokeless tobacco: Never Used  . Alcohol Use: No     Comment: quit in 2001    Review of Systems  Constitutional:       Per HPI, otherwise negative  HENT:       Per HPI, otherwise negative  Respiratory:       Per HPI, otherwise negative  Cardiovascular:       Intermittent episodes of palpitations, typical for him, no recent issues.  Gastrointestinal: Negative for vomiting.  Endocrine:       Negative aside from HPI  Genitourinary:       Neg aside from HPI   Musculoskeletal:       Per HPI, otherwise negative  Skin: Negative.   Neurological: Negative for syncope.      Allergies  Fish allergy  Home Medications   Prior  to Admission medications   Medication Sig Start Date End Date Taking? Authorizing Provider  atenolol (TENORMIN) 25 MG tablet Take 0.5 tablets (12.5 mg total) by mouth daily. 08/04/15   Quentin Angst, MD  nicotine (NICODERM CQ - DOSED IN MG/24 HOURS) 21 mg/24hr patch Place 1 patch (21 mg total) onto the skin daily. 07/09/14   Vassie Loll, MD  traMADol (ULTRAM) 50 MG tablet Take 1 tablet (50 mg total) by mouth every 6 (six) hours as needed for moderate pain. 07/09/14   Vassie Loll, MD  warfarin (COUMADIN) 2 MG tablet Take 1 tablet (2 mg total) by mouth daily. 08/18/15   Olugbemiga E Hyman Hopes, MD   BP 130/81 mmHg  Pulse 110  Temp(Src) 99.1 F (37.3 C) (Oral)  Resp 20  Ht 6' (1.829 m)  Wt 157 lb (71.215 kg)  BMI 21.29 kg/m2  SpO2 92% Physical Exam  Constitutional: He is oriented to person, place, and time. He appears well-developed. No distress.  HENT:  Head: Normocephalic and atraumatic.  Eyes: Conjunctivae and EOM are normal.  Cardiovascular: Normal rate and regular rhythm.   Pulmonary/Chest: Effort normal. No stridor. No respiratory distress.  Abdominal: He exhibits no distension. There is no tenderness.    Genitourinary: Penis normal. Right testis shows no swelling and no tenderness. Left testis shows no swelling.  Musculoskeletal:  He exhibits no edema.  Pelvis stable patient flexes each hip independently, with equal strength bilaterally. There is mild tenderness to palpation about the right inguinal crease.   Neurological: He is alert and oriented to person, place, and time.  Skin: Skin is warm and dry.  Multiple small skin tears, minimal active bleeding  Psychiatric: He has a normal mood and affect.  Nursing note and vitals reviewed.   ED Course  Procedures (including critical care time) Labs Review Labs Reviewed  PROTIME-INR - Abnormal; Notable for the following:    Prothrombin Time 17.5 (*)    All other components within normal limits      Imaging Review Dg  Pelvis 1-2 Views  10/22/2015  CLINICAL DATA:  Right groin pain for 2 days following fall. EXAM: PELVIS - 1-2 VIEW COMPARISON:  None. FINDINGS: Degenerative changes are seen at each hip joint, mild to moderate in degree on the right and mild in degree on the left, with associated mild joint space narrowings, osseous spurring and probable degenerative subchondral cyst formation. Degenerative changes also seen within the lower lumbar spine, mild to moderate in degree. There is a mild levoscoliosis within the lower lumbar spine. Overall osseous alignment is otherwise normal. No fracture line or displaced fracture fragment identified. No acute-appearing cortical irregularity or osseous lesion. Vascular atherosclerotic calcifications are seen within the pelvis. Soft tissues about the pelvis and hips are otherwise unremarkable. IMPRESSION: 1. 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 2. No acute findings. Electronically Signed   By: Bary RichardStan  Maynard M.D.   On: 10/22/2015 13:20   I have personally reviewed and evaluated these images and lab results as part of my medical decision-making.  2:09 PM Patient calm, states that he feels better.  On repeat exam the patient is in no distress. We discussed all findings, the need to follow-up with primary care and/or orthopedist if he continues to have pain in the groin. Patient will take additional dose of Coumadin today given his subtherapeutic INR.  VS notable for mild tachycardia - patient states that this is not unusual.   3:10 PM Patient unable to ambulate, 2/2 pain in R groin. He has no new complaints. I discussed his case with SW, who will assist with placement vs. Home health services in the AM. Patient had one episode of tachycardia on the monitor, but no Sx.  He again states that this happens frequently, and is not a current issue.  Pharmacy is going to assist w management of  the patient's INR MDM  Patient presents one day after a fall with concern of ongoing inguinal crease pain, right-sided. History the patient is distally neurovascularly intact, has no asymmetry of the legs, has a stable pelvis. No evidence for fracture on x-ray Patient remained hemodynamically stable throughout, but was unable to ambulate.  With this concern I discussed his case with our SW team to facilitate placement in rehab.     Gerhard Munchobert Treyvonne Tata, MD 10/22/15 1514  Gerhard Munchobert Katelind Pytel, MD 10/22/15 [phone]

## 2015-10-22 NOTE — ED Notes (Signed)
Bed: WA03 Expected date:  Expected time:  Means of arrival:  Comments: Ems-groin pain

## 2015-10-22 NOTE — Progress Notes (Signed)
ANTICOAGULATION CONSULT NOTE - Initial Consult  Pharmacy Consult for warfarin Indication: History of PE/DVT  Allergies  Allergen Reactions  . Fish Allergy Anaphylaxis and Other (See Comments)    Diarrhea     Patient Measurements: Height: 6' (182.9 cm) Weight: 157 lb (71.215 kg) IBW/kg (Calculated) : 77.6  Vital Signs: Temp: 99.7 F (37.6 C) (11/24 1421) Temp Source: Oral (11/24 1421) BP: 118/74 mmHg (11/24 1421) Pulse Rate: 115 (11/24 1421)  Labs:  Recent Labs  10/20/15 1132 10/22/15 1255 10/22/15 1300  HGB  --   --  14.1  HCT  --   --  41.0  PLT  --   --  126*  LABPROT  --  17.5*  --   INR 2.0 1.42  --     CrCl cannot be calculated (Patient has no serum creatinine result on file.).   Medical History: Past Medical History  Diagnosis Date  . Medical history non-contributory   . DVT (deep venous thrombosis)   . Arthritis     Medications:   (Not in a hospital admission)  Assessment: 69yo M admitted with R groin pain after falling the day before. Takes warfarin for hx of PE/DVT. Pharmacy is asked to manage. Home regimen reported as 2mg  daily except 3mg  on Tuesdays. INR is subtherapeutic on admission. Hgb is wnl. Platelets are a little low.  Goal of Therapy:  INR 2-3 Monitor platelets by anticoagulation protocol: Yes   Plan:  Give Coumadin 4.5mg  now. Check PT/INR daily.  Charolotte Ekeom Siddhant Hashemi, PharmD, pager [phone]. 10/22/2015,3:57 PM.

## 2015-10-22 NOTE — Discharge Instructions (Signed)
As discussed, to treat your ongoing groin pain, it is important to use ice, Tylenol.  Equally important, is to monitor your INR level. With today's low value, please take Coumadin, 3 mg, both tonight and tomorrow night, then you may resume your typical dosing. Please have your INR level checked again asked week.  Return here for concerning changes in your condition.

## 2015-10-22 NOTE — ED Notes (Signed)
Pt states he hasn't had anything to eat in the past couple days. Pt states he was leaving his barber yesterday and her shoe got caught on a piece of construction cord. Pt denies any LOC, but c/o severe right sided groin pain, and generalized weakness.  Pt states he is taking a blood thinner.

## 2015-10-23 LAB — PROTIME-INR
INR: 1.24 (ref 0.00–1.49)
Prothrombin Time: 15.8 seconds — ABNORMAL HIGH (ref 11.6–15.2)

## 2015-10-23 MED ORDER — WARFARIN SODIUM 3 MG PO TABS
3.0000 mg | ORAL_TABLET | Freq: Once | ORAL | Status: AC
  Administered 2015-10-23: 3 mg via ORAL
  Filled 2015-10-23 (×3): qty 1

## 2015-10-23 NOTE — ED Provider Notes (Addendum)
Pt in the ER for social concerns. He is not able to be independent at home. Case management, PT consults ordered. SW has already seen the patient. Pt awaiting recs from PT and case. Anticipate d/c with home rehab and PT.  Filed Vitals:   10/23/15 1911 10/23/15 1952  BP: 129/95   Pulse: 116   Temp:  98.7 F (37.1 C)  Resp: 21     1:54 AM CT shows right pelvic fracture. Will admit.  Derwood KaplanAnkit Cullen Lahaie, MD 10/24/15 0154   EKG Interpretation  Date/Time:  Saturday October 24 2015 03:00:41 EST Ventricular Rate:  101 PR Interval:  167 QRS Duration: 134 QT Interval:  404 QTC Calculation: 524 R Axis:   135 Text Interpretation:  Sinus tachycardia Atrial premature complex Right bundle branch block Anterior infarct, old No acute changes Confirmed by Rhunette CroftNANAVATI, MD, Janey GentaANKIT [id number]([id number]) on 10/24/2015 3:51:01 AM        Derwood KaplanAnkit Talayla Doyel, MD 10/24/15 [phone]

## 2015-10-23 NOTE — ED Provider Notes (Addendum)
Social work saw the patient and states that this time his insurance does not for inpatient rehabilitation. Patient does desire doing rehabilitation at home however when speaking with him currently does not feel safe going home tonight. He states he still feels very shaky when he gets up and he has no family or friends who can help him. He is complaining of some pain in his leg with ambulation but he has been getting intermittent Tylenol and tramadol. Will get a PT evaluation in the morning as well as case management to allow for home health.  4:09 PM After vicodin and when attempting to get up pt still unable to bare weight due to severe pain.  Will get CT to further evaluate and will get UA to ensure no other cause for pain.  Pt checked out to Dr. Rhunette CroftNanavati.  Gwyneth SproutWhitney Xaria Judon, MD 10/23/15 [phone]  Gwyneth SproutWhitney Melayna Robarts, MD 10/24/15 [phone]

## 2015-10-23 NOTE — Progress Notes (Signed)
ANTICOAGULATION CONSULT NOTE - Follow Up Consult  Pharmacy Consult for coumadin Indication: hx PE/DVT  Allergies  Allergen Reactions  . Fish Allergy Anaphylaxis and Other (See Comments)    Diarrhea     Patient Measurements: Height: 6' (182.9 cm) Weight: 157 lb (71.215 kg) IBW/kg (Calculated) : 77.6  Vital Signs: Temp: 98.9 F (37.2 C) (11/25 1037) Temp Source: Oral (11/25 1037) BP: 112/70 mmHg (11/25 1037) Pulse Rate: 96 (11/25 1037)  Labs:  Recent Labs  10/20/15 1132 10/22/15 1255 10/22/15 1300 10/23/15 0506  HGB  --   --  14.1  --   HCT  --   --  41.0  --   PLT  --   --  126*  --   LABPROT  --  17.5*  --  15.8*  INR 2.0 1.42  --  1.24    CrCl cannot be calculated (Patient has no serum creatinine result on file.).   Medications:  Coumadin 2mg  daily except 3mg  on Tuesdays  Assessment: Patient's a 69 y.o M, on coumadin PTA for hx PE/DVT, presented to the ED on 11/24 with c/o R groin pain.  Today, 10/23/2015: - INR sub-therapeutic at 1.24 - no new cbc today - no bleeding documented - no significant drug-drug intxns  Goal of Therapy:  INR 2-3    Plan:  - coumadin 3 mg PO x1 today - daily INR - monitor for s/s bleeding  Edla Para P 10/23/2015,11:27 AM

## 2015-10-23 NOTE — ED Notes (Signed)
Patient coumadin dose placed in patient bin of pyxis.

## 2015-10-23 NOTE — ED Notes (Signed)
Pt tried sitting up but to painful. Pt did not walk.

## 2015-10-23 NOTE — Progress Notes (Signed)
CSW met with patient at bedside. There was no family present.   Patient informed CSW that he presents to [facility] due to fall. Patient informed CSW that Wednesday he fell while getting his hair cut. Patient states that he lost his balance and tripped over a rope. Patient says that he over-extended his groin and that now it is in pain.  Patient states that when he fell he drove home, but soon realized that his groin was hurting severely. Patient states that he cannot walk well. Patient informed CSW that he has walked 41f, but states that he is now able to stand better than when fall initially happened.  Patient states that he feels he has a good support system. Patient states that he has 2 friends; DButch Pennyand CHarrie Jeanswould be able to come to his home and help him if he ask.  Patient states that prior to falling he was very independent, and was able to complete his ADL's. Patient states that he is employed and has his own apartment.   Patient informed CSW that he is not interested in SNF. Patient states that if rehab is needed he would like home health.  Patient states that he does not have any questions for CSW at this time.  CSW made EDP/ Dr.Plunkett aware. Dr.Plunkett has consulted Nurse CM.  BWillette [phone] CSW 10/23/2015 6:02 PM

## 2015-10-23 NOTE — ED Notes (Signed)
Spoke with SW regarding incomplete consult order placed 11/24 1450 and 1507; Jeraldine LootsLockwood reports consult of admit to rehab/nursing facility. SW reports en route to see pt.

## 2015-10-24 ENCOUNTER — Emergency Department (HOSPITAL_COMMUNITY): Payer: Medicare Other

## 2015-10-24 ENCOUNTER — Encounter (HOSPITAL_COMMUNITY): Payer: Self-pay | Admitting: Internal Medicine

## 2015-10-24 ENCOUNTER — Inpatient Hospital Stay (HOSPITAL_COMMUNITY): Payer: Medicare Other

## 2015-10-24 DIAGNOSIS — Z86711 Personal history of pulmonary embolism: Secondary | ICD-10-CM | POA: Diagnosis not present

## 2015-10-24 DIAGNOSIS — S72009A Fracture of unspecified part of neck of unspecified femur, initial encounter for closed fracture: Secondary | ICD-10-CM | POA: Diagnosis present

## 2015-10-24 DIAGNOSIS — Y92488 Other paved roadways as the place of occurrence of the external cause: Secondary | ICD-10-CM | POA: Diagnosis not present

## 2015-10-24 DIAGNOSIS — D696 Thrombocytopenia, unspecified: Secondary | ICD-10-CM | POA: Diagnosis present

## 2015-10-24 DIAGNOSIS — I9589 Other hypotension: Secondary | ICD-10-CM | POA: Diagnosis present

## 2015-10-24 DIAGNOSIS — S72001A Fracture of unspecified part of neck of right femur, initial encounter for closed fracture: Secondary | ICD-10-CM | POA: Diagnosis not present

## 2015-10-24 DIAGNOSIS — F17210 Nicotine dependence, cigarettes, uncomplicated: Secondary | ICD-10-CM | POA: Diagnosis present

## 2015-10-24 DIAGNOSIS — Z79899 Other long term (current) drug therapy: Secondary | ICD-10-CM | POA: Diagnosis not present

## 2015-10-24 DIAGNOSIS — Z86718 Personal history of other venous thrombosis and embolism: Secondary | ICD-10-CM | POA: Diagnosis not present

## 2015-10-24 DIAGNOSIS — Z7901 Long term (current) use of anticoagulants: Secondary | ICD-10-CM

## 2015-10-24 DIAGNOSIS — I4581 Long QT syndrome: Secondary | ICD-10-CM

## 2015-10-24 DIAGNOSIS — D72829 Elevated white blood cell count, unspecified: Secondary | ICD-10-CM | POA: Diagnosis present

## 2015-10-24 DIAGNOSIS — D531 Other megaloblastic anemias, not elsewhere classified: Secondary | ICD-10-CM | POA: Diagnosis present

## 2015-10-24 DIAGNOSIS — Z800 Family history of malignant neoplasm of digestive organs: Secondary | ICD-10-CM | POA: Diagnosis not present

## 2015-10-24 DIAGNOSIS — R000 Tachycardia, unspecified: Secondary | ICD-10-CM | POA: Diagnosis present

## 2015-10-24 DIAGNOSIS — S72111A Displaced fracture of greater trochanter of right femur, initial encounter for closed fracture: Secondary | ICD-10-CM | POA: Diagnosis present

## 2015-10-24 DIAGNOSIS — I10 Essential (primary) hypertension: Secondary | ICD-10-CM | POA: Diagnosis present

## 2015-10-24 DIAGNOSIS — E871 Hypo-osmolality and hyponatremia: Secondary | ICD-10-CM | POA: Diagnosis present

## 2015-10-24 DIAGNOSIS — Z720 Tobacco use: Secondary | ICD-10-CM | POA: Diagnosis present

## 2015-10-24 DIAGNOSIS — R1031 Right lower quadrant pain: Secondary | ICD-10-CM | POA: Diagnosis present

## 2015-10-24 DIAGNOSIS — M1990 Unspecified osteoarthritis, unspecified site: Secondary | ICD-10-CM | POA: Diagnosis present

## 2015-10-24 DIAGNOSIS — E876 Hypokalemia: Secondary | ICD-10-CM | POA: Diagnosis present

## 2015-10-24 DIAGNOSIS — Z91013 Allergy to seafood: Secondary | ICD-10-CM | POA: Diagnosis not present

## 2015-10-24 DIAGNOSIS — W010XXA Fall on same level from slipping, tripping and stumbling without subsequent striking against object, initial encounter: Secondary | ICD-10-CM | POA: Diagnosis present

## 2015-10-24 LAB — PROTIME-INR
INR: 1.2 (ref 0.00–1.49)
INR: 1.26 (ref 0.00–1.49)
PROTHROMBIN TIME: 15.9 s — AB (ref 11.6–15.2)
Prothrombin Time: 15.3 seconds — ABNORMAL HIGH (ref 11.6–15.2)

## 2015-10-24 LAB — URINE MICROSCOPIC-ADD ON
Bacteria, UA: NONE SEEN
RBC / HPF: NONE SEEN RBC/hpf (ref 0–5)

## 2015-10-24 LAB — URINALYSIS, ROUTINE W REFLEX MICROSCOPIC
GLUCOSE, UA: NEGATIVE mg/dL
Hgb urine dipstick: NEGATIVE
KETONES UR: 40 mg/dL — AB
NITRITE: POSITIVE — AB
PH: 7 (ref 5.0–8.0)
PROTEIN: NEGATIVE mg/dL
Specific Gravity, Urine: 1.024 (ref 1.005–1.030)

## 2015-10-24 LAB — BASIC METABOLIC PANEL
Anion gap: 7 (ref 5–15)
BUN: 12 mg/dL (ref 6–20)
CHLORIDE: 100 mmol/L — AB (ref 101–111)
CO2: 25 mmol/L (ref 22–32)
CREATININE: 0.52 mg/dL — AB (ref 0.61–1.24)
Calcium: 8.1 mg/dL — ABNORMAL LOW (ref 8.9–10.3)
GFR calc non Af Amer: >60 mL/min (ref >60)
Glucose, Bld: 108 mg/dL — ABNORMAL HIGH (ref 65–99)
POTASSIUM: 3.5 mmol/L (ref 3.5–5.1)
SODIUM: 132 mmol/L — AB (ref 135–145)

## 2015-10-24 LAB — CBC
HEMATOCRIT: 37.9 % — AB (ref 39.0–52.0)
HEMOGLOBIN: 13.2 g/dL (ref 13.0–17.0)
MCH: 41.5 pg — AB (ref 26.0–34.0)
MCHC: 34.8 g/dL (ref 30.0–36.0)
MCV: 119.2 fL — AB (ref 78.0–100.0)
Platelets: 123 10*3/uL — ABNORMAL LOW (ref 150–400)
RBC: 3.18 MIL/uL — AB (ref 4.22–5.81)
RDW: 14.4 % (ref 11.5–15.5)
WBC: 12.5 10*3/uL — AB (ref 4.0–10.5)

## 2015-10-24 LAB — ABO/RH: ABO/RH(D): O POS

## 2015-10-24 LAB — HEPARIN LEVEL (UNFRACTIONATED): Heparin Unfractionated: 0.24 IU/mL — ABNORMAL LOW (ref 0.30–0.70)

## 2015-10-24 LAB — APTT: aPTT: 33 seconds (ref 24–37)

## 2015-10-24 MED ORDER — MORPHINE SULFATE (PF) 2 MG/ML IV SOLN
2.0000 mg | INTRAVENOUS | Status: DC | PRN
Start: 2015-10-24 — End: 2015-10-27
  Administered 2015-10-24: 2 mg via INTRAVENOUS
  Filled 2015-10-24: qty 1

## 2015-10-24 MED ORDER — METOPROLOL TARTRATE 1 MG/ML IV SOLN
5.0000 mg | Freq: Four times a day (QID) | INTRAVENOUS | Status: DC | PRN
  Filled 2015-10-24: qty 5

## 2015-10-24 MED ORDER — HYDROCODONE-ACETAMINOPHEN 5-325 MG PO TABS
1.0000 | ORAL_TABLET | ORAL | Status: DC | PRN
  Administered 2015-10-24: 2 via ORAL
  Administered 2015-10-25 (×2): 1 via ORAL
  Administered 2015-10-26: 2 via ORAL
  Administered 2015-10-26 (×2): 1 via ORAL
  Administered 2015-10-27: 2 via ORAL
  Filled 2015-10-24 (×3): qty 2
  Filled 2015-10-24 (×4): qty 1

## 2015-10-24 MED ORDER — HEPARIN (PORCINE) IN NACL 100-0.45 UNIT/ML-% IJ SOLN
1900.0000 [IU]/h | INTRAMUSCULAR | Status: DC
  Administered 2015-10-24: 1100 [IU]/h via INTRAVENOUS
  Administered 2015-10-25: 1750 [IU]/h via INTRAVENOUS
  Administered 2015-10-25: 1600 [IU]/h via INTRAVENOUS
  Administered 2015-10-26 – 2015-10-27 (×2): 1900 [IU]/h via INTRAVENOUS
  Filled 2015-10-24 (×8): qty 250

## 2015-10-24 MED ORDER — HYDROCODONE-ACETAMINOPHEN 5-325 MG PO TABS: 1.0000 | ORAL_TABLET | ORAL | Status: DC | PRN

## 2015-10-24 MED ORDER — DOCUSATE SODIUM 100 MG PO CAPS
100.0000 mg | ORAL_CAPSULE | Freq: Two times a day (BID) | ORAL | Status: DC
  Administered 2015-10-24: 100 mg via ORAL

## 2015-10-24 MED ORDER — LACTATED RINGERS IV SOLN
INTRAVENOUS | Status: AC
  Administered 2015-10-24: 04:00:00 via INTRAVENOUS

## 2015-10-24 NOTE — Progress Notes (Signed)
ANTICOAGULATION CONSULT NOTE - Follow Up Consult  Pharmacy Consult for Heparin Indication: pulmonary embolus and DVT  Allergies  Allergen Reactions  . Fish Allergy Anaphylaxis and Other (See Comments)    Diarrhea     Patient Measurements: Height: 6' (182.9 cm) Weight: 142 lb 13.7 oz (64.8 kg) IBW/kg (Calculated) : 77.6 Heparin Dosing Weight:   Vital Signs: Temp: 98.2 F (36.8 C) (11/26 2200) Temp Source: Oral (11/26 2200) BP: 95/51 mmHg (11/26 2201) Pulse Rate: 96 (11/26 2200)  Labs:  Recent Labs  10/22/15 1300 10/23/15 0506 10/24/15 0253 10/24/15 0538 10/24/15 1603 10/24/15 2130  HGB 14.1  --   --  13.2  --   --   HCT 41.0  --   --  37.9*  --   --   PLT 126*  --   --  123*  --   --   APTT  --   --   --  33  --   --   LABPROT  --  15.8* 15.3* 15.9*  --   --   INR  --  1.24 1.20 1.26  --   --   HEPARINUNFRC  --   --   --   --  <0.10* 0.24*  CREATININE  --   --   --  0.52*  --   --     Estimated Creatinine Clearance: 81 mL/min (by C-G formula based on Cr of 0.52).   Medications:  Infusions:  . heparin 1,500 Units/hr (10/24/15 1708)    Assessment: Patient with low heparin level.  No heparin issues per RN.  Goal of Therapy:  Heparin level 0.3-0.7 units/ml Monitor platelets by anticoagulation protocol: Yes   Plan:  Increase heparin to 1600 units/hr Recheck level at 0800  Darlina GuysGrimsley Jr, Jacquenette ShoneJulian Crowford 10/24/2015,10:47 PM

## 2015-10-24 NOTE — Progress Notes (Addendum)
ANTICOAGULATION CONSULT NOTE - Follow Up Consult  Pharmacy Consult for IV heparin Indication: hx PE/DVT  Allergies  Allergen Reactions  . Fish Allergy Anaphylaxis and Other (See Comments)    Diarrhea     Patient Measurements: Height: 6' (182.9 cm) Weight: 142 lb 13.7 oz (64.8 kg) IBW/kg (Calculated) : 77.6  Heparin dosing weight: 65kg  Vital Signs: Temp: 98.8 F (37.1 C) (11/26 0534) Temp Source: Oral (11/26 0534) BP: 119/72 mmHg (11/26 0534) Pulse Rate: 107 (11/26 0534)  Labs:  Recent Labs  10/22/15 1300 10/23/15 0506 10/24/15 0253 10/24/15 0538  HGB 14.1  --   --  13.2  HCT 41.0  --   --  37.9*  PLT 126*  --   --  123*  APTT  --   --   --  33  LABPROT  --  15.8* 15.3* 15.9*  INR  --  1.24 1.20 1.26  CREATININE  --   --   --  0.52*    Estimated Creatinine Clearance: 81 mL/min (by C-G formula based on Cr of 0.52).   Medications:  Coumadin 2mg  daily except 3mg  on Tuesdays  Assessment: Patient's a 69 y.o M, on coumadin PTA for hx PE/DVT, presented to the ED on 11/24 with c/o R groin pain. CT showed R hip fracture. Pharmacy is asked to dose heparin in anticipation of surgery.   Today, 10/24/2015: - INR remains sub-therapeutic despite Coumadin - CBC ok, platelets low but stable - No bleeding documented - No significant drug-drug intxns  Goal of Therapy:  Heparin level 0.3-0.7 units/ml   Plan:  DC Coumadin per pharmacy - please reorder when it can be resumed. Start heparin infusion at 1100units/hr, no bolus. Check heparin level in 6hrs and daily thereafter. Check CBC q24h while on heparin. F/u daily.  Charolotte Ekeom Rondel Episcopo, PharmD, pager [phone]. 10/24/2015,9:23 AM.  Addendum: Heparin level <0.1 units/ml. The nurse confirms there have been no interruptions in the infusion and no apparent infiltration. Will increase heparin to 1500units/hr and recheck in 6 hours.  Charolotte Ekeom Marita Burnsed, PharmD, pager [phone]. 10/24/2015,5:06 PM.

## 2015-10-24 NOTE — Evaluation (Signed)
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 Imaging showed minimally displaced R greater trochanter fx.   Clinical Impression  Pt admitted with above diagnosis. Pt currently with functional limitations due to the deficits listed below (see PT Problem List). +2 assist for sit to stand, pt unable to ambulate due to pain, ST-SNF recommended.  Pt will benefit from skilled PT to increase their independence and safety with mobility to allow discharge to the venue listed below.       Follow Up Recommendations SNF    Equipment Recommendations  Rolling walker with 5" wheels;Wheelchair (measurements PT)    Recommendations for Other Services OT consult     Precautions / Restrictions Precautions Precautions: Fall Restrictions Weight Bearing Restrictions: Yes RLE Weight Bearing: Weight bearing as tolerated Other Position/Activity Restrictions: No Active ABDUCTION      Mobility  Bed Mobility Overal bed mobility: +2 for physical assistance;Needs Assistance Bed Mobility: Supine to Sit     Supine to sit: +2 for physical assistance;Max assist     General bed mobility comments: assist to raise trunk and advance BLEs, increased time, limited by pain, verbal cues for technique  Transfers Overall transfer level: Needs assistance Equipment used: Rolling walker (2 wheeled) Transfers: Sit to/from Stand Sit to Stand: +2 physical assistance;Max assist         General transfer comment: assist to rise, verbal cues hand placment, able to advance RLE but not able to advance LLE in standing with RW so pulled 3 in 1 behind pt, then pulled recliner behind pt; sit to stand  x 2 trials  Ambulation/Gait                Stairs            Wheelchair Mobility    Modified Rankin (Stroke Patients Only)       Balance Overall balance assessment: Needs assistance   Sitting balance-Leahy Scale: Good       Standing balance-Leahy Scale: Poor Standing balance comment: relies on BUE support, pain with WB on RLE                             Pertinent Vitals/Pain Pain Assessment: 0-10 Pain Score: 8  Pain Location: R hip with movement Pain Descriptors / Indicators: Sharp Pain Intervention(s): Limited activity within patient's tolerance;Monitored during session;Patient requesting pain meds-RN notified    Home Living Family/patient expects to be discharged to:: Skilled nursing facility Living Arrangements: Alone Available Help at Discharge: Friend(s);Available PRN/intermittently   Home Access: Ramped entrance     Home Layout: One level        Prior Function Level of Independence: Independent               Hand Dominance        Extremity/Trunk Assessment   Upper Extremity Assessment: Overall WFL for tasks assessed           Lower Extremity Assessment: RLE deficits/detail RLE Deficits / Details: 8/10 pain with movement of RLE    Cervical / Trunk Assessment: Normal  Communication   Communication: No difficulties  Cognition Arousal/Alertness: Awake/alert Behavior During Therapy: WFL for tasks assessed/performed Overall Cognitive Status: Within Functional Limits for  tasks assessed                      General Comments      Exercises        Assessment/Plan    PT Assessment Patient needs continued PT services  PT Diagnosis Difficulty walking;Acute pain;Generalized weakness   PT Problem List Decreased strength;Decreased activity tolerance;Decreased balance;Pain;Decreased knowledge of use of DME;Decreased mobility  PT Treatment Interventions DME instruction;Gait training;Functional mobility  training;Therapeutic activities;Patient/family education;Therapeutic exercise;Balance training   PT Goals (Current goals can be found in the Care Plan section) Acute Rehab PT Goals Patient Stated Goal: to walk PT Goal Formulation: With patient Time For Goal Achievement: 11/07/15 Potential to Achieve Goals: Good    Frequency Min 6X/week   Barriers to discharge Decreased caregiver support      Co-evaluation               End of Session Equipment Utilized During Treatment: Gait belt Activity Tolerance: Patient limited by pain   Nurse Communication: Mobility status         Time: 0922-1001 PT Time Calculation (min) (ACUTE ONLY): 39 min   Charges:   PT Evaluation $Initial PT Evaluation Tier I: 1 Procedure PT Treatments $Therapeutic Activity: 23-37 mins   PT G Codes:        Tamala Ser 10/24/2015, 10:06 AM [phone]

## 2015-10-24 NOTE — H&P (Addendum)
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 He noted that they were doing construction on the road and had cones up. However, he did not see the string attached to the cones and he tripped over the cone. He reports right sided groin pain that felt like he just strained that area. He was unable to walk and was transported to hospital for further evaluation. Upon arrival to the hospital initial x-rays did not show acute  fracture however CT imaging showed a mildly communicated fracture of the right hip. Patient notes that he lives alone. He has no way of getting back home because he came by EMS and has no family that lives nearby.     Review of Systems  Constitutional: Negative for fever and chills.  HENT: Negative for ear discharge and ear pain.   Eyes: Negative for double vision and photophobia.  Respiratory: Positive for cough. Negative for hemoptysis and sputum production.   Cardiovascular: Positive for palpitations. Negative for chest pain.  Gastrointestinal: Negative for nausea and vomiting.  Genitourinary: Negative for dysuria and urgency.  Musculoskeletal: Positive for myalgias and joint pain.  Skin: Negative for itching and rash.  Neurological: Negative for tingling and tremors.  Endo/Heme/Allergies: Negative for environmental allergies and polydipsia.  Psychiatric/Behavioral: Negative for suicidal ideas and memory loss.       Past Medical History  Diagnosis Date  . Medical history non-contributory   . DVT (deep venous thrombosis) (HCC)   . Arthritis   . PE  (pulmonary embolism)   . Chronic anticoagulation      Past Surgical History  Procedure Laterality Date  . Ankle surgery  1979    right ankle      Social History:  reports that he has been smoking Cigarettes.  He has a 43 pack-year smoking history. He has never used smokeless tobacco. He reports that he does not drink alcohol or use illicit drugs.  where does patient live--home  and with whom if at home? Alone Can patient participate in ADLs? Not able to ambulate at this time   Allergies  Allergen Reactions  . Fish Allergy Anaphylaxis and Other (See Comments)    Diarrhea     Family History  Problem Relation Age of Onset  . Stomach cancer Father   . Aneurysm    . Cancer Mother         Prior to Admission medications   Medication Sig Start Date End Date Taking? Authorizing Provider  acetaminophen (TYLENOL) 500 MG tablet Take 1,000 mg by mouth every 6 (six) hours as needed for moderate pain or headache.   Yes Historical Provider, MD  atenolol (TENORMIN) 25 MG tablet Take 0.5 tablets (12.5 mg total) by mouth daily. 08/04/15  Yes Quentin Angstlugbemiga E Jegede, MD  warfarin (COUMADIN) 2 MG tablet Take 1 tablet (2 mg total) by mouth daily. Patient taking differently: Take 2-3 mg by mouth daily. Mon,Wed,Thur,Fri,Sat,Sun take 2mg , Tuesday 3mg s 08/18/15  Yes Quentin Angstlugbemiga E Jegede, MD  nicotine (NICODERM CQ - DOSED IN MG/24 HOURS) 21 mg/24hr patch Place 1 patch (21 mg total) onto the skin daily. 07/09/14   Vassie Lollarlos Madera, MD  traMADol (ULTRAM) 50 MG  tablet Take 1 tablet (50 mg total) by mouth every 6 (six) hours as needed for severe pain. 10/22/15   Gerhard Munch, MD     Physical Exam: Filed Vitals:   10/23/15 1910 10/23/15 1911 10/23/15 1952 10/24/15 0030  BP: 129/95 129/95  127/82  Pulse:  116  100  Temp:   98.7 F (37.1 C)   TempSrc:   Oral   Resp:  21  26  Height:      Weight:      SpO2:  95%  88%     Constitutional: Vital signs reviewed. Patient is a well-developed and  well-nourished in no acute distress and cooperative with exam. Alert and oriented x3.  Head: Normocephalic and atraumatic  Ear: TM normal bilaterally  Mouth: no erythema or exudates, MMM  Eyes: PERRL, EOMI, conjunctivae normal, No scleral icterus.  Neck: Supple, Trachea midline normal ROM, No JVD, mass, thyromegaly, or carotid bruit present.  Cardiovascular:Tachycardic,  S1 normal, S2 normal, no MRG, pulses symmetric and intact bilaterally  Pulmonary/Chest: CTAB, no wheezes, rales, or rhonchi  Abdominal: Soft. Non-tender, non-distended, bowel sounds are normal, no masses, organomegaly, or guarding present.  GU: no CVA tenderness Musculoskeletal: Tenderness to palpation of the right hip with limited range of motion secondary to pain  Ext: no edema and no cyanosis, pulses palpable bilaterally (DP and PT)  Hematology: no cervical, inginal, or axillary adenopathy.  Neurological: A&O x3, Strenght is normal and symmetric bilaterally, cranial nerve II-XII are grossly intact, no focal motor deficit, sensory intact to light touch bilaterally.  Skin: Warm, dry and intact. No rash, cyanosis, or clubbing.  Psychiatric: Normal mood and affect. speech and behavior is normal. Judgment and thought content normal. Cognition and memory are normal.      Data Review   Micro Results No results found for this or any previous visit (from the past 240 hour(s)).  Radiology Reports Dg Pelvis 1-2 Views  10/22/2015  CLINICAL DATA:  Right groin pain for 2 days following fall. EXAM: PELVIS - 1-2 VIEW COMPARISON:  None. FINDINGS: Degenerative changes are seen at each hip joint, mild to moderate in degree on the right and mild in degree on the left, with associated mild joint space narrowings, osseous spurring and probable degenerative subchondral cyst formation. Degenerative changes also seen within the lower lumbar spine, mild to moderate in degree. There is a mild levoscoliosis within the lower lumbar spine. Overall  osseous alignment is otherwise normal. No fracture line or displaced fracture fragment identified. No acute-appearing cortical irregularity or osseous lesion. Vascular atherosclerotic calcifications are seen within the pelvis. Soft tissues about the pelvis and hips are otherwise unremarkable. IMPRESSION: 1. 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 2. No acute findings. Electronically Signed   By: Bary Richard M.D.   On: 10/22/2015 13:20   Ct Hip Right Wo Contrast  10/24/2015  CLINICAL DATA:  Acute onset of right leg pain for 3 days. Initial encounter. EXAM: CT OF THE RIGHT HIP WITHOUT CONTRAST TECHNIQUE: Multidetector CT imaging of the right hip was performed according to the standard protocol. Multiplanar CT image reconstructions were also generated. COMPARISON:  Pelvic radiograph performed 12/21/2014 FINDINGS: There is a mildly comminuted fracture involving the right greater femoral trochanter, with minimal displacement. The right femoral neck appears intact. Fragmentation is more prominent at the posterior aspect of the right greater femoral trochanter. The right femoral head remains seated at the acetabulum, with minimal  underlying degenerative change. The pubic symphysis is grossly unremarkable in appearance. Overlying mild soft tissue injury is noted, with hazy soft tissue edema. No well-defined hematoma is seen. Scattered vascular calcifications are noted. IMPRESSION: 1. Mildly comminuted fracture involving the right greater femoral trochanter, with minimal displacement. Right femoral neck appears intact. Fragmentation is more prominent at the posterior aspect of the right greater femoral trochanter. 2. Overlying mild soft tissue injury, with hazy soft tissue edema. No well-defined hematoma seen. 3. Scattered vascular calcifications seen. Electronically Signed   By: Roanna Raider M.D.   On: 10/24/2015 01:37      CBC  Recent Labs Lab 10/22/15 1300  WBC 11.0*  HGB 14.1  HCT 41.0  PLT 126*  MCV 120.2*  MCH 41.3*  MCHC 34.4  RDW 14.7    Chemistries  No results for input(s): NA, K, CL, CO2, GLUCOSE, BUN, CREATININE, CALCIUM, MG, AST, ALT, ALKPHOS, BILITOT in the last 168 hours.  Invalid input(s): GFRCGP ------------------------------------------------------------------------------------------------------------------ CrCl cannot be calculated (Patient has no serum creatinine result on file.). ------------------------------------------------------------------------------------------------------------------ No results for input(s): HGBA1C in the last 72 hours. ------------------------------------------------------------------------------------------------------------------ No results for input(s): CHOL, HDL, LDLCALC, TRIG, CHOLHDL, LDLDIRECT in the last 72 hours. ------------------------------------------------------------------------------------------------------------------ No results for input(s): TSH, T4TOTAL, T3FREE, THYROIDAB in the last 72 hours.  Invalid input(s): FREET3 ------------------------------------------------------------------------------------------------------------------ No results for input(s): VITAMINB12, FOLATE, FERRITIN, TIBC, IRON, RETICCTPCT in the last 72 hours.  Coagulation profile  Recent Labs Lab 10/20/15 1132 10/22/15 1255 10/23/15 0506  INR 2.0 1.42 1.24    No results for input(s): DDIMER in the last 72 hours.  Cardiac Enzymes No results for input(s): CKMB, TROPONINI, MYOGLOBIN in the last 168 hours.  Invalid input(s): CK ------------------------------------------------------------------------------------------------------------------ Invalid input(s): POCBNP   CBG: No results for input(s): GLUCAP in the last 168 hours.     EKG: Independently reviewed. Sinus tachycardia with right bundle branch block and signs of prolonged QTC noted to be  524. Assessment/Plan Principal Problem:  Right Hip fracture (HCC): Acute. Patient reports history of accidental fall. X-ray of hip negative, but CT of the right hip shows a mildly communicated fracture of right greater trochanter, with minimal displacement. Orthopedics surgery consulted in the a.m. Reviewing patient's previous history it appears he would be Intermittent risk for any surgical procedure -Nothing by mouth after midnight -Orthopedics consult in a.m. 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 Initial INR was noted to be 1.24 and subtherapeutic patient received Coumadin today. -Heparin per pharmacy until surgical procedure  Hypertension: Stable - continue atenolol  Sinus tachycardia: Per patient this is chronic. Heart rates in the 100-110's. EKG shows sinus tachycardia with signs of right bundle branch block. -Continue atenolol  Prolonged QTC: Initial QTC on EKG noted to be 524. -Avoid medication for long QTc  Leukocytosis: WBC initially 11. Suspect secondary  to acute stress versus atelectasis -CBC in a.m.  Tobacco abuse: Chronic. -offered patient nicotine patch -Will need counseling on smoking cessation    Code Status:   full Family Communication: bedside Disposition Plan: admit   Total time spent 55 minutes.Greater than 50% of this time was spent in counseling, explanation of diagnosis, planning of further management, and coordination of care  Clydie Braun [facility] Pager [phone]  If 7PM-7AM, please contact night-coverage [email or web address] Password [email or web address] 10/24/2015, 3:01 AM

## 2015-10-24 NOTE — Progress Notes (Signed)
Patient ID: Jeremy Herman, male   DOB: [birth year], [age] y.o.   MRN: [id number]    Subjective:     Patient reports pain as 4 on 0-10 scale.   Denies CP or SOB.  Voiding without difficulty. Positive flatus.  Right greater troch fracture  Objective: Vital signs in last 24 hours: Temp:  [98.1 F (36.7 C)-99.1 F (37.3 C)] 98.8 F (37.1 C) (11/26 0534) Pulse Rate:  [96-116] 107 (11/26 0534) Resp:  [14-26] 15 (11/26 0534) BP: (103-147)/(69-98) 119/72 mmHg (11/26 0534) SpO2:  [88 %-96 %] 90 % (11/26 0534) Weight:  [64.8 kg (142 lb 13.7 oz)] 64.8 kg (142 lb 13.7 oz) (11/26 0411)  Intake/Output from previous day: 11/25 0701 - 11/26 0700 In: 245.8 [I.V.:245.8] Out: -  Intake/Output this shift:    Labs:  Recent Labs  10/22/15 1300 10/24/15 0538  HGB 14.1 13.2    Recent Labs  10/22/15 1300 10/24/15 0538  WBC 11.0* 12.5*  RBC 3.41* 3.18*  HCT 41.0 37.9*  PLT 126* 123*    Recent Labs  10/24/15 0538  NA 132*  K 3.5  CL 100*  CO2 25  BUN 12  CREATININE 0.52*  GLUCOSE 108*  CALCIUM 8.1*    Recent Labs  10/24/15 0253 10/24/15 0538  INR 1.20 1.26    Physical Exam: Neurologically intact ABD soft Sensation intact distally Intact pulses distally Dorsiflexion/Plantar flexion intact Compartment soft Pain in the right groin Assessment/Plan:     Advance diet Up with therapy  Pt is non weight bearing as tolerated with walker not active abduction Consulted with Dr. Laurey MoraleSwinteck   Jeremy Herman, Baxter Kailarmen Christina for Dr. Venita Lickahari Brooks [facility] [phone] 10/24/2015, 8:37 AM

## 2015-10-24 NOTE — Care Management Note (Signed)
Case Management Note  Patient Details  Name: Magda PaganiniJames Borntreger MRN: [id number] Date of Birth: 11-30-1945  Subjective/Objective:                  Hip fracture  Action/Plan: CM spoke with patient at the bedside. States he is not sure what the plan will be now. Reports he had a CT scan on yesterday which revealed a right hip fracture. He is waiting to see the surgeon to determine the plan of care. He is not sure whether or not he will need surgery or home care at this time. He lives at home alone. Will continue to follow for discharge needs.   Expected Discharge Date:  10/28/15               Expected Discharge Plan:     In-House Referral:     Discharge planning Services  CM Consult  Post Acute Care Choice:    Choice offered to:     DME Arranged:    DME Agency:     HH Arranged:    HH Agency:     Status of Service:  In process, will continue to follow  Medicare Important Message Given:    Date Medicare IM Given:    Medicare IM give by:    Date Additional Medicare IM Given:    Additional Medicare Important Message give by:     If discussed at Long Length of Stay Meetings, dates discussed:    Additional Comments:  Antony HasteBennett, Miryam Mcelhinney Harris, RN 10/24/2015, 9:05 AM

## 2015-10-24 NOTE — Progress Notes (Signed)
Pt seen and examined at bedside, admitted after midnight, please see earlier admission note by Dr. Katrinka BlazingSmith. Pt reports pain 8/10 at this time while sitting in chair, requiring two people assistance for ambulation. Will likely need SNF upon discharge. Will consult SW for placement.   Debbora PrestoMAGICK-Kenderick Kobler, MD  [facility] Pager [phone]  If 7PM-7AM, please contact night-coverage [email or web address] Password [email or web address]

## 2015-10-25 LAB — HEPARIN LEVEL (UNFRACTIONATED)
Heparin Unfractionated: 0.32 IU/mL (ref 0.30–0.70)
Heparin Unfractionated: 0.23 IU/mL — ABNORMAL LOW (ref 0.30–0.70)

## 2015-10-25 LAB — BASIC METABOLIC PANEL
Anion gap: 9 (ref 5–15)
BUN: 19 mg/dL (ref 6–20)
CHLORIDE: 99 mmol/L — AB (ref 101–111)
CO2: 22 mmol/L (ref 22–32)
CREATININE: 0.64 mg/dL (ref 0.61–1.24)
Calcium: 7.8 mg/dL — ABNORMAL LOW (ref 8.9–10.3)
GFR calc Af Amer: >60 mL/min (ref >60)
GFR calc non Af Amer: >60 mL/min (ref >60)
GLUCOSE: 122 mg/dL — AB (ref 65–99)
POTASSIUM: 3.1 mmol/L — AB (ref 3.5–5.1)
Sodium: 130 mmol/L — ABNORMAL LOW (ref 135–145)

## 2015-10-25 LAB — TYPE AND SCREEN
ABO/RH(D): O POS
ANTIBODY SCREEN: POSITIVE
DAT, IGG: NEGATIVE

## 2015-10-25 LAB — CBC
HEMATOCRIT: 37 % — AB (ref 39.0–52.0)
HEMOGLOBIN: 12.7 g/dL — AB (ref 13.0–17.0)
MCH: 41 pg — AB (ref 26.0–34.0)
MCHC: 34.3 g/dL (ref 30.0–36.0)
MCV: 119.4 fL — AB (ref 78.0–100.0)
Platelets: 121 10*3/uL — ABNORMAL LOW (ref 150–400)
RBC: 3.1 MIL/uL — AB (ref 4.22–5.81)
RDW: 14.1 % (ref 11.5–15.5)
WBC: 10 10*3/uL (ref 4.0–10.5)

## 2015-10-25 LAB — PROTIME-INR
INR: 1.31 (ref 0.00–1.49)
Prothrombin Time: 16.4 seconds — ABNORMAL HIGH (ref 11.6–15.2)

## 2015-10-25 MED ORDER — WARFARIN - PHARMACIST DOSING INPATIENT: Freq: Every day | Status: DC

## 2015-10-25 MED ORDER — POTASSIUM CHLORIDE CRYS ER 20 MEQ PO TBCR
40.0000 meq | EXTENDED_RELEASE_TABLET | Freq: Once | ORAL | Status: AC
  Administered 2015-10-25: 40 meq via ORAL
  Filled 2015-10-25: qty 2

## 2015-10-25 MED ORDER — WARFARIN SODIUM 5 MG PO TABS
5.0000 mg | ORAL_TABLET | Freq: Once | ORAL | Status: AC
  Administered 2015-10-25: 5 mg via ORAL
  Filled 2015-10-25: qty 1

## 2015-10-25 NOTE — Progress Notes (Signed)
Pt states he only voids once per day as that is normal for him.denies any distention or discomfort.Jeremy HeadlandBeverly, Jeremy Herman D

## 2015-10-25 NOTE — Clinical Social Work Note (Signed)
CSW attempted to meet with pt but he was sleeping.  CSW called and left message for pt's spouse with phone number to call back regarding discharge plans.  Elray Buba.Traniyah Hallett, LCSW [facility] Clinical Social Worker - Weekend Coverage cell #: [phone]

## 2015-10-25 NOTE — Progress Notes (Signed)
Patient ID: Jeremy Herman, male   DOB: 06/30/1946, 69 y.o.   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 Upon arrival to the hospital initial x-rays did not show acute fracture however CT imaging showed a mildly communicated fracture of the right hip.   Assessment/Plan:    Principal Problem:   Hip fracture (HCC) - Mildly comminuted fracture involving the right greater femoral trochanter, with minimal displacement - non operative management  - continue analgesia as needed - PT following - SNF in AM  Active Problems:   Leukocytosis - likely reactive from fall  - resolved     History of pulmonary embolus (PE) and History of DVT (deep vein thrombosis) - on Coumadin, appreciate pharmacy assistance     Hypertension, essential with asymptomatic tachycardia  - SBP in 100's - continue Atenolol    Hip fracture, right (HCC)    Thrombocytopenia - no signs of bleeding - CBC In AM    Hypokalemia - supplement and repeat BMP in AM    Hyponatremia  - slowly improving - BMP In AM  DVT prophylaxis - pt on Coumadin   Code Status: Full.  Family Communication:  plan of care discussed with the patient Disposition Plan: SNF 11/28   IV access:  Peripheral IV  Procedures and diagnostic studies:    Dg Pelvis 1-2 Views 10/22/2015 Degenerative changes at the bilateral hips, right greater than left, and within the lower lumbar spine, as detailed above. 2. No acute findings.  Ct Hip Right Wo Contrast 10/24/2015  Mildly comminuted fracture involving the right greater femoral trochanter, with minimal displacement. Right femoral neck appears intact. Fragmentation is more prominent at the posterior  aspect of the right greater femoral trochanter. 2. Overlying mild soft tissue injury, with hazy soft tissue edema. No well-defined hematoma seen. 3. Scattered vascular calcifications seen.   Dg Chest Port 1 View 10/24/2015  Mild bilateral midlung scarring noted.  Lungs otherwise clear   Medical Consultants:  Ortho   Other Consultants:  PT  IAnti-Infectives:   None  Debbora Presto, MD  [facility] Pager [phone]  If 7PM-7AM, please contact night-coverage [email or web address] Password [email or web address] 10/25/2015, 2:35 PM   LOS: 1 day   HPI/Subjective: No events overnight.   Objective: Filed Vitals:   10/24/15 2201 10/25/15 0300 10/25/15 0600 10/25/15 1420  BP: 95/51 113/68 114/62 106/55  Pulse:  92 86 84  Temp:   98.2 F (36.8 C) 98.7 F (37.1 C)  TempSrc:   Oral Oral  Resp:   14 14  Height:      Weight:      SpO2: 96%  95% 95%    Intake/Output Summary (Last 24 hours) at 10/25/15 1435 Last data filed at 10/25/15 1421  Gross per 24 hour  Intake 911.68 ml  Output      0 ml  Net 911.68 ml    Exam:   General:  Pt is alert, follows commands appropriately, not in acute distress  Cardiovascular: Regular rate and rhythm, no rubs, no gallops  Respiratory: Clear to auscultation bilaterally, no wheezing, no crackles, no rhonchi  Abdomen: Soft, non tender, non distended, bowel sounds present, no guarding  Extremities: No edema, pulses DP and PT palpable bilaterally  Neuro: Grossly nonfocal  Data Reviewed: Basic Metabolic  Panel:  Recent Labs Lab 10/24/15 0538 10/25/15 0444  NA 132* 130*  K 3.5 3.1*  CL 100* 99*  CO2 25 22  GLUCOSE 108* 122*  BUN 12 19  CREATININE 0.52* 0.64  CALCIUM 8.1* 7.8*   CBC:  Recent Labs Lab 10/22/15 1300 10/24/15 0538 10/25/15 0444  WBC 11.0* 12.5* 10.0  HGB 14.1 13.2 12.7*  HCT 41.0 37.9* 37.0*  MCV 120.2* 119.2* 119.4*  PLT 126* 123* 121*   Scheduled Meds: . atenolol  12.5 mg Oral Daily  . docusate sodium  100 mg Oral BID  .  Warfarin - Pharmacist Dosing Inpatient   Does not apply q1800   Continuous Infusions: . heparin 1,600 Units/hr (10/25/15 0456)

## 2015-10-25 NOTE — Progress Notes (Addendum)
ANTICOAGULATION CONSULT NOTE - Follow Up  Pharmacy Consult for IV heparin Indication: hx PE/DVT  Allergies  Allergen Reactions  . Fish Allergy Anaphylaxis and Other (See Comments)    Diarrhea     Patient Measurements: Height: 6' (182.9 cm) Weight: 142 lb 13.7 oz (64.8 kg) IBW/kg (Calculated) : 77.6  Heparin dosing weight: 65kg  Vital Signs: Temp: 98.7 F (37.1 C) (11/27 1420) Temp Source: Oral (11/27 1420) BP: 106/55 mmHg (11/27 1420) Pulse Rate: 84 (11/27 1420)  Labs:  Recent Labs  10/24/15 0253 10/24/15 0538  10/24/15 2130 10/25/15 0444 10/25/15 0818 10/25/15 1406  HGB  --  13.2  --   --  12.7*  --   --   HCT  --  37.9*  --   --  37.0*  --   --   PLT  --  123*  --   --  121*  --   --   APTT  --  33  --   --   --   --   --   LABPROT 15.3* 15.9*  --   --  16.4*  --   --   INR 1.20 1.26  --   --  1.31  --   --   HEPARINUNFRC  --   --   < > 0.24*  --  0.32 0.23*  CREATININE  --  0.52*  --   --  0.64  --   --   < > = values in this interval not displayed.  Estimated Creatinine Clearance: 81 mL/min (by C-G formula based on Cr of 0.64).   Medications:  Coumadin 2mg  daily except 3mg  on Tuesdays  Assessment: Patient's a 69 y.o M, on coumadin PTA for hx PE/DVT, presented to the ED on 11/24 with c/o R groin pain. CT showed R hip fracture. Pharmacy is asked to dose heparin in anticipation of surgery.   Today, 10/25/2015: - Heparin level in therapeutic range - CBC ok, platelets low but stable - No bleeding documented - No significant drug-drug intxns  Goal of Therapy:  Heparin level 0.3-0.7 units/ml   Plan:  Cont heparin infusion at 1600units/hr. Recheck heparin level in 6hrs.  Check CBC q24h while on heparin. F/u daily. Ortho mentions plan is non-surgical management. F/u with [facility] for conversion back to Coumadin.  Charolotte Ekeom Mahli Glahn, PharmD, pager [phone]. 10/25/2015,2:38 PM.  Addendum: Spoke with Dr. Izola PriceMyers - resume Coumadin.  Give Coumadin 5mg  this  am. Will plan to stop heparin when INR is therapeutic.  Charolotte Ekeom Tianne Plott, PharmD, pager [phone]. 10/25/2015,2:38 PM.  Addendum: Confirmatory heparin level is slightly subtherapeutic. DVT/PE was >6 months ago. Will increase heparin conservatively to 1750units/hr and recheck heparin level with AM labs to reduce number of sticks.  Charolotte Ekeom Perrin Eddleman, PharmD, pager [phone]. 10/25/2015,2:42 PM.

## 2015-10-25 NOTE — Progress Notes (Signed)
Physical Therapy Treatment Patient Details Name: Izaac Reisig MRN: [id number] DOB: Oct 02, 1946 Today's Date: 10/25/2015    History of Present Illness Patient is a 69 year old male with a past medical history significant for multiple DVT/PE's on chronic anticoagulation therapy, hypertension, tachycardia, tobacco abuse; who presents after sustaining a fall unable to ambulate. Imaging showed minimally displaced R greater trochanter fx.     PT Comments    Extra time for bed mobility and taking small steps forward. Recommend CIR consult.   Follow Up Recommendations  CIR     Equipment Recommendations  Rolling walker with 5" wheels;Wheelchair (measurements PT)    Recommendations for Other Services Rehab consult;OT consult     Precautions / Restrictions Precautions Precautions: Fall Restrictions RLE Weight Bearing: Weight bearing as tolerated Other Position/Activity Restrictions: No Active ABDUCTION    Mobility  Bed Mobility Overal bed mobility: +2 for physical assistance;Needs Assistance Bed Mobility: Supine to Sit;Sit to Supine     Supine to sit: +2 for physical assistance;Max assist;+2 for safety/equipment Sit to supine: Max assist;+2 for physical assistance;+2 for safety/equipment   General bed mobility comments: extra time for moving the legs but able to  move them to the edge, required assist  to lower the leg  to the floor. Assist to place both legs onto the bed.  Transfers Overall transfer level: Needs assistance Equipment used: Rolling walker (2 wheeled) Transfers: Sit to/from Stand Sit to Stand: +2 physical assistance;Max assist         General transfer comment: assist to rise, verbal cues hand placment, very little stepping forward, about a half step, then steps back to recliner(not more than 1 foot from the bed.  Ambulation/Gait                 Stairs            Wheelchair Mobility    Modified Rankin (Stroke Patients Only)       Balance                                     Cognition Arousal/Alertness: Awake/alert Behavior During Therapy: WFL for tasks assessed/performed                        Exercises      General Comments        Pertinent Vitals/Pain Pain Assessment: Faces Pain Score: 7  Pain Location: R groin Pain Descriptors / Indicators: Guarding;Grimacing;Sharp Pain Intervention(s): Limited activity within patient's tolerance;Premedicated before session;Repositioned;Ice applied    Home Living                      Prior Function            PT Goals (current goals can now be found in the care plan section) Progress towards PT goals: Progressing toward goals    Frequency  Min 6X/week    PT Plan Current plan remains appropriate;Discharge plan needs to be updated    Co-evaluation             End of Session Equipment Utilized During Treatment: Gait belt Activity Tolerance: Patient limited by pain Patient left: in bed;with call bell/phone within reach;with bed alarm set     Time: 4098-1191 PT Time Calculation (min) (ACUTE ONLY): 24 min  Charges:  $Gait Training: 23-37 mins  G Codes:      Rada HayHill, Arletta Lumadue Elizabeth 10/25/2015, 5:08 PM  Blanchard KelchKaren Aveah Castell PT [phone]

## 2015-10-25 NOTE — Progress Notes (Signed)
Rehab Admissions Coordinator Note:  Patient was screened by Clois DupesBoyette, Dontay Harm Godwin for appropriateness for an Inpatient Acute Rehab Consult per PT recommendation.  At this time, we are recommending an inpt rehab consult . Please place order if pt would like to pursue.  Clois DupesBoyette, Nikoleta Dady Godwin 10/25/2015, 8:57 PM  I can be reached at [phone].

## 2015-10-25 NOTE — Progress Notes (Signed)
Subjective: Patient is seen this am. Reports mild right hip pain. No new complications. Tolertaing PO's. Progressing with PT. Denis CP, Calf pain, or SOB.   Objective: Vital signs in last 24 hours: Temp:  [98.2 F (36.8 C)-98.8 F (37.1 C)] 98.2 F (36.8 C) (11/27 0600) Pulse Rate:  [86-129] 86 (11/27 0600) Resp:  [14-15] 14 (11/27 0600) BP: (88-119)/(51-80) 114/62 mmHg (11/27 0600) SpO2:  [92 %-100 %] 95 % (11/27 0600)  Intake/Output from previous day: 11/26 0701 - 11/27 0700 In: 1128.8 [P.O.:480; I.V.:648.8] Out: -  Intake/Output this shift:     Recent Labs  10/22/15 1300 10/24/15 0538 10/25/15 0444  HGB 14.1 13.2 12.7*    Recent Labs  10/24/15 0538 10/25/15 0444  WBC 12.5* 10.0  RBC 3.18* 3.10*  HCT 37.9* 37.0*  PLT 123* 121*    Recent Labs  10/24/15 0538 10/25/15 0444  NA 132* 130*  K 3.5 3.1*  CL 100* 99*  CO2 25 22  BUN 12 19  CREATININE 0.52* 0.64  GLUCOSE 108* 122*  CALCIUM 8.1* 7.8*    Recent Labs  10/24/15 0538 10/25/15 0444  INR 1.26 1.31    Well nourished. Alert and oriented x3. RRR, Lungs clear, BS x4. Abdomen soft and non tender. Right Calf soft and non tender. Right hip is tender to touch. No DVT signs. Compartment soft. No signs of infection.  Right LE grossly neurovascular intact.  Assessment/Plan: Right hip greater trochanteric impaction fx: Plan is non-surgical Up with PT WBAT with no ABDuction D/c to SNF when ready F/u with Dr. Linna CapriceSwinteck in the office in 2 weeks from D/c Call for any more recommedations    Yuta Cipollone L 10/25/2015, 8:39 AM

## 2015-10-26 DIAGNOSIS — Z7901 Long term (current) use of anticoagulants: Secondary | ICD-10-CM

## 2015-10-26 DIAGNOSIS — Z86711 Personal history of pulmonary embolism: Secondary | ICD-10-CM

## 2015-10-26 DIAGNOSIS — D696 Thrombocytopenia, unspecified: Secondary | ICD-10-CM

## 2015-10-26 DIAGNOSIS — E876 Hypokalemia: Secondary | ICD-10-CM | POA: Insufficient documentation

## 2015-10-26 DIAGNOSIS — Z720 Tobacco use: Secondary | ICD-10-CM

## 2015-10-26 DIAGNOSIS — S72001A Fracture of unspecified part of neck of right femur, initial encounter for closed fracture: Secondary | ICD-10-CM

## 2015-10-26 DIAGNOSIS — Z86718 Personal history of other venous thrombosis and embolism: Secondary | ICD-10-CM

## 2015-10-26 DIAGNOSIS — M25551 Pain in right hip: Secondary | ICD-10-CM

## 2015-10-26 DIAGNOSIS — I10 Essential (primary) hypertension: Secondary | ICD-10-CM

## 2015-10-26 DIAGNOSIS — M25552 Pain in left hip: Secondary | ICD-10-CM | POA: Insufficient documentation

## 2015-10-26 DIAGNOSIS — D531 Other megaloblastic anemias, not elsewhere classified: Secondary | ICD-10-CM | POA: Insufficient documentation

## 2015-10-26 LAB — CBC
HEMATOCRIT: 34.3 % — AB (ref 39.0–52.0)
Hemoglobin: 11.8 g/dL — ABNORMAL LOW (ref 13.0–17.0)
MCH: 41.1 pg — ABNORMAL HIGH (ref 26.0–34.0)
MCHC: 34.4 g/dL (ref 30.0–36.0)
MCV: 119.5 fL — AB (ref 78.0–100.0)
Platelets: 131 10*3/uL — ABNORMAL LOW (ref 150–400)
RBC: 2.87 MIL/uL — AB (ref 4.22–5.81)
RDW: 14.1 % (ref 11.5–15.5)
WBC: 7.8 10*3/uL (ref 4.0–10.5)

## 2015-10-26 LAB — PROTIME-INR
INR: 1.22 (ref 0.00–1.49)
Prothrombin Time: 15.6 seconds — ABNORMAL HIGH (ref 11.6–15.2)

## 2015-10-26 LAB — HEPARIN LEVEL (UNFRACTIONATED)
HEPARIN UNFRACTIONATED: 0.65 [IU]/mL (ref 0.30–0.70)
Heparin Unfractionated: 0.26 IU/mL — ABNORMAL LOW (ref 0.30–0.70)
Heparin Unfractionated: 0.54 [IU]/mL (ref 0.30–0.70)

## 2015-10-26 LAB — BASIC METABOLIC PANEL WITH GFR
Anion gap: 7 (ref 5–15)
BUN: 16 mg/dL (ref 6–20)
CO2: 24 mmol/L (ref 22–32)
Calcium: 8 mg/dL — ABNORMAL LOW (ref 8.9–10.3)
Chloride: 105 mmol/L (ref 101–111)
Creatinine, Ser: 0.59 mg/dL — ABNORMAL LOW (ref 0.61–1.24)
GFR calc Af Amer: >60 mL/min
GFR calc non Af Amer: >60 mL/min
Glucose, Bld: 117 mg/dL — ABNORMAL HIGH (ref 65–99)
Potassium: 3.2 mmol/L — ABNORMAL LOW (ref 3.5–5.1)
Sodium: 136 mmol/L (ref 135–145)

## 2015-10-26 MED ORDER — WARFARIN SODIUM 5 MG PO TABS
5.0000 mg | ORAL_TABLET | Freq: Once | ORAL | Status: AC
  Administered 2015-10-26: 5 mg via ORAL
  Filled 2015-10-26: qty 1

## 2015-10-26 MED ORDER — POTASSIUM CHLORIDE CRYS ER 20 MEQ PO TBCR
40.0000 meq | EXTENDED_RELEASE_TABLET | Freq: Once | ORAL | Status: AC
  Administered 2015-10-26: 40 meq via ORAL
  Filled 2015-10-26: qty 2

## 2015-10-26 MED ORDER — GI COCKTAIL ~~LOC~~
30.0000 mL | Freq: Three times a day (TID) | ORAL | Status: DC | PRN
  Administered 2015-10-26: 30 mL via ORAL
  Filled 2015-10-26 (×2): qty 30

## 2015-10-26 NOTE — Care Management Note (Signed)
Case Management Note  Patient Details  Name: Magda PaganiniJames Ringer MRN: [id number] Date of Birth: November 25, 1946  Subjective/Objective:     Admitted s/p fall with minimally displaced R greater trochanter fx, pmh complex PE/DVT               Action/Plan: Discharge planning per CSW  Expected Discharge Date:  10/28/15               Expected Discharge Plan:  Skilled Nursing Facility  In-House Referral:  Clinical Social Work  Discharge planning Services  CM Consult  Post Acute Care Choice:  NA Choice offered to:  NA  DME Arranged:  N/A DME Agency:  NA  HH Arranged:  NA HH Agency:  NA  Status of Service:  Completed, signed off  Medicare Important Message Given:    Date Medicare IM Given:    Medicare IM give by:    Date Additional Medicare IM Given:    Additional Medicare Important Message give by:     If discussed at Long Length of Stay Meetings, dates discussed:    Additional Comments:  Alexis Goodelleele, Nickalaus Crooke K, RN 10/26/2015, 12:24 PM

## 2015-10-26 NOTE — Progress Notes (Signed)
Rehab admissions - Patient lacks the medical necessity to support an acute inpatient rehab admission.  Please see consult done by rehab MD Allena KatzPatel today.  Recommend pursuit of SNF placement.  #[phone]

## 2015-10-26 NOTE — Progress Notes (Signed)
Patient ID: Shaymus Eveleth, male   DOB: 01-10-1946, 69 y.o.   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 Upon arrival to the hospital initial x-rays did not show acute fracture however CT imaging showed a mildly communicated fracture of the right hip.   Assessment/Plan:    Principal Problem:   Hip fracture (HCC), right, acute  - Mildly comminuted fracture involving the right greater femoral trochanter, with minimal displacement - non operative management  - continue analgesia as needed - PT following, still requiring two people assistance with ambulation  - IR MD consulted for consideration for inpatient rehab   Active Problems:   Leukocytosis - likely reactive from fall  - resolved     History of pulmonary embolus (PE) and History of DVT (deep vein thrombosis) - pt presented with acute dyspnea back in 2013 when he was first diagnosed with PE and RLE DVT - he was started on lovenox and xarelto upon discharge at that time only to come back with progression of the blood clot to LLE - since that time pt has been on Coumadin and if bridging required, Heparin has been used  - pt is still on heparin now and INR is sub therapeutic, with his mobility issues currently he is still at high risk of DVT/PE so needs to have INR closely monitored     Hypertension, essential with asymptomatic tachycardia  - SBP in 110's - continue Atenolol     Thrombocytopenia - no signs of bleeding but slightly down  - CBC In AM    Hypokalemia - continue to supplement and repeat BMP in AM    Hyponatremia  - resolved  - BMP In AM  DVT prophylaxis - pt on Coumadin   Code Status: Full.  Family Communication:   plan of care discussed with the patient Disposition Plan: SNF vs IR when INR stable and when bed available   IV access:  Peripheral IV  Procedures and diagnostic studies:    Dg Pelvis 1-2 Views 10/22/2015 Degenerative changes at the bilateral hips, right greater than left, and within the lower lumbar spine, as detailed above. 2. No acute findings.  Ct Hip Right Wo Contrast 10/24/2015  Mildly comminuted fracture involving the right greater femoral trochanter, with minimal displacement. Right femoral neck appears intact. Fragmentation is more prominent at the posterior aspect of the right greater femoral trochanter. 2. Overlying mild soft tissue injury, with hazy soft tissue edema. No well-defined hematoma seen. 3. Scattered vascular calcifications seen.   Dg Chest Port 1 View 10/24/2015  Mild bilateral midlung scarring noted.  Lungs otherwise clear   Medical Consultants:  Ortho   Other Consultants:  PT  IAnti-Infectives:   None  Debbora Presto, MD  [facility] Pager [phone]  If 7PM-7AM, please contact night-coverage [email or web address] Password [facility] 10/26/2015, 1:34 PM   LOS: 2 days   HPI/Subjective: No events overnight.   Objective: Filed Vitals:   10/25/15 1420 10/25/15 1900 10/26/15 0621 10/26/15 0900  BP: 106/55 123/72 132/73 116/67  Pulse: 84 116 88 86  Temp: 98.7 F (37.1 C) 97.7 F (36.5 C) 98.7 F (37.1 C)   TempSrc: Oral Oral Oral   Resp: 14  16   Height:      Weight:  SpO2: 95% 95% 97%     Intake/Output Summary (Last 24 hours) at 10/26/15 1334 Last data filed at 10/26/15 0954  Gross per 24 hour  Intake 1725.89 ml  Output    525 ml  Net 1200.89 ml    Exam:   General:  Pt is alert, follows commands appropriately, not in acute distress  Cardiovascular: Regular rate and rhythm, no rubs, no gallops  Respiratory: Clear to auscultation bilaterally, no wheezing, no crackles, no rhonchi  Abdomen: Soft, non tender, non distended, bowel sounds  present, no guarding  Extremities: No edema, pulses DP and PT palpable bilaterally, TTP at the right hip area   Neuro: Grossly nonfocal  Data Reviewed: Basic Metabolic Panel:  Recent Labs Lab 10/24/15 0538 10/25/15 0444 10/26/15 0434  NA 132* 130* 136  K 3.5 3.1* 3.2*  CL 100* 99* 105  CO2 25 22 24   GLUCOSE 108* 122* 117*  BUN 12 19 16   CREATININE 0.52* 0.64 0.59*  CALCIUM 8.1* 7.8* 8.0*   CBC:  Recent Labs Lab 10/22/15 1300 10/24/15 0538 10/25/15 0444 10/26/15 0434  WBC 11.0* 12.5* 10.0 7.8  HGB 14.1 13.2 12.7* 11.8*  HCT 41.0 37.9* 37.0* 34.3*  MCV 120.2* 119.2* 119.4* 119.5*  PLT 126* 123* 121* 131*   Scheduled Meds: . atenolol  12.5 mg Oral Daily  . docusate sodium  100 mg Oral BID  . Warfarin - Pharmacist Dosing Inpatient   Does not apply q1800   Continuous Infusions: . heparin 1,900 Units/hr (10/26/15 1153)

## 2015-10-26 NOTE — Clinical Social Work Note (Signed)
Clinical Social Work Assessment  Patient Details  Name: Jeremy Herman MRN: [id number] Date of Birth: 1946-01-12  Date of referral:  10/26/15               Reason for consult:  Discharge Planning                Permission sought to share information with:  Ocean[employer]ility Contact Representative Permission granted to share information::  Yes, Verbal Permission Granted  Name::        Agency::  [place]   Relationship::     Contact Information:  None given: Emergency contact is Pts wife and the are separeted   Housing/Transportation Living arrangements for the past 2 months:  Apartment Source of Information:  Patient Patient Interpreter Needed:  None Criminal Activity/Legal Involvement Pertinent to Current Situation/Hospitalization:  No - Comment as needed Significant Relationships:  Friend Lives with:  Self Do you feel safe going back to the place where you live?  No Need for family participation in patient care:  No (Coment)  Care giving concerns: Pt has no support system at home.   Social Worker assessment / plan: CSW spoke with the Pt at the bedside. CSW introduced self and reason for visit. Pt stated he has never received PT services or been to a SNF facility. Pt is aware that he will need services at a SNF facility. Pt stated that currently Pt lives alone and is currently separated from his wife. Pt stated that he has a few friends but none that would be able to take care of him. Pt stated that he is in agreement for CSW to [place] at time of d/c. CSW will also send Pt information to a larger search area for better coverage for d/c planning needs.   Employment status:    Insurance information:  Medicare PT Recommendations:  Skilled Nursing Facility Information / Referral to community resources:  Skilled Nursing Facility  Patient/Family's Response to care: Pt is appreciative for assistance with d/c planning and agreeable  to plan.  Patient/Family's Understanding of and Emotional Response to Diagnosis, Current Treatment, and Prognosis: Pt is aware of the rehab needs and has an understanding of current diagnosis.   Emotional Assessment Appearance:  Appears stated age Attitude/Demeanor/Rapport:    Affect (typically observed):  Accepting, Calm, Stable Orientation:  Oriented to Self, Oriented to Place, Oriented to  Time, Oriented to Situation Alcohol / Substance use:  Never Used Psych involvement (Current and /or in the community):  No (Comment)  Discharge Needs  Concerns to be addressed:  Discharge Planning Concerns Readmission within the last 30 days:  No Current discharge risk:  Lack of support system Barriers to Discharge:  No SNF bed   Jeremy CroakCassandra Lilymae Herman 10/26/2015, 5:36 PM

## 2015-10-26 NOTE — NC FL2 (Signed)
[place] MEDICAID FL2 LEVEL OF CARE SCREENING TOOL     IDENTIFICATION  Patient Name: Jeremy Herman Birthdate: [birth year] Sex: male Admission Date (Current Location): 10/22/2015  [facility] [place]   [facility] Address[facility]  [facility],  [address], [place] [id number]      Provider Number: [id number]  Attending Physician Name and Address:  Dorothea OgleIskra M Myers, MD  Relative Name and Phone Number:       Current Level of Care: Hospital Recommended Level of Care: Skilled Nursing Facility Prior Approval Number:    Date Approved/Denied:   PASRR Number:   [id number]  Discharge Plan: SNF    Current Diagnoses: Patient Active Problem List   Diagnosis Date Noted  . Hip fracture (HCC) 10/24/2015  . Tobacco abuse 10/24/2015  . Chronic anticoagulation 10/24/2015  . History of pulmonary embolus (PE) 10/24/2015  . History of DVT (deep vein thrombosis) 10/24/2015  . Hypertension 10/24/2015  . Hip fracture, right (HCC) 10/24/2015  . DVT, lower extremity (HCC)   . Pulmonary emboli (HCC) 07/08/2014  . Smoker 07/08/2014  . 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 Bilateral pulmonary embolism (HCC) 09/17/2013  . DVT, bilateral lower limbs (HCC) 09/17/2013    Orientation ACTIVITIES/SOCIAL BLADDER RESPIRATION    Self, Time, Situation, Place  Active Continent    BEHAVIORAL SYMPTOMS/MOOD NEUROLOGICAL BOWEL NUTRITION STATUS      Continent    PHYSICIAN VISITS COMMUNICATION OF NEEDS Height & Weight Skin    Verbally 6' (182.9 cm) 142 lbs. Normal          AMBULATORY STATUS RESPIRATION    Assist extensive        Personal Care Assistance Level of Assistance  Bathing, Dressing Bathing Assistance: Limited assistance   Dressing Assistance: Limited assistance      Functional Limitations Info  Hearing Sight Info: Adequate Hearing Info: Adequate         SPECIAL CARE FACTORS FREQUENCY  PT (By licensed PT), OT (By licensed OT)     PT Frequency: 5 OT  Frequency: 5           Additional Factors Info  Allergies   Allergies Info: FISH ALLERGY           Current Medications (10/26/2015):  This is the current hospital active medication list Current Facility-Administered Medications  Medication Dose Route Frequency Provider Last Rate Last Dose  . acetaminophen (TYLENOL) tablet 1,000 mg  1,000 mg Oral Q6H PRN Gerhard Munchobert Lockwood, MD   1,000 mg at 10/25/15 2003  . atenolol (TENORMIN) tablet 12.5 mg  12.5 mg Oral Daily Gerhard Munchobert Lockwood, MD   12.5 mg at 10/26/15 0911  . docusate sodium (COLACE) capsule 100 mg  100 mg Oral BID Clydie Braunondell A Smith, MD   100 mg at 10/24/15 1014  . heparin ADULT infusion 100 units/mL (25000 units/250 mL)  1,900 Units/hr Intravenous Continuous Dorothea OgleIskra M Myers, MD 19 mL/hr at 10/26/15 1153 1,900 Units/hr at 10/26/15 1153  . HYDROcodone-acetaminophen (NORCO/VICODIN) 5-325 MG per tablet 1-2 tablet  1-2 tablet Oral Q4H PRN Clydie Braunondell A Smith, MD   1 tablet at 10/26/15 1314  . metoprolol (LOPRESSOR) injection 5 mg  5 mg Intravenous Q6H PRN Dorothea OgleIskra M Myers, MD      . morphine 2 MG/ML injection 2 mg  2 mg Intravenous Q2H PRN Clydie Braunondell A Smith, MD   2 mg at 10/24/15 0427  . potassium chloride SA (K-DUR,KLOR-CON) CR tablet 40 mEq  40 mEq Oral Once Dorothea OgleIskra M Myers, MD      .  warfarin (COUMADIN) tablet 5 mg  5 mg Oral ONCE-1800 Adalberto Cole, RPH      . Warfarin - Pharmacist Dosing Inpatient   Does not apply [id number] Dorothea Ogle, MD   0  at 10/25/15 1100     Discharge Medications: Please see discharge summary for a list of discharge medications.  Relevant Imaging Results:  Relevant Lab Results:  Recent Labs    Additional Information SSN [id number]  Liliana Cline, LCSW

## 2015-10-26 NOTE — [place] FL2 (Signed)
[place] MEDICAID FL2 LEVEL OF CARE SCREENING TOOL     IDENTIFICATION  Patient Name: Jeremy PaganiniJames Lenhardt Birthdate: [birth year] Sex: male Admission Date (Current Location): 10/22/2015  [facility] [place]   Facility and Address:  [facility],  [address], [place] [id number]      Provider Number: [id number]  Attending Physician Name and Address:  Dorothea OgleIskra M Myers, MD  Relative Name and Phone Number:       Current Level of Care: Hospital Recommended Level of Care: Skilled Nursing Facility Prior Approval Number:    Date Approved/Denied:   PASRR Number:    Discharge Plan: SNF    Current Diagnoses: Patient Active Problem List   Diagnosis Date Noted  . Hip fracture (HCC) 10/24/2015  . Tobacco abuse 10/24/2015  . Chronic anticoagulation 10/24/2015  . History of pulmonary embolus (PE) 10/24/2015  . History of DVT (deep vein thrombosis) 10/24/2015  . Hypertension 10/24/2015  . Hip fracture, right (HCC) 10/24/2015  . DVT, lower extremity (HCC)   . Pulmonary emboli (HCC) 07/08/2014  . Smoker 07/08/2014  . 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 Bilateral pulmonary embolism (HCC) 09/17/2013  . DVT, bilateral lower limbs (HCC) 09/17/2013    Orientation ACTIVITIES/SOCIAL BLADDER RESPIRATION    Self, Time, Situation, Place  Active Continent    BEHAVIORAL SYMPTOMS/MOOD NEUROLOGICAL BOWEL NUTRITION STATUS      Continent    PHYSICIAN VISITS COMMUNICATION OF NEEDS Height & Weight Skin    Verbally 6' (182.9 cm) 142 lbs. Normal          AMBULATORY STATUS RESPIRATION    Assist extensive        Personal Care Assistance Level of Assistance  Bathing, Dressing Bathing Assistance: Limited assistance   Dressing Assistance: Limited assistance      Functional Limitations Info  Hearing Sight Info: Adequate Hearing Info: Adequate         SPECIAL CARE FACTORS FREQUENCY  PT (By licensed PT), OT (By licensed OT)     PT Frequency: 5 OT Frequency: 5            Additional Factors Info  Allergies   Allergies Info: FISH ALLERGY           Current Medications (10/26/2015):  This is the current hospital active medication list Current Facility-Administered Medications  Medication Dose Route Frequency Provider Last Rate Last Dose  . acetaminophen (TYLENOL) tablet 1,000 mg  1,000 mg Oral Q6H PRN Gerhard Munchobert Lockwood, MD   1,000 mg at 10/25/15 2003  . atenolol (TENORMIN) tablet 12.5 mg  12.5 mg Oral Daily Gerhard Munchobert Lockwood, MD   12.5 mg at 10/26/15 0911  . docusate sodium (COLACE) capsule 100 mg  100 mg Oral BID Clydie Braunondell A Smith, MD   100 mg at 10/24/15 1014  . heparin ADULT infusion 100 units/mL (25000 units/250 mL)  1,900 Units/hr Intravenous Continuous Dorothea OgleIskra M Myers, MD 19 mL/hr at 10/26/15 1153 1,900 Units/hr at 10/26/15 1153  . HYDROcodone-acetaminophen (NORCO/VICODIN) 5-325 MG per tablet 1-2 tablet  1-2 tablet Oral Q4H PRN Clydie Braunondell A Smith, MD   1 tablet at 10/26/15 1314  . metoprolol (LOPRESSOR) injection 5 mg  5 mg Intravenous Q6H PRN Dorothea OgleIskra M Myers, MD      . morphine 2 MG/ML injection 2 mg  2 mg Intravenous Q2H PRN Clydie Braunondell A Smith, MD   2 mg at 10/24/15 0427  . potassium chloride SA (K-DUR,KLOR-CON) CR tablet 40 mEq  40 mEq Oral Once Dorothea OgleIskra M Myers, MD      .  warfarin (COUMADIN) tablet 5 mg  5 mg Oral ONCE-1800 Adalberto Cole, RPH      . Warfarin - Pharmacist Dosing Inpatient   Does not apply [id number] Dorothea Ogle, MD   0  at 10/25/15 1100     Discharge Medications: Please see discharge summary for a list of discharge medications.  Relevant Imaging Results:  Relevant Lab Results:  Recent Labs    Additional Information SSN [id number]  Liliana Cline, LCSW

## 2015-10-26 NOTE — Clinical Social Work Placement (Signed)
  Placement note started :      CLINICAL SOCIAL WORK PLACEMENT  NOTE  Date:  10/26/2015  Patient Details  Name: Jeremy PaganiniJames Hixon MRN: [id number] Date of Birth: 1946-09-24  Clinical Social Work is seeking post-discharge placement for this patient at the Skilled  Nursing Facility level of care (*CSW will initial, date and re-position this form in  chart as items are completed):  Yes   Patient/family provided with [facility] Clinical Social Work Department's list of facilities offering this level of care within the geographic area requested by the patient (or if unable, by the patient's family).  Yes   Patient/family informed of their freedom to choose among providers that offer the needed level of care, that participate in Medicare, Medicaid or managed care program needed by the patient, have an available bed and are willing to accept the patient.  Yes   Patient/family informed of [facility]'s ownership interest in [facility] and [facility], as well as of the fact that they are under no obligation to receive care at these facilities.  PASRR submitted to EDS on 10/26/15     PASRR number received on 10/26/15     Existing PASRR number confirmed on       FL2 transmitted to all facilities in geographic area requested by pt/family on 10/26/15     FL2 transmitted to all facilities within larger geographic area on 10/26/15     Patient informed that his/her managed care company has contracts with or will negotiate with certain facilities, including the following:            Patient/family informed of bed offers received.  Patient chooses bed at       Physician recommends and patient chooses bed at      Patient to be transferred to   on  .  Patient to be transferred to facility by       Patient family notified on   of transfer.  Name of family member notified:        PHYSICIAN       Additional Comment:    _______________________________________________ Leron Croakassandra  Tali Coster 10/26/2015, 6:00 PM

## 2015-10-26 NOTE — Consult Note (Signed)
Physical Medicine and Rehabilitation Consult Reason for Consult: Minimally displaced right greater trochanteric fracture Referring Physician: [facility]   HPI: Jeremy Herman is a 69 y.o. 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 Lives alone no local family. One level apartment no steps to entry. Presented 10/24/2015 after a fall after he tripped over a construction cone. Denied loss of consciousness. Complaints of right hip pain. CT of right hip showed mildly comminuted fracture involving the right greater femoral trochanter, with minimal displacement. Orthopedic services Dr. Linna Caprice consulted and weightbearing as tolerated no active abduction. Hospital course complicated by pain management. Chronic Coumadin has been resumed with intravenous heparin until INR therapeutic. Physical therapy evaluation completed ongoing with latest recommendations for physical medicine rehabilitation consult.  Review of Systems  Constitutional: Negative for fever and chills.  HENT: Negative for hearing loss.   Eyes: Negative for blurred vision and double vision.  Respiratory: Negative for cough and shortness of breath.   Cardiovascular: Negative for chest pain, palpitations and leg swelling.  Gastrointestinal: Positive for constipation. Negative for nausea and vomiting.  Genitourinary: Negative for dysuria and hematuria.  Musculoskeletal: Positive for myalgias and joint pain.  Skin: Negative for rash.  Neurological: Negative for seizures, loss of consciousness and headaches.  All other systems reviewed and are negative.  Past Medical History  Diagnosis Date  . Medical history non-contributory   . DVT (deep venous thrombosis) (HCC)   . Arthritis   . PE (pulmonary embolism)   . Chronic anticoagulation    Past Surgical History  Procedure Laterality Date  . Ankle surgery  1979    right ankle   Family History    Problem Relation Age of Onset  . Stomach cancer Father   . Aneurysm    . Cancer Mother    Social History:  reports that he has been smoking Cigarettes.  He has a 43 pack-year smoking history. He has never used smokeless tobacco. He reports that he does not drink alcohol or use illicit drugs. Allergies:  Allergies  Allergen Reactions  . Fish Allergy Anaphylaxis and Other (See Comments)    Diarrhea    Medications Prior to Admission  Medication Sig Dispense Refill  . acetaminophen (TYLENOL) 500 MG tablet Take 1,000 mg by mouth every 6 (six) hours as needed for moderate pain or headache.    Marland Kitchen atenolol (TENORMIN) 25 MG tablet Take 0.5 tablets (12.5 mg total) by mouth daily. 90 tablet 1  . warfarin (COUMADIN) 2 MG tablet Take 1 tablet (2 mg total) by mouth daily. (Patient taking differently: Take 2-3 mg by mouth daily. Mon,Wed,Thur,Fri,Sat,Sun take , Tuesday s) 30 tablet 3  . nicotine (NICODERM CQ - DOSED IN MG/24 HOURS) 21 mg/24hr patch Place 1 patch (21 mg total) onto the skin daily. 28 patch 0    Home: Home Living Family/patient expects to be discharged to:: Skilled nursing facility Living Arrangements: Alone Available Help at Discharge: Friend(s), Available PRN/intermittently Home Access: Ramped entrance Home Layout: One level  Functional History: Prior Function Level of Independence: Independent Functional Status:  Mobility: Bed Mobility Overal bed mobility: Needs Assistance Bed Mobility: Supine to Sit Supine to sit: Min guard, HOB elevated Sit to supine: Max assist, +2 for physical assistance, +2 for safety/equipment General bed mobility comments: increased time to perform however pt did not require physical assist, pt used bed rails and HOB elevated for better self assist Transfers Overall transfer level: Needs assistance Equipment used:  Rolling walker (2 wheeled) Transfers: Sit to/from Stand Sit to Stand: Min assist General transfer comment: assist to steady rise,  verbal cues for UE and LE positioning upon return to sitting Ambulation/Gait Ambulation/Gait assistance: Min assist, +2 safety/equipment Ambulation Distance (Feet): 5 Feet Assistive device: Rolling walker (2 wheeled) Gait Pattern/deviations: Step-to pattern, Antalgic General Gait Details: increased time and effort for gait, verbal cues for sequence, WBing, posture, RW positioning, pt performed very small steps in room and recliner brought behind pt due to fatigue (mostly UEs per pt)    ADL:    Cognition: Cognition Overall Cognitive Status: Within Functional Limits for tasks assessed Cognition Arousal/Alertness: Awake/alert Behavior During Therapy: WFL for tasks assessed/performed Overall Cognitive Status: Within Functional Limits for tasks assessed  Blood pressure 116/67, pulse 86, temperature 98.7 F (37.1 C), temperature source Oral, resp. rate 16, height 6' (1.829 m), weight 64.8 kg (142 lb 13.7 oz), SpO2 97 %. Physical Exam  Vitals reviewed. Constitutional: He is oriented to person, place, and time. He appears well-developed. No distress.  HENT:  Head: Normocephalic and atraumatic.  Eyes: Conjunctivae and EOM are normal.  Neck: Normal range of motion. Neck supple. No thyromegaly present.  Cardiovascular: Normal rate and regular rhythm.   Respiratory: Effort normal and breath sounds normal. No respiratory distress.  GI: Soft. Bowel sounds are normal. He exhibits distension.  Musculoskeletal: He exhibits edema and tenderness (Right hip).  UE muscle atrophy  Neurological: He is alert and oriented to person, place, and time.  B/l UE 4/5 proximal to distal (baseline) LLE 5/5 proximal to distal RLE hip flexion 2/5 (pain inhibition), ankle dorsi/plantar flexion 5/5  Skin: Skin is warm and dry.  Psychiatric: He has a normal mood and affect. His behavior is normal.    Results for orders placed or performed during the hospital encounter of 10/22/15 (from the past 24 hour(s))    Heparin level (unfractionated)     Status: Abnormal   Collection Time: 10/25/15  2:06 PM  Result Value Ref Range   Heparin Unfractionated 0.23 (L) 0.30 - 0.70 IU/mL  Protime-INR     Status: Abnormal   Collection Time: 10/26/15  4:34 AM  Result Value Ref Range   Prothrombin Time 15.6 (H) 11.6 - 15.2 seconds   INR 1.22 0.00 - 1.49  CBC     Status: Abnormal   Collection Time: 10/26/15  4:34 AM  Result Value Ref Range   WBC 7.8 4.0 - 10.5 K/uL   RBC 2.87 (L) 4.22 - 5.81 MIL/uL   Hemoglobin 11.8 (L) 13.0 - 17.0 g/dL   HCT 16.1 (L) 09.6 - 04.5 %   MCV 119.5 (H) 78.0 - 100.0 fL   MCH 41.1 (H) 26.0 - 34.0 pg   MCHC 34.4 30.0 - 36.0 g/dL   RDW 40.9 81.1 - 91.4 %   Platelets 131 (L) 150 - 400 K/uL  Heparin level (unfractionated)     Status: Abnormal   Collection Time: 10/26/15  4:34 AM  Result Value Ref Range   Heparin Unfractionated 0.26 (L) 0.30 - 0.70 IU/mL  Basic metabolic panel     Status: Abnormal   Collection Time: 10/26/15  4:34 AM  Result Value Ref Range   Sodium 136 135 - 145 mmol/L   Potassium 3.2 (L) 3.5 - 5.1 mmol/L   Chloride 105 101 - 111 mmol/L   CO2 24 22 - 32 mmol/L   Glucose, Bld 117 (H) 65 - 99 mg/dL   BUN 16 6 - 20 mg/dL  Creatinine, Ser 0.59 (L) 0.61 - 1.24 mg/dL   Calcium 8.0 (L) 8.9 - 10.3 mg/dL   GFR calc non Af Amer >60 >60 mL/min   GFR calc Af Amer >60 >60 mL/min   Anion gap 7 5 - 15   No results found.  Assessment/Plan: Diagnosis: Debility/Minimally displaced right greater trochanteric fracture (non-op) Labs and images independently reviewed.  Records reviewed and summated above.  1. Does the need for close, 24 hr/day medical supervision in concert with the patient's rehab needs make it unreasonable for this patient to be served in a less intensive setting? No  2. Co-Morbidities requiring supervision/potential complications: multiple DVT/pulmonary emboli (cont meds), HTN (monitor and provide prns in accordance with increased physical exertion and  pain), tobacco abuse (cont counseling), post-traumatic pain (Biofeedback training with therapies to help reduce reliance on opiate pain medications, Monitor pain control during therapies, and sedation at rest and titrate to maximum efficacy to ensure participation and gains in therapies), hypokalemia (cont to monitor and replete as necessary), megaloblastic anemia (transfuse if necessary to ensure appropriate perfusion for increased activity tolerance, cont vitamin supplementation), thrombocytopenia (< 60,000/mm3 no resistive exercises) 3. Due to safety, skin/wound care, disease management, medication administration and patient education, does the patient require 24 hr/day rehab nursing? Potentially 4. Does the patient require coordinated care of a physician, rehab nurse, PT (1.5-2 hrs/day, 5 days/week) and OT (1.5-2 hrs/day, 5 days/week) to address physical and functional deficits in the context of the above medical diagnosis(es)? No Addressing deficits in the following areas: balance, endurance, locomotion, strength, transferring, dressing, toileting and psychosocial support 5. Can the patient actively participate in an intensive therapy program of at least 3 hrs of therapy per day at least 5 days per week? Yes 6. The potential for patient to make measurable gains while on inpatient rehab is good 7. Anticipated functional outcomes upon discharge from inpatient rehab are n/a  with PT, n/a with OT, n/a with SLP. 8. Estimated rehab length of stay to reach the above functional goals is: N/A 9. Does the patient have adequate social supports and living environment to accommodate these discharge functional goals? Potentially 10. Anticipated D/C setting: Other 11. Anticipated post D/C treatments: HH therapy and Home excercise program 12. Overall Rehab/Functional Prognosis: good  RECOMMENDATIONS: This patient's condition is appropriate for continued rehabilitative care in the following setting: SNF as pt does  not have medical necessity to justify IRF admission Patient has agreed to participate in recommended program. Yes Note that insurance prior authorization may be required for reimbursement for recommended care.  Comment: Rehab Admissions Coordinator to follow up.  Maryla MorrowAnkit Berlyn Saylor, MD  10/26/2015

## 2015-10-26 NOTE — Consult Note (Signed)
   ORTHOPAEDIC CONSULTATION  REQUESTING PHYSICIAN: Dorothea OgleIskra M Myers, MD  PCP:  Doris CheadleADVANI, DEEPAK, MD  Chief Complaint: RIGHT hip pain  HPI: Jeremy Herman is a 69 y.o. male who tripped over a cord on 11/23. Had progressive right hip pain. Came to ED; xrays negative. CT of R hip showed comminuted GT fx; no femoral neck or pelvic fx. Admitted to hospitalist for pain control.  Past Medical History  Diagnosis Date  . Medical history non-contributory   . DVT (deep venous thrombosis) (HCC)   . Arthritis   . PE (pulmonary embolism)   . Chronic anticoagulation    Past Surgical History  Procedure Laterality Date  . Ankle surgery  1979    right ankle   Social History   Social History  . Marital Status: Married    Spouse Name: N/A  . Number of Children: N/A  . Years of Education: N/A   Social History Main Topics  . Smoking status: Current Every Day Smoker -- 1.00 packs/day for 43 years    Types: Cigarettes  . Smokeless tobacco: Never Used  . Alcohol Use: No     Comment: quit in 2001  . Drug Use: No  . Sexual Activity: Not Asked   Other Topics Concern  . None   Social History Narrative   Family History  Problem Relation Age of Onset  . Stomach cancer Father   . Aneurysm    . Cancer Mother    Allergies  Allergen Reactions  . Fish Allergy Anaphylaxis and Other (See Comments)    Diarrhea    Prior to Admission medications   Medication Sig Start Date End Date Taking? Authorizing Provider  acetaminophen (TYLENOL) 500 MG tablet Take 1,000 mg by mouth every 6 (six) hours as needed for moderate pain or headache.   Yes Historical Provider, MD  atenolol (TENORMIN) 25 MG tablet Take 0.5 tablets (12.5 mg total) by mouth daily. 08/04/15  Yes Quentin Angstlugbemiga E Jegede, MD  warfarin (COUMADIN) 2 MG tablet Take 1 tablet (2 mg total) by mouth daily. Patient taking differently: Take 2-3 mg by mouth daily. Mon,Wed,Thur,Fri,Sat,Sun take 2mg , Tuesday 3mg s 08/18/15  Yes Quentin Angstlugbemiga E Jegede, MD  nicotine  (NICODERM CQ - DOSED IN MG/24 HOURS) 21 mg/24hr patch Place 1 patch (21 mg total) onto the skin daily. 07/09/14   Vassie Lollarlos Madera, MD  traMADol (ULTRAM) 50 MG tablet Take 1 tablet (50 mg total) by mouth every 6 (six) hours as needed for severe pain. 10/22/15   Gerhard Munchobert Lockwood, MD   No results found.  Positive ROS: All other systems have been reviewed and were otherwise negative with the exception of those mentioned in the HPI and as above.  Physical Exam: General: Alert, no acute distress Cardiovascular: No pedal edema Respiratory: No cyanosis, no use of accessory musculature GI: No organomegaly, abdomen is soft and non-tender Skin: No lesions in the area of chief complaint Neurologic: Sensation intact distally Psychiatric: Patient is competent for consent with normal mood and affect Lymphatic: No axillary or cervical lymphadenopathy  MUSCULOSKELETAL: RLE: +TTP greater trochanter. Painless logroll of hip. Able to SLR and hover heel off bed. + TA/GS/EHL. SILT. 2+ DP.  Assessment: R greater trochanter fracture, amenable to nonoperative treatment  Plan: WBAT with walker No active abduction x6 weeks PT/OT DVT ppx: chronic coumadin D/C planning F/U with me 2 weeks after d/c    Garnet KoyanagiSwinteck, Mylan Schwarz Sony, MD Cell [phone]    10/26/2015 12:22 PM

## 2015-10-26 NOTE — Progress Notes (Signed)
ANTICOAGULATION CONSULT NOTE - Follow Up  Pharmacy Consult for IV heparin/warfarin Indication: hx PE/DVT  Allergies  Allergen Reactions  . Fish Allergy Anaphylaxis and Other (See Comments)    Diarrhea     Patient Measurements: Height: 6' (182.9 cm) Weight: 142 lb 13.7 oz (64.8 kg) IBW/kg (Calculated) : 77.6  Heparin dosing weight: 65kg  Vital Signs: Temp: 98.7 F (37.1 C) (11/28 0621) Temp Source: Oral (11/28 0621) BP: 116/67 mmHg (11/28 0900) Pulse Rate: 86 (11/28 0900)  Labs:  Recent Labs  10/24/15 0538  10/25/15 0444  10/25/15 1406 10/26/15 0434 10/26/15 1319  HGB 13.2  --  12.7*  --   --  11.8*  --   HCT 37.9*  --  37.0*  --   --  34.3*  --   PLT 123*  --  121*  --   --  131*  --   APTT 33  --   --   --   --   --   --   LABPROT 15.9*  --  16.4*  --   --  15.6*  --   INR 1.26  --  1.31  --   --  1.22  --   HEPARINUNFRC  --   < >  --   < > 0.23* 0.26* 0.65  CREATININE 0.52*  --  0.64  --   --  0.59*  --   < > = values in this interval not displayed.  Estimated Creatinine Clearance: 81 mL/min (by C-G formula based on Cr of 0.59).   Medications:  Coumadin 2mg  daily except 3mg  on Tuesdays  Assessment: Patient's a 69 y.o M, on coumadin PTA for hx PE/DVT, presented to the ED on 11/24 with c/o R groin pain. CT showed R hip fracture. Pharmacy is asked to dose heparin in anticipation of surgery. Currently, non-operative management.  Today, 10/26/2015: - Heparin level in therapeutic range, but large increase to 0.65 - CBC ok, platelets low but stable - No bleeding documented - No significant drug-drug intxns - INR subtherapeutic, 1.22  Goal of Therapy:  Heparin level 0.3-0.7 units/ml INR 2-3   Plan:  -- Cont heparin infusion at 1900units/hr. -- Recheck heparin level in 6hrs to confirm level due to heparin level increase.  -- Check CBC, INR q24h while on heparin, warfarin -- Warfarin 5 mg x 1    Adalberto ColeNikola Dejuana Weist, PharmD, BCPS Pager [phone] 10/26/2015  2:23 PM

## 2015-10-26 NOTE — Progress Notes (Signed)
Received call from RN CM. Pt potentially will not be pt class as inpt today. Pt would not be a possible  inpt rehab candidate unless meets need for inpt hospitalization. [phone]

## 2015-10-26 NOTE — Progress Notes (Signed)
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 Imaging showed minimally displaced R greater trochanter fx.     PT Comments    Pt assisted with mobility and only able to take a few steps in room.  Pt requires increased time to perform any mobility.  Follow Up Recommendations  SNF (per notes, CIR unable to take pt)     Equipment Recommendations  Rolling walker with 5" wheels;Wheelchair (measurements PT)    Recommendations for Other Services       Precautions / Restrictions Precautions Precautions: Fall Restrictions RLE Weight Bearing: Weight bearing as tolerated Other Position/Activity Restrictions: No Active ABDUCTION    Mobility  Bed Mobility Overal bed mobility: Needs Assistance Bed Mobility: Supine to Sit     Supine to sit: Min guard;HOB elevated     General bed mobility comments: increased time to perform however pt did not require physical assist, pt used bed rails and HOB elevated for better self assist  Transfers Overall transfer level: Needs assistance Equipment used: Rolling walker (2 wheeled) Transfers: Sit to/from Stand Sit to Stand: Min assist         General transfer comment: assist to steady rise, verbal cues for UE and LE positioning upon return to sitting  Ambulation/Gait Ambulation/Gait assistance: Min assist;+2 safety/equipment Ambulation Distance (Feet): 5 Feet Assistive device: Rolling walker (2 wheeled) Gait Pattern/deviations: Step-to pattern;Antalgic     General Gait Details: increased time and effort for gait, verbal cues for sequence, WBing, posture, RW positioning, pt performed very small steps in room and  recliner brought behind pt due to fatigue (mostly UEs per pt)   Stairs            Wheelchair Mobility    Modified Rankin (Stroke Patients Only)       Balance                                    Cognition Arousal/Alertness: Awake/alert Behavior During Therapy: WFL for tasks assessed/performed Overall Cognitive Status: Within Functional Limits for tasks assessed                      Exercises      General Comments        Pertinent Vitals/Pain Pain Assessment: 0-10 Pain Score: 7  Pain Location: R groin Pain Descriptors / Indicators: Cramping;Grimacing;Guarding Pain Intervention(s): Limited activity within patient's tolerance;Premedicated before session;Repositioned;Monitored during session (pt reports he was premedicated, declined ice)    Home Living                      Prior Function            PT Goals (current goals can now be found in the care plan section) Progress towards PT goals: Progressing toward goals    Frequency  Min 6X/week    PT Plan Discharge plan needs to be updated    Co-evaluation             End of Session Equipment Utilized During Treatment: Gait belt Activity Tolerance: Patient limited by fatigue;Patient limited by pain Patient left: in chair;with call bell/phone within reach  Time: 9562-1308 PT Time Calculation (min) (ACUTE ONLY): 22 min  Charges:  $Gait Training: 8-22 mins                    G Codes:      Karne Ozga,KATHrine E 2015/11/05, 12:04 PM Zenovia Jarred, PT, DPT 11/05/2015 Pager: [phone]

## 2015-10-26 NOTE — Progress Notes (Signed)
Pharmacy re: Heparin follow-up  [facility] for full history.  Heparin level = 0.54 on 1900 units/hr (goal 0.4-0.7) No bleeding reported.   Continue heparin infusion at current rate. F/U daily heparin labs.   Junita PushMichelle Chundra Sauerwein, PharmD, BCPS Pager: [phone] 10/26/2015@8 :30 PM

## 2015-10-26 NOTE — Progress Notes (Signed)
ANTICOAGULATION CONSULT NOTE - Follow Up Consult  Pharmacy Consult for Heparin Indication: pulmonary embolus and DVT  Allergies  Allergen Reactions  . Fish Allergy Anaphylaxis and Other (See Comments)    Diarrhea     Patient Measurements: Height: 6' (182.9 cm) Weight: 142 lb 13.7 oz (64.8 kg) IBW/kg (Calculated) : 77.6 Heparin Dosing Weight:   Vital Signs: Temp: 97.7 F (36.5 C) (11/27 1900) Temp Source: Oral (11/27 1900) BP: 123/72 mmHg (11/27 1900) Pulse Rate: 116 (11/27 1900)  Labs:  Recent Labs  10/24/15 0538  10/25/15 0444 10/25/15 0818 10/25/15 1406 10/26/15 0434  HGB 13.2  --  12.7*  --   --  11.8*  HCT 37.9*  --  37.0*  --   --  34.3*  PLT 123*  --  121*  --   --  131*  APTT 33  --   --   --   --   --   LABPROT 15.9*  --  16.4*  --   --  15.6*  INR 1.26  --  1.31  --   --  1.22  HEPARINUNFRC  --   < >  --  0.32 0.23* 0.26*  CREATININE 0.52*  --  0.64  --   --  0.59*  < > = values in this interval not displayed.  Estimated Creatinine Clearance: 81 mL/min (by C-G formula based on Cr of 0.59).   Medications:  Infusions:  . heparin 1,750 Units/hr (10/25/15 2238)    Assessment: Patient with low heparin level again.  No heparin issues per RN.  Goal of Therapy:  Heparin level 0.3-0.7 units/ml Monitor platelets by anticoagulation protocol: Yes   Plan:  Increase heparin to 1900 units/hr Recheck level at [address]  Aiman Sonn Jr, LuquilloJulian Crowford 10/26/2015,5:53 AM

## 2015-10-27 LAB — CBC
HCT: 36.4 % — ABNORMAL LOW (ref 39.0–52.0)
HEMOGLOBIN: 12.4 g/dL — AB (ref 13.0–17.0)
MCH: 40.8 pg — AB (ref 26.0–34.0)
MCHC: 34.1 g/dL (ref 30.0–36.0)
MCV: 119.7 fL — ABNORMAL HIGH (ref 78.0–100.0)
Platelets: 158 10*3/uL (ref 150–400)
RBC: 3.04 MIL/uL — AB (ref 4.22–5.81)
RDW: 13.9 % (ref 11.5–15.5)
WBC: 7.3 10*3/uL (ref 4.0–10.5)

## 2015-10-27 LAB — BASIC METABOLIC PANEL
ANION GAP: 6 (ref 5–15)
BUN: 10 mg/dL (ref 6–20)
CO2: 26 mmol/L (ref 22–32)
Calcium: 8.3 mg/dL — ABNORMAL LOW (ref 8.9–10.3)
Chloride: 103 mmol/L (ref 101–111)
Creatinine, Ser: 0.48 mg/dL — ABNORMAL LOW (ref 0.61–1.24)
GFR calc Af Amer: >60 mL/min (ref >60)
GLUCOSE: 104 mg/dL — AB (ref 65–99)
POTASSIUM: 3.6 mmol/L (ref 3.5–5.1)
SODIUM: 135 mmol/L (ref 135–145)

## 2015-10-27 LAB — PROTIME-INR
INR: 1.16 (ref 0.00–1.49)
PROTHROMBIN TIME: 15 s (ref 11.6–15.2)

## 2015-10-27 LAB — HEPARIN LEVEL (UNFRACTIONATED): HEPARIN UNFRACTIONATED: 0.52 [IU]/mL (ref 0.30–0.70)

## 2015-10-27 LAB — MAGNESIUM: MAGNESIUM: 1.3 mg/dL — AB (ref 1.7–2.4)

## 2015-10-27 MED ORDER — ENOXAPARIN SODIUM 100 MG/ML ~~LOC~~ SOLN: 100.0000 mg | SUBCUTANEOUS | Status: DC

## 2015-10-27 MED ORDER — HYDROCODONE-ACETAMINOPHEN 5-325 MG PO TABS: 1.0000 | ORAL_TABLET | ORAL | Status: DC | PRN

## 2015-10-27 MED ORDER — WARFARIN SODIUM 5 MG PO TABS: 5.0000 mg | ORAL_TABLET | Freq: Every day | ORAL | Status: DC

## 2015-10-27 MED ORDER — MAGNESIUM SULFATE 2 GM/50ML IV SOLN
2.0000 g | Freq: Once | INTRAVENOUS | Status: AC
  Administered 2015-10-27: 2 g via INTRAVENOUS
  Filled 2015-10-27: qty 50

## 2015-10-27 MED ORDER — ENOXAPARIN SODIUM 100 MG/ML ~~LOC~~ SOLN
100.0000 mg | SUBCUTANEOUS | Status: DC
  Administered 2015-10-27: 100 mg via SUBCUTANEOUS
  Filled 2015-10-27: qty 1

## 2015-10-27 MED ORDER — TRAMADOL HCL 50 MG PO TABS: 50.0000 mg | ORAL_TABLET | Freq: Four times a day (QID) | ORAL | Status: DC | PRN

## 2015-10-27 MED ORDER — DOCUSATE SODIUM 100 MG PO CAPS: 100.0000 mg | ORAL_CAPSULE | Freq: Two times a day (BID) | ORAL | Status: DC

## 2015-10-27 NOTE — Discharge Summary (Addendum)
Physician Discharge Summary  Jeremy Herman KGM:[id number] DOB: [birth year] DOA: 10/22/2015  PCP: Doris Cheadle, MD  Admit date: 10/22/2015 Discharge date: 10/27/2015  Recommendations for Outpatient Follow-up:  1. Pt will need to follow up with PCP in 1 week post discharge 2. Please obtain BMP to evaluate electrolytes and kidney function, Mg level 3. Please note that Mg was supplemented prior to discharge  4. Please also check CBC to evaluate Hg and Hct levels 5. Pt advised to take Coumadin 5 mg PO and to have PT/INR checked in AM 11/30 and adjust the dose as indicated  6. 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 Upon arrival to the hospital initial x-rays did not show acute fracture however CT imaging showed a mildly communicated fracture of the right hip.   Assessment/Plan:    Principal Problem:  Hip fracture (HCC), right, acute  - Mildly comminuted fracture involving the right greater femoral trochanter, with minimal displacement - non operative management  - continue analgesia as needed - PT following, still requiring two people assistance with ambulation  - SNF today   Active Problems:  Leukocytosis - likely reactive from fall  - resolved    History of pulmonary embolus (PE) and History of DVT (deep vein thrombosis) - pt presented with acute  dyspnea back in 2013 when he was first diagnosed with PE and RLE DVT - he was started on lovenox and xarelto upon discharge at that time only to come back with progression of the blood clot to LLE - INR still low, placed on Lovenox 100 mg SQ to continue bridging with Coumadin  - will need PT/INR 11/30 and adjust the dose as indicated  - once INR 2-3, can stop Lovenox    Hypertension, essential with asymptomatic tachycardia  - continue Atenolol    Thrombocytopenia - no signs of bleeding, resolved    Hypokalemia - with low Mg, supplement Mg and repeat on follow up with PCP    Hyponatremia  - resolved    Code Status: Full.  Family Communication: plan of care discussed with the patient Disposition Plan: SNF   IV access:  Peripheral IV  Procedures and diagnostic studies:   Dg Pelvis 1-2 Views 10/22/2015 Degenerative changes at the bilateral hips, right greater than left, and within the lower lumbar spine, as detailed above. 2. No acute findings.  Ct Hip Right Wo Contrast 10/24/2015 Mildly comminuted fracture involving the right greater femoral trochanter, with minimal displacement. Right femoral neck appears intact. Fragmentation is more prominent at the posterior aspect of the right greater femoral trochanter. 2. Overlying mild soft tissue injury, with hazy soft tissue edema. No well-defined hematoma seen. 3. Scattered vascular calcifications seen.   Dg Chest Port 1 View 10/24/2015 Mild bilateral midlung scarring noted. Lungs otherwise clear   Medical Consultants:  Ortho   Other Consultants:  PT  IAnti-Infectives:   None  MAGICK-Orla Jolliff,  MD [facility] Pager [phone]      Discharge Exam: Filed Vitals:   10/27/15 0400 10/27/15 0837  BP: 115/51 132/71  Pulse: 91 97  Temp: 98.9 F (37.2 C)   Resp: 16    Filed Vitals:   10/26/15 1500 10/26/15 2045 10/27/15 0400 10/27/15 0837  BP: 125/75 112/67 115/51 132/71  Pulse: 76 91 91 97   Temp: 98.3 F (36.8 C) 99.3 F (37.4 C) 98.9 F (37.2 C)   TempSrc: Oral Oral Oral   Resp: 15 14 16    Height:      Weight:      SpO2: 96% 95% 97% 97%    General: Pt is alert, follows commands appropriately, not in acute distress Cardiovascular: Regular rate and rhythm, no rubs, no gallops Respiratory: Clear to auscultation bilaterally, no wheezing, no crackles, no rhonchi Abdominal: Soft, non tender, non distended, bowel sounds +, no guarding   Discharge Instructions  Discharge Instructions    Diet - low sodium heart healthy    Complete by:  As directed      Increase activity slowly    Complete by:  As directed             Medication List    TAKE these medications        acetaminophen 500 MG tablet  Commonly known as:  TYLENOL  Take 1,000 mg by mouth every 6 (six) hours as needed for moderate pain or headache.     atenolol 25 MG tablet  Commonly known as:  TENORMIN  Take 0.5 tablets (12.5 mg total) by mouth daily.     docusate sodium 100 MG capsule  Commonly known as:  COLACE  Take 1 capsule (100 mg total) by mouth 2 (two) times daily.     HYDROcodone-acetaminophen 5-325 MG tablet  Commonly known as:  NORCO/VICODIN  Take 1-2 tablets by mouth every 4 (four) hours as needed for moderate pain.     nicotine 21 mg/24hr patch  Commonly known as:  NICODERM CQ - dosed in mg/24 hours  Place 1 patch (21 mg total) onto the skin daily.     traMADol 50 MG tablet  Commonly known as:  ULTRAM  Take 1 tablet (50 mg total) by mouth every 6 (six) hours as needed for moderate pain or severe pain.     warfarin 5 MG tablet  Commonly known as:  COUMADIN  Take 1 tablet (5 mg total) by mouth daily.           Follow-up Information    Schedule an appointment as soon as possible for a visit with Doris CheadleADVANI, DEEPAK, MD.   Specialty:  Internal Medicine      Follow up with Cammy CopaEAN,GREGORY SCOTT, MD. Schedule an appointment as soon as possible for a visit in 1 week.   Specialty:   Orthopedic Surgery   Why:  As needed   Contact information:   [address] [place] [address] [phone]        The results of significant diagnostics from this hospitalization (including imaging, microbiology, ancillary and laboratory) are listed below for reference.     Microbiology: No results found for this or any previous visit (from the past 240 hour(s)).   Labs: Basic Metabolic Panel:  Recent Labs Lab 10/24/15 0538 10/25/15 0444 10/26/15 0434 10/27/15 0517  NA 132* 130* 136 135  K 3.5 3.1* 3.2* 3.6  CL 100* 99* 105 103  CO2 25 22 24 26   GLUCOSE 108* 122* 117* 104*  BUN CREATININE 0.52* 0.64 0.59* 0.48*  CALCIUM 8.1* 7.8* 8.0* 8.3*  MG  --   --   --  1.3*   CBC:  Recent Labs Lab 10/22/15 1300 10/24/15 0538 10/25/15 0444 10/26/15 0434 10/27/15 0517  WBC 11.0* 12.5* 10.0 7.8 7.3  HGB 14.1 13.2 12.7* 11.8* 12.4*  HCT 41.0 37.9* 37.0* 34.3* 36.4*  MCV 120.2* 119.2* 119.4* 119.5* 119.7*  PLT 126* 123* 121* 131* 158     SIGNED: Time coordinating discharge: 30 minutes  MAGICK-Kahil Agner, MD  [facility] 10/27/2015, 10:23 AM Pager [phone]  If 7PM-7AM, please contact night-coverage [email or web address] Password [email or web address]

## 2015-10-27 NOTE — Progress Notes (Signed)
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 Imaging showed minimally displaced R greater trochanter fx.     PT Comments    Pt required increased time and much positive reinforcement to increase self performance. Assisted OOB to [facility] and amb a limited distance.    Follow Up Recommendations        Equipment Recommendations       Recommendations for Other Services       Precautions / Restrictions Precautions Precautions: Fall Restrictions Weight Bearing Restrictions: No RLE Weight Bearing: Weight bearing as tolerated Other Position/Activity Restrictions: No Active ABDUCTION    Mobility  Bed Mobility Overal bed mobility: Needs Assistance Bed Mobility: Supine to Sit     Supine to sit: Min guard;HOB elevated     General bed mobility comments: increased time to perform however pt did not require physical assist, pt used bed rails and HOB elevated for better self assist  Transfers Overall transfer level: Needs assistance Equipment used: Rolling walker (2 wheeled) Transfers: Sit to/from Stand Sit to Stand: Min assist;Mod assist         General transfer comment: assist to steady rise, verbal cues for UE and LE positioning upon return to sitting.  Very unsteady.  Some what nervous  Ambulation/Gait Ambulation/Gait assistance: Min assist;+2 physical assistance;+2 safety/equipment Ambulation Distance (Feet): 7 Feet (4 feet, 3 feet) Assistive device: Rolling walker (2 wheeled) Gait Pattern/deviations: Step-to pattern;Decreased stance time - right;Narrow base of support Gait velocity: decreased   General Gait Details: increased time and effort for  gait, verbal cues for sequence, WBing, posture, RW positioning.  Increased, increased time.  Mild anxiety/nervousness    Stairs            Wheelchair Mobility    Modified Rankin (Stroke Patients Only)       Balance                                    Cognition Arousal/Alertness: Awake/alert Behavior During Therapy: WFL for tasks assessed/performed Overall Cognitive Status: Within Functional Limits for tasks assessed                      Exercises      General Comments        Pertinent Vitals/Pain Pain Assessment: 0-10 Pain Score: 7  Pain Location: R groin  Pain Descriptors / Indicators: Grimacing;Cramping;Guarding Pain Intervention(s): Monitored during session;Premedicated before session;Repositioned    Home Living                      Prior Function            PT Goals (current goals can now be found in the care plan section) Progress towards PT goals: Progressing toward goals    Frequency  Min 6X/week    PT Plan      Co-evaluation             End of Session Equipment Utilized During Treatment: Gait belt Activity Tolerance: Patient limited by fatigue;Patient limited by pain Patient left: in chair;with call bell/phone within reach     Time: 1040-1055 PT Time Calculation (min) (ACUTE ONLY): 15  min  Charges:  $Gait Training: 8-22 mins                    G Codes:      Felecia ShellingLori Rushawn Capshaw  PTA [facility]  Acute  Rehab Pager      [phone]

## 2015-10-27 NOTE — Progress Notes (Signed)
Pt d/c'd to [facility]. Report given to Jonny RuizJohn, RN at the facility.

## 2015-10-27 NOTE — Clinical Social Work Placement (Signed)
   CLINICAL SOCIAL WORK PLACEMENT  NOTE  Date:  10/27/2015  Patient Details  Name: Jeremy Herman MRN: [id number] Date of Birth: 1946-08-05  Clinical Social Work is seeking post-discharge placement for this patient at the Skilled  Nursing Facility level of care (*CSW will initial, date and re-position this form in  chart as items are completed):  Yes   Patient/family provided with [place] Clinical Social Work Department's list of facilities offering this level of care within the geographic area requested by the patient (or if unable, by the patient's family).  Yes   Patient/family informed of their freedom to choose among providers that offer the needed level of care, that participate in Medicare, Medicaid or managed care program needed by the patient, have an available bed and are willing to accept the patient.  Yes   Patient/family informed of [facility]'s ownership interest [facility], as well as of the fact that they are under no obligation to receive care at these facilities.  PASRR submitted to EDS on 10/26/15     PASRR number received on 10/26/15     Existing PASRR number confirmed on       FL2 transmitted to all facilities in geographic area requested by pt/family on 10/26/15     FL2 transmitted to all facilities within larger geographic area on 10/26/15     Patient informed that his/her managed care company has contracts with or will negotiate with certain facilities, including the following:        Yes   Patient/family informed of bed offers received.  [facility]     Physician recommends and patient chooses bed at      [facility]  Patient to be transferred to facility by [facility]     Patient family notified on 10/27/15 of transfer.  Name of family member notified:  Pt contacted his partern directly.     PHYSICIAN       Additional  Comment: Pt is in agreement with d/c to SNF today. [facility] transport is required. Pt is aware out of pocket costs may be associated with [facility] transport. NSG reviewed d/c summary, scripts, avs. Scripts included in d/c packet. D/C summary sent to SNF for review prior to d/c.   _______________________________________________ Royetta AsalHaidinger, Xzavian Semmel Lee, LCSW  [phone] 10/27/2015, 1:34 PM

## 2015-10-27 NOTE — Care Management Important Message (Signed)
Important Message  Patient Details  Name: Jeremy Herman MRN: [id number] Date of Birth: 1946-06-04   Medicare Important Message Given:  Yes    Haskell FlirtJamison, Meeghan Skipper 10/27/2015, 11:46 AMImportant Message  Patient Details  Name: Jeremy Herman MRN: [id number] Date of Birth: 1946-06-04   Medicare Important Message Given:  Yes    Haskell FlirtJamison, Jawad Wiacek 10/27/2015, 11:46 AM

## 2015-11-10 ENCOUNTER — Encounter: Payer: Medicare Other | Admitting: Pharmacist

## 2015-11-20 NOTE — Progress Notes (Signed)
I am closing these encounters as I am responsible for the Coumadin Clinic now and the provider at this time is no longer employed at our facility to close them. I did not provide this service on this date.   Stacey Karl, PharmD, BCPS, CPP Clinical Pharmacist Practitioner  [facility] [phone]  

## 2015-11-20 NOTE — Progress Notes (Signed)
I am closing these encounters as I am responsible for the Coumadin Clinic now and the provider at this time is no longer employed at our facility to close them. I did not provide this service on this date.   Nickayla Mcinnis Karl, PharmD, BCPS, CPP Clinical Pharmacist Practitioner  [facility] [phone]  

## 2015-11-20 NOTE — Progress Notes (Signed)
I am closing these encounters as I am responsible for the Coumadin Clinic now and the provider at this time is no longer employed at our facility to close them. I did not provide this service on this date.   Isabell Bonafede Karl, PharmD, BCPS, CPP Clinical Pharmacist Practitioner  [facility] [phone]  

## 2015-12-08 ENCOUNTER — Encounter: Payer: Medicare Other | Admitting: Pharmacist

## 2015-12-15 ENCOUNTER — Ambulatory Visit: Payer: Medicare Other | Attending: Family Medicine | Admitting: Pharmacist

## 2015-12-15 DIAGNOSIS — I2699 Other pulmonary embolism without acute cor pulmonale: Secondary | ICD-10-CM | POA: Diagnosis not present

## 2015-12-15 DIAGNOSIS — R002 Palpitations: Secondary | ICD-10-CM

## 2015-12-15 DIAGNOSIS — F17200 Nicotine dependence, unspecified, uncomplicated: Secondary | ICD-10-CM

## 2015-12-15 LAB — POCT INR

## 2015-12-15 MED ORDER — PHYTONADIONE 5 MG PO TABS
2.5000 mg | ORAL_TABLET | Freq: Once | ORAL | Status: AC
  Administered 2015-12-15: 2.5 mg via ORAL

## 2015-12-15 NOTE — Progress Notes (Addendum)
Pharmacy Anticoagulation Clinic  Subjective: Patient presents today for INR monitoring. Anticoagulation indication is bilateral PE and DVT.   Current dose of warfarin: 6 mg daily  Adherence to warfarin: denies any missed doses Signs/symptoms of bleeding: denies Recent changes in diet: change in diet after returning home from skilled nursing - not eating as healthy Recent changes in medications: none Upcoming procedures that may impact anticoagulation: none   Objective: Today's INR = >8.0   Assessment and Plan: Anticoagulation: Patient is Supratherapeutic based on patient's INR of >8.0 and patient's INR goal of 2-3. Will hold warfarin and administer vitamin K 2.5 mg x 1. Patient at a higher risk of bleeding due to fall risk. No current signs or symptoms of bleeding.  INR likely greatly increased due to being released home and patient's return to normal diet. I expect that he will need a dose much closer to the dose he was on prior to hospitalization in November 2016.   Patient verbalized understanding and was provided with written instructions. Next INR check planned for 12/17/15.

## 2015-12-17 ENCOUNTER — Ambulatory Visit: Payer: Medicare Other | Attending: Family Medicine | Admitting: Pharmacist

## 2015-12-17 DIAGNOSIS — F17200 Nicotine dependence, unspecified, uncomplicated: Secondary | ICD-10-CM

## 2015-12-17 DIAGNOSIS — I2699 Other pulmonary embolism without acute cor pulmonale: Secondary | ICD-10-CM

## 2015-12-17 LAB — POCT INR: INR: 4.8

## 2015-12-17 MED ORDER — WARFARIN SODIUM 2 MG PO TABS: 2.0000 mg | ORAL_TABLET | Freq: Every day | ORAL | Status: DC

## 2015-12-17 MED FILL — WARFARIN SODIUM 2 MG TABLET: 2 | 30 days supply | Qty: 30 | Fill #0

## 2015-12-22 ENCOUNTER — Ambulatory Visit: Payer: Medicare Other | Attending: Family Medicine | Admitting: Pharmacist

## 2015-12-22 DIAGNOSIS — I2699 Other pulmonary embolism without acute cor pulmonale: Secondary | ICD-10-CM

## 2015-12-22 DIAGNOSIS — R002 Palpitations: Secondary | ICD-10-CM

## 2015-12-22 DIAGNOSIS — F17200 Nicotine dependence, unspecified, uncomplicated: Secondary | ICD-10-CM

## 2015-12-22 LAB — POCT INR: INR: 2

## 2015-12-22 MED ORDER — ATENOLOL 25 MG PO TABS: 12.5000 mg | ORAL_TABLET | Freq: Every day | ORAL | Status: DC

## 2015-12-22 MED FILL — ?ATENOLOL 25 MG TABLET: 25 | 30 days supply | Qty: 15 | Fill #0

## 2015-12-29 ENCOUNTER — Ambulatory Visit: Payer: Medicare Other | Attending: Internal Medicine | Admitting: Pharmacist

## 2015-12-29 DIAGNOSIS — I2699 Other pulmonary embolism without acute cor pulmonale: Secondary | ICD-10-CM

## 2015-12-29 DIAGNOSIS — F17200 Nicotine dependence, unspecified, uncomplicated: Secondary | ICD-10-CM

## 2015-12-29 LAB — POCT INR: INR: 2.2

## 2016-01-14 ENCOUNTER — Ambulatory Visit: Payer: Medicare Other | Attending: Family Medicine | Admitting: Pharmacist

## 2016-01-14 DIAGNOSIS — F17200 Nicotine dependence, unspecified, uncomplicated: Secondary | ICD-10-CM

## 2016-01-14 DIAGNOSIS — I2699 Other pulmonary embolism without acute cor pulmonale: Secondary | ICD-10-CM

## 2016-01-14 LAB — POCT INR: INR: 1.9

## 2016-01-27 MED FILL — ATENOLOL 25 MG TABLET: 25 | 30 days supply | Qty: 15 | Fill #1

## 2016-01-28 ENCOUNTER — Ambulatory Visit: Payer: Medicare Other | Admitting: Pharmacist

## 2016-02-02 ENCOUNTER — Ambulatory Visit: Payer: Medicare Other | Attending: Internal Medicine | Admitting: Pharmacist

## 2016-02-02 DIAGNOSIS — I2699 Other pulmonary embolism without acute cor pulmonale: Secondary | ICD-10-CM

## 2016-02-02 DIAGNOSIS — F17200 Nicotine dependence, unspecified, uncomplicated: Secondary | ICD-10-CM

## 2016-02-02 LAB — POCT INR: INR: 1.5

## 2016-02-08 MED FILL — WARFARIN SODIUM 2 MG TABLET: 2 | 30 days supply | Qty: 30 | Fill #1

## 2016-02-09 ENCOUNTER — Ambulatory Visit: Payer: Medicare Other | Attending: Family Medicine | Admitting: Family Medicine

## 2016-02-09 ENCOUNTER — Ambulatory Visit: Payer: Medicare Other | Admitting: Pharmacist

## 2016-02-09 ENCOUNTER — Encounter: Payer: Self-pay | Admitting: Family Medicine

## 2016-02-09 VITALS — BP 120/76 | HR 69 | Temp 98.8°F | Resp 16 | Ht 72.0 in | Wt 146.0 lb

## 2016-02-09 DIAGNOSIS — F17210 Nicotine dependence, cigarettes, uncomplicated: Secondary | ICD-10-CM | POA: Diagnosis not present

## 2016-02-09 DIAGNOSIS — R002 Palpitations: Secondary | ICD-10-CM | POA: Diagnosis not present

## 2016-02-09 DIAGNOSIS — F17200 Nicotine dependence, unspecified, uncomplicated: Secondary | ICD-10-CM

## 2016-02-09 DIAGNOSIS — Z7901 Long term (current) use of anticoagulants: Secondary | ICD-10-CM | POA: Insufficient documentation

## 2016-02-09 DIAGNOSIS — R42 Dizziness and giddiness: Secondary | ICD-10-CM | POA: Insufficient documentation

## 2016-02-09 DIAGNOSIS — I2699 Other pulmonary embolism without acute cor pulmonale: Secondary | ICD-10-CM

## 2016-02-09 DIAGNOSIS — Z79899 Other long term (current) drug therapy: Secondary | ICD-10-CM | POA: Insufficient documentation

## 2016-02-09 DIAGNOSIS — Z0000 Encounter for general adult medical examination without abnormal findings: Secondary | ICD-10-CM | POA: Diagnosis not present

## 2016-02-09 DIAGNOSIS — M25551 Pain in right hip: Secondary | ICD-10-CM | POA: Insufficient documentation

## 2016-02-09 DIAGNOSIS — I10 Essential (primary) hypertension: Secondary | ICD-10-CM | POA: Insufficient documentation

## 2016-02-09 LAB — CBC
HEMATOCRIT: 43.4 % (ref 39.0–52.0)
Hemoglobin: 14.6 g/dL (ref 13.0–17.0)
MCH: 38.1 pg — AB (ref 26.0–34.0)
MCHC: 33.6 g/dL (ref 30.0–36.0)
MCV: 113.3 fL — AB (ref 78.0–100.0)
MPV: 9.7 fL (ref 8.6–12.4)
PLATELETS: 215 10*3/uL (ref 150–400)
RBC: 3.83 MIL/uL — ABNORMAL LOW (ref 4.22–5.81)
RDW: 21.1 % — AB (ref 11.5–15.5)
WBC: 8.3 10*3/uL (ref 4.0–10.5)

## 2016-02-09 LAB — BASIC METABOLIC PANEL
BUN: 10 mg/dL (ref 7–25)
CALCIUM: 9.2 mg/dL (ref 8.6–10.3)
CHLORIDE: 104 mmol/L (ref 98–110)
CO2: 25 mmol/L (ref 20–31)
CREATININE: 0.53 mg/dL — AB (ref 0.70–1.25)
Glucose, Bld: 103 mg/dL — ABNORMAL HIGH (ref 65–99)
Potassium: 4.6 mmol/L (ref 3.5–5.3)
Sodium: 138 mmol/L (ref 135–146)

## 2016-02-09 LAB — POCT INR: INR: 1.2

## 2016-02-09 LAB — POCT GLYCOSYLATED HEMOGLOBIN (HGB A1C): Hemoglobin A1C: 5

## 2016-02-09 MED ORDER — DILTIAZEM HCL ER COATED BEADS 120 MG PO CP24: 120.0000 mg | ORAL_CAPSULE | Freq: Every day | ORAL | Status: DC

## 2016-02-09 MED ORDER — TRAMADOL HCL 50 MG PO TABS: 50.0000 mg | ORAL_TABLET | Freq: Four times a day (QID) | ORAL | Status: DC | PRN

## 2016-02-09 MED FILL — dilTIAZem CD 120 MG CP24: 120 | 30 days supply | Qty: 30 | Fill #0

## 2016-02-09 MED FILL — traMADol HCL 50 MG TABS: 50 | 15 days supply | Qty: 60 | Fill #0

## 2016-02-09 NOTE — Progress Notes (Signed)
F/U HTN, Medicine refill  Rt Hip pain, pain scale at home #4 Pain Scale #0 Tobacco user 1ppday  No suicidal thoughts in the past two weeks

## 2016-02-09 NOTE — Assessment & Plan Note (Signed)
A: palpitations x 30 years suspect SVT P: Change from atenolol to diltiazem at HR low on atenolol and patient reports increased episode of symptomatic palpitations Plan for cards referrals for Holter monitoring, patient asked to wait until he can get in life in order.

## 2016-02-09 NOTE — Assessment & Plan Note (Signed)
A; BP well controlled on atenolol P: Change to diltiazem Monitor BB

## 2016-02-09 NOTE — Progress Notes (Signed)
Subjective:  Patient ID: Jeremy Herman, male    DOB: [birth year]  Age: 70 y.o. MRN: [id number]  CC: Hypertension and Hip Pain   HPI Jeremy PaganiniJames Deeney presents for   1. CHRONIC HYPERTENSION  Disease Monitoring  Blood pressure range: not checking   Chest pain: no   Dyspnea: no   Claudication: no   Medication compliance: yes  Medication Side Effects  Lightheadedness: yes, with palpitations    Urinary frequency: no   Edema: no     3. Palpitations: started at age 70. Had cardiology w/u with normal stress test, EKGs, CXR etc in office. Never had Holter monitor. Has been on BB off and on since age 70. Reports palpitations occurs 2-3 times per week, last 2 minutes, associated with fatigue, SOB and dizziness. Exacerbated by anxiety and being over heated. He feels like they are occuring more frequently. He would like an increase dose on atenolol.   2. R hip pain: chronic since hip fracture in 09/2015. He did not have surgery due to fracture being very thing. He has anterior pain radiates to groin. He walks with a walker outside. He uses no assistance at home. He has been released from PT and is doing home exercises. Tramadol controls pain. He takes one pill 0-1 times daily.   Social History  Substance Use Topics  . Smoking status: Current Every Day Smoker -- 1.00 packs/day for 43 years    Types: Cigarettes  . Smokeless tobacco: Never Used  . Alcohol Use: No     Comment: quit in 2001   Past Surgical History  Procedure Laterality Date  . Ankle surgery  1979    right ankle    Outpatient Prescriptions Prior to Visit  Medication Sig Dispense Refill  . acetaminophen (TYLENOL) 500 MG tablet Take 1,000 mg by mouth every 6 (six) hours as needed for moderate pain or headache.    Marland Kitchen. atenolol (TENORMIN) 25 MG tablet Take 0.5 tablets (12.5 mg total) by mouth daily. 90 tablet 0  . traMADol (ULTRAM) 50 MG tablet Take 1 tablet (50 mg total) by mouth every 6 (six) hours as needed for moderate pain or  severe pain. 10 tablet 0  . warfarin (COUMADIN) 2 MG tablet Take 1 tablet (2 mg total) by mouth daily. 30 tablet 3  . docusate sodium (COLACE) 100 MG capsule Take 1 capsule (100 mg total) by mouth 2 (two) times daily. 10 capsule 0  . HYDROcodone-acetaminophen (NORCO/VICODIN) 5-325 MG tablet Take 1-2 tablets by mouth every 4 (four) hours as needed for moderate pain. 30 tablet 0  . nicotine (NICODERM CQ - DOSED IN MG/24 HOURS) 21 mg/24hr patch Place 1 patch (21 mg total) onto the skin daily. 28 patch 0   No facility-administered medications prior to visit.    ROS Review of Systems  Constitutional: Negative for fever, chills, fatigue and unexpected weight change.  Eyes: Negative for visual disturbance.  Respiratory: Negative for cough and shortness of breath.   Cardiovascular: Positive for palpitations. Negative for chest pain and leg swelling.  Gastrointestinal: Negative for nausea, vomiting, abdominal pain, diarrhea, constipation and blood in stool.  Endocrine: Negative for polydipsia, polyphagia and polyuria.  Musculoskeletal: Positive for arthralgias (R hip ) and gait problem. Negative for myalgias, back pain and neck pain.  Skin: Negative for rash.  Allergic/Immunologic: Negative for immunocompromised state.  Neurological: Positive for dizziness and light-headedness.  Hematological: Negative for adenopathy. Does not bruise/bleed easily.  Psychiatric/Behavioral: Negative for suicidal ideas, sleep disturbance and dysphoric mood.  The patient is not nervous/anxious.     Objective:  BP 120/76 mmHg  Pulse 69  Temp(Src) 98.8 F (37.1 C) (Oral)  Resp 16  Ht 6' (1.829 m)  Wt 146 lb (66.225 kg)  BMI 19.80 kg/m2  SpO2 98%  BP/Weight 02/09/2016 10/27/2015 10/24/2015  Systolic BP 120 132 -  Diastolic BP 76 71 -  Wt. (Lbs) 146 - 142.86  BMI 19.8 - 19.37    Physical Exam  Constitutional: He appears well-developed and well-nourished. No distress.  HENT:  Head: Normocephalic and  atraumatic.  Neck: Normal range of motion. Neck supple.  Cardiovascular: Normal rate, regular rhythm, normal heart sounds and intact distal pulses.   Pulmonary/Chest: Effort normal and breath sounds normal.  Musculoskeletal: He exhibits no edema.  Neurological: He is alert. Gait abnormal.  Skin: Skin is warm and dry. No rash noted. No erythema.  Psychiatric: He has a normal mood and affect.   Lab Results  Component Value Date   HGBA1C 5 1 02/09/2016    Assessment & Plan:   Keandre was seen today for hypertension and hip pain.  Diagnoses and all orders for this visit:  Healthcare maintenance -     POCT glycosylated hemoglobin (Hb A1C)  Essential hypertension -     Basic Metabolic Panel  Chronic anticoagulation -     CBC  Palpitations -     diltiazem (CARDIZEM CD) 120 MG 24 hr capsule; Take 1 capsule (120 mg total) by mouth daily.  Right hip pain -     traMADol (ULTRAM) 50 MG tablet; Take 1 tablet (50 mg total) by mouth every 6 (six) hours as needed for moderate pain or severe pain.   Meds ordered this encounter  Medications  . traMADol (ULTRAM) 50 MG tablet    Sig: Take 1 tablet (50 mg total) by mouth every 6 (six) hours as needed for moderate pain or severe pain.    Dispense:  60 tablet    Refill:  0  . diltiazem (CARDIZEM CD) 120 MG 24 hr capsule    Sig: Take 1 capsule (120 mg total) by mouth daily.    Dispense:  30 capsule    Refill:  3    Follow-up: No Follow-up on file.   Dessa Phi MD

## 2016-02-09 NOTE — Patient Instructions (Addendum)
Jeremy Herman was seen today for hypertension and hip pain.  Diagnoses and all orders for this visit:  Healthcare maintenance -     POCT glycosylated hemoglobin (Hb A1C)  Essential hypertension -     Basic Metabolic Panel  Chronic anticoagulation -     CBC  Palpitations -     diltiazem (CARDIZEM CD) 120 MG 24 hr capsule; Take 1 capsule (120 mg total) by mouth daily.  Right hip pain -     traMADol (ULTRAM) 50 MG tablet; Take 1 tablet (50 mg total) by mouth every 6 (six) hours as needed for moderate pain or severe pain.   F/u in 4 weeks for palpitations   Dr. Armen PickupFunches

## 2016-02-09 NOTE — Assessment & Plan Note (Signed)
A: chronic R anterior hip pain since fracture P: Refilled tramadol

## 2016-02-10 ENCOUNTER — Encounter: Payer: Self-pay | Admitting: Clinical

## 2016-02-10 NOTE — Progress Notes (Signed)
Depression screen PHQ 2/9 02/09/2016  Decreased Interest 1  Down, Depressed, Hopeless 0  PHQ - 2 Score 1  Altered sleeping 1  Tired, decreased energy 1  Change in appetite 0  Trouble concentrating 1  Suicidal thoughts 0  PHQ-9 Score 4    GAD 7 : Generalized Anxiety Score 02/09/2016  Nervous, Anxious, on Edge 1  Control/stop worrying 0  Worry too much - different things 0  Trouble relaxing 0  Restless 0  Easily annoyed or irritable 0  Afraid - awful might happen 0  Total GAD 7 Score 1

## 2016-02-16 ENCOUNTER — Ambulatory Visit: Payer: Medicare Other | Attending: Family Medicine | Admitting: Pharmacist

## 2016-02-16 DIAGNOSIS — I2699 Other pulmonary embolism without acute cor pulmonale: Secondary | ICD-10-CM

## 2016-02-16 LAB — POCT INR: INR: 1.5

## 2016-02-17 ENCOUNTER — Telehealth: Payer: Self-pay | Admitting: *Deleted

## 2016-02-17 NOTE — Telephone Encounter (Signed)
Pt. Returned call. Please f/u °

## 2016-02-17 NOTE — Telephone Encounter (Signed)
-----   Message from Dessa PhiJosalyn Funches, MD sent at 02/11/2016  8:56 AM EDT ----- Normal BMP and CBC

## 2016-02-17 NOTE — Telephone Encounter (Signed)
LVM to return call.

## 2016-02-18 NOTE — Telephone Encounter (Signed)
LVM to return call X2 

## 2016-02-18 NOTE — Telephone Encounter (Signed)
Pt returned call  Date of birth verified by pt  Normal lab results given  Pt verbalized understanding  Stated new medication is working fine  Will F/U with PCP next month

## 2016-02-23 ENCOUNTER — Ambulatory Visit: Payer: Medicare Other | Attending: Family Medicine | Admitting: Pharmacist

## 2016-02-23 DIAGNOSIS — I2699 Other pulmonary embolism without acute cor pulmonale: Secondary | ICD-10-CM

## 2016-02-23 LAB — POCT INR: INR: 2.1

## 2016-03-01 ENCOUNTER — Other Ambulatory Visit: Payer: Self-pay | Admitting: Pharmacist

## 2016-03-01 MED ORDER — WARFARIN SODIUM 2 MG PO TABS
ORAL_TABLET | ORAL | Status: DC
Start: 2016-03-01 — End: 2016-04-23

## 2016-03-01 MED FILL — WARFARIN SODIUM 2 MG TABLET: 2 | 30 days supply | Qty: 45 | Fill #0

## 2016-03-08 ENCOUNTER — Ambulatory Visit: Payer: Medicare Other | Attending: Family Medicine | Admitting: Pharmacist

## 2016-03-08 DIAGNOSIS — I2699 Other pulmonary embolism without acute cor pulmonale: Secondary | ICD-10-CM

## 2016-03-08 DIAGNOSIS — R002 Palpitations: Secondary | ICD-10-CM

## 2016-03-08 DIAGNOSIS — F17200 Nicotine dependence, unspecified, uncomplicated: Secondary | ICD-10-CM

## 2016-03-08 DIAGNOSIS — I82409 Acute embolism and thrombosis of unspecified deep veins of unspecified lower extremity: Secondary | ICD-10-CM

## 2016-03-08 LAB — POCT INR: INR: 4.3

## 2016-03-08 MED FILL — dilTIAZem CD 120 MG CP24: 120 | 30 days supply | Qty: 30 | Fill #1

## 2016-03-15 ENCOUNTER — Ambulatory Visit: Payer: Medicare Other | Attending: Family Medicine | Admitting: Pharmacist

## 2016-03-15 DIAGNOSIS — F17200 Nicotine dependence, unspecified, uncomplicated: Secondary | ICD-10-CM

## 2016-03-15 DIAGNOSIS — I2699 Other pulmonary embolism without acute cor pulmonale: Secondary | ICD-10-CM

## 2016-03-15 DIAGNOSIS — I82409 Acute embolism and thrombosis of unspecified deep veins of unspecified lower extremity: Secondary | ICD-10-CM

## 2016-03-15 LAB — POCT INR: INR: 3

## 2016-03-22 ENCOUNTER — Ambulatory Visit: Payer: Medicare Other | Attending: Family Medicine | Admitting: Pharmacist

## 2016-03-22 DIAGNOSIS — I2699 Other pulmonary embolism without acute cor pulmonale: Secondary | ICD-10-CM

## 2016-03-22 DIAGNOSIS — F17200 Nicotine dependence, unspecified, uncomplicated: Secondary | ICD-10-CM

## 2016-03-22 DIAGNOSIS — I82409 Acute embolism and thrombosis of unspecified deep veins of unspecified lower extremity: Secondary | ICD-10-CM

## 2016-03-22 LAB — POCT INR: INR: 3

## 2016-04-04 MED FILL — WARFARIN SODIUM 2 MG TABLET: 2 | 30 days supply | Qty: 45 | Fill #1

## 2016-04-05 ENCOUNTER — Ambulatory Visit: Payer: Medicare Other | Attending: Family Medicine | Admitting: Pharmacist

## 2016-04-05 DIAGNOSIS — I2699 Other pulmonary embolism without acute cor pulmonale: Secondary | ICD-10-CM

## 2016-04-05 DIAGNOSIS — F17200 Nicotine dependence, unspecified, uncomplicated: Secondary | ICD-10-CM

## 2016-04-05 DIAGNOSIS — I82409 Acute embolism and thrombosis of unspecified deep veins of unspecified lower extremity: Secondary | ICD-10-CM

## 2016-04-05 LAB — POCT INR: INR: 2.4

## 2016-04-08 MED FILL — dilTIAZem CD 120 MG CP24: 120 | 30 days supply | Qty: 30 | Fill #2

## 2016-04-19 ENCOUNTER — Inpatient Hospital Stay (HOSPITAL_COMMUNITY)
Admission: EM | Admit: 2016-04-19 | Discharge: 2016-04-23 | DRG: 482 | Disposition: A | Discharge status: Skilled Nursing Facility | Payer: Medicare Other | Attending: Family Medicine | Admitting: Family Medicine

## 2016-04-19 ENCOUNTER — Emergency Department (HOSPITAL_COMMUNITY): Payer: Medicare Other

## 2016-04-19 ENCOUNTER — Encounter (HOSPITAL_COMMUNITY): Payer: Self-pay | Admitting: *Deleted

## 2016-04-19 DIAGNOSIS — Z86718 Personal history of other venous thrombosis and embolism: Secondary | ICD-10-CM | POA: Diagnosis not present

## 2016-04-19 DIAGNOSIS — I2699 Other pulmonary embolism without acute cor pulmonale: Secondary | ICD-10-CM | POA: Diagnosis present

## 2016-04-19 DIAGNOSIS — R000 Tachycardia, unspecified: Secondary | ICD-10-CM | POA: Diagnosis not present

## 2016-04-19 DIAGNOSIS — Z09 Encounter for follow-up examination after completed treatment for conditions other than malignant neoplasm: Secondary | ICD-10-CM

## 2016-04-19 DIAGNOSIS — Z419 Encounter for procedure for purposes other than remedying health state, unspecified: Secondary | ICD-10-CM

## 2016-04-19 DIAGNOSIS — Y92008 Other place in unspecified non-institutional (private) residence as the place of occurrence of the external cause: Secondary | ICD-10-CM

## 2016-04-19 DIAGNOSIS — F17210 Nicotine dependence, cigarettes, uncomplicated: Secondary | ICD-10-CM | POA: Diagnosis not present

## 2016-04-19 DIAGNOSIS — I10 Essential (primary) hypertension: Secondary | ICD-10-CM | POA: Diagnosis not present

## 2016-04-19 DIAGNOSIS — Z86711 Personal history of pulmonary embolism: Secondary | ICD-10-CM | POA: Diagnosis not present

## 2016-04-19 DIAGNOSIS — S7290XA Unspecified fracture of unspecified femur, initial encounter for closed fracture: Secondary | ICD-10-CM | POA: Diagnosis present

## 2016-04-19 DIAGNOSIS — I4891 Unspecified atrial fibrillation: Secondary | ICD-10-CM | POA: Diagnosis present

## 2016-04-19 DIAGNOSIS — Z7901 Long term (current) use of anticoagulants: Secondary | ICD-10-CM

## 2016-04-19 DIAGNOSIS — S72142A Displaced intertrochanteric fracture of left femur, initial encounter for closed fracture: Principal | ICD-10-CM | POA: Diagnosis present

## 2016-04-19 DIAGNOSIS — R002 Palpitations: Secondary | ICD-10-CM | POA: Diagnosis present

## 2016-04-19 DIAGNOSIS — I82403 Acute embolism and thrombosis of unspecified deep veins of lower extremity, bilateral: Secondary | ICD-10-CM | POA: Diagnosis present

## 2016-04-19 DIAGNOSIS — I739 Peripheral vascular disease, unspecified: Secondary | ICD-10-CM | POA: Diagnosis not present

## 2016-04-19 DIAGNOSIS — S7292XA Unspecified fracture of left femur, initial encounter for closed fracture: Secondary | ICD-10-CM | POA: Diagnosis present

## 2016-04-19 DIAGNOSIS — S51812A Laceration without foreign body of left forearm, initial encounter: Secondary | ICD-10-CM | POA: Diagnosis present

## 2016-04-19 DIAGNOSIS — J449 Chronic obstructive pulmonary disease, unspecified: Secondary | ICD-10-CM | POA: Diagnosis present

## 2016-04-19 DIAGNOSIS — W010XXA Fall on same level from slipping, tripping and stumbling without subsequent striking against object, initial encounter: Secondary | ICD-10-CM | POA: Diagnosis not present

## 2016-04-19 DIAGNOSIS — M25552 Pain in left hip: Secondary | ICD-10-CM | POA: Diagnosis present

## 2016-04-19 DIAGNOSIS — F17200 Nicotine dependence, unspecified, uncomplicated: Secondary | ICD-10-CM | POA: Diagnosis present

## 2016-04-19 DIAGNOSIS — W19XXXA Unspecified fall, initial encounter: Secondary | ICD-10-CM

## 2016-04-19 MED ORDER — FENTANYL CITRATE (PF) 100 MCG/2ML IJ SOLN
50.0000 ug | Freq: Once | INTRAMUSCULAR | Status: AC
  Administered 2016-04-20: 50 ug via INTRAVENOUS
  Filled 2016-04-19: qty 2

## 2016-04-19 MED ORDER — SODIUM CHLORIDE 0.9 % IV SOLN
INTRAVENOUS | Status: DC
  Administered 2016-04-20 – 2016-04-22 (×7): via INTRAVENOUS

## 2016-04-19 MED ORDER — TETANUS-DIPHTH-ACELL PERTUSSIS 5-2.5-18.5 LF-MCG/0.5 IM SUSP
0.5000 mL | Freq: Once | INTRAMUSCULAR | Status: AC
  Administered 2016-04-19: 0.5 mL via INTRAMUSCULAR
  Filled 2016-04-19: qty 0.5

## 2016-04-19 NOTE — ED Notes (Signed)
Pt with unwitness fall. Pt denies LOC and states that he tripped over furniture. Pt with complains of pain to left groin and EMS noted skin tear to LUE. Pt is on Warfarin.

## 2016-04-19 NOTE — ED Notes (Signed)
RN will draw blood work when starting a IV line

## 2016-04-19 NOTE — ED Provider Notes (Signed)
CSN: [id number]     Arrival date & time 04/19/16  2112 History   First MD Initiated Contact with Patient 04/19/16 2152     Chief Complaint  Patient presents with  . Groin Pain     (Consider location/radiation/quality/duration/timing/severity/associated sxs/prior Treatment) HPI   Patient is a 70 year old male past medical history of DVT, PE and A. fib on Coumadin who presents the ED via EMS status post fall, onset prior to arrival. Patient reports he tripped and stumbled over a coffee table resulting in him falling and landing on his left thigh/hip. Denies head injury or LOC. Patient reports having pain to his left groin and left upper thigh. He notes pain is worse with movement. Denies headache, visual changes, numbness, tingling, weakness. Patient reports having multiple skin tears to his left forearm as a result of the fall. He notes he is on Coumadin, patient states his last dose was last night and notes he has not taken his dose tonight.  Past Medical History  Diagnosis Date  . Medical history non-contributory   . DVT (deep venous thrombosis) (HCC)   . Arthritis   . PE (pulmonary embolism)   . Chronic anticoagulation    Past Surgical History  Procedure Laterality Date  . Ankle surgery  1979    right ankle   Family History  Problem Relation Age of Onset  . Stomach cancer Father   . Aneurysm    . Cancer Mother    Social History  Substance Use Topics  . Smoking status: Current Every Day Smoker -- 1.00 packs/day for 43 years    Types: Cigarettes  . Smokeless tobacco: Never Used  . Alcohol Use: Yes     Comment: glass of wine a day    Review of Systems  Musculoskeletal: Positive for arthralgias (left thigh/hip).  Skin: Positive for wound (abrasion).  All other systems reviewed and are negative.     Allergies  Fish allergy  Home Medications   Prior to Admission medications   Medication Sig Start Date End Date Taking? Authorizing Provider  acetaminophen (TYLENOL)  500 MG tablet Take 1,000 mg by mouth every 6 (six) hours as needed for moderate pain or headache.   Yes Historical Provider, MD  diltiazem (CARDIZEM CD) 120 MG 24 hr capsule Take 1 capsule (120 mg total) by mouth daily. 02/09/16  Yes Josalyn Funches, MD  traMADol (ULTRAM) 50 MG tablet Take 1 tablet (50 mg total) by mouth every 6 (six) hours as needed for moderate pain or severe pain. 02/09/16  Yes Dessa Phi, MD  warfarin (COUMADIN) 2 MG tablet Take 1 tablet every day but 2 tablets on Tuesdays, Thursdays, and Saturdays Patient taking differently: Take 2-4 mg by mouth every evening. Take 1 tablet every day but 2 tablets on Thursdays, and Saturdays 03/01/16  Yes Olugbemiga E Jegede, MD   BP 132/86 mmHg  Pulse 101  Temp(Src) 98.7 F (37.1 C) (Oral)  Resp 17  SpO2 96% Physical Exam  Constitutional: He is oriented to person, place, and time. He appears well-developed and well-nourished. No distress.  HENT:  Head: Normocephalic and atraumatic. Head is without raccoon's eyes, without Battle's sign, without abrasion, without contusion and without laceration.  Right Ear: Tympanic membrane normal. No hemotympanum.  Left Ear: Tympanic membrane normal. No hemotympanum.  Nose: Nose normal.  Mouth/Throat: Uvula is midline, oropharynx is clear and moist and mucous membranes are normal. No oropharyngeal exudate.  Eyes: Conjunctivae and EOM are normal. Right eye exhibits no discharge. Left eye  exhibits no discharge. No scleral icterus.  Neck: Normal range of motion. Neck supple.  Cardiovascular: Normal rate, regular rhythm, normal heart sounds and intact distal pulses.   Pulmonary/Chest: Effort normal and breath sounds normal.  Abdominal: Soft. Bowel sounds are normal. There is no tenderness.  Musculoskeletal: He exhibits no edema.  No midline C, T, or L tenderness. Full range of motion of neck and back. Full range of motion of bilateral upper with 5/5 strength. FROM of left knee, ankle and foot. Dec ROM  of left hip due to reported pain. TTP over left proximal femur and groin. No swelling, abrasion, contusion or laceration noted. Sensation intact. 2+ radial and DP pulses. Cap refill <2 seconds.   Lymphadenopathy:    He has no cervical adenopathy.  Neurological: He is alert and oriented to person, place, and time.  Skin: Skin is warm and dry. He is not diaphoretic.  Multiple skin tears noted to left forearm, no active bleeding.   Nursing note and vitals reviewed.   ED Course  Procedures (including critical care time) Labs Review Labs Reviewed  BASIC METABOLIC PANEL - Abnormal; Notable for the following:    Creatinine, Ser 0.45 (*)    Calcium 8.6 (*)    All other components within normal limits  CBC WITH DIFFERENTIAL/PLATELET - Abnormal; Notable for the following:    RBC 3.73 (*)    MCV 113.7 (*)    MCH 39.9 (*)    Neutro Abs 8.5 (*)    All other components within normal limits  PROTIME-INR - Abnormal; Notable for the following:    Prothrombin Time 29.1 (*)    INR 2.81 (*)    All other components within normal limits    Imaging Review Dg Hip Unilat With Pelvis 2-3 Views Left  04/19/2016  CLINICAL DATA:  Fall with left groin and proximal left upper extremity pain. EXAM: LEFT FEMUR 1 VIEW; DG HIP (WITH OR WITHOUT PELVIS) 2-3V LEFT COMPARISON:  10/22/2015 pelvic radiograph. FINDINGS: There is a nondisplaced comminuted intertrochanteric left proximal femur fracture. No left hip dislocation. No additional fracture visualized in the pelvis or distal left femur. No suspicious focal osseous lesion. Degenerative changes in the visualized lower lumbar spine. 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 No pelvic diastasis. Bulky enthesophytes at the superior greater trochanters bilaterally. IMPRESSION: Nondisplaced comminuted intertrochanteric left proximal femur fracture. The fracture lucencies are obscured by overlying skin folds, and left hip CT may be useful for  further characterization. Electronically Signed   By: Delbert PhenixJason A Poff M.D.   On: 04/19/2016 23:29   Dg Femur 1v Left  04/19/2016  CLINICAL DATA:  Fall with left groin and proximal left upper extremity pain. EXAM: LEFT FEMUR 1 VIEW; DG HIP (WITH OR WITHOUT PELVIS) 2-3V LEFT COMPARISON:  10/22/2015 pelvic radiograph. FINDINGS: There is a nondisplaced comminuted intertrochanteric left proximal femur fracture. No left hip dislocation. No additional fracture visualized in the pelvis or distal left femur. No suspicious focal osseous lesion. Degenerative changes in the visualized lower lumbar spine. 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 No pelvic diastasis. Bulky enthesophytes at the superior greater trochanters bilaterally. IMPRESSION: Nondisplaced comminuted intertrochanteric left proximal femur fracture. The fracture lucencies are obscured by overlying skin folds, and left hip CT may be useful for further characterization. Electronically Signed   By: Delbert PhenixJason A Poff M.D.   On: 04/19/2016 23:29   I have personally reviewed and evaluated these images and lab results as part of my  medical decision-making.   EKG Interpretation None      MDM   Final diagnoses:  Closed fracture of left femur, unspecified fracture morphology, initial encounter [facility])   Patient presents status post fall with left upper thigh and groin pain. Denies head injury or LOC. Patient reports being on Coumadin for history of DVT/PE. VSS. Exam revealed tenderness over left proximal tubal and groin, decreased range of motion of left hip due to reported pain. Multiple skin tears noted to left forearm with no active bleeding. No evidence of head injury or trauma, no other evidence of injury. Remaining exam unremarkable. No neuro deficits. Patient denies having any pain while sitting still in the bed, declined pain meds. Femur and left hip x-ray revealed nondisplaced comminuted intertrochanteric left proximal femur  fracture. Consulted ortho, Dr. Linna Caprice advised to have pt admitted. INR 2.8. Remaining labs unremarkable. Patient reports worsening left hip pain. Patient given dose of fentanyl in the ED.Consult at hospitalist. Dr. Henrietta Hoover agrees to admission. Orders placed for MedSurg bed. Discussed results and plan for admission with patient.     Satira Sark [place], [place] 04/20/16 0125  Mancel Bale, MD 04/20/16 [phone]

## 2016-04-19 NOTE — ED Provider Notes (Signed)
  Face-to-face evaluation   History: Patient tripped on a coffee table, and fell. He injured his left hip. He has been undergoing rehabilitation, for a right hip fracture which was treated symptomatically only. He has recovered the ability to walk, but gets tired when he walks over 100 yards. He uses a walker when he walks a long distance. Injured his left arm in the fall today, but is able to move it without pain.  Physical exam: Alert, calm, cooperative. Left hip tender to palpation and with gentle range of motion. Right hip motion normal. Normal range of motion arms. No head injury.  Medical screening examination/treatment/procedure(s) were conducted as a shared visit with non-physician practitioner(s) and myself.  I personally evaluated the patient during the encounter  Mancel BaleElliott Falen Lehrmann, MD 04/20/16 1228

## 2016-04-19 NOTE — ED Notes (Signed)
Bed: [id number] Expected date:  Expected time:  Means of arrival:  Comments: EMS 70 yo male right groin pain after fall 45 minutes ago

## 2016-04-20 ENCOUNTER — Inpatient Hospital Stay (HOSPITAL_COMMUNITY): Payer: Medicare Other

## 2016-04-20 DIAGNOSIS — R000 Tachycardia, unspecified: Secondary | ICD-10-CM | POA: Diagnosis present

## 2016-04-20 DIAGNOSIS — I10 Essential (primary) hypertension: Secondary | ICD-10-CM | POA: Diagnosis present

## 2016-04-20 DIAGNOSIS — I824Y3 Acute embolism and thrombosis of unspecified deep veins of proximal lower extremity, bilateral: Secondary | ICD-10-CM | POA: Diagnosis not present

## 2016-04-20 DIAGNOSIS — I739 Peripheral vascular disease, unspecified: Secondary | ICD-10-CM | POA: Diagnosis present

## 2016-04-20 DIAGNOSIS — J449 Chronic obstructive pulmonary disease, unspecified: Secondary | ICD-10-CM | POA: Diagnosis present

## 2016-04-20 DIAGNOSIS — S72145A Nondisplaced intertrochanteric fracture of left femur, initial encounter for closed fracture: Secondary | ICD-10-CM | POA: Diagnosis not present

## 2016-04-20 DIAGNOSIS — I4891 Unspecified atrial fibrillation: Secondary | ICD-10-CM | POA: Diagnosis present

## 2016-04-20 DIAGNOSIS — S7290XA Unspecified fracture of unspecified femur, initial encounter for closed fracture: Secondary | ICD-10-CM | POA: Diagnosis present

## 2016-04-20 DIAGNOSIS — Z7901 Long term (current) use of anticoagulants: Secondary | ICD-10-CM

## 2016-04-20 DIAGNOSIS — R002 Palpitations: Secondary | ICD-10-CM | POA: Diagnosis present

## 2016-04-20 DIAGNOSIS — S51812A Laceration without foreign body of left forearm, initial encounter: Secondary | ICD-10-CM | POA: Diagnosis present

## 2016-04-20 DIAGNOSIS — S7292XA Unspecified fracture of left femur, initial encounter for closed fracture: Secondary | ICD-10-CM | POA: Diagnosis present

## 2016-04-20 DIAGNOSIS — Z720 Tobacco use: Secondary | ICD-10-CM | POA: Diagnosis not present

## 2016-04-20 DIAGNOSIS — Y92008 Other place in unspecified non-institutional (private) residence as the place of occurrence of the external cause: Secondary | ICD-10-CM | POA: Diagnosis not present

## 2016-04-20 DIAGNOSIS — S72142D Displaced intertrochanteric fracture of left femur, subsequent encounter for closed fracture with routine healing: Secondary | ICD-10-CM | POA: Diagnosis not present

## 2016-04-20 DIAGNOSIS — W010XXA Fall on same level from slipping, tripping and stumbling without subsequent striking against object, initial encounter: Secondary | ICD-10-CM | POA: Diagnosis present

## 2016-04-20 DIAGNOSIS — I2699 Other pulmonary embolism without acute cor pulmonale: Secondary | ICD-10-CM | POA: Diagnosis not present

## 2016-04-20 DIAGNOSIS — Z86711 Personal history of pulmonary embolism: Secondary | ICD-10-CM | POA: Diagnosis not present

## 2016-04-20 DIAGNOSIS — S72142A Displaced intertrochanteric fracture of left femur, initial encounter for closed fracture: Secondary | ICD-10-CM | POA: Diagnosis present

## 2016-04-20 DIAGNOSIS — M25552 Pain in left hip: Secondary | ICD-10-CM | POA: Diagnosis present

## 2016-04-20 DIAGNOSIS — F17210 Nicotine dependence, cigarettes, uncomplicated: Secondary | ICD-10-CM | POA: Diagnosis present

## 2016-04-20 DIAGNOSIS — Z86718 Personal history of other venous thrombosis and embolism: Secondary | ICD-10-CM | POA: Diagnosis not present

## 2016-04-20 LAB — CBC WITH DIFFERENTIAL/PLATELET
Basophils Absolute: 0 10*3/uL (ref 0.0–0.1)
Basophils Relative: 0 %
EOS ABS: 0 10*3/uL (ref 0.0–0.7)
Eosinophils Relative: 0 %
HCT: 42.4 % (ref 39.0–52.0)
Hemoglobin: 14.9 g/dL (ref 13.0–17.0)
LYMPHS ABS: 1.5 10*3/uL (ref 0.7–4.0)
Lymphocytes Relative: 14 %
MCH: 39.9 pg — ABNORMAL HIGH (ref 26.0–34.0)
MCHC: 35.1 g/dL (ref 30.0–36.0)
MCV: 113.7 fL — AB (ref 78.0–100.0)
MONO ABS: 0.5 10*3/uL (ref 0.1–1.0)
Monocytes Relative: 5 %
Neutro Abs: 8.5 10*3/uL — ABNORMAL HIGH (ref 1.7–7.7)
Neutrophils Relative %: 81 %
PLATELETS: 167 10*3/uL (ref 150–400)
RBC: 3.73 MIL/uL — AB (ref 4.22–5.81)
RDW: 13.9 % (ref 11.5–15.5)
WBC: 10.5 10*3/uL (ref 4.0–10.5)

## 2016-04-20 LAB — URINALYSIS, ROUTINE W REFLEX MICROSCOPIC
Bilirubin Urine: NEGATIVE
GLUCOSE, UA: NEGATIVE mg/dL
HGB URINE DIPSTICK: NEGATIVE
Ketones, ur: NEGATIVE mg/dL
Leukocytes, UA: NEGATIVE
Nitrite: NEGATIVE
PROTEIN: NEGATIVE mg/dL
Specific Gravity, Urine: 1.015 (ref 1.005–1.030)
pH: 6 (ref 5.0–8.0)

## 2016-04-20 LAB — PROTIME-INR
INR: 2.75 — ABNORMAL HIGH (ref 0.00–1.49)
INR: 2.81 — ABNORMAL HIGH (ref 0.00–1.49)
Prothrombin Time: 28.6 seconds — ABNORMAL HIGH (ref 11.6–15.2)
Prothrombin Time: 29.1 seconds — ABNORMAL HIGH (ref 11.6–15.2)

## 2016-04-20 LAB — CBC
HEMATOCRIT: 38.1 % — AB (ref 39.0–52.0)
Hemoglobin: 13.1 g/dL (ref 13.0–17.0)
MCH: 39.6 pg — ABNORMAL HIGH (ref 26.0–34.0)
MCHC: 34.4 g/dL (ref 30.0–36.0)
MCV: 115.1 fL — ABNORMAL HIGH (ref 78.0–100.0)
Platelets: 156 10*3/uL (ref 150–400)
RBC: 3.31 MIL/uL — ABNORMAL LOW (ref 4.22–5.81)
RDW: 14.1 % (ref 11.5–15.5)
WBC: 10.9 10*3/uL — ABNORMAL HIGH (ref 4.0–10.5)

## 2016-04-20 LAB — TYPE AND SCREEN
ABO/RH(D): O POS
ANTIBODY SCREEN: NEGATIVE

## 2016-04-20 LAB — BASIC METABOLIC PANEL
Anion gap: 12 (ref 5–15)
BUN: 8 mg/dL (ref 6–20)
CALCIUM: 8.6 mg/dL — AB (ref 8.9–10.3)
CO2: 22 mmol/L (ref 22–32)
CREATININE: 0.45 mg/dL — AB (ref 0.61–1.24)
Chloride: 104 mmol/L (ref 101–111)
GFR calc Af Amer: >60 mL/min (ref >60)
GFR calc non Af Amer: >60 mL/min (ref >60)
Glucose, Bld: 95 mg/dL (ref 65–99)
Potassium: 4.2 mmol/L (ref 3.5–5.1)
SODIUM: 138 mmol/L (ref 135–145)

## 2016-04-20 MED ORDER — ONDANSETRON HCL 4 MG/2ML IJ SOLN
4.0000 mg | INTRAMUSCULAR | Status: DC | PRN
  Administered 2016-04-20 – 2016-04-21 (×3): 4 mg via INTRAVENOUS
  Filled 2016-04-20 (×2): qty 2

## 2016-04-20 MED ORDER — DILTIAZEM HCL ER COATED BEADS 120 MG PO CP24
120.0000 mg | ORAL_CAPSULE | Freq: Every day | ORAL | Status: DC
  Administered 2016-04-20 – 2016-04-23 (×3): 120 mg via ORAL
  Filled 2016-04-20 (×4): qty 1

## 2016-04-20 MED ORDER — TRAMADOL HCL 50 MG PO TABS
50.0000 mg | ORAL_TABLET | Freq: Four times a day (QID) | ORAL | Status: DC | PRN
  Administered 2016-04-20: 50 mg via ORAL
  Filled 2016-04-20: qty 1

## 2016-04-20 MED ORDER — DOCUSATE SODIUM 100 MG PO CAPS
100.0000 mg | ORAL_CAPSULE | Freq: Two times a day (BID) | ORAL | Status: DC
  Filled 2016-04-20 (×2): qty 1

## 2016-04-20 MED ORDER — PHYTONADIONE 5 MG PO TABS
10.0000 mg | ORAL_TABLET | Freq: Once | ORAL | Status: AC
Start: 2016-04-20 — End: 2016-04-20
  Administered 2016-04-20: 10 mg via ORAL
  Filled 2016-04-20: qty 2

## 2016-04-20 MED ORDER — DIAZEPAM 5 MG PO TABS: 5.0000 mg | ORAL_TABLET | Freq: Two times a day (BID) | ORAL | Status: DC | PRN

## 2016-04-20 MED ORDER — HYDROMORPHONE HCL 1 MG/ML IJ SOLN
1.0000 mg | Freq: Once | INTRAMUSCULAR | Status: AC
  Administered 2016-04-20: 1 mg via INTRAVENOUS
  Filled 2016-04-20: qty 1

## 2016-04-20 MED ORDER — HYDROMORPHONE HCL 1 MG/ML IJ SOLN
0.5000 mg | INTRAMUSCULAR | Status: DC | PRN
  Administered 2016-04-20 – 2016-04-21 (×7): 0.5 mg via INTRAVENOUS
  Filled 2016-04-20 (×7): qty 1

## 2016-04-20 MED ORDER — METOCLOPRAMIDE HCL 5 MG/ML IJ SOLN: 5.0000 mg | Freq: Once | INTRAMUSCULAR | Status: DC

## 2016-04-20 MED ORDER — DIAZEPAM 5 MG PO TABS
5.0000 mg | ORAL_TABLET | Freq: Once | ORAL | Status: AC
  Administered 2016-04-20: 5 mg via ORAL
  Filled 2016-04-20: qty 1

## 2016-04-20 MED ORDER — DIPHENHYDRAMINE HCL 50 MG/ML IJ SOLN: 12.5000 mg | Freq: Once | INTRAMUSCULAR | Status: DC

## 2016-04-20 NOTE — ED Notes (Signed)
This RN Confirmed with MD, Montez Moritaarter to give dilaudid IV now.

## 2016-04-20 NOTE — Progress Notes (Signed)
Utilization review completed.  Waiting for INR to drop to safe level for surgery

## 2016-04-20 NOTE — ED Notes (Signed)
Obtaining EKG prior to floor.

## 2016-04-20 NOTE — Consult Note (Signed)
ORTHOPAEDIC CONSULTATION  REQUESTING PHYSICIAN: Shon Haleourage Emokpae, MD  PCP:  Lora PaulaFUNCHES, JOSALYN C, MD  Chief Complaint: Left hip pain  HPI: Jeremy Herman is a 70 y.o. male who complains of  left hip pain after tripping on the leg of a coffee table last night. He landed on his left hip. He had left hip pain and inability to weight-bear. He came [facility], where x-rays revealed a nondisplaced left intertrochanteric femur fracture. Orthopedic consultation was obtained. He was admitted to the hospitalist service. 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 INR in the emergency department was 2.8. The patient is actually known to me for previous right greater trochanter avulsion fracture in November 2016. He denies other injuries.  Past Medical History  Diagnosis Date  . Medical history non-contributory   . DVT (deep venous thrombosis) (HCC)   . Arthritis   . PE (pulmonary embolism)   . Chronic anticoagulation    Past Surgical History  Procedure Laterality Date  . Ankle surgery  1979    right ankle   Social History   Social History  . Marital Status: Married    Spouse Name: N/A  . Number of Children: N/A  . Years of Education: N/A   Social History Main Topics  . Smoking status: Current Every Day Smoker -- 1.00 packs/day for 43 years    Types: Cigarettes  . Smokeless tobacco: Never Used  . Alcohol Use: Yes     Comment: glass of wine a day  . Drug Use: No  . Sexual Activity: Not Currently   Other Topics Concern  . None   Social History Narrative   Family History  Problem Relation Age of Onset  . Stomach cancer Father   . Aneurysm    . Cancer Mother    Allergies  Allergen Reactions  . Fish Allergy Anaphylaxis and Other (See Comments)    Diarrhea    Prior to Admission medications   Medication Sig Start Date End Date Taking? Authorizing Provider  acetaminophen (TYLENOL) 500 MG tablet Take  1,000 mg by mouth every 6 (six) hours as needed for moderate pain or headache.   Yes Historical Provider, MD  diltiazem (CARDIZEM CD) 120 MG 24 hr capsule Take 1 capsule (120 mg total) by mouth daily. 02/09/16  Yes Josalyn Funches, MD  traMADol (ULTRAM) 50 MG tablet Take 1 tablet (50 mg total) by mouth every 6 (six) hours as needed for moderate pain or severe pain. 02/09/16  Yes Dessa PhiJosalyn Funches, MD  warfarin (COUMADIN) 2 MG tablet Take 1 tablet every day but 2 tablets on Tuesdays, Thursdays, and Saturdays Patient taking differently: Take 2-4 mg by mouth every evening. Take 1 tablet every day but 2 tablets on Thursdays, and Saturdays 03/01/16  Yes Quentin Angstlugbemiga E Jegede, MD   Chest Portable 1 View  04/20/2016  CLINICAL DATA:  Hip fracture.  Preoperative chest x-ray. EXAM: PORTABLE CHEST 1 VIEW COMPARISON:  10/24/2015 . FINDINGS: Mediastinum hilar structures normal. Borderline cardiomegaly with normal pulmonary vascularity. No focal infiltrate. No pleural effusion or pneumothorax. IMPRESSION: 1. Borderline cardiomegaly.  No pulmonary venous congestion. 2. No acute pulmonary disease. Electronically Signed   By: Maisie Fushomas  Register   On: 04/20/2016 07:00   Dg Hip Unilat With Pelvis 2-3 Views Left  04/19/2016  CLINICAL DATA:  Fall with left groin and proximal left upper extremity pain. EXAM: LEFT FEMUR 1 VIEW; DG HIP (WITH OR WITHOUT PELVIS) 2-3V LEFT COMPARISON:  10/22/2015 pelvic radiograph. FINDINGS: There is a nondisplaced comminuted intertrochanteric left proximal femur fracture. No left hip dislocation. No additional fracture visualized in the pelvis or distal left femur. No suspicious focal osseous lesion. Degenerative changes in the visualized lower lumbar spine. 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 No pelvic diastasis. Bulky enthesophytes at the superior greater trochanters bilaterally. IMPRESSION: Nondisplaced comminuted intertrochanteric left proximal femur fracture. The  fracture lucencies are obscured by overlying skin folds, and left hip CT may be useful for further characterization. Electronically Signed   By: Delbert Phenix M.D.   On: 04/19/2016 23:29   Dg Femur 1v Left  04/19/2016  CLINICAL DATA:  Fall with left groin and proximal left upper extremity pain. EXAM: LEFT FEMUR 1 VIEW; DG HIP (WITH OR WITHOUT PELVIS) 2-3V LEFT COMPARISON:  10/22/2015 pelvic radiograph. FINDINGS: There is a nondisplaced comminuted intertrochanteric left proximal femur fracture. No left hip dislocation. No additional fracture visualized in the pelvis or distal left femur. No suspicious focal osseous lesion. Degenerative changes in the visualized lower lumbar spine. 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 No pelvic diastasis. Bulky enthesophytes at the superior greater trochanters bilaterally. IMPRESSION: Nondisplaced comminuted intertrochanteric left proximal femur fracture. The fracture lucencies are obscured by overlying skin folds, and left hip CT may be useful for further characterization. Electronically Signed   By: Delbert Phenix M.D.   On: 04/19/2016 23:29    Positive ROS: All other systems have been reviewed and were otherwise negative with the exception of those mentioned in the HPI and as above.  Physical Exam: General: Alert, no acute distress Cardiovascular: No pedal edema Respiratory: No cyanosis, no use of accessory musculature GI: No organomegaly, abdomen is soft and non-tender Skin: No lesions in the area of chief complaint Neurologic: Sensation intact distally Psychiatric: Patient is competent for consent with normal mood and affect Lymphatic: No axillary or cervical lymphadenopathy  MUSCULOSKELETAL: Left lower extremity: Skin is intact. He has pain with logrolling of the hip. He has positive motor function dorsiflexion, plantar flexion, and great toe extension. He has palpable pedal pulses. He reports intact sensation to light  touch.  Assessment: Left intertrochanteric femur fracture Chronic Coumadin use with elevated INR Tobacco abuse  Plan: I discussed the findings with the patient. Given the nature of his fracture, I would recommend intramedullary fixation of his left femur. Currently, his INR is too elevated. I would recommend that we hold his chemical DVT prophylaxis. I will give him 10 mg of by mouth vitamin K this morning. He may have a diet today, and we will make him nothing by mouth after midnight tonight. If his INR drops sufficiently, we can proceed with surgery tomorrow. We did discuss the risks, benefits, and alternatives. He is going to need skilled nursing placement postoperatively as he has poor social support. We discussed the increased risk of fracture healing and wound healing complications including infection due to his tobacco abuse. Smoking cessation recommended. Following.  The risks, benefits, and alternatives were discussed with the patient. There are risks associated with the surgery including, but not limited to, problems with anesthesia (death), infection, differences in leg length/angulation/rotation, fracture of bones, loosening or failure of implants, malunion, nonunion, hematoma (blood accumulation) which may require surgical drainage, blood clots, pulmonary embolism, nerve injury (foot drop), and blood vessel injury. The patient understands these risks and elects to proceed.     Taten Merrow, Cloyde Reams, MD Cell [phone]    04/20/2016 7:09  AM

## 2016-04-20 NOTE — ED Notes (Signed)
Report given to floor, patient ready for transfer.

## 2016-04-20 NOTE — ED Notes (Signed)
Attempted report x1.  RN performing ADL with patient on floor.  6E RN will call for report.

## 2016-04-20 NOTE — H&P (Signed)
History and Physical    Jeremy Herman ZOX:[id number] DOB: 10-Sep-1946 DOA: 04/19/2016  PCP: Lora Paula, MD   Patient coming from: Home  Chief Complaint: Left groin pain after accidental fall  HPI: Jeremy Herman is a 70 y.o. gentleman with a history of recurrent DVT/PE now anticoagulated for life with warfarin, palpitations and tachycardia (he denies specific diagnosis of atrial fibrillation), and active tobacco use who feels that he was in his baseline state of health until he had an accidental fall in his home today; resulting in a nondisplaced comminuted nondisplaced proximal femur fracture.  The patient reports that he tripped on the leg of his coffee table and slid across the floor.  No preceding chest pain, shortness of breath, or light-headedness.  No syncope.  No palpitations.  No headache or aura.  No chest pain or shortness of breath.  No fever.  No recent antibiotics.  No LUTS.  No spontaneous bleeding.  No leg swelling.  No history of heart disease, MI, CVA, or CHF.  Of note, the patient had a right hip fracture in November 2016.  Nonoperative management was recommended at that time.  ED Course: Xray shows left femur fracture.  Orthopedic surgery consulted from the ED.  Hospitalist asked to admit.  Review of Systems: As per HPI otherwise 10 point review of systems negative.   Past Medical History  Diagnosis Date  . DVT (deep venous thrombosis) (HCC)   . Arthritis   . PE (pulmonary embolism)   . Chronic anticoagulation   Tachyarrhythmia  Past Surgical History  Procedure Laterality Date  . Ankle surgery  1979    right ankle  Related to skiing accident   reports that he has been smoking Cigarettes.  He has a 43 pack-year smoking history. He has never used smokeless tobacco. He reports that he drinks alcohol. He reports that he does not use illicit drugs.  Still smoking 1 ppd.  No EtOH or illicit drug use.  He is divorced.  He does not have any children.  Allergies    Allergen Reactions  . Fish Allergy Anaphylaxis and Other (See Comments)    Diarrhea   NKDA  Family History  Problem Relation Age of Onset  . Stomach cancer Father   . Aneurysm    . Cancer Mother   Denies any known family history of CAD or CVA.  Prior to Admission medications   Medication Sig Start Date End Date Taking? Authorizing Provider  acetaminophen (TYLENOL) 500 MG tablet Take 1,000 mg by mouth every 6 (six) hours as needed for moderate pain or headache.   Yes Historical Provider, MD  diltiazem (CARDIZEM CD) 120 MG 24 hr capsule Take 1 capsule (120 mg total) by mouth daily. 02/09/16  Yes Josalyn Funches, MD  traMADol (ULTRAM) 50 MG tablet Take 1 tablet (50 mg total) by mouth every 6 (six) hours as needed for moderate pain or severe pain. 02/09/16  Yes Dessa Phi, MD  warfarin (COUMADIN) 2 MG tablet Take 1 tablet every day but 2 tablets on Tuesdays, Thursdays, and Saturdays Patient taking differently: Take 2-4 mg by mouth every evening. Take 1 tablet every day but 2 tablets on Thursdays, and Saturdays 03/01/16  Yes Quentin Angst, MD    Physical Exam: Filed Vitals:   04/19/16 2127 04/20/16 0016 04/20/16 0140  BP: 121/82 132/86 134/81  Pulse: 97 101 112  Temp: 98.5 F (36.9 C) 98.7 F (37.1 C)   TempSrc: Oral Oral   Resp: 18 17 19  SpO2: 97% 96% 95%      Constitutional: NAD, calm, comfortable expect when having left leg cramps/intermittent pain Filed Vitals:   04/19/16 2127 04/20/16 0016 04/20/16 0140  BP: 121/82 132/86 134/81  Pulse: 97 101 112  Temp: 98.5 F (36.9 C) 98.7 F (37.1 C)   TempSrc: Oral Oral   Resp: 18 17 19   SpO2: 97% 96% 95%   Eyes: PERRL, lids and conjunctivae normal ENMT: Mucous membranes are moist. Posterior pharynx clear of any exudate or lesions.Normal dentition.  Neck: normal, supple Respiratory: clear to auscultation bilaterally, no wheezing, no crackles. Normal respiratory effort. No accessory muscle use.  Cardiovascular:  Regular rate and rhythm, no murmurs / rubs / gallops. No extremity edema. 2+ pedal pulses.  Abdomen: no tenderness, no masses palpated.  Bowel sounds positive.  Musculoskeletal: ROM in left leg limited by pain.  Otherwise, move all extremities spontaneously.  No gross deformities. Skin: Significant skin tear to left arm; dressing placed in ED; it is blood tinged but intact. Neurologic: CN 2-12 grossly intact. Sensation intact, Strength 5/5 in all 4.  Psychiatric: Normal judgment and insight. Alert and oriented x 3. Normal mood.   Labs on Admission: I have personally reviewed following labs and imaging studies  CBC:  Recent Labs Lab 04/19/16 2345  WBC 10.5  NEUTROABS 8.5*  HGB 14.9  HCT 42.4  MCV 113.7*  PLT 167   Basic Metabolic Panel:  Recent Labs Lab 04/19/16 2345  NA 138  K 4.2  CL 104  CO2 22  GLUCOSE 95  BUN 8  CREATININE 0.45*  CALCIUM 8.6*   Coagulation Profile:  Recent Labs Lab 04/19/16 2345  INR 2.81*   Radiological Exams on Admission: Dg Hip Unilat With Pelvis 2-3 Views Left  04/19/2016  CLINICAL DATA:  Fall with left groin and proximal left upper extremity pain. EXAM: LEFT FEMUR 1 VIEW; DG HIP (WITH OR WITHOUT PELVIS) 2-3V LEFT COMPARISON:  10/22/2015 pelvic radiograph. FINDINGS: There is a nondisplaced comminuted intertrochanteric left proximal femur fracture. No left hip dislocation. No additional fracture visualized in the pelvis or distal left femur. No suspicious focal osseous lesion. Degenerative changes in the visualized lower lumbar spine. 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 No pelvic diastasis. Bulky enthesophytes at the superior greater trochanters bilaterally. IMPRESSION: Nondisplaced comminuted intertrochanteric left proximal femur fracture. The fracture lucencies are obscured by overlying skin folds, and left hip CT may be useful for further characterization. Electronically Signed   By: Delbert Phenix M.D.   On:  04/19/2016 23:29   Dg Femur 1v Left  04/19/2016  CLINICAL DATA:  Fall with left groin and proximal left upper extremity pain. EXAM: LEFT FEMUR 1 VIEW; DG HIP (WITH OR WITHOUT PELVIS) 2-3V LEFT COMPARISON:  10/22/2015 pelvic radiograph. FINDINGS: There is a nondisplaced comminuted intertrochanteric left proximal femur fracture. No left hip dislocation. No additional fracture visualized in the pelvis or distal left femur. No suspicious focal osseous lesion. Degenerative changes in the visualized lower lumbar spine. 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 No pelvic diastasis. Bulky enthesophytes at the superior greater trochanters bilaterally. IMPRESSION: Nondisplaced comminuted intertrochanteric left proximal femur fracture. The fracture lucencies are obscured by overlying skin folds, and left hip CT may be useful for further characterization. Electronically Signed   By: Delbert Phenix M.D.   On: 04/19/2016 23:29    EKG: Pending.  Assessment/Plan Principal Problem:   Femur fracture (HCC) Active Problems:   Bilateral pulmonary embolism (HCC)  DVT, bilateral lower limbs (HCC)   Smoker   Chronic anticoagulation   Femur fracture, left (HCC)  Acute femur fracture, left leg --Admit to med surg --Analgesics as needed --NPO now while awaiting plan from ortho.  Appears to be low risk for peri-operative cardiac events based on his clinical history. --Ortho consult pending --Supportive care as outlined in the Ortho Admission Orderset  Chronic anticoagulation with warfarin for a history of recurrent VTE --Daily PT/INR, INR 2.8 tonight --Hold warfarin now.  When INR is less than two, he will need a heparin bridge.  History of tachycardia, palpitations --Continue home dose of oral cardizem  DVT prophylaxis: Anticoagulated with warfarin Code Status: FULL (considers his ex-wife his emergency contact) Family Communication: Patient alone at time of admission Disposition  Plan: To be determined based on orthopedic intervention Consults called: Orthopedic surgery Dr. Linna CapriceSwinteck Admission status: Inpatient, med surg   Jerene Bearsarter,Lauraine Crespo Harrison MD [facility]  If 7PM-7AM, please contact night-coverage [email or web address] Password [email or web address]  04/20/2016, 1:48 AM

## 2016-04-20 NOTE — Progress Notes (Signed)
Patient Demographics:    Jeremy Herman, is a 70 y.o. male, DOB - 1946/11/18, ZOX:[id number]  Admit date - 04/19/2016   Admitting Physician Michael LitterNikki Carter, MD  Outpatient Primary MD for the patient is Lora PaulaFUNCHES, JOSALYN C, MD  LOS - 0   Chief Complaint  Patient presents with  . Groin Pain        Subjective:    Jeremy Herman today has, No headache, No chest pain, No abdominal pain - No new weakness tingling or numbness, No Cough - SOB. Lt Hip pain is better with meds, patient with episode of emesis today without bile or blood,   Assessment  & Plan :    Principal Problem:   Femur fracture (HCC) Active Problems:   Bilateral pulmonary embolism (HCC)   DVT, bilateral lower limbs (HCC)   Smoker   Chronic anticoagulation   Femur fracture, left (HCC)   1)Nondisplaced left intertrochanteric femur fracture-- Orthopedic consult from Dr. Linna CapriceSwinteck appreciated, possible open reduction internal fixation on 04/21/2016 after reversing his INR,  Opiates as ordered  2)Chronic anticoagulation with warfarin for a history of recurrent VTE (DVT/PE)- warfarin is on hold, patient will receive vitamin K 10 mg orally 1 today, recheck INR in a.m., patient would need Lovenox bridge perioperatively and postop   3)History of atrial arrhythmias/ tachycardia- no further palpitations, continue Cardizem for rate control, anticoagulation as above a #2   Code Status : full   Disposition Plan  : Will depend on outcome of open reduction and internal fixation of left feet fracture on 04/21/2016 most likely SNF for subacute rehabilitation  Consults  :  Dr. Linna CapriceSwinteck  Procedures  : Lt Hip ORIF planned for 04/21/16  DVT Prophylaxis  :  See # 2 above ... SCDs   Lab Results  Component Value Date   PLT 156 04/20/2016    Inpatient Medications  Scheduled Meds: . diltiazem  120 mg Oral Daily  . docusate sodium  100 mg Oral BID  .  phytonadione  10 mg Oral Once   Continuous Infusions: . sodium chloride Stopped (04/20/16 0254)   PRN Meds:.diazepam, HYDROmorphone (DILAUDID) injection, traMADol    Anti-infectives    None        Objective:   Filed Vitals:   04/20/16 0140 04/20/16 0320 04/20/16 0400 04/20/16 0431  BP: 134/81 130/83  115/60  Pulse: 112 106  98  Temp:  98.1 F (36.7 C)  97.9 F (36.6 C)  TempSrc:  Oral  Oral  Resp: 19 20  16   Height:   6' (1.829 m)   Weight:   68.5 kg (151 lb 0.2 oz)   SpO2: 95% 97%  94%    Wt Readings from Last 3 Encounters:  04/20/16 68.5 kg (151 lb 0.2 oz)  02/09/16 66.225 kg (146 lb)  10/24/15 64.8 kg (142 lb 13.7 oz)     Intake/Output Summary (Last 24 hours) at 04/20/16 0753 Last data filed at 04/20/16 0437  Gross per 24 hour  Intake    240 ml  Output      0 ml  Net    240 ml     Physical Exam  Gen:- Awake Alert, Oriented X 3, No new F.N deficits, Normal affect HEENT:- [place].AT,PERRAL  Neck-Supple Neck,No  JVD, No cervical lymphadenopathy appriciated.  Lungs- Symmetrical Chest wall movement, Good air movement bilaterally, CTAB  CV- S1, S2 normal, irregular Abd-  +ve B.Sounds, Abd Soft, No tenderness,    Extremity/Skin:- No Cyanosis, Clubbing or edema,   Left hip pain with palpation and range of motion, pedal pulses are present    Data Review:   Micro Results No results found for this or any previous visit (from the past 240 hour(s)).  Radiology Reports Chest Portable 1 View  04/20/2016  CLINICAL DATA:  Hip fracture.  Preoperative chest x-ray. EXAM: PORTABLE CHEST 1 VIEW COMPARISON:  10/24/2015 . FINDINGS: Mediastinum hilar structures normal. Borderline cardiomegaly with normal pulmonary vascularity. No focal infiltrate. No pleural effusion or pneumothorax. IMPRESSION: 1. Borderline cardiomegaly.  No pulmonary venous congestion. 2. No acute pulmonary disease. Electronically Signed   By: Maisie Fus  Register   On: 04/20/2016 07:00   Dg Hip Unilat With  Pelvis 2-3 Views Left  04/19/2016  CLINICAL DATA:  Fall with left groin and proximal left upper extremity pain. EXAM: LEFT FEMUR 1 VIEW; DG HIP (WITH OR WITHOUT PELVIS) 2-3V LEFT COMPARISON:  10/22/2015 pelvic radiograph. FINDINGS: There is a nondisplaced comminuted intertrochanteric left proximal femur fracture. No left hip dislocation. No additional fracture visualized in the pelvis or distal left femur. No suspicious focal osseous lesion. Degenerative changes in the visualized lower lumbar spine. 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 No pelvic diastasis. Bulky enthesophytes at the superior greater trochanters bilaterally. IMPRESSION: Nondisplaced comminuted intertrochanteric left proximal femur fracture. The fracture lucencies are obscured by overlying skin folds, and left hip CT may be useful for further characterization. Electronically Signed   By: Delbert Phenix M.D.   On: 04/19/2016 23:29   Dg Femur 1v Left  04/19/2016  CLINICAL DATA:  Fall with left groin and proximal left upper extremity pain. EXAM: LEFT FEMUR 1 VIEW; DG HIP (WITH OR WITHOUT PELVIS) 2-3V LEFT COMPARISON:  10/22/2015 pelvic radiograph. FINDINGS: There is a nondisplaced comminuted intertrochanteric left proximal femur fracture. No left hip dislocation. No additional fracture visualized in the pelvis or distal left femur. No suspicious focal osseous lesion. Degenerative changes in the visualized lower lumbar spine. 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 No pelvic diastasis. Bulky enthesophytes at the superior greater trochanters bilaterally. IMPRESSION: Nondisplaced comminuted intertrochanteric left proximal femur fracture. The fracture lucencies are obscured by overlying skin folds, and left hip CT may be useful for further characterization. Electronically Signed   By: Delbert Phenix M.D.   On: 04/19/2016 23:29     CBC  Recent Labs Lab 04/19/16 2345 04/20/16 0429  WBC  10.5 10.9*  HGB 14.9 13.1  HCT 42.4 38.1*  PLT 167 156  MCV 113.7* 115.1*  MCH 39.9* 39.6*  MCHC 35.1 34.4  RDW 13.9 14.1  LYMPHSABS 1.5  --   MONOABS 0.5  --   EOSABS 0.0  --   BASOSABS 0.0  --     Chemistries   Recent Labs Lab 04/19/16 2345  NA 138  K 4.2  CL 104  CO2 22  GLUCOSE 95  BUN 8  CREATININE 0.45*  CALCIUM 8.6*   ------------------------------------------------------------------------------------------------------------------ No results for input(s): CHOL, HDL, LDLCALC, TRIG, CHOLHDL, LDLDIRECT in the last 72 hours.  Lab Results  Component Value Date   HGBA1C 5 1 02/09/2016   ------------------------------------------------------------------------------------------------------------------ No results for input(s): TSH, T4TOTAL, T3FREE, THYROIDAB in the last 72 hours.  Invalid input(s): FREET3 ------------------------------------------------------------------------------------------------------------------ No results for input(s): VITAMINB12,  FOLATE, FERRITIN, TIBC, IRON, RETICCTPCT in the last 72 hours.  Coagulation profile  Recent Labs Lab 04/19/16 2345 04/20/16 0429  INR 2.81* 2.75*    No results for input(s): DDIMER in the last 72 hours.  Cardiac Enzymes No results for input(s): CKMB, TROPONINI, MYOGLOBIN in the last 168 hours.  Invalid input(s): CK ------------------------------------------------------------------------------------------------------------------ No results found for: BNP    Tahjir Silveria M.D on 04/20/2016 at 7:53 AM  Between 7am to 7pm - Pager - [phone]  After 7pm go to [email or web address] - password [email or web address]  [facility] -  Office  [phone]  Dragon dictation system was used to create this note, attempts have been made to correct errors, however presence of uncorrected errors is not a reflection quality of care provided

## 2016-04-20 NOTE — Clinical Social Work Note (Signed)
Clinical Social Work Assessment  Patient Details  Name: Jeremy Herman MRN: [id number] Date of Birth: Mar 25, 1946  Date of referral:  04/20/16               Reason for consult:  Facility Placement, Discharge Planning                Permission sought to share information with:  Facility Art therapist granted to share information::  Yes, Verbal Permission Granted  Name::        Agency::     Relationship::     Contact Information:     Housing/Transportation Living arrangements for the past 2 months:  Apartment Source of Information:  Patient Patient Interpreter Needed:  None Criminal Activity/Legal Involvement Pertinent to Current Situation/Hospitalization:  No - Comment as needed Significant Relationships: ex spouse Lives with: Alone Do you feel safe going back to the place where you live?  No (Requesting SNF placement.) Need for family participation in patient care:  No (Coment)  Care giving concerns:  Pt's care cannot be managed at home following hospital d/c.    Social Worker assessment / plan:  Pt hospitalized on 04/19/16 with a femur fx. Surgery is scheduled for 04/21/16. CSW met with pt to assist  with d/c planning. Pt reports that he lives alone and feels he will need ST Rehab following surgery. Pt reports that he has been to [facility] in the past and would like to return. CSW will contact [facility], at pt's request, to check bed availability. CSW will meet again with pt following surgery and PT recommendations to assist with d/c planning needs.  Employment status:  Retired Forensic scientist:  Medicare PT Recommendations:  Not assessed at this time [place] / Referral to community resources:  [place]  Patient/Family's Response to care:  Surgery is pending. SNF placement requested by pt.  Patient/Family's Understanding of and Emotional Response to Diagnosis, Current Treatment, and Prognosis:  Pt is aware of his medical status. Pt  met with Ortho this am. Pt feels he will need ST rehab following d/c. Pt is hoping [facility]  will have an opening for him.  Emotional Assessment Appearance:  Appears stated age Attitude/Demeanor/Rapport:  Other (cooperative) Affect (typically observed):  Calm, Appropriate Orientation:  Oriented to Self, Oriented to Place, Oriented to  Time, Oriented to Situation Alcohol / Substance use:    Psych involvement (Current and /or in the community):  No (Comment)  Discharge Needs  Concerns to be addressed:  Discharge Planning Concerns Readmission within the last 30 days:  No Current discharge risk:  None Barriers to Discharge:  No Barriers Identified   Luretha Rued, [place] 04/20/2016, 10:16 AM

## 2016-04-20 NOTE — ED Notes (Signed)
EKG delayed due to patient's shaking. Will collect when movement subsides.

## 2016-04-21 ENCOUNTER — Inpatient Hospital Stay (HOSPITAL_COMMUNITY): Payer: Medicare Other | Admitting: Certified Registered Nurse Anesthetist

## 2016-04-21 ENCOUNTER — Encounter (HOSPITAL_COMMUNITY)
Admission: EM | Disposition: A | Discharge status: Skilled Nursing Facility | Payer: Self-pay | Source: Home / Self Care | Attending: Family Medicine

## 2016-04-21 ENCOUNTER — Inpatient Hospital Stay (HOSPITAL_COMMUNITY): Payer: Medicare Other

## 2016-04-21 ENCOUNTER — Encounter (HOSPITAL_COMMUNITY): Payer: Self-pay | Admitting: Orthopedic Surgery

## 2016-04-21 DIAGNOSIS — I2699 Other pulmonary embolism without acute cor pulmonale: Secondary | ICD-10-CM

## 2016-04-21 DIAGNOSIS — S72142A Displaced intertrochanteric fracture of left femur, initial encounter for closed fracture: Secondary | ICD-10-CM | POA: Diagnosis present

## 2016-04-21 HISTORY — PX: FEMUR IM NAIL: SHX1597

## 2016-04-21 LAB — CBC
HEMATOCRIT: 36 % — AB (ref 39.0–52.0)
Hemoglobin: 12.4 g/dL — ABNORMAL LOW (ref 13.0–17.0)
MCH: 40.1 pg — AB (ref 26.0–34.0)
MCHC: 34.4 g/dL (ref 30.0–36.0)
MCV: 116.5 fL — ABNORMAL HIGH (ref 78.0–100.0)
Platelets: 124 10*3/uL — ABNORMAL LOW (ref 150–400)
RBC: 3.09 MIL/uL — ABNORMAL LOW (ref 4.22–5.81)
RDW: 14.1 % (ref 11.5–15.5)
WBC: 9.7 10*3/uL (ref 4.0–10.5)

## 2016-04-21 LAB — PROTIME-INR
INR: 1.92 — AB (ref 0.00–1.49)
INR: 1.64 — ABNORMAL HIGH (ref 0.00–1.49)
PROTHROMBIN TIME: 21.9 s — AB (ref 11.6–15.2)
Prothrombin Time: 19.5 seconds — ABNORMAL HIGH (ref 11.6–15.2)

## 2016-04-21 SURGERY — INSERTION, INTRAMEDULLARY ROD, FEMUR
Anesthesia: General | Site: Hip | Laterality: Left

## 2016-04-21 MED ORDER — MAGNESIUM CITRATE PO SOLN: 1.0000 | Freq: Once | ORAL | Status: DC | PRN

## 2016-04-21 MED ORDER — PROPOFOL 10 MG/ML IV BOLUS
INTRAVENOUS | Status: AC
  Filled 2016-04-21: qty 20

## 2016-04-21 MED ORDER — MIDAZOLAM HCL 5 MG/5ML IJ SOLN
INTRAMUSCULAR | Status: DC | PRN
  Administered 2016-04-21: 2 mg via INTRAVENOUS

## 2016-04-21 MED ORDER — SENNA 8.6 MG PO TABS
1.0000 | ORAL_TABLET | Freq: Two times a day (BID) | ORAL | Status: DC
  Administered 2016-04-21 – 2016-04-23 (×3): 8.6 mg via ORAL
  Filled 2016-04-21 (×4): qty 1

## 2016-04-21 MED ORDER — DEXAMETHASONE SODIUM PHOSPHATE 10 MG/ML IJ SOLN
INTRAMUSCULAR | Status: DC | PRN
  Administered 2016-04-21: 5 mg via INTRAVENOUS

## 2016-04-21 MED ORDER — SODIUM CHLORIDE 0.9 % IR SOLN
Status: DC | PRN
  Administered 2016-04-21: 1000 mL

## 2016-04-21 MED ORDER — POVIDONE-IODINE 10 % EX SWAB: 2.0000 "application " | Freq: Once | CUTANEOUS | Status: DC

## 2016-04-21 MED ORDER — ONDANSETRON HCL 4 MG/2ML IJ SOLN: 4.0000 mg | Freq: Four times a day (QID) | INTRAMUSCULAR | Status: DC | PRN

## 2016-04-21 MED ORDER — LACTATED RINGERS IV SOLN
INTRAVENOUS | Status: DC | PRN
  Administered 2016-04-21: 14:00:00 via INTRAVENOUS

## 2016-04-21 MED ORDER — HYDROMORPHONE HCL 1 MG/ML IJ SOLN
0.2500 mg | INTRAMUSCULAR | Status: DC | PRN
  Administered 2016-04-21 (×4): 0.5 mg via INTRAVENOUS

## 2016-04-21 MED ORDER — MENTHOL 3 MG MT LOZG: 1.0000 | LOZENGE | OROMUCOSAL | Status: DC | PRN

## 2016-04-21 MED ORDER — ROCURONIUM BROMIDE 100 MG/10ML IV SOLN
INTRAVENOUS | Status: AC
  Filled 2016-04-21: qty 1

## 2016-04-21 MED ORDER — SUCCINYLCHOLINE CHLORIDE 20 MG/ML IJ SOLN
INTRAMUSCULAR | Status: DC | PRN
  Administered 2016-04-21: 100 mg via INTRAVENOUS

## 2016-04-21 MED ORDER — HYDROMORPHONE HCL 1 MG/ML IJ SOLN
INTRAMUSCULAR | Status: AC
  Administered 2016-04-21: 0.5 mg via INTRAVENOUS
  Filled 2016-04-21: qty 1

## 2016-04-21 MED ORDER — CEFAZOLIN SODIUM-DEXTROSE 2-4 GM/100ML-% IV SOLN
2.0000 g | INTRAVENOUS | Status: AC
  Administered 2016-04-21: 2 g via INTRAVENOUS
  Filled 2016-04-21: qty 100

## 2016-04-21 MED ORDER — LIDOCAINE HCL (CARDIAC) 20 MG/ML IV SOLN
INTRAVENOUS | Status: DC | PRN
  Administered 2016-04-21: 30 mg via INTRAVENOUS

## 2016-04-21 MED ORDER — CHLORHEXIDINE GLUCONATE 4 % EX LIQD: 60.0000 mL | Freq: Once | CUTANEOUS | Status: DC

## 2016-04-21 MED ORDER — CEFAZOLIN SODIUM-DEXTROSE 2-4 GM/100ML-% IV SOLN
2.0000 g | Freq: Four times a day (QID) | INTRAVENOUS | Status: AC
  Administered 2016-04-21 – 2016-04-22 (×2): 2 g via INTRAVENOUS
  Filled 2016-04-21 (×2): qty 100

## 2016-04-21 MED ORDER — METOCLOPRAMIDE HCL 5 MG PO TABS: 5.0000 mg | ORAL_TABLET | Freq: Three times a day (TID) | ORAL | Status: DC | PRN

## 2016-04-21 MED ORDER — FENTANYL CITRATE (PF) 250 MCG/5ML IJ SOLN
INTRAMUSCULAR | Status: AC
  Filled 2016-04-21: qty 5

## 2016-04-21 MED ORDER — HYDROCODONE-ACETAMINOPHEN 5-325 MG PO TABS
1.0000 | ORAL_TABLET | Freq: Four times a day (QID) | ORAL | Status: DC | PRN
  Administered 2016-04-21 – 2016-04-22 (×5): 2 via ORAL
  Administered 2016-04-23: 1 via ORAL
  Administered 2016-04-23 (×2): 2 via ORAL
  Filled 2016-04-21 (×9): qty 2

## 2016-04-21 MED ORDER — ENOXAPARIN SODIUM 40 MG/0.4ML ~~LOC~~ SOLN
40.0000 mg | SUBCUTANEOUS | Status: DC
  Administered 2016-04-22: 40 mg via SUBCUTANEOUS
  Filled 2016-04-21: qty 0.4

## 2016-04-21 MED ORDER — ACETAMINOPHEN 650 MG RE SUPP: 650.0000 mg | Freq: Four times a day (QID) | RECTAL | Status: DC | PRN

## 2016-04-21 MED ORDER — WARFARIN - PHARMACIST DOSING INPATIENT: Freq: Every day | Status: DC

## 2016-04-21 MED ORDER — MIDAZOLAM HCL 2 MG/2ML IJ SOLN
INTRAMUSCULAR | Status: AC
  Filled 2016-04-21: qty 2

## 2016-04-21 MED ORDER — PHENYLEPHRINE HCL 10 MG/ML IJ SOLN
INTRAMUSCULAR | Status: DC | PRN
  Administered 2016-04-21 (×4): 80 ug via INTRAVENOUS

## 2016-04-21 MED ORDER — SORBITOL 70 % SOLN
30.0000 mL | Freq: Every day | Status: DC | PRN
  Filled 2016-04-21: qty 30

## 2016-04-21 MED ORDER — ONDANSETRON HCL 4 MG PO TABS: 4.0000 mg | ORAL_TABLET | Freq: Four times a day (QID) | ORAL | Status: DC | PRN

## 2016-04-21 MED ORDER — ALBUTEROL SULFATE (2.5 MG/3ML) 0.083% IN NEBU: 2.5000 mg | INHALATION_SOLUTION | RESPIRATORY_TRACT | Status: DC | PRN

## 2016-04-21 MED ORDER — LIDOCAINE HCL (CARDIAC) 20 MG/ML IV SOLN
INTRAVENOUS | Status: AC
  Filled 2016-04-21: qty 5

## 2016-04-21 MED ORDER — ALUM & MAG HYDROXIDE-SIMETH 200-200-20 MG/5ML PO SUSP: 30.0000 mL | ORAL | Status: DC | PRN

## 2016-04-21 MED ORDER — PHENOL 1.4 % MT LIQD
1.0000 | OROMUCOSAL | Status: DC | PRN
  Filled 2016-04-21: qty 177

## 2016-04-21 MED ORDER — DOCUSATE SODIUM 100 MG PO CAPS
100.0000 mg | ORAL_CAPSULE | Freq: Two times a day (BID) | ORAL | Status: DC
  Administered 2016-04-21 – 2016-04-23 (×3): 100 mg via ORAL
  Filled 2016-04-21 (×4): qty 1

## 2016-04-21 MED ORDER — ONDANSETRON HCL 4 MG/2ML IJ SOLN
INTRAMUSCULAR | Status: AC
  Filled 2016-04-21: qty 2

## 2016-04-21 MED ORDER — METOCLOPRAMIDE HCL 5 MG/ML IJ SOLN: 5.0000 mg | Freq: Three times a day (TID) | INTRAMUSCULAR | Status: DC | PRN

## 2016-04-21 MED ORDER — PROPOFOL 10 MG/ML IV BOLUS
INTRAVENOUS | Status: DC | PRN
  Administered 2016-04-21: 30 mg via INTRAVENOUS
  Administered 2016-04-21: 150 mg via INTRAVENOUS

## 2016-04-21 MED ORDER — IPRATROPIUM-ALBUTEROL 0.5-2.5 (3) MG/3ML IN SOLN
3.0000 mL | Freq: Four times a day (QID) | RESPIRATORY_TRACT | Status: DC
  Administered 2016-04-21: 3 mL via RESPIRATORY_TRACT
  Filled 2016-04-21: qty 3

## 2016-04-21 MED ORDER — CEFAZOLIN SODIUM-DEXTROSE 2-4 GM/100ML-% IV SOLN
INTRAVENOUS | Status: AC
  Filled 2016-04-21: qty 100

## 2016-04-21 MED ORDER — ACETAMINOPHEN 325 MG PO TABS: 650.0000 mg | ORAL_TABLET | Freq: Four times a day (QID) | ORAL | Status: DC | PRN

## 2016-04-21 MED ORDER — WARFARIN SODIUM 4 MG PO TABS
4.0000 mg | ORAL_TABLET | Freq: Once | ORAL | Status: AC
  Administered 2016-04-21: 4 mg via ORAL
  Filled 2016-04-21: qty 1

## 2016-04-21 MED ORDER — FENTANYL CITRATE (PF) 100 MCG/2ML IJ SOLN
INTRAMUSCULAR | Status: DC | PRN
  Administered 2016-04-21 (×2): 50 ug via INTRAVENOUS

## 2016-04-21 SURGICAL SUPPLY — 34 items
BAG ZIPLOCK 12X15 (MISCELLANEOUS) IMPLANT
BIT DRILL FLUTED FEMUR 4.2/3 (BIT) ×3 IMPLANT
BLADE HELICAL TFNA STRL (Anchor) ×3 IMPLANT
CHLORAPREP W/TINT 26ML (MISCELLANEOUS) ×3 IMPLANT
COVER PERINEAL POST (MISCELLANEOUS) ×3 IMPLANT
DRAPE C-ARM 42X120 X-RAY (DRAPES) ×3 IMPLANT
DRAPE C-ARMOR (DRAPES) ×3 IMPLANT
DRAPE ORTHO SPLIT 77X108 STRL (DRAPES)
DRAPE STERI IOBAN 125X83 (DRAPES) ×3 IMPLANT
DRAPE SURG ORHT 6 SPLT 77X108 (DRAPES) IMPLANT
DRAPE U-SHAPE 47X51 STRL (DRAPES) ×6 IMPLANT
DRSG MEPILEX BORDER 4X4 (GAUZE/BANDAGES/DRESSINGS) ×3 IMPLANT
ELECT BLADE TIP CTD 4 INCH (ELECTRODE) ×3 IMPLANT
GAUZE SPONGE 4X4 12PLY STRL (GAUZE/BANDAGES/DRESSINGS) ×3 IMPLANT
GLOVE BIO SURGEON STRL SZ8.5 (GLOVE) ×9 IMPLANT
GLOVE BIOGEL PI IND STRL 8.5 (GLOVE) ×1 IMPLANT
GLOVE BIOGEL PI INDICATOR 8.5 (GLOVE) ×2
GOWN SPEC L3 XXLG W/TWL (GOWN DISPOSABLE) ×6 IMPLANT
GOWN STRL REUS W/TWL 2XL LVL3 (GOWN DISPOSABLE) ×6 IMPLANT
GUIDEWIRE 3.2X400 (WIRE) ×6 IMPLANT
IMPL DEG TI CANN 11MM/130 (Orthopedic Implant) ×1 IMPLANT
IMPLANT DEG TI CANN 11MM/130 (Orthopedic Implant) ×3 IMPLANT
KIT BASIN OR (CUSTOM PROCEDURE TRAY) ×3 IMPLANT
LIQUID BAND (GAUZE/BANDAGES/DRESSINGS) ×3 IMPLANT
MANIFOLD NEPTUNE II (INSTRUMENTS) ×3 IMPLANT
MARKER SKIN DUAL TIP RULER LAB (MISCELLANEOUS) ×3 IMPLANT
PACK TOTAL JOINT (CUSTOM PROCEDURE TRAY) ×3 IMPLANT
SCREW LOCKING 5.0X34MM (Screw) ×3 IMPLANT
SUT MNCRL AB 3-0 PS2 18 (SUTURE) ×6 IMPLANT
SUT VIC AB 1 CT1 27 (SUTURE) ×4
SUT VIC AB 1 CT1 27XBRD ANTBC (SUTURE) ×2 IMPLANT
SUT VIC AB 2-0 CT1 27 (SUTURE) ×4
SUT VIC AB 2-0 CT1 27XBRD (SUTURE) ×2 IMPLANT
YANKAUER SUCT BULB TIP NO VENT (SUCTIONS) ×3 IMPLANT

## 2016-04-21 NOTE — NC FL2 (Signed)
[place] MEDICAID FL2 LEVEL OF CARE SCREENING TOOL     IDENTIFICATION  Patient Name: Jeremy Herman Birthdate: [birth year] Sex: male Admission Date (Current Location): 04/19/2016  [facility] and [place] Number:  Producer, television/film/video and Address:  [facility]  [address] [place] [address]      Provider Number:    Attending Physician Name and Address:  Shon Hale, MD  Relative Name and Phone Number:       Current Level of Care: Hospital Recommended Level of Care: Skilled Nursing Facility Prior Approval Number:    Date Approved/Denied:   PASRR Number: [id number]  Discharge Plan: SNF    Current Diagnoses: Patient Active Problem List   Diagnosis Date Noted  . Femur fracture (HCC) 04/20/2016  . Femur fracture, left (HCC) 04/20/2016  . Palpitations 02/09/2016  . Right hip pain   . Hypokalemia   . Megaloblastic anemia   . Thrombocytopenia (HCC)   . Hip fracture (HCC) 10/24/2015  . Tobacco abuse 10/24/2015  . Chronic anticoagulation 10/24/2015  . Hypertension 10/24/2015  . Hip fracture, right (HCC) 10/24/2015  . DVT, lower extremity (HCC)   . Smoker 07/08/2014  . 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 Bilateral pulmonary embolism (HCC) 09/17/2013  . DVT, bilateral lower limbs (HCC) 09/17/2013    Orientation RESPIRATION BLADDER Height & Weight     Self, Time, Situation, Place  O2 Continent Weight: 68.5 kg (151 lb 0.2 oz) Height:  6' (182.9 cm)  BEHAVIORAL SYMPTOMS/MOOD NEUROLOGICAL BOWEL NUTRITION STATUS  Other (Comment) (no behaviors)   Continent Diet  AMBULATORY STATUS COMMUNICATION OF NEEDS Skin   Extensive Assist Verbally Surgical wounds                       Personal Care Assistance Level of Assistance  Bathing, Dressing, Feeding Bathing Assistance: Limited assistance Feeding assistance: Independent Dressing Assistance: Limited assistance     Functional Limitations Info  Sight, Hearing, Speech Sight Info: Adequate Hearing Info:  Impaired Speech Info: Adequate    SPECIAL CARE FACTORS FREQUENCY  PT (By licensed PT), OT (By licensed OT)     PT Frequency: 5 x wk OT Frequency: 5 x wk            Contractures Contractures Info: Not present    Additional Factors Info  Code Status Code Status Info: Full Code             Current Medications (04/21/2016):  This is the current hospital active medication list Current Facility-Administered Medications  Medication Dose Route Frequency Provider Last Rate Last Dose  . 0.9 %  sodium chloride infusion   Intravenous Continuous Mancel Bale, MD   Stopped at 04/21/16 1333  . [MAR Hold] albuterol (PROVENTIL) (2.5 MG/3ML) 0.083% nebulizer solution 2.5 mg  2.5 mg Nebulization Q2H PRN Courage Emokpae, MD      . chlorhexidine (HIBICLENS) 4 % liquid 4 application  60 mL Topical Once Samson Frederic, MD      . Mitzi Hansen Hold] diazepam (VALIUM) tablet 5 mg  5 mg Oral BID PRN Michael Litter, MD      . Mitzi Hansen Hold] diltiazem (CARDIZEM CD) 24 hr capsule 120 mg  120 mg Oral Daily Michael Litter, MD   Stopped at 04/21/16 1000  . diphenhydrAMINE (BENADRYL) injection 12.5 mg  12.5 mg Intravenous Once Elson Areas, PA-C   Stopped at 04/20/16 2345  . [MAR Hold] docusate sodium (COLACE) capsule 100 mg  100 mg Oral BID Michael Litter, MD  100 mg at 04/20/16 0320  . HYDROmorphone (DILAUDID) 1 MG/ML injection           . HYDROmorphone (DILAUDID) injection 0.25-0.5 mg  0.25-0.5 mg Intravenous Q5 min PRN Judie Petitharlene Edwards, MD   0.5 mg at 04/21/16 1454  . [MAR Hold] HYDROmorphone (DILAUDID) injection 0.5 mg  0.5 mg Intravenous Q2H PRN Michael LitterNikki Carter, MD   0.5 mg at 04/21/16 1058  . [MAR Hold] ipratropium-albuterol (DUONEB) 0.5-2.5 (3) MG/3ML nebulizer solution 3 mL  3 mL Nebulization Q6H Courage Emokpae, MD      . metoCLOPramide (REGLAN) injection 5 mg  5 mg Intravenous Once Elson AreasLeslie K Sofia, PA-C   Stopped at 04/20/16 2345  . [MAR Hold] ondansetron (ZOFRAN) injection 4 mg  4 mg Intravenous Q4H PRN Shon Haleourage  Emokpae, MD   4 mg at 04/21/16 1345  . povidone-iodine 10 % swab 2 application  2 application Topical Once Samson FredericBrian Swinteck, MD      . Mitzi Hansen[MAR Hold] traMADol Janean Sark(ULTRAM) tablet 50 mg  50 mg Oral Q6H PRN Michael LitterNikki Carter, MD   50 mg at 04/20/16 [id number]     Discharge Medications: Please see discharge summary for a list of discharge medications.  Relevant Imaging Results:  Relevant Lab Results:   Additional Information SSN [id number]  Janette Harvie, Dickey GaveJamie Lee, LCSW

## 2016-04-21 NOTE — Progress Notes (Signed)
ANTICOAGULATION CONSULT NOTE - Initial Consult  Pharmacy Consult for warfarin Indication: pulmonary embolus, DVT and VTE prophylaxis  Allergies  Allergen Reactions  . Fish Allergy Anaphylaxis and Other (See Comments)    Diarrhea     Patient Measurements: Height: 6' (182.9 cm) Weight: 151 lb 0.2 oz (68.5 kg) IBW/kg (Calculated) : 77.6  Vital Signs: Temp: 98.5 F (36.9 C) (05/25 1544) Temp Source: Oral (05/25 1041) BP: 118/71 mmHg (05/25 1544) Pulse Rate: 102 (05/25 1544)  Labs:  Recent Labs  04/19/16 2345 04/20/16 0429 04/21/16 0348 04/21/16 1000  HGB 14.9 13.1 12.4*  --   HCT 42.4 38.1* 36.0*  --   PLT 167 156 124*  --   LABPROT 29.1* 28.6* 21.9* 19.5*  INR 2.81* 2.75* 1.92* 1.64*  CREATININE 0.45*  --   --   --     Estimated Creatinine Clearance: 85.6 mL/min (by C-G formula based on Cr of 0.45).   Medical History: Past Medical History  Diagnosis Date  . Medical history non-contributory   . DVT (deep venous thrombosis) (HCC)   . Arthritis   . PE (pulmonary embolism)   . Chronic anticoagulation     Medications:  Scheduled:  .  ceFAZolin (ANCEF) IV  2 g Intravenous Q6H  . diltiazem  120 mg Oral Daily  . docusate sodium  100 mg Oral BID  . [START ON 04/22/2016] enoxaparin (LOVENOX) injection  40 mg Subcutaneous Q24H  . ipratropium-albuterol  3 mL Nebulization Q6H  . senna  1 tablet Oral BID   Infusions:  . sodium chloride Stopped (04/21/16 1333)    Assessment: 70 yo male s/p intramedullary nail left femoral for left hip fracture to restart patient's warfarin post op that patient was taking for hx DVT/PE at a reported dose of 2mg  daily with 4mg  on Th/Sat. Vitamin K administered 5/24 and INR pre op today was 1.64. Hgb OK and Plt's slightly below normal limits.   Goal of Therapy:  INR 2-3    Plan:  1) Warfarin 4mg  tonight x 1  2) Daily INR 3) Discontinue Lovenox when INR >= 1.8   Hessie KnowsJustin M Feras Gardella, PharmD, BCPS Pager [phone] 04/21/2016 4:04  PM

## 2016-04-21 NOTE — Transfer of Care (Signed)
Immediate Anesthesia Transfer of Care Note  Patient: Jeremy Herman  Procedure(s) Performed: Procedure(s): INTRAMEDULLARY (IM) NAIL LEFT  FEMORAL (Left)  Patient Location: PACU  Anesthesia Type:General  Level of Consciousness:  sedated, patient cooperative and responds to stimulation  Airway & Oxygen Therapy:Patient Spontanous Breathing and Patient connected to face mask oxgen  Post-op Assessment:  Report given to PACU RN and Post -op Vital signs reviewed and stable  Post vital signs:  Reviewed and stable  Last Vitals:  Filed Vitals:   04/21/16 0543 04/21/16 1041  BP: 105/67 134/80  Pulse: 91 104  Temp: 36.8 C 36.9 C  Resp: 16 16    Complications: No apparent anesthesia complications

## 2016-04-21 NOTE — Discharge Instructions (Signed)
 Dr. Siona Coulston Adult Hip & Knee Specialist [facility] [address] [place], [place] [address] [phone]   POSTOPERATIVE DIRECTIONS    Hip Rehabilitation, Guidelines Following Surgery   WEIGHT BEARING Weight bearing as tolerated with assist device (walker, cane, etc) as directed, use it as long as suggested by your surgeon or therapist, typically at least 4-6 weeks.   HOME CARE INSTRUCTIONS  Remove items at home which could result in a fall. This includes throw rugs or furniture in walking pathways.  Continue medications as instructed at time of discharge.  You may have some home medications which will be placed on hold until you complete the course of blood thinner medication.  4 days after discharge, you may start showering. No tub baths or soaking your incisions. Do not put on socks or shoes without following the instructions of your caregivers.   Sit on chairs with arms. Use the chair arms to help push yourself up when arising.  Arrange for the use of a toilet seat elevator so you are not sitting low.   Walk with walker as instructed.  You may resume a sexual relationship in one month or when given the OK by your caregiver.  Use walker as long as suggested by your caregivers.  Avoid periods of inactivity such as sitting longer than an hour when not asleep. This helps prevent blood clots.  You may return to work once you are cleared by your surgeon.  Do not drive a car for 6 weeks or until released by your surgeon.  Do not drive while taking narcotics.  Wear elastic stockings for two weeks following surgery during the day but you may remove then at night.  Make sure you keep all of your appointments after your operation with all of your doctors and caregivers. You should call the office at the above phone number and make an appointment for approximately two weeks after the date of your surgery. Please pick up a stool softener and laxative  for home use as long as you are requiring pain medications.  ICE to the affected hip every three hours for 30 minutes at a time and then as needed for pain and swelling. Continue to use ice on the hip for pain and swelling from surgery. You may notice swelling that will progress down to the foot and ankle.  This is normal after surgery.  Elevate the leg when you are not up walking on it.   It is important for you to complete the blood thinner medication as prescribed by your doctor.  Continue to use the breathing machine which will help keep your temperature down.  It is common for your temperature to cycle up and down following surgery, especially at night when you are not up moving around and exerting yourself.  The breathing machine keeps your lungs expanded and your temperature down.  RANGE OF MOTION AND STRENGTHENING EXERCISES  These exercises are designed to help you keep full movement of your hip joint. Follow your caregiver's or physical therapist's instructions. Perform all exercises about fifteen times, three times per day or as directed. Exercise both hips, even if you have had only one joint replacement. These exercises can be done on a training (exercise) mat, on the floor, on a table or on a bed. Use whatever works the best and is most comfortable for you. Use music or television while you are exercising so that the exercises are a pleasant break in your day. This   will make your life better with the exercises acting as a break in routine you can look forward to.  Lying on your back, slowly slide your foot toward your buttocks, raising your knee up off the floor. Then slowly slide your foot back down until your leg is straight again.  Lying on your back spread your legs as far apart as you can without causing discomfort.  Lying on your side, raise your upper leg and foot straight up from the floor as far as is comfortable. Slowly lower the leg and repeat.  Lying on your back, tighten up the  muscle in the front of your thigh (quadriceps muscles). You can do this by keeping your leg straight and trying to raise your heel off the floor. This helps strengthen the largest muscle supporting your knee.  Lying on your back, tighten up the muscles of your buttocks both with the legs straight and with the knee bent at a comfortable angle while keeping your heel on the floor.   SKILLED REHAB INSTRUCTIONS: If the patient is transferred to a skilled rehab facility following release from the hospital, a list of the current medications will be sent to the facility for the patient to continue.  When discharged from the skilled rehab facility, please have the facility set up the patient's Home Health Physical Therapy prior to being released. Also, the skilled facility will be responsible for providing the patient with their medications at time of release from the facility to include their pain medication and their blood thinner medication. If the patient is still at the rehab facility at time of the two week follow up appointment, the skilled rehab facility will also need to assist the patient in arranging follow up appointment in our office and any transportation needs.  MAKE SURE YOU:  Understand these instructions.  Will watch your condition.  Will get help right away if you are not doing well or get worse.  Pick up stool softner and laxative for home use following surgery while on pain medications. Daily dry dressing changes as needed. In 4 days, you may remove your dressings and begin taking showers - no tub baths or soaking the incisions. Continue to use ice for pain and swelling after surgery. Do not use any lotions or creams on the incision until instructed by your surgeon.   

## 2016-04-21 NOTE — Anesthesia Procedure Notes (Signed)
Procedure Name: Intubation Date/Time: 04/21/2016 1:05 PM Performed by: Wynonia SoursWALKER, Gesselle Fitzsimons L Pre-anesthesia Checklist: Patient identified, Emergency Drugs available, Suction available, Patient being monitored and Timeout performed Patient Re-evaluated:Patient Re-evaluated prior to inductionOxygen Delivery Method: Circle system utilized Preoxygenation: Pre-oxygenation with 100% oxygen Intubation Type: IV induction Ventilation: Mask ventilation without difficulty and Oral airway inserted - appropriate to patient size Laryngoscope Size: Mac and 4 Grade View: Grade II Tube type: Oral Tube size: 7.5 mm Number of attempts: 1 Airway Equipment and Method: Stylet Placement Confirmation: ETT inserted through vocal cords under direct vision,  positive ETCO2,  CO2 detector and breath sounds checked- equal and bilateral Secured at: 22 cm Tube secured with: Tape Dental Injury: Teeth and Oropharynx as per pre-operative assessment

## 2016-04-21 NOTE — Care Management Note (Signed)
Case Management Note  Patient Details  Name: Jeremy Herman MRN: [id number] Date of Birth: March 11, 1946  Subjective/Objective:  S/p INTRAMEDULLARY (IM) NAIL LEFT FEMORAL (Left)                  Action/Plan: Discharge planning per CSW  Expected Discharge Date:                  Expected Discharge Plan:  Skilled Nursing Facility  In-House Referral:  Clinical Social Work  Discharge planning Services  CM Consult  Post Acute Care Choice:  NA Choice offered to:  NA  DME Arranged:  N/A DME Agency:  NA  HH Arranged:  NA HH Agency:  NA  Status of Service:  Completed, signed off  Medicare Important Message Given:    Date Medicare IM Given:    Medicare IM give by:    Date Additional Medicare IM Given:    Additional Medicare Important Message give by:     If discussed at Long Length of Stay Meetings, dates discussed:    Additional Comments:  Alexis Goodelleele, Terrian Ridlon K, RN 04/21/2016, 2:29 PM [phone]

## 2016-04-21 NOTE — Brief Op Note (Signed)
04/21/2016  1:57 PM  PATIENT:  Jeremy PaganiniJames Wing  70 y.o. male  PRE-OPERATIVE DIAGNOSIS:  left hip fracture  POST-OPERATIVE DIAGNOSIS:  left hip fracture  PROCEDURE:  Procedure(s): INTRAMEDULLARY (IM) NAIL LEFT  FEMORAL (Left)  SURGEON:  Surgeon(s) and Role:    * Samson FredericBrian Juston Goheen, MD - Primary  PHYSICIAN ASSISTANT: Skip MayerBlair Roberts, PA-C  ASSISTANTS: none   ANESTHESIA:   general  EBL:  Total I/O In: 387.5 [I.V.:387.5] Out: 250 [Urine:250]  BLOOD ADMINISTERED:none  DRAINS: none   LOCAL MEDICATIONS USED:  NONE  SPECIMEN:  No Specimen  DISPOSITION OF SPECIMEN:  N/A  COUNTS:  YES  TOURNIQUET:  * No tourniquets in log *  DICTATION: .Other Dictation: Dictation Number [id number]  PLAN OF CARE: Admit to inpatient   PATIENT DISPOSITION:  PACU - hemodynamically stable.   Delay start of Pharmacological VTE agent (>24hrs) due to surgical blood loss or risk of bleeding: no

## 2016-04-21 NOTE — H&P (View-Only) (Signed)
ORTHOPAEDIC CONSULTATION  REQUESTING PHYSICIAN: Shon Haleourage Emokpae, MD  PCP:  Lora PaulaFUNCHES, JOSALYN C, MD  Chief Complaint: Left hip pain  HPI: Jeremy PaganiniJames Herman is a 70 y.o. male who complains of  left hip pain after tripping on the leg of a coffee table last night. He landed on his left hip. He had left hip pain and inability to weight-bear. He [facility], where x-rays revealed a nondisplaced left intertrochanteric femur fracture. Orthopedic consultation was obtained. He was admitted to the hospitalist service. 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 INR in the emergency department was 2.8. The patient is actually known to me for previous right greater trochanter avulsion fracture in November 2016. He denies other injuries.  Past Medical History  Diagnosis Date  . Medical history non-contributory   . DVT (deep venous thrombosis) (HCC)   . Arthritis   . PE (pulmonary embolism)   . Chronic anticoagulation    Past Surgical History  Procedure Laterality Date  . Ankle surgery  1979    right ankle   Social History   Social History  . Marital Status: Married    Spouse Name: N/A  . Number of Children: N/A  . Years of Education: N/A   Social History Main Topics  . Smoking status: Current Every Day Smoker -- 1.00 packs/day for 43 years    Types: Cigarettes  . Smokeless tobacco: Never Used  . Alcohol Use: Yes     Comment: glass of wine a day  . Drug Use: No  . Sexual Activity: Not Currently   Other Topics Concern  . None   Social History Narrative   Family History  Problem Relation Age of Onset  . Stomach cancer Father   . Aneurysm    . Cancer Mother    Allergies  Allergen Reactions  . Fish Allergy Anaphylaxis and Other (See Comments)    Diarrhea    Prior to Admission medications   Medication Sig Start Date End Date Taking? Authorizing Provider  acetaminophen (TYLENOL) 500 MG tablet Take  1,000 mg by mouth every 6 (six) hours as needed for moderate pain or headache.   Yes Historical Provider, MD  diltiazem (CARDIZEM CD) 120 MG 24 hr capsule Take 1 capsule (120 mg total) by mouth daily. 02/09/16  Yes Josalyn Funches, MD  traMADol (ULTRAM) 50 MG tablet Take 1 tablet (50 mg total) by mouth every 6 (six) hours as needed for moderate pain or severe pain. 02/09/16  Yes Dessa PhiJosalyn Funches, MD  warfarin (COUMADIN) 2 MG tablet Take 1 tablet every day but 2 tablets on Tuesdays, Thursdays, and Saturdays Patient taking differently: Take 2-4 mg by mouth every evening. Take 1 tablet every day but 2 tablets on Thursdays, and Saturdays 03/01/16  Yes Quentin Angstlugbemiga E Jegede, MD   Chest Portable 1 View  04/20/2016  CLINICAL DATA:  Hip fracture.  Preoperative chest x-ray. EXAM: PORTABLE CHEST 1 VIEW COMPARISON:  10/24/2015 . FINDINGS: Mediastinum hilar structures normal. Borderline cardiomegaly with normal pulmonary vascularity. No focal infiltrate. No pleural effusion or pneumothorax. IMPRESSION: 1. Borderline cardiomegaly.  No pulmonary venous congestion. 2. No acute pulmonary disease. Electronically Signed   By: Maisie Fushomas  Register   On: 04/20/2016 07:00   Dg Hip Unilat With Pelvis 2-3 Views Left  04/19/2016  CLINICAL DATA:  Fall with left groin and proximal left upper extremity pain. EXAM: LEFT FEMUR 1 VIEW; DG HIP (WITH OR WITHOUT PELVIS) 2-3V LEFT COMPARISON:  10/22/2015 pelvic radiograph. FINDINGS: There is a nondisplaced comminuted intertrochanteric left proximal femur fracture. No left hip dislocation. No additional fracture visualized in the pelvis or distal left femur. No suspicious focal osseous lesion. Degenerative changes in the visualized lower lumbar spine. 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 No pelvic diastasis. Bulky enthesophytes at the superior greater trochanters bilaterally. IMPRESSION: Nondisplaced comminuted intertrochanteric left proximal femur fracture. The  fracture lucencies are obscured by overlying skin folds, and left hip CT may be useful for further characterization. Electronically Signed   By: Jason A Poff M.D.   On: 04/19/2016 23:29   Dg Femur 1v Left  04/19/2016  CLINICAL DATA:  Fall with left groin and proximal left upper extremity pain. EXAM: LEFT FEMUR 1 VIEW; DG HIP (WITH OR WITHOUT PELVIS) 2-3V LEFT COMPARISON:  10/22/2015 pelvic radiograph. FINDINGS: There is a nondisplaced comminuted intertrochanteric left proximal femur fracture. No left hip dislocation. No additional fracture visualized in the pelvis or distal left femur. No suspicious focal osseous lesion. Degenerative changes in the visualized lower lumbar spine. 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 No pelvic diastasis. Bulky enthesophytes at the superior greater trochanters bilaterally. IMPRESSION: Nondisplaced comminuted intertrochanteric left proximal femur fracture. The fracture lucencies are obscured by overlying skin folds, and left hip CT may be useful for further characterization. Electronically Signed   By: Jason A Poff M.D.   On: 04/19/2016 23:29    Positive ROS: All other systems have been reviewed and were otherwise negative with the exception of those mentioned in the HPI and as above.  Physical Exam: General: Alert, no acute distress Cardiovascular: No pedal edema Respiratory: No cyanosis, no use of accessory musculature GI: No organomegaly, abdomen is soft and non-tender Skin: No lesions in the area of chief complaint Neurologic: Sensation intact distally Psychiatric: Patient is competent for consent with normal mood and affect Lymphatic: No axillary or cervical lymphadenopathy  MUSCULOSKELETAL: Left lower extremity: Skin is intact. He has pain with logrolling of the hip. He has positive motor function dorsiflexion, plantar flexion, and great toe extension. He has palpable pedal pulses. He reports intact sensation to light  touch.  Assessment: Left intertrochanteric femur fracture Chronic Coumadin use with elevated INR Tobacco abuse  Plan: I discussed the findings with the patient. Given the nature of his fracture, I would recommend intramedullary fixation of his left femur. Currently, his INR is too elevated. I would recommend that we hold his chemical DVT prophylaxis. I will give him 10 mg of by mouth vitamin K this morning. He may have a diet today, and we will make him nothing by mouth after midnight tonight. If his INR drops sufficiently, we can proceed with surgery tomorrow. We did discuss the risks, benefits, and alternatives. He is going to need skilled nursing placement postoperatively as he has poor social support. We discussed the increased risk of fracture healing and wound healing complications including infection due to his tobacco abuse. Smoking cessation recommended. Following.  The risks, benefits, and alternatives were discussed with the patient. There are risks associated with the surgery including, but not limited to, problems with anesthesia (death), infection, differences in leg length/angulation/rotation, fracture of bones, loosening or failure of implants, malunion, nonunion, hematoma (blood accumulation) which may require surgical drainage, blood clots, pulmonary embolism, nerve injury (foot drop), and blood vessel injury. The patient understands these risks and elects to proceed.     Kathlynn Swofford Kyrell, MD Cell [phone]    04/20/2016 7:09   AM

## 2016-04-21 NOTE — Anesthesia Postprocedure Evaluation (Signed)
Anesthesia Post Note  Patient: Jeremy PaganiniJames Herman  Procedure(s) Performed: Procedure(s) (LRB): INTRAMEDULLARY (IM) NAIL LEFT  FEMORAL (Left)  Patient location during evaluation: PACU Anesthesia Type: General Level of consciousness: awake Pain management: pain level controlled Vital Signs Assessment: post-procedure vital signs reviewed and stable Respiratory status: spontaneous breathing Cardiovascular status: stable Anesthetic complications: no    Last Vitals:  Filed Vitals:   04/21/16 1430 04/21/16 1447  BP: 126/79 127/86  Pulse: 105 103  Temp:    Resp: 17 14    Last Pain:  Filed Vitals:   04/21/16 1448  PainSc: 5                  EDWARDS,Neyah Ellerman

## 2016-04-21 NOTE — Progress Notes (Signed)
Patient Demographics:    Jeremy Herman, is a 70 y.o. male, DOB - 1946/01/06, ZOX:[id number]  Admit date - 04/19/2016   Admitting Physician Michael LitterNikki Carter, MD  Outpatient Primary MD for the patient is Lora PaulaFUNCHES, JOSALYN C, MD  LOS - 1   Chief Complaint  Patient presents with  . Groin Pain        Subjective:    Jeremy Herman today has no fevers,  No chest pain,  Left hip pain is better, no further emesis, had some nausea however,  Assessment  & Plan :    Principal Problem:   Femur fracture (HCC) Active Problems:   Bilateral pulmonary embolism (HCC)   DVT, bilateral lower limbs (HCC)   Smoker   Chronic anticoagulation   Femur fracture, left (HCC)   1))Nondisplaced left intertrochanteric femur fracture-- Orthopedic consult from Dr. Linna CapriceSwinteck appreciated, for open reduction internal fixation in pm of 04/21/2016  , INR was 1.9 this am, will repeat INR around noon , consider FFP preop if INR still not at goal.  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 ., patient would need Lovenox bridge perioperatively and postop   3)History of atrial arrhythmias/ tachycardia- no further palpitations, continue Cardizem for rate control, anticoagulation as above a #2  4) tobacco use disorder/COPD-no significant flareup, bronchodilators as needed  Code Status : full   Disposition Plan : Will depend on outcome of open reduction and internal fixation of left feet fracture on 04/21/2016 most likely SNF for subacute rehabilitation  Consults : Dr. Linna CapriceSwinteck  Procedures : Lt Hip ORIF planned for 04/21/16  DVT Prophylaxis : See # 2 above ... SCDs    Lab Results  Component Value Date   PLT 124* 04/21/2016    Inpatient Medications  Scheduled Meds: .  ceFAZolin (ANCEF) IV  2 g Intravenous On Call to OR  . chlorhexidine  60 mL Topical Once  . diltiazem  120 mg Oral Daily  . diphenhydrAMINE  12.5 mg Intravenous Once  . docusate sodium  100 mg Oral BID  . metoCLOPramide (REGLAN) injection  5 mg Intravenous Once  . povidone-iodine  2 application Topical Once   Continuous Infusions: . sodium chloride Stopped (04/20/16 0254)   PRN Meds:.diazepam, HYDROmorphone (DILAUDID) injection, ondansetron (ZOFRAN) IV, traMADol    Anti-infectives    Start     Dose/Rate Route Frequency Ordered Stop   04/21/16 0719  ceFAZolin (ANCEF) IVPB 2g/100 mL premix     2 g 200 mL/hr over 30 Minutes Intravenous On call to O.R. 04/21/16 0719 04/22/16 0559        Objective:   Filed Vitals:   04/20/16 0431 04/20/16 1434 04/20/16 2152 04/21/16 0543  BP: 115/60 117/70 123/72 105/67  Pulse: 98 100 113 91  Temp: 97.9 F (36.6 C) 97.7 F (36.5 C) 99.6 F (37.6 C) 98.3 F (36.8 C)  TempSrc: Oral Axillary Oral Oral  Resp: 16 15 16 16   Height:      Weight:      SpO2:  94% 93% 93% 93%    Wt Readings from Last 3 Encounters:  04/20/16 68.5 kg (151 lb 0.2 oz)  02/09/16 66.225 kg (146 lb)  10/24/15 64.8 kg (142 lb 13.7 oz)     Intake/Output Summary (Last 24 hours) at 04/21/16 0745 Last data filed at 04/21/16 0250  Gross per 24 hour  Intake    600 ml  Output   1100 ml  Net   -500 ml     Physical Exam  Gen:- Awake Alert,   HEENT:- [place].AT, No sclera icterus Neck-Supple Neck,No JVD,.  Lungs-  Few scattered wheezes, and movement is fair and symmetrical CV- S1, S2 normal, irregular Abd-  +ve B.Sounds, Abd Soft, No tenderness,    Extremity/Skin:- No  edema,   hip pain with range of motion, pedal pulses are good    Data Review:   Micro Results No results found for this or any previous visit (from the past 240 hour(s)).  Radiology Reports Chest Portable 1 View  04/20/2016   CLINICAL DATA:  Hip fracture.  Preoperative chest x-ray. EXAM: PORTABLE CHEST 1 VIEW COMPARISON:  10/24/2015 . FINDINGS: Mediastinum hilar structures normal. Borderline cardiomegaly with normal pulmonary vascularity. No focal infiltrate. No pleural effusion or pneumothorax. IMPRESSION: 1. Borderline cardiomegaly.  No pulmonary venous congestion. 2. No acute pulmonary disease. Electronically Signed   By: Maisie Fus  Register   On: 04/20/2016 07:00   Dg Hip Unilat With Pelvis 2-3 Views Left  04/19/2016  CLINICAL DATA:  Fall with left groin and proximal left upper extremity pain. EXAM: LEFT FEMUR 1 VIEW; DG HIP (WITH OR WITHOUT PELVIS) 2-3V LEFT COMPARISON:  10/22/2015 pelvic radiograph. FINDINGS: There is a nondisplaced comminuted intertrochanteric left proximal femur fracture. No left hip dislocation. No additional fracture visualized in the pelvis or distal left femur. No suspicious focal osseous lesion. Degenerative changes in the visualized lower lumbar spine. 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 No pelvic diastasis. Bulky enthesophytes at the superior greater trochanters bilaterally. IMPRESSION: Nondisplaced comminuted intertrochanteric left proximal femur fracture. The fracture lucencies are obscured by overlying skin folds, and left hip CT may be useful for further characterization. Electronically Signed   By: Delbert Phenix M.D.   On: 04/19/2016 23:29   Dg Femur 1v Left  04/19/2016  CLINICAL DATA:  Fall with left groin and proximal left upper extremity pain. EXAM: LEFT FEMUR 1 VIEW; DG HIP (WITH OR WITHOUT PELVIS) 2-3V LEFT COMPARISON:  10/22/2015 pelvic radiograph. FINDINGS: There is a nondisplaced comminuted intertrochanteric left proximal femur fracture. No left hip dislocation. No additional fracture visualized in the pelvis or distal left femur. No suspicious focal osseous lesion. Degenerative changes in the visualized lower lumbar spine. Mild osteoarthritis in the  weight-bearing portions of both hip joints. No pelvic diastasis. Bulky enthesophytes at the superior greater trochanters bilaterally. IMPRESSION: Nondisplaced comminuted intertrochanteric left proximal femur fracture. The fracture lucencies are obscured by overlying skin folds, and left hip CT may be useful for further characterization. Electronically Signed   By: Delbert Phenix M.D.   On: 04/19/2016 23:29     CBC  Recent Labs Lab 04/19/16 2345 04/20/16 0429 04/21/16 0348  WBC 10.5 10.9* 9.7  HGB 14.9 13.1 12.4*  HCT 42.4 38.1* 36.0*  PLT 167 156 124*  MCV 113.7* 115.1* 116.5*  MCH 39.9* 39.6* 40.1*  MCHC 35.1 34.4 34.4  RDW 13.9 14.1 14.1  LYMPHSABS 1.5  --   --   MONOABS 0.5  --   --  EOSABS 0.0  --   --   BASOSABS 0.0  --   --     Chemistries   Recent Labs Lab 04/19/16 2345  NA 138  K 4.2  CL 104  CO2 22  GLUCOSE 95  BUN 8  CREATININE 0.45*  CALCIUM 8.6*   ------------------------------------------------------------------------------------------------------------------ No results for input(s): CHOL, HDL, LDLCALC, TRIG, CHOLHDL, LDLDIRECT in the last 72 hours.  Lab Results  Component Value Date   HGBA1C 5 1 02/09/2016   ------------------------------------------------------------------------------------------------------------------ No results for input(s): TSH, T4TOTAL, T3FREE, THYROIDAB in the last 72 hours.  Invalid input(s): FREET3 ------------------------------------------------------------------------------------------------------------------ No results for input(s): VITAMINB12, FOLATE, FERRITIN, TIBC, IRON, RETICCTPCT in the last 72 hours.  Coagulation profile  Recent Labs Lab 04/19/16 2345 04/20/16 0429 04/21/16 0348  INR 2.81* 2.75* 1.92*    No results for input(s): DDIMER in the last 72 hours.  Cardiac Enzymes No results for input(s): CKMB, TROPONINI, MYOGLOBIN in the last 168 hours.  Invalid input(s):  CK ------------------------------------------------------------------------------------------------------------------ No results found for: BNP   Jalyn Dutta M.D on 04/21/2016 at 7:45 AM  Between 7am to 7pm - Pager - [phone]  After 7pm go to [email or web address] - password [email or web address]  [facility] -  Office  [phone]  Dragon dictation system was used to create this note, attempts have been made to correct errors, however presence of uncorrected errors is not a reflection quality of care provided

## 2016-04-21 NOTE — Interval H&P Note (Signed)
History and Physical Interval Note:  04/21/2016 11:53 AM  Jeremy Herman  has presented today for surgery, with the diagnosis of left hip fracture  The various methods of treatment have been discussed with the patient and family. After consideration of risks, benefits and other options for treatment, the patient has consented to  Procedure(s): INTRAMEDULLARY (IM) NAIL LEFT  FEMORAL (Left) as a surgical intervention .  The patient's history has been reviewed, patient examined, no change in status, stable for surgery.  I have reviewed the patient's chart and labs.  Questions were answered to the patient's satisfaction.     Macon Lesesne, Cloyde ReamsBrian Braxon

## 2016-04-21 NOTE — Clinical Social Work Placement (Signed)
   CLINICAL SOCIAL WORK PLACEMENT  NOTE  Date:  04/21/2016  Patient Details  Name: Jeremy Herman MRN: [id number] Date of Birth: [birth year]  Clinical Social Work is seeking post-discharge placement for this patient at the Skilled  Nursing Facility level of care (*CSW will initial, date and re-position this form in  chart as items are completed):  Yes   Patient/family provided with [place] Clinical Social Work Department's list of facilities offering this level of care within the geographic area requested by the patient (or if unable, by the patient's family).  Yes   Patient/family informed of their freedom to choose among providers that offer the needed level of care, that participate in Medicare, Medicaid or managed care program needed by the patient, have an available bed and are willing to accept the patient.  Yes   Patient/family informed of 's ownership interest in [facility] and [facility], as well as of the fact that they are under no obligation to receive care at these facilities.  PASRR submitted to EDS on       PASRR number received on       Existing PASRR number confirmed on 04/21/16     FL2 transmitted to all facilities in geographic area requested by pt/family on 04/21/16     FL2 transmitted to all facilities within larger geographic area on       Patient informed that his/her managed care company has contracts with or will negotiate with certain facilities, including the following:            Patient/family informed of bed offers received.  Patient chooses bed at       Physician recommends and patient chooses bed at      Patient to be transferred to   on  .  Patient to be transferred to facility by       Patient family notified on   of transfer.  Name of family member notified:        PHYSICIAN       Additional Comment:    _______________________________________________ Royetta AsalHaidinger, Seona Clemenson Lee, LCSW  [phone] 04/21/2016, 3:02 PM

## 2016-04-21 NOTE — Anesthesia Preprocedure Evaluation (Signed)
Anesthesia Evaluation  Patient identified by MRN, date of birth, ID band Patient awake  General Assessment Comment:History noted. CE  Reviewed: Allergy & Precautions, NPO status   Airway Mallampati: II  TM Distance: >3 FB Neck ROM: Full    Dental   Pulmonary Current Smoker,    breath sounds clear to auscultation       Cardiovascular hypertension, + Peripheral Vascular Disease   Rhythm:Regular Rate:Normal     Neuro/Psych    GI/Hepatic negative GI ROS, Neg liver ROS,   Endo/Other  negative endocrine ROS  Renal/GU negative Renal ROS     Musculoskeletal   Abdominal   Peds  Hematology   Anesthesia Other Findings   Reproductive/Obstetrics                             Anesthesia Physical Anesthesia Plan  ASA: III  Anesthesia Plan: General   Post-op Pain Management:    Induction: Intravenous  Airway Management Planned: Oral ETT  Additional Equipment:   Intra-op Plan:   Post-operative Plan: Extubation in OR  Informed Consent: I have reviewed the patients History and Physical, chart, labs and discussed the procedure including the risks, benefits and alternatives for the proposed anesthesia with the patient or authorized representative who has indicated his/her understanding and acceptance.   Dental advisory given  Plan Discussed with: CRNA and Anesthesiologist  Anesthesia Plan Comments:         Anesthesia Quick Evaluation

## 2016-04-22 LAB — CBC
HCT: 34.6 % — ABNORMAL LOW (ref 39.0–52.0)
Hemoglobin: 11.9 g/dL — ABNORMAL LOW (ref 13.0–17.0)
MCH: 40.1 pg — AB (ref 26.0–34.0)
MCHC: 34.4 g/dL (ref 30.0–36.0)
MCV: 116.5 fL — AB (ref 78.0–100.0)
PLATELETS: 109 10*3/uL — AB (ref 150–400)
RBC: 2.97 MIL/uL — ABNORMAL LOW (ref 4.22–5.81)
RDW: 13.5 % (ref 11.5–15.5)
WBC: 9.8 10*3/uL (ref 4.0–10.5)

## 2016-04-22 LAB — BASIC METABOLIC PANEL
Anion gap: 6 (ref 5–15)
BUN: 11 mg/dL (ref 6–20)
CALCIUM: 7.9 mg/dL — AB (ref 8.9–10.3)
CHLORIDE: 102 mmol/L (ref 101–111)
CO2: 27 mmol/L (ref 22–32)
CREATININE: 0.55 mg/dL — AB (ref 0.61–1.24)
GFR calc Af Amer: >60 mL/min (ref >60)
GFR calc non Af Amer: >60 mL/min (ref >60)
GLUCOSE: 168 mg/dL — AB (ref 65–99)
Potassium: 4.6 mmol/L (ref 3.5–5.1)
Sodium: 135 mmol/L (ref 135–145)

## 2016-04-22 LAB — PROTIME-INR
INR: 1.3 (ref 0.00–1.49)
PROTHROMBIN TIME: 16.3 s — AB (ref 11.6–15.2)

## 2016-04-22 MED ORDER — WARFARIN SODIUM 4 MG PO TABS
4.0000 mg | ORAL_TABLET | Freq: Once | ORAL | Status: AC
  Administered 2016-04-22: 4 mg via ORAL
  Filled 2016-04-22: qty 1

## 2016-04-22 MED ORDER — ENOXAPARIN SODIUM 80 MG/0.8ML ~~LOC~~ SOLN
1.0000 mg/kg | Freq: Two times a day (BID) | SUBCUTANEOUS | Status: DC
  Administered 2016-04-22 – 2016-04-23 (×2): 70 mg via SUBCUTANEOUS
  Filled 2016-04-22 (×4): qty 0.8

## 2016-04-22 MED ORDER — WARFARIN SODIUM 4 MG PO TABS
4.0000 mg | ORAL_TABLET | Freq: Once | ORAL | Status: DC
  Filled 2016-04-22: qty 1

## 2016-04-22 NOTE — Evaluation (Signed)
Physical Therapy Evaluation Patient Details Name: Jeremy Herman MRN: [id number] DOB: [birth year] Today's Date: 04/22/2016   History of Present Illness  pt was admitted for L femur fx and is s/p IM nail. H/O ankle sx, DVT and PE  Clinical Impression  Pt admitted as above and presenting with decreased L LE strength/ROM, post op pain, generalized weakness and ambulatory balance deficits limiting functional mobility.  Pt would benefit from follow up rehab at SNF level to maximize IND and safety prior to return home with ltd assist.    Follow Up Recommendations SNF    Equipment Recommendations  None recommended by PT    Recommendations for Other Services OT consult     Precautions / Restrictions Precautions Precautions: Fall Restrictions Weight Bearing Restrictions: No LLE Weight Bearing: Weight bearing as tolerated      Mobility  Bed Mobility Overal bed mobility: Needs Assistance;+ 2 for safety/equipment Bed Mobility: Supine to Sit     Supine to sit: Min assist     General bed mobility comments: assist for LLE, cues for sequence  Transfers Overall transfer level: Needs assistance Equipment used: Rolling walker (2 wheeled) Transfers: Sit to/from Stand Sit to Stand: Min assist;+2 safety/equipment;From elevated surface         General transfer comment: assist to rise and steady. Cues for UE/LE placement  Ambulation/Gait Ambulation/Gait assistance: Min assist;Mod assist;+2 safety/equipment Ambulation Distance (Feet): 7 Feet Assistive device: Rolling walker (2 wheeled) Gait Pattern/deviations: Step-to pattern;Decreased step length - right;Decreased step length - left;Shuffle;Decreased stance time - left;Trunk flexed Gait velocity: decr   General Gait Details: cues for sequence, posture and position from [employer]            Wheelchair Mobility    Modified Rankin (Stroke Patients Only)       Balance                                              Pertinent Vitals/Pain Pain Assessment: 0-10 Pain Score: 8  Faces Pain Scale: Hurts little more Pain Location: L hip with ambulation Pain Descriptors / Indicators: Aching;Sore Pain Intervention(s): Limited activity within patient's tolerance;Monitored during session;Premedicated before session;Ice applied    Home Living Family/patient expects to be discharged to:: Skilled nursing facility Living Arrangements: Alone               Additional Comments: has high commode    Prior Function Level of Independence: Independent         Comments: Used RW for distance     Hand Dominance   Dominant Hand: Right    Extremity/Trunk Assessment   Upper Extremity Assessment: Generalized weakness           Lower Extremity Assessment: Generalized weakness;LLE deficits/detail   LLE Deficits / Details: Strength at hip 2/5 with AAROM at hip to 75 flex and 15 abd  Cervical / Trunk Assessment: Kyphotic  Communication   Communication: No difficulties  Cognition Arousal/Alertness: Awake/alert Behavior During Therapy: WFL for tasks assessed/performed Overall Cognitive Status: Within Functional Limits for tasks assessed                      General Comments      Exercises General Exercises - Lower Extremity Ankle Circles/Pumps: AROM;Both;15 reps;Supine Quad Sets: AROM;Both;10 reps;Supine Heel Slides: AAROM;Left;15 reps;Supine Hip ABduction/ADduction: AAROM;Left;10 reps;Supine      Assessment/Plan  PT Assessment Patient needs continued PT services  PT Diagnosis Difficulty walking   PT Problem List Decreased strength;Decreased range of motion;Decreased activity tolerance;Decreased mobility;Decreased knowledge of use of DME;Pain;Decreased balance  PT Treatment Interventions DME instruction;Gait training;Functional mobility training;Therapeutic activities;Therapeutic exercise;Patient/family education   PT Goals (Current goals can be found in the Care Plan  section) Acute Rehab PT Goals Patient Stated Goal: return to being independent PT Goal Formulation: With patient Potential to Achieve Goals: Good    Frequency Min 5X/week   Barriers to discharge        Co-evaluation PT/OT/SLP Co-Evaluation/Treatment: Yes Reason for Co-Treatment: For patient/therapist safety PT goals addressed during session: Mobility/safety with mobility OT goals addressed during session: ADL's and self-care       End of Session Equipment Utilized During Treatment: Gait belt Activity Tolerance: Patient tolerated treatment well;Patient limited by pain;Patient limited by fatigue Patient left: with call bell/phone within reach;in chair;with chair alarm set Nurse Communication: Mobility status         Time: [id number] PT Time Calculation (min) (ACUTE ONLY): 38 min   Charges:   PT Evaluation $PT Eval Low Complexity: 1 Procedure PT Treatments $Therapeutic Exercise: 8-22 mins   PT G Codes:        Charlayne Vultaggio 04/22/2016, 12:34 PM

## 2016-04-22 NOTE — Progress Notes (Addendum)
ANTICOAGULATION CONSULT NOTE - Follow-Up Consult  Pharmacy Consult for warfarin Indication: history of PE/DVT and VTE prophylaxis s/p orthopedic procedure  Allergies  Allergen Reactions  . Fish Allergy Anaphylaxis and Other (See Comments)    Diarrhea     Patient Measurements: Height: 6' (182.9 cm) Weight: 151 lb 0.2 oz (68.5 kg) IBW/kg (Calculated) : 77.6  Vital Signs: Temp: 97.5 F (36.4 C) (05/26 1003) Temp Source: Oral (05/26 1003) BP: 101/64 mmHg (05/26 0941) Pulse Rate: 93 (05/26 0941)  Labs:  Recent Labs  04/19/16 2345 04/20/16 0429 04/21/16 0348 04/21/16 1000 04/22/16 0356  HGB 14.9 13.1 12.4*  --  11.9*  HCT 42.4 38.1* 36.0*  --  34.6*  PLT 167 156 124*  --  109*  LABPROT 29.1* 28.6* 21.9* 19.5* 16.3*  INR 2.81* 2.75* 1.92* 1.64* 1.30  CREATININE 0.45*  --   --   --  0.55*    Estimated Creatinine Clearance: 85.6 mL/min (by C-G formula based on Cr of 0.55).   Medical History: Past Medical History  Diagnosis Date  . Medical history non-contributory   . DVT (deep venous thrombosis) (HCC)   . Arthritis   . PE (pulmonary embolism)   . Chronic anticoagulation     Medications:  Scheduled:  . diltiazem  120 mg Oral Daily  . docusate sodium  100 mg Oral BID  . enoxaparin (LOVENOX) injection  1 mg/kg Subcutaneous Q12H  . senna  1 tablet Oral BID  . warfarin  4 mg Oral ONCE-1800  . Warfarin - Pharmacist Dosing Inpatient   Does not apply q1800   Infusions:  . sodium chloride 125 mL/hr at 04/22/16 [id number]    Assessment: Patient is a 70 y/o male on warfarin PTA for hx of DVT/PE, atrial arrhythmia who is s/p intramedullary nail left femoral for left hip fracture on 04/21/16. Patient received Vitamin K 10mg  PO x 1 on 5/24 to reverse INR pre-procedure. Pharmacy asked to resume patient's warfarin post-op. Home dose of warfarin reported as 2mg  daily except 4mg  on Th/Sat. Lovenox dose adjusted today per TR[facility] to 1 mg/kg SQ q12h for bridging until INR therapeutic.      Today, 04/22/2016:  INR subtherapeutic and decreased to 1.3 today  CBC: Hgb decreased to 11.9, Pltc decreased to 109  CrCl ~ 86 ml/min CG  No bleeding issues reported  No major drug interactions with warfarin  Diet: regular, ate 100% of breakfast   Goal of Therapy:  INR 2-3    Plan:  1) Warfarin 4mg  PO x 1 today 2) Daily PT/INR 3) Monitor CBC and for s/s of bleeding 4) Discontinue Lovenox when INR > 2 per MD   Greer PickerelJigna Deane Wattenbarger, PharmD, BCPS Pager: [phone] 04/22/2016 1:53 PM

## 2016-04-22 NOTE — Progress Notes (Signed)
Pt has a ST Rehab bed at [facility] this week end if stable for d/c. Please call Week end CSW [phone] for assistance with d/c planning.  Cori RazorJamie Nguyen Butler LCSW [phone]

## 2016-04-22 NOTE — Progress Notes (Signed)
Patient Demographics:    Jeremy Herman, is a 70 y.o. male, DOB - [birth year], ZOX:[id number]  Admit date - 04/19/2016   Admitting Physician Michael Litter, MD  Outpatient Primary MD for the patient is Lora Paula, MD  LOS - 2   Chief Complaint  Patient presents with  . Groin Pain        Subjective:    Jeremy Herman today has no fevers, no emesis,  No chest pain,  Lt hip pain is better   Assessment  & Plan :    Principal Problem:   Femur fracture (HCC) Active Problems:   Bilateral pulmonary embolism (HCC)   DVT, bilateral lower limbs (HCC)   Smoker   Chronic anticoagulation   Femur fracture, left (HCC)   Fracture, intertrochanteric, left femur (HCC)   1)))Nondisplaced left intertrochanteric femur fracture-- Orthopedic consult from Dr. 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 Linna Caprice  Procedures : Lt Hip ORIF on 04/21/16 by Dr. Veda Canning  DVT Prophylaxis : See # 2 above ... SCDs     Lab Results  Component Value  Date   PLT 109* 04/22/2016    Inpatient Medications  Scheduled Meds: . diltiazem  120 mg Oral Daily  . docusate sodium  100 mg Oral BID  . enoxaparin (LOVENOX) injection  1 mg/kg Subcutaneous Q12H  . senna  1 tablet Oral BID  . Warfarin - Pharmacist Dosing Inpatient   Does not apply q1800   Continuous Infusions: . sodium chloride 125 mL/hr at 04/22/16 0639   PRN Meds:.acetaminophen **OR** acetaminophen, albuterol, alum & mag hydroxide-simeth, diazepam, HYDROcodone-acetaminophen, HYDROmorphone (DILAUDID) injection, magnesium citrate, menthol-cetylpyridinium **OR** phenol, metoCLOPramide **OR** metoCLOPramide (REGLAN) injection, ondansetron **OR** ondansetron (ZOFRAN) IV, sorbitol    Anti-infectives    Start     Dose/Rate Route Frequency Ordered Stop   04/21/16 1900  ceFAZolin (ANCEF) IVPB 2g/100 mL premix     2 g 200 mL/hr over 30 Minutes Intravenous Every 6 hours 04/21/16 1547 04/22/16 0250   04/21/16 0719  ceFAZolin (ANCEF) IVPB 2g/100 mL premix     2 g 200 mL/hr over 30 Minutes Intravenous On call to O.R. 04/21/16 0719 04/21/16 1309        Objective:   Filed Vitals:   04/21/16 2129 04/22/16 0225 04/22/16 0616 04/22/16 0835  BP: 114/70 112/72 118/78 130/81  Pulse: 103 98 97 120  Temp: 98.2 F (36.8 C) 98.4 F (36.9 C) 98.6 F (37 C)   TempSrc: Oral  Oral Oral   Resp: 16 16 16    Height:      Weight:      SpO2: 92% 94% 97% 93%    Wt Readings from Last 3 Encounters:  04/20/16 68.5 kg (151 lb 0.2 oz)  02/09/16 66.225 kg (146 lb)  10/24/15 64.8 kg (142 lb 13.7 oz)     Intake/Output Summary (Last 24 hours) at 04/22/16 0908 Last data filed at 04/22/16 0739  Gross per 24 hour  Intake 4262.5 ml  Output    650 ml  Net 3612.5 ml     Physical Exam  Gen:- Awake Alert,  In NAD HEENT:- [place].AT, No sclera icterus Neck-Supple Neck,No JVD,.  Lungs-  CTAB  CV- S1, S2 normal, irregular Abd-  +ve B.Sounds, Abd Soft, No tenderness,    Extremity/Skin:- No  edema,   Lt  Hip postop wound is clean dry and intact    Data Review:   Micro Results No results found for this or any previous visit (from the past 240 hour(s)).  Radiology Reports Pelvis Portable  04/21/2016  CLINICAL DATA:  Status post left IM nail placement EXAM: PORTABLE PELVIS 1-2 VIEWS COMPARISON:  04/19/2016 FINDINGS: Proximal medullary rod with fixation screw is now seen. The fracture fragments are in near anatomic alignment. Changes consistent with prior right proximal femoral fracture with healing are noted. IMPRESSION: Status post fixation of proximal left hip fracture. These results will be called to the ordering clinician or representative by the Radiologist Assistant, and communication documented in the PACS or zVision Dashboard. Electronically Signed   By: Alcide CleverMark  Lukens M.D.   On: 04/21/2016 15:13   Chest Portable 1 View  04/20/2016  CLINICAL DATA:  Hip fracture.  Preoperative chest x-ray. EXAM: PORTABLE CHEST 1 VIEW COMPARISON:  10/24/2015 . FINDINGS: Mediastinum hilar structures normal. Borderline cardiomegaly with normal pulmonary vascularity. No focal infiltrate. No pleural effusion or pneumothorax. IMPRESSION: 1. Borderline cardiomegaly.  No pulmonary venous congestion. 2. No acute pulmonary disease. Electronically Signed   By: Maisie Fushomas  Register   On: 04/20/2016 07:00   Dg C-arm 1-60 Min-no Report  04/21/2016  CLINICAL DATA: left hip IM Nail C-ARM 1-60 MINUTES Fluoroscopy was utilized by the requesting physician.  No radiographic interpretation.   Dg Hip Operative Unilat W Or W/o Pelvis Left  04/21/2016  CLINICAL DATA:  Status post left hip arthroplasty. EXAM: OPERATIVE LEFT HIP (WITH PELVIS IF PERFORMED) FLUOROSCOPIC VIEWS TECHNIQUE: Fluoroscopic spot image(s) were submitted for interpretation post-operatively. COMPARISON:  Radiograph 04/19/2016 FINDINGS: Fluoroscopic images from intra medullary pin fixation of intertrochanteric femoral fracture demonstrate no evidence of hardware bowel  positioning. The alignment is near anatomic. The femoral head is well situated in the acetabulum. Fluoroscopy time is recorded as 5 minutes. IMPRESSION: Status post intra medullary pin fixation of left femoral fracture, without evidence of immediate complications. Electronically Signed   By: Ted Mcalpineobrinka  Dimitrova M.D.   On: 04/21/2016 14:24   Dg Hip Unilat With Pelvis 2-3 Views Left  04/19/2016  CLINICAL DATA:  Fall with left groin and proximal left upper extremity pain. EXAM: LEFT FEMUR 1 VIEW; DG HIP (WITH OR WITHOUT PELVIS) 2-3V LEFT COMPARISON:  10/22/2015 pelvic radiograph. FINDINGS: There is a nondisplaced comminuted intertrochanteric left proximal femur fracture. No left hip dislocation. No additional fracture visualized in the pelvis or distal left femur. No suspicious focal osseous lesion. Degenerative changes in the visualized lower lumbar spine. 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 No pelvic diastasis. Bulky enthesophytes at the superior greater  trochanters bilaterally. IMPRESSION: Nondisplaced comminuted intertrochanteric left proximal femur fracture. The fracture lucencies are obscured by overlying skin folds, and left hip CT may be useful for further characterization. Electronically Signed   By: Delbert Phenix M.D.   On: 04/19/2016 23:29   Dg Femur 1v Left  04/19/2016  CLINICAL DATA:  Fall with left groin and proximal left upper extremity pain. EXAM: LEFT FEMUR 1 VIEW; DG HIP (WITH OR WITHOUT PELVIS) 2-3V LEFT COMPARISON:  10/22/2015 pelvic radiograph. FINDINGS: There is a nondisplaced comminuted intertrochanteric left proximal femur fracture. No left hip dislocation. No additional fracture visualized in the pelvis or distal left femur. No suspicious focal osseous lesion. Degenerative changes in the visualized lower lumbar spine. 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 No pelvic diastasis. Bulky enthesophytes at the superior greater  trochanters bilaterally. IMPRESSION: Nondisplaced comminuted intertrochanteric left proximal femur fracture. The fracture lucencies are obscured by overlying skin folds, and left hip CT may be useful for further characterization. Electronically Signed   By: Delbert Phenix M.D.   On: 04/19/2016 23:29     CBC  Recent Labs Lab 04/19/16 2345 04/20/16 0429 04/21/16 0348 04/22/16 0356  WBC 10.5 10.9* 9.7 9.8  HGB 14.9 13.1 12.4* 11.9*  HCT 42.4 38.1* 36.0* 34.6*  PLT 167 156 124* 109*  MCV 113.7* 115.1* 116.5* 116.5*  MCH 39.9* 39.6* 40.1* 40.1*  MCHC 35.1 34.4 34.4 34.4  RDW 13.9 14.1 14.1 13.5  LYMPHSABS 1.5  --   --   --   MONOABS 0.5  --   --   --   EOSABS 0.0  --   --   --   BASOSABS 0.0  --   --   --     Chemistries   Recent Labs Lab 04/19/16 2345 04/22/16 0356  NA 138 135  K 4.2 4.6  CL 104 102  CO2 22 27  GLUCOSE 95 168*  BUN 8 11  CREATININE 0.45* 0.55*  CALCIUM 8.6* 7.9*   ------------------------------------------------------------------------------------------------------------------ No results for input(s): CHOL, HDL, LDLCALC, TRIG, CHOLHDL, LDLDIRECT in the last 72 hours.  Lab Results  Component Value Date   HGBA1C 5 1 02/09/2016   ------------------------------------------------------------------------------------------------------------------ No results for input(s): TSH, T4TOTAL, T3FREE, THYROIDAB in the last 72 hours.  Invalid input(s): FREET3 ------------------------------------------------------------------------------------------------------------------ No results for input(s): VITAMINB12, FOLATE, FERRITIN, TIBC, IRON, RETICCTPCT in the last 72 hours.  Coagulation profile  Recent Labs Lab 04/19/16 2345 04/20/16 0429 04/21/16 0348 04/21/16 1000 04/22/16 0356  INR 2.81* 2.75* 1.92* 1.64* 1.30    No results for input(s): DDIMER in the last 72 hours.  Cardiac Enzymes No results for input(s): CKMB, TROPONINI, MYOGLOBIN in the last 168  hours.  Invalid input(s): CK ------------------------------------------------------------------------------------------------------------------ No results found for: BNP   Camryn Quesinberry M.D on 04/22/2016 at 9:08 AM  Between 7am to 7pm - Pager - [phone]  After 7pm go to [email or web address] - password [email or web address]  [facility] -  Office  [phone]  Dragon dictation system was used to create this note, attempts have been made to correct errors, however presence of uncorrected errors is not a reflection quality of care provided

## 2016-04-22 NOTE — Op Note (Signed)
NAME:  Jeremy Herman, TRAVELSTEAD NO.:  [id number]  MEDICAL RECORD NO.:  [id number]  LOCATION:  1619                         FACILITY:  [facility]  PHYSICIAN:  Samson Frederic, MD     DATE OF BIRTH:  04-15-1946  DATE OF PROCEDURE:  04/21/2016 DATE OF DISCHARGE:                              OPERATIVE REPORT   SURGEON:  Samson Frederic, MD  ASSISTANT:  Skip Mayer, PAC.  PREOPERATIVE DIAGNOSIS:  Left intertrochanteric femur fracture.  POSTOPERATIVE DIAGNOSIS:  Left intertrochanteric femur fracture.  PROCEDURE PERFORMED:  Intramedullary fixation of left intertrochanteric femur fracture.  IMPLANTS: 1. Synthes TFNA nail, size 11 x 170-mm, 130 degrees. 2. TFNA helical blade, 105 mm. 3. 5.0-mm distal interlocking screw x1, 34 mm.  ANTIBIOTICS:  2 g of Ancef.  ANESTHESIA:  General.  COMPLICATIONS:  None.  TUBES AND DRAINS:  None.  DISPOSITION:  Stable to PACU.  INDICATIONS:  The patient is a 70 year old male, who takes chronic Coumadin for lower extremity venous thrombosis and pulmonary embolism. He sustained a ground-level fall, landed on the left hip.  He had left hip pain and inability to weightbear.  He was brought to the Emergency Department at [facility], where x-rays revealed a left intertrochanteric femur fracture.  The patient was admitted to the Hospitalist Service.  He was given oral vitamin K, we waited for his INR to improve.  Risks, benefits and alternatives to intramedullary fixation of his left intertrochanteric femur fracture were explained and he elected to proceed.  DESCRIPTION OF PROCEDURE IN DETAIL:  The patient was identified in the holding area using two identifiers.  I marked the surgical site.  He was taken to the operating room, placed supine on the operating room table. The right lower extremity was scissored underneath the left.  I reduced the fracture with standard traction, internal rotation, adduction.  I brought in x-ray to  confirm the fracture reduction, he did have chronic avulsion fractures to the tip of bilateral greater trochanters.  The fracture reduced anatomically on the table.  The left hip was prepped and draped in normal sterile surgical fashion.  Time-out was called verifying the side and site of surgery.  I began by using fluoroscopy to define his anatomy.  I made an excision proximal to the tip of the trochanter.  I used the awl to determine the standard starting point for trochanteric entry nail.  I then placed a guidepin under fluoroscopic control.  I then used the entry reamer.  I then placed the real nail. Through a separate stab incision, I inserted the cannula through the jig for the cephalomedullary device.  I inserted the pin under AP and lateral fluoroscopic control into a center-center position.  I then measured, reamed and placed the real TFNA blade.  Through the jig, I compressed the fracture, I then tightened the set screw and loosened it one-quarter turn.  Through the jig, I placed the distal interlocking screw.  The jig was then removed.  Final AP and lateral fluoroscopy views were used to confirm fracture, reduction and hardware placement. Tip-apex distance was appropriate.  There was no chondral penetration. The wounds were copiously irrigated with saline.  I closed  the fascia with #1 Vicryl, deep dermal layer with 2-0 interrupted Monocryl and 3-0 running Monocryl subcuticular stitch.  Glue was applied.  Once the glue was hardened, sterile dressing was applied.  The patient was then extubated, taken to the PACU in stable condition.  Sponge, needle, and instrument counts were correct at the end of the case x2.  There were no known complications.  We will readmit the patient to the Hospitalist.  He may weightbear as tolerated with the walker.  He may resume his Coumadin.  Bridging will be per the Primary Team.  I will plan to order physical therapy, he will need disposition  planning.  I will see him in the office 2 weeks after discharge.  All questions solicited and answered.          ______________________________ Samson FredericBrian Izac Faulkenberry, MD     BS/MEDQ  D:  04/21/2016  T:  04/22/2016  Job:  [id number]

## 2016-04-22 NOTE — Evaluation (Signed)
Occupational Therapy Evaluation Patient Details Name: Jeremy Herman MRN: [id number] DOB: October 29, 1946 Today's Date: 04/22/2016    History of Present Illness pt was admitted for L femur fx and is s/p IM nail. H/O ankle sx, DVT and PE   Clinical Impression   This 70 year old man was admitted for the above surgery. He will benefit from continued OT to increase safety and independence with adls. Goals are for supervision to min guard in acute setting. Pt lived alone and was independent prior to admission    Follow Up Recommendations  SNF    Equipment Recommendations   (to be further assessed: pt has high commode)    Recommendations for Other Services       Precautions / Restrictions Precautions Precautions: Fall Restrictions Weight Bearing Restrictions: No LLE Weight Bearing: Weight bearing as tolerated      Mobility Bed Mobility Overal bed mobility: Needs Assistance;+ 2 for safety/equipment Bed Mobility: Supine to Sit     Supine to sit: Min assist     General bed mobility comments: assist for LLE, cues for sequence  Transfers Overall transfer level: Needs assistance Equipment used: Rolling walker (2 wheeled) Transfers: Sit to/from Stand Sit to Stand: Min assist;+2 safety/equipment         General transfer comment: assist to rise and steady. Cues for UE/LE placement    Balance                                            ADL Overall ADL's : Needs assistance/impaired             Lower Body Bathing: Moderate assistance;+2 for physical assistance;Sit to/from stand       Lower Body Dressing: Maximal assistance;+2 for safety/equipment;Sit to/from stand   Toilet Transfer: Minimal assistance;+2 for safety/equipment;Ambulation (recliner)             General ADL Comments: pt is able to perform UB adls with set up.  He has AE at home, but did not use this session.       Vision     Perception     Praxis      Pertinent Vitals/Pain  Pain Assessment: Faces Faces Pain Scale: Hurts little more Pain Location: L hip Pain Descriptors / Indicators: Sore Pain Intervention(s): Limited activity within patient's tolerance;Monitored during session;Premedicated before session;Repositioned;Ice applied     Hand Dominance     Extremity/Trunk Assessment Upper Extremity Assessment Upper Extremity Assessment: Generalized weakness (grossly 4-/5)           Communication Communication Communication: No difficulties   Cognition Arousal/Alertness: Awake/alert Behavior During Therapy: WFL for tasks assessed/performed Overall Cognitive Status: Within Functional Limits for tasks assessed                     General Comments       Exercises       Shoulder Instructions      Home Living Family/patient expects to be discharged to:: Skilled nursing facility Living Arrangements: Alone                               Additional Comments: has high commode      Prior Functioning/Environment Level of Independence: Independent             OT Diagnosis: Acute pain;Generalized weakness  OT Problem List: Decreased strength;Decreased activity tolerance;Impaired balance (sitting and/or standing);Decreased knowledge of use of DME or AE;Pain   OT Treatment/Interventions: Self-care/ADL training;DME and/or AE instruction;Patient/family education;Therapeutic activities    OT Goals(Current goals can be found in the care plan section) Acute Rehab OT Goals Patient Stated Goal: return to being independent OT Goal Formulation: With patient Time For Goal Achievement: 04/29/16 Potential to Achieve Goals: Good ADL Goals Pt Will Perform Grooming: with supervision;standing Pt Will Transfer to Toilet: with min guard assist;ambulating;bedside commode Pt Will Perform Toileting - Clothing Manipulation and hygiene: with supervision;sit to/from stand Additional ADL Goal #1: pt will perform LB adls with AE with supervision,  sit to stand  OT Frequency: Min 2X/week   Barriers to D/C:            Co-evaluation PT/OT/SLP Co-Evaluation/Treatment: Yes Reason for Co-Treatment: For patient/therapist safety PT goals addressed during session: Mobility/safety with mobility OT goals addressed during session: ADL's and self-care      End of Session Nurse Communication: Mobility status  Activity Tolerance: Patient tolerated treatment well Patient left: in chair;with call bell/phone within reach;with chair alarm set   Time: [id number] OT Time Calculation (min): 17 min Charges:  OT General Charges $OT Visit: 1 Procedure OT Evaluation $OT Eval Low Complexity: 1 Procedure G-Codes:    Jeremy Herman 04/22/2016, 11:07 AM  Jeremy Herman, OTR/L [phone] 04/22/2016

## 2016-04-22 NOTE — Progress Notes (Signed)
   Subjective:  Patient reports pain as mild to moderate.  Feels better since surgery.  Objective:   VITALS:   Filed Vitals:   04/21/16 2112 04/21/16 2129 04/22/16 0225 04/22/16 0616  BP:  114/70 112/72 118/78  Pulse:  103 98 97  Temp:  98.2 F (36.8 C) 98.4 F (36.9 C) 98.6 F (37 C)  TempSrc:  Oral Oral Oral  Resp:  16 16 16   Height:      Weight:      SpO2: 91% 92% 94% 97%    ABD soft Sensation intact distally Intact pulses distally Dorsiflexion/Plantar flexion intact Incision: dressing C/D/I Compartment soft No hematoma  Lab Results  Component Value Date   WBC 9.8 04/22/2016   HGB 11.9* 04/22/2016   HCT 34.6* 04/22/2016   MCV 116.5* 04/22/2016   PLT 109* 04/22/2016   BMET    Component Value Date/Time   NA 135 04/22/2016 0356   K 4.6 04/22/2016 0356   CL 102 04/22/2016 0356   CO2 27 04/22/2016 0356   GLUCOSE 168* 04/22/2016 0356   BUN 11 04/22/2016 0356   CREATININE 0.55* 04/22/2016 0356   CREATININE 0.53* 02/09/2016 1126   CALCIUM 7.9* 04/22/2016 0356   GFRNONAA >60 04/22/2016 0356   GFRNONAA >89 01/23/2015 1449   GFRAA >60 04/22/2016 0356   GFRAA >89 01/23/2015 1449     Assessment/Plan: 1 Day Post-Op   Principal Problem:   Femur fracture (HCC) Active Problems:   Bilateral pulmonary embolism (HCC)   DVT, bilateral lower limbs (HCC)   Smoker   Chronic anticoagulation   Femur fracture, left (HCC)   Fracture, intertrochanteric, left femur (HCC)   WBAT with walker PO pain control DVT ppx: resume coumadin, lovenox bridge - d/c when INR > 2 D/C planning    Winton Offord, Cloyde ReamsBrian Doren 04/22/2016, 6:29 AM   Samson FredericBrian Welda Azzarello, MD Cell [phone]

## 2016-04-23 DIAGNOSIS — S72142D Displaced intertrochanteric fracture of left femur, subsequent encounter for closed fracture with routine healing: Secondary | ICD-10-CM

## 2016-04-23 LAB — BASIC METABOLIC PANEL
ANION GAP: 6 (ref 5–15)
BUN: 10 mg/dL (ref 6–20)
CO2: 26 mmol/L (ref 22–32)
Calcium: 7.9 mg/dL — ABNORMAL LOW (ref 8.9–10.3)
Chloride: 106 mmol/L (ref 101–111)
Creatinine, Ser: 0.49 mg/dL — ABNORMAL LOW (ref 0.61–1.24)
GFR calc Af Amer: >60 mL/min (ref >60)
GLUCOSE: 110 mg/dL — AB (ref 65–99)
POTASSIUM: 3.4 mmol/L — AB (ref 3.5–5.1)
Sodium: 138 mmol/L (ref 135–145)

## 2016-04-23 LAB — CBC
HEMATOCRIT: 31 % — AB (ref 39.0–52.0)
Hemoglobin: 10.8 g/dL — ABNORMAL LOW (ref 13.0–17.0)
MCH: 40.3 pg — ABNORMAL HIGH (ref 26.0–34.0)
MCHC: 34.8 g/dL (ref 30.0–36.0)
MCV: 115.7 fL — AB (ref 78.0–100.0)
PLATELETS: 119 10*3/uL — AB (ref 150–400)
RBC: 2.68 MIL/uL — AB (ref 4.22–5.81)
RDW: 13.6 % (ref 11.5–15.5)
WBC: 10.4 10*3/uL (ref 4.0–10.5)

## 2016-04-23 LAB — PROTIME-INR
INR: 1.22 (ref 0.00–1.49)
Prothrombin Time: 15.6 seconds — ABNORMAL HIGH (ref 11.6–15.2)

## 2016-04-23 MED ORDER — ENOXAPARIN SODIUM 60 MG/0.6ML ~~LOC~~ SOLN: 60.0000 mg | Freq: Two times a day (BID) | SUBCUTANEOUS | Status: DC

## 2016-04-23 MED ORDER — ONDANSETRON HCL 4 MG PO TABS: 4.0000 mg | ORAL_TABLET | Freq: Four times a day (QID) | ORAL | Status: DC | PRN

## 2016-04-23 MED ORDER — METHOCARBAMOL 750 MG PO TABS: 750.0000 mg | ORAL_TABLET | Freq: Three times a day (TID) | ORAL | Status: DC

## 2016-04-23 MED ORDER — SENNOSIDES-DOCUSATE SODIUM 8.6-50 MG PO TABS: 2.0000 | ORAL_TABLET | Freq: Two times a day (BID) | ORAL | Status: DC

## 2016-04-23 MED ORDER — HYDROCODONE-ACETAMINOPHEN 5-325 MG PO TABS: 1.0000 | ORAL_TABLET | ORAL | Status: DC | PRN

## 2016-04-23 MED ORDER — POTASSIUM CHLORIDE CRYS ER 20 MEQ PO TBCR
40.0000 meq | EXTENDED_RELEASE_TABLET | Freq: Once | ORAL | Status: AC
  Administered 2016-04-23: 40 meq via ORAL
  Filled 2016-04-23: qty 2

## 2016-04-23 MED ORDER — WARFARIN SODIUM 5 MG PO TABS: 5.0000 mg | ORAL_TABLET | Freq: Every day | ORAL | Status: DC

## 2016-04-23 MED ORDER — WARFARIN SODIUM 5 MG PO TABS: 5.0000 mg | ORAL_TABLET | Freq: Once | ORAL | Status: DC

## 2016-04-23 NOTE — Clinical Social Work Placement (Signed)
   CLINICAL SOCIAL WORK PLACEMENT  NOTE 04/23/16 - PATIENT DISCHARGED TO [facility]  Date:  04/23/2016  Patient Details  Name: Jeremy Herman MRN: [id number] Date of Birth: 18-Aug-1946  Clinical Social Work is seeking post-discharge placement for this patient at the Skilled  Nursing Facility level of care (*CSW will initial, date and re-position this form in  chart as items are completed):  Yes   Patient/family provided with [facility] Clinical Social Work Department's list of facilities offering this level of care within the geographic area requested by the patient (or if unable, by the patient's family).  Yes   Patient/family informed of their freedom to choose among providers that offer the needed level of care, that participate in Medicare, Medicaid or managed care program needed by the patient, have an available bed and are willing to accept the patient.  Yes   Patient/family informed of [facility]'s ownership interest in [facility] and [facility], as well as of the fact that they are under no obligation to receive care at these facilities.  PASRR submitted to EDS on       PASRR number received on       Existing PASRR number confirmed on 04/21/16     FL2 transmitted to all facilities in geographic area requested by pt/family on 04/21/16     FL2 transmitted to all facilities within larger geographic area on       Patient informed that his/her managed care company has contracts with or will negotiate with certain facilities, including the following:         Yes - Patient/family informed of bed offers received.  [facility]     Physician recommends and patient chooses bed at      [facility].  Patient to be transferred to facility by  ambulance     Patient family notified on  04/23/16 of transfer - CSW contacted patient's ex-wife (face sheet shows patient as married) in error. Patient  informed CSW that he & wife divorced in March '17.  Name of family member notified:   Patient advised CSW that he has contacted the needed persons regarding discharge.     PHYSICIAN       Additional Comment:    _______________________________________________ Cristobal Goldmannrawford, Mavin Dyke Bradley, LCSW 04/23/2016, 12:18 PM

## 2016-04-23 NOTE — Progress Notes (Signed)
ANTICOAGULATION CONSULT NOTE - Follow-Up Consult  Pharmacy Consult for warfarin Indication: history of PE/DVT and VTE prophylaxis s/p orthopedic procedure  Allergies  Allergen Reactions  . Fish Allergy Anaphylaxis and Other (See Comments)    Diarrhea     Patient Measurements: Height: 6' (182.9 cm) Weight: 151 lb 0.2 oz (68.5 kg) IBW/kg (Calculated) : 77.6  Vital Signs: Temp: 98.3 F (36.8 C) (05/27 0427) Temp Source: Oral (05/27 0427) BP: 110/67 mmHg (05/27 0427) Pulse Rate: 94 (05/27 0427)  Labs:  Recent Labs  04/21/16 0348 04/21/16 1000 04/22/16 0356 04/23/16 0433  HGB 12.4*  --  11.9* 10.8*  HCT 36.0*  --  34.6* 31.0*  PLT 124*  --  109* 119*  LABPROT 21.9* 19.5* 16.3* 15.6*  INR 1.92* 1.64* 1.30 1.22  CREATININE  --   --  0.55* 0.49*    Estimated Creatinine Clearance: 85.6 mL/min (by C-G formula based on Cr of 0.49).   Medical History: Past Medical History  Diagnosis Date  . Medical history non-contributory   . DVT (deep venous thrombosis) (HCC)   . Arthritis   . PE (pulmonary embolism)   . Chronic anticoagulation     Medications:  Scheduled:  . diltiazem  120 mg Oral Daily  . docusate sodium  100 mg Oral BID  . enoxaparin (LOVENOX) injection  1 mg/kg Subcutaneous Q12H  . potassium chloride  40 mEq Oral Once  . senna  1 tablet Oral BID  . Warfarin - Pharmacist Dosing Inpatient   Does not apply q1800   Infusions:  . sodium chloride 50 mL/hr at 04/23/16 [id number]    Assessment: Patient is a 70 y/o male on warfarin PTA for hx of DVT/PE, atrial arrhythmia who is s/p intramedullary nail left femoral for left hip fracture on 04/21/16. Patient received Vitamin K 10mg  PO x 1 on 5/24 to reverse INR pre-procedure. Pharmacy asked to resume patient's warfarin post-op. Home dose of warfarin reported as 2mg  daily except 4mg  on Th/Sat. Lovenox dose adjusted today per TRH to 1 mg/kg SQ q12h for bridging until INR therapeutic.     Today, 04/23/2016:  INR  subtherapeutic and decreased to 1.22 today  CBC: Hgb decreased to 10.8, Pltc decreased to 119  CrCl ~ 85 ml/min CG  No bleeding issues reported  No major drug interactions with warfarin  Diet: regular, ate ~50% dinner   Goal of Therapy:  INR 2-3    Plan:  1) Warfarin 5mg  PO x 1 today 2) Daily PT/INR 3) Monitor CBC and for s/s of bleeding 4) Continue Lovenox 1mg /kg q12h per MD and discontinue when INR > 2 per MD   Loralee PacasErin Dore Oquin, PharmD, BCPS Pager: [phone] 04/22/2016 1:53 PM

## 2016-04-23 NOTE — Progress Notes (Signed)
Attempted to call report to [facility] x 2. Secretary Marcelino DusterMichelle states, "she can not get anyone from that unit to answer phone."   EMS on unit to transport patient. Advised Diplomatic Services operational officersecretary to inform nurse patient is on the way and to call me on the unit if she has questions.

## 2016-04-23 NOTE — Discharge Summary (Signed)
Jeremy Herman, is a 70 y.o. male  DOB Nov 29, 1945  MRN [id number].  Admission date:  04/19/2016  Admitting Physician  Michael Litter, MD  Discharge Date:  04/23/2016   Primary MD  Lora Paula, MD  Recommendations for primary care physician for things to follow:   Follow INR and CBC closely   Admission Diagnosis  Closed fracture of left femur, unspecified fracture morphology, initial encounter [facility]) [S72.92XA]   Discharge Diagnosis  Closed fracture of left femur, unspecified fracture morphology, initial encounter (HCC) [S72.92XA]   Principal Problem:   Fracture, intertrochanteric, left femur (HCC) Active Problems:   Bilateral pulmonary embolism (HCC)   DVT, bilateral lower limbs (HCC)   Smoker   Chronic anticoagulation   Femur fracture (HCC)   Femur fracture, left (HCC)      Past Medical History  Diagnosis Date  . Medical history non-contributory   . DVT (deep venous thrombosis) (HCC)   . Arthritis   . PE (pulmonary embolism)   . Chronic anticoagulation     Past Surgical History  Procedure Laterality Date  . Ankle surgery  1979    right ankle  . Femur im nail Left 04/21/2016    Procedure: INTRAMEDULLARY (IM) NAIL LEFT  FEMORAL;  Surgeon: Samson Frederic, MD;  Location: [facility] ORS;  Service: Orthopedics;  Laterality: Left;       HPI  from the history and physical done on the day of admission:    Chief Complaint: Left groin pain after accidental fall  HPI: Jeremy Herman is a 70 y.o. gentleman with a history of recurrent DVT/PE now anticoagulated for life with warfarin, palpitations and tachycardia (he denies specific diagnosis of atrial fibrillation), and active tobacco use who feels that he was in his baseline state of health until he had an accidental fall in his home today; resulting in a nondisplaced comminuted nondisplaced proximal femur fracture.  The patient reports that he  tripped on the leg of his coffee table and slid across the floor. No preceding chest pain, shortness of breath, or light-headedness. No syncope. No palpitations. No headache or aura. No chest pain or shortness of breath. No fever. No recent antibiotics. No LUTS. No spontaneous bleeding. No leg swelling. No history of heart disease, MI, CVA, or CHF.  Of note, the patient had a right hip fracture in November 2016. Nonoperative management was recommended at that time.  ED Course: Xray shows left femur fracture. Orthopedic surgery consulted from the ED. Hospitalist asked to admit.     Hospital Course:     1)Nondisplaced left intertrochanteric femur fracture-- Orthopedic consult from Dr. Linna Caprice appreciated, s/p Intramedullary fixation of left hip fracture on 04/21/2016, Opiates as ordered, according to orthopedic surgeon okay to do some weightbearing, PT eval noted, social worker consult noted, Transfer to Skilled nursing facility at [facility] for subacute rehabilitation  2)Chronic anticoagulation with warfarin for a History of recurrent VTE (DVT/PE)-  C/n warfarin 5 mg daily, Check INR daily and adjust Coumadin accordingly,  INR Is 1.20, c/n  Lovenox 60  mg q 12 hrs until INR is around 2 (Lovenox Bridge to Coumadin)  3)History of atrial arrhythmias/ tachycardia- no further palpitations, continue Cardizem for rate control, anticoagulation as above a #2  4)Tobacco use disorder/COPD-no significant flareup, bronchodilators as needed  Code Status : full   Disposition Plan :[facility]  Consults : Dr. Linna CapriceSwinteck  Procedures : Lt Hip ORIF on 04/21/16 by Dr. Veda CanningSwintek  DVT Prophylaxis : See # 2 above ... SCDs **   Discharge Condition: stable  Follow UP  Follow-up Information    Follow up with Swinteck, Cloyde ReamsBrian Lebaron, MD. Schedule an appointment as soon as possible for a visit in 2 weeks.   Specialty:   Orthopedic Surgery   Why:  For wound re-check   Contact information:   [address] [place] [address] [phone]       Follow up with Lora PaulaFUNCHES, JOSALYN C, MD In 1 month.   Specialty:  Family Medicine   Contact information:   [address] [place] [address] [phone]       Follow up with Swinteck, Cloyde ReamsBrian Damain, MD In 10 days.   Specialty:  Orthopedic Surgery   Why:  For suture removal, For wound re-check   Contact information:   [address] [place] [address] [phone]        Diet and Activity recommendation:  As advised  Discharge Instructions      Discharge Medications       Medication List    TAKE these medications        acetaminophen 500 MG tablet  Commonly known as:  TYLENOL  Take 1,000 mg by mouth every 6 (six) hours as needed for moderate pain or headache.     diltiazem 120 MG 24 hr capsule  Commonly known as:  CARDIZEM CD  Take 1 capsule (120 mg total) by mouth daily.     enoxaparin 60 MG/0.6ML injection  Commonly known as:  LOVENOX  Inject 0.6 mLs (60 mg total) into the skin every 12 (twelve) hours.     HYDROcodone-acetaminophen 5-325 MG tablet  Commonly known as:  NORCO/VICODIN  Take 1 tablet by mouth every 4 (four) hours as needed for moderate pain.     methocarbamol 750 MG tablet  Commonly known as:  ROBAXIN  Take 1 tablet (750 mg total) by mouth 3 (three) times daily.     ondansetron 4 MG tablet  Commonly known as:  ZOFRAN  Take 1 tablet (4 mg total) by mouth every 6 (six) hours as needed for nausea.     senna-docusate 8.6-50 MG tablet  Commonly known as:  Senokot-S  Take 2 tablets by mouth 2 (two) times daily.     traMADol 50 MG tablet  Commonly known as:  ULTRAM  Take 1 tablet (50 mg total) by mouth every 6 (six) hours as needed for moderate pain or severe pain.     warfarin 5 MG tablet  Commonly known as:  COUMADIN  Take 1 tablet (5 mg total) by mouth daily.        Major  procedures and Radiology Reports - PLEASE review detailed and final reports for all details, in brief -   Pelvis Portable  04/21/2016  CLINICAL DATA:  Status post left IM nail placement EXAM: PORTABLE PELVIS 1-2 VIEWS COMPARISON:  04/19/2016 FINDINGS: Proximal medullary rod with fixation screw is now seen. The fracture fragments are in near anatomic alignment. Changes consistent  with prior right proximal femoral fracture with healing are noted. IMPRESSION: Status post fixation of proximal left hip fracture. These results will be called to the ordering clinician or representative by the Radiologist Assistant, and communication documented in the PACS or zVision Dashboard. Electronically Signed   By: Alcide Clever M.D.   On: 04/21/2016 15:13   Chest Portable 1 View  04/20/2016  CLINICAL DATA:  Hip fracture.  Preoperative chest x-ray. EXAM: PORTABLE CHEST 1 VIEW COMPARISON:  10/24/2015 . FINDINGS: Mediastinum hilar structures normal. Borderline cardiomegaly with normal pulmonary vascularity. No focal infiltrate. No pleural effusion or pneumothorax. IMPRESSION: 1. Borderline cardiomegaly.  No pulmonary venous congestion. 2. No acute pulmonary disease. Electronically Signed   By: Maisie Fus  Register   On: 04/20/2016 07:00   Dg C-arm 1-60 Min-no Report  04/21/2016  CLINICAL DATA: left hip IM Nail C-ARM 1-60 MINUTES Fluoroscopy was utilized by the requesting physician.  No radiographic interpretation.   Dg Hip Operative Unilat W Or W/o Pelvis Left  04/21/2016  CLINICAL DATA:  Status post left hip arthroplasty. EXAM: OPERATIVE LEFT HIP (WITH PELVIS IF PERFORMED) FLUOROSCOPIC VIEWS TECHNIQUE: Fluoroscopic spot image(s) were submitted for interpretation post-operatively. COMPARISON:  Radiograph 04/19/2016 FINDINGS: Fluoroscopic images from intra medullary pin fixation of intertrochanteric femoral fracture demonstrate no evidence of hardware bowel positioning. The alignment is near anatomic. The femoral head is well  situated in the acetabulum. Fluoroscopy time is recorded as 5 minutes. IMPRESSION: Status post intra medullary pin fixation of left femoral fracture, without evidence of immediate complications. Electronically Signed   By: Ted Mcalpine M.D.   On: 04/21/2016 14:24   Dg Hip Unilat With Pelvis 2-3 Views Left  04/19/2016  CLINICAL DATA:  Fall with left groin and proximal left upper extremity pain. EXAM: LEFT FEMUR 1 VIEW; DG HIP (WITH OR WITHOUT PELVIS) 2-3V LEFT COMPARISON:  10/22/2015 pelvic radiograph. FINDINGS: There is a nondisplaced comminuted intertrochanteric left proximal femur fracture. No left hip dislocation. No additional fracture visualized in the pelvis or distal left femur. No suspicious focal osseous lesion. Degenerative changes in the visualized lower lumbar spine. 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 No pelvic diastasis. Bulky enthesophytes at the superior greater trochanters bilaterally. IMPRESSION: Nondisplaced comminuted intertrochanteric left proximal femur fracture. The fracture lucencies are obscured by overlying skin folds, and left hip CT may be useful for further characterization. Electronically Signed   By: Delbert Phenix M.D.   On: 04/19/2016 23:29   Dg Femur 1v Left  04/19/2016  CLINICAL DATA:  Fall with left groin and proximal left upper extremity pain. EXAM: LEFT FEMUR 1 VIEW; DG HIP (WITH OR WITHOUT PELVIS) 2-3V LEFT COMPARISON:  10/22/2015 pelvic radiograph. FINDINGS: There is a nondisplaced comminuted intertrochanteric left proximal femur fracture. No left hip dislocation. No additional fracture visualized in the pelvis or distal left femur. No suspicious focal osseous lesion. Degenerative changes in the visualized lower lumbar spine. 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 No pelvic diastasis. Bulky enthesophytes at the superior greater trochanters bilaterally. IMPRESSION: Nondisplaced comminuted  intertrochanteric left proximal femur fracture. The fracture lucencies are obscured by overlying skin folds, and left hip CT may be useful for further characterization. Electronically Signed   By: Delbert Phenix M.D.   On: 04/19/2016 23:29    Micro Results   No results found for this or any previous visit (from the past 240 hour(s)).     Today   Subjective    Jeremy Herman today has no  new c/o. Eating/drinking well, ambulatory with physical therapy, Lt Hip pain is better, no chest pains, no shortness of breath,          Patient has been seen and examined prior to discharge   Objective   Blood pressure 110/67, pulse 94, temperature 98.3 F (36.8 C), temperature source Oral, resp. rate 16, height 6' (1.829 m), weight 68.5 kg (151 lb 0.2 oz), SpO2 97 %.   Intake/Output Summary (Last 24 hours) at 04/23/16 0934 Last data filed at 04/23/16 0829  Gross per 24 hour  Intake 3320.83 ml  Output    675 ml  Net 2645.83 ml    Exam Gen:- Awake   HEENT:- [place].AT,   Neck-Supple Neck,No JVD,  Lungs- mostly clear  CV- S1, S2 normal, irregular Abd-  +ve B.Sounds, Abd Soft, No tenderness,    Extremity/Skin:- Intact peripheral pulses  Lt Hip wound is Clean/Dry/Intact    Data Review   CBC w Diff: Lab Results  Component Value Date   WBC 10.4 04/23/2016   HGB 10.8* 04/23/2016   HCT 31.0* 04/23/2016   PLT 119* 04/23/2016   LYMPHOPCT 14 04/19/2016   MONOPCT 5 04/19/2016   EOSPCT 0 04/19/2016   BASOPCT 0 04/19/2016    CMP: Lab Results  Component Value Date   NA 138 04/23/2016   K 3.4* 04/23/2016   CL 106 04/23/2016   CO2 26 04/23/2016   BUN 10 04/23/2016   CREATININE 0.49* 04/23/2016   CREATININE 0.53* 02/09/2016   PROT 7.2 01/23/2015   ALBUMIN 3.9 01/23/2015   BILITOT 0.8 01/23/2015   ALKPHOS 91 01/23/2015   AST 110* 01/23/2015   ALT 71* 01/23/2015  .   Total Discharge time is about 33 minutes  Ryiah Bellissimo M.D on 04/23/2016 at 9:34 AM  [facility]   Office   [phone]  Dragon dictation system was used to create this note, attempts have been made to correct errors, however presence of uncorrected errors is not a reflection quality of care provided

## 2016-04-23 NOTE — Progress Notes (Signed)
Physical Therapy Treatment Patient Details Name: Jeremy Herman MRN: [id number] DOB: 1946-08-21 Today's Date: 04/23/2016    History of Present Illness pt was admitted for L femur fx and is s/p IM nail. H/O ankle sx, DVT and PE    PT Comments    Pt progressing with mobility and eager for transition to rehab setting to regain IND.  Follow Up Recommendations  SNF     Equipment Recommendations  None recommended by PT    Recommendations for Other Services OT consult     Precautions / Restrictions Precautions Precautions: Fall Restrictions Weight Bearing Restrictions: No LLE Weight Bearing: Weight bearing as tolerated    Mobility  Bed Mobility Overal bed mobility: Needs Assistance Bed Mobility: Supine to Sit     Supine to sit: Min assist     General bed mobility comments: assist for LLE, cues for sequence  Transfers Overall transfer level: Needs assistance Equipment used: Rolling walker (2 wheeled) Transfers: Sit to/from Stand Sit to Stand: Min assist;From elevated surface         General transfer comment: assist to rise and steady. Cues for UE/LE placement  Ambulation/Gait Ambulation/Gait assistance: Min assist Ambulation Distance (Feet): 38 Feet Assistive device: Rolling walker (2 wheeled) Gait Pattern/deviations: Step-to pattern;Decreased step length - right;Decreased step length - left;Shuffle;Trunk flexed Gait velocity: decr   General Gait Details: cues for sequence, posture and position from [employer]            Wheelchair Mobility    Modified Rankin (Stroke Patients Only)       Balance                                    Cognition Arousal/Alertness: Awake/alert Behavior During Therapy: WFL for tasks assessed/performed Overall Cognitive Status: Within Functional Limits for tasks assessed                      Exercises General Exercises - Lower Extremity Ankle Circles/Pumps: AROM;Both;15 reps;Supine Quad  Sets: AROM;Both;10 reps;Supine Heel Slides: AAROM;Left;15 reps;Supine Hip ABduction/ADduction: AAROM;Left;10 reps;Supine    General Comments        Pertinent Vitals/Pain Pain Assessment: 0-10 Pain Score: 7  Pain Location: L hip with ambulation Pain Descriptors / Indicators: Aching;Sore Pain Intervention(s): Limited activity within patient's tolerance;Monitored during session;Premedicated before session;Ice applied    Home Living                      Prior Function            PT Goals (current goals can now be found in the care plan section) Acute Rehab PT Goals Patient Stated Goal: return to being independent PT Goal Formulation: With patient Potential to Achieve Goals: Good Progress towards PT goals: Progressing toward goals    Frequency  Min 5X/week    PT Plan Current plan remains appropriate    Co-evaluation             End of Session Equipment Utilized During Treatment: Gait belt Activity Tolerance: Patient tolerated treatment well Patient left: with call bell/phone within reach;in chair;with chair alarm set     Time: [phone] PT Time Calculation (min) (ACUTE ONLY): 29 min  Charges:  $Gait Training: 8-22 mins $Therapeutic Exercise: 8-22 mins                    G Codes:  Jeremy Herman 04/23/2016, 12:02 PM

## 2016-04-23 NOTE — Progress Notes (Signed)
Patient ID: Jeremy Herman, male   DOB: 1946/11/02, 70 y.o.   MRN: [id number]    Subjective: 2 Days Post-Op Procedure(s) (LRB): INTRAMEDULLARY (IM) NAIL LEFT  FEMORAL (Left) Patient reports pain as 2 on 0-10 scale.  Low level of pain sitting in bed, significantly increased with PT Denies CP or SOB.  Voiding without difficulty. Positive flatus. Objective: Vital signs in last 24 hours: Temp:  [97.5 F (36.4 C)-98.3 F (36.8 C)] 98.3 F (36.8 C) (05/27 0427) Pulse Rate:  [93-101] 94 (05/27 0427) Resp:  [16-18] 16 (05/27 0427) BP: (101-118)/(64-71) 110/67 mmHg (05/27 0427) SpO2:  [97 %-98 %] 97 % (05/27 0427)  Intake/Output from previous day: 05/26 0701 - 05/27 0700 In: 3320.8 [P.O.:1200; I.V.:2120.8] Out: 675 [Urine:675] Intake/Output this shift: Total I/O In: 240 [P.O.:240] Out: -   Labs:  Recent Labs  04/21/16 0348 04/22/16 0356 04/23/16 0433  HGB 12.4* 11.9* 10.8*    Recent Labs  04/22/16 0356 04/23/16 0433  WBC 9.8 10.4  RBC 2.97* 2.68*  HCT 34.6* 31.0*  PLT 109* 119*    Recent Labs  04/22/16 0356 04/23/16 0433  NA 135 138  K 4.6 3.4*  CL 102 106  CO2 27 26  BUN 11 10  CREATININE 0.55* 0.49*  GLUCOSE 168* 110*  CALCIUM 7.9* 7.9*    Recent Labs  04/22/16 0356 04/23/16 0433  INR 1.30 1.22    Physical Exam: Neurologically intact ABD soft Sensation intact distally Dorsiflexion/Plantar flexion intact Incision: dressing C/D/I Compartment soft  Assessment/Plan: 2 Days Post-Op Procedure(s) (LRB): INTRAMEDULLARY (IM) NAIL LEFT  FEMORAL (Left) Up with therapy  WBAT LLE with walker Coumadin with lovenox bridge Hospitalist pt Pt is being discharged to a SNF today  Aubrianne Molyneux, Baxter Kailarmen Christina for Dr. Venita Lickahari Brooks [facility] [phone] 04/23/2016, 8:56 AM

## 2016-06-07 ENCOUNTER — Encounter: Payer: Medicare Other | Admitting: Pharmacist

## 2016-06-13 ENCOUNTER — Ambulatory Visit: Payer: Medicare Other | Attending: Family Medicine | Admitting: Pharmacist

## 2016-06-13 DIAGNOSIS — I82409 Acute embolism and thrombosis of unspecified deep veins of unspecified lower extremity: Secondary | ICD-10-CM

## 2016-06-13 DIAGNOSIS — I2699 Other pulmonary embolism without acute cor pulmonale: Secondary | ICD-10-CM | POA: Diagnosis not present

## 2016-06-13 DIAGNOSIS — F17200 Nicotine dependence, unspecified, uncomplicated: Secondary | ICD-10-CM

## 2016-06-13 LAB — POCT INR

## 2016-06-13 MED ORDER — PHYTONADIONE 5 MG PO TABS
5.0000 mg | ORAL_TABLET | Freq: Once | ORAL | Status: AC
  Administered 2016-06-13: 5 mg via ORAL

## 2016-06-13 NOTE — Progress Notes (Signed)
Pharmacy Anticoagulation Clinic  Subjective: Patient presents today for INR monitoring. Anticoagulation indication is PE/DVT - long term anticoagulation.   Current dose of warfarin: 7.5 mg daily - he had been on this since discharge from the hospital.   Adherence to warfarin: denies any missed doses Signs/symptoms of bleeding: got a cut that bled x 1 hour before it finally stopped, also has a lot of bilateral lower and upper extremity bruising Recent changes in diet: back at home and eating normal diet Recent changes in medications: none Upcoming procedures that may impact anticoagulation: none   Objective: Today's INR = >8.0  Lab Results  Component Value Date   INR >8.0 06/13/2016   INR 1.22 04/23/2016   INR 1.30 04/22/2016     Assessment and Plan: Anticoagulation: Patient is Supratherapeutic based on patient's INR of >8.0 and patient's INR goal of 2-3. Will Stop current warfarin dose and administered vitamin K 5 mg PO x 1. Patient to return to clinic in 48 hours. Instructed patient to immediately go to the ED if he has bleeding that does stop, if he falls, if he hits his head, or any blood in the urine or stool. Patient verbalized understanding.   Of note, the dose patient was discharged from the hospital on and was on while being seen by home health is much higher than what he is usually on. I believe that since he has returned to his normal daily life and is at home, he will require a much lower dose, similar to what he has been on in the past with me.   Patient verbalized understanding and was provided with written instructions. Next INR check planned for 06/15/16.

## 2016-06-14 NOTE — Addendum Note (Signed)
Addended by: Juanita CraverKARL, Adira Limburg A on: 06/14/2016 09:10 AM   Modules accepted: Orders, Medications

## 2016-06-15 ENCOUNTER — Ambulatory Visit: Payer: Medicare Other | Attending: Family Medicine | Admitting: Pharmacist

## 2016-06-15 DIAGNOSIS — I2699 Other pulmonary embolism without acute cor pulmonale: Secondary | ICD-10-CM | POA: Diagnosis present

## 2016-06-15 DIAGNOSIS — F17200 Nicotine dependence, unspecified, uncomplicated: Secondary | ICD-10-CM

## 2016-06-15 DIAGNOSIS — I82409 Acute embolism and thrombosis of unspecified deep veins of unspecified lower extremity: Secondary | ICD-10-CM | POA: Insufficient documentation

## 2016-06-15 LAB — POCT INR: INR: 4.8

## 2016-06-20 ENCOUNTER — Ambulatory Visit: Payer: Medicare Other | Attending: Family Medicine | Admitting: Pharmacist

## 2016-06-20 DIAGNOSIS — I82409 Acute embolism and thrombosis of unspecified deep veins of unspecified lower extremity: Secondary | ICD-10-CM | POA: Diagnosis not present

## 2016-06-20 DIAGNOSIS — I2699 Other pulmonary embolism without acute cor pulmonale: Secondary | ICD-10-CM | POA: Insufficient documentation

## 2016-06-20 DIAGNOSIS — F17200 Nicotine dependence, unspecified, uncomplicated: Secondary | ICD-10-CM

## 2016-06-20 LAB — POCT INR: INR: 2.3

## 2016-06-28 ENCOUNTER — Ambulatory Visit: Payer: Medicare Other | Attending: Internal Medicine | Admitting: Pharmacist

## 2016-06-28 DIAGNOSIS — I2699 Other pulmonary embolism without acute cor pulmonale: Secondary | ICD-10-CM

## 2016-06-28 DIAGNOSIS — I82409 Acute embolism and thrombosis of unspecified deep veins of unspecified lower extremity: Secondary | ICD-10-CM

## 2016-06-28 DIAGNOSIS — F17200 Nicotine dependence, unspecified, uncomplicated: Secondary | ICD-10-CM

## 2016-06-28 LAB — POCT INR: INR: 1.7

## 2016-07-05 ENCOUNTER — Ambulatory Visit: Payer: Medicare Other | Attending: Family Medicine | Admitting: Pharmacist

## 2016-07-05 DIAGNOSIS — F17200 Nicotine dependence, unspecified, uncomplicated: Secondary | ICD-10-CM

## 2016-07-05 DIAGNOSIS — I82409 Acute embolism and thrombosis of unspecified deep veins of unspecified lower extremity: Secondary | ICD-10-CM

## 2016-07-05 DIAGNOSIS — I2699 Other pulmonary embolism without acute cor pulmonale: Secondary | ICD-10-CM

## 2016-07-05 LAB — POCT INR: INR: 2.2

## 2016-07-11 MED FILL — DILTIAZEM 24HR ER 120 MG CA: 120 | 30 days supply | Qty: 30 | Fill #3

## 2016-07-11 MED FILL — WARFARIN SODIUM 2 MG TABLET: 2 | 30 days supply | Qty: 45 | Fill #2

## 2016-07-19 ENCOUNTER — Ambulatory Visit: Payer: Medicare Other | Attending: Internal Medicine | Admitting: Pharmacist

## 2016-07-19 DIAGNOSIS — I2699 Other pulmonary embolism without acute cor pulmonale: Secondary | ICD-10-CM

## 2016-07-19 DIAGNOSIS — I82409 Acute embolism and thrombosis of unspecified deep veins of unspecified lower extremity: Secondary | ICD-10-CM

## 2016-07-19 DIAGNOSIS — F17200 Nicotine dependence, unspecified, uncomplicated: Secondary | ICD-10-CM

## 2016-07-19 LAB — POCT INR: INR: 1.6

## 2016-07-26 ENCOUNTER — Ambulatory Visit: Payer: Medicare Other | Attending: Internal Medicine | Admitting: Pharmacist

## 2016-07-26 DIAGNOSIS — F17200 Nicotine dependence, unspecified, uncomplicated: Secondary | ICD-10-CM

## 2016-07-26 DIAGNOSIS — I82409 Acute embolism and thrombosis of unspecified deep veins of unspecified lower extremity: Secondary | ICD-10-CM

## 2016-07-26 DIAGNOSIS — I2699 Other pulmonary embolism without acute cor pulmonale: Secondary | ICD-10-CM

## 2016-07-26 LAB — POCT INR: INR: 1.6

## 2016-08-02 ENCOUNTER — Ambulatory Visit: Payer: Medicare Other | Attending: Internal Medicine | Admitting: Pharmacist

## 2016-08-02 DIAGNOSIS — F17200 Nicotine dependence, unspecified, uncomplicated: Secondary | ICD-10-CM

## 2016-08-02 DIAGNOSIS — I2699 Other pulmonary embolism without acute cor pulmonale: Secondary | ICD-10-CM

## 2016-08-02 DIAGNOSIS — I82409 Acute embolism and thrombosis of unspecified deep veins of unspecified lower extremity: Secondary | ICD-10-CM

## 2016-08-02 LAB — POCT INR: INR: 2.1

## 2016-08-05 ENCOUNTER — Ambulatory Visit: Payer: Medicare Other | Attending: Family Medicine | Admitting: Family Medicine

## 2016-08-05 ENCOUNTER — Encounter: Payer: Self-pay | Admitting: Family Medicine

## 2016-08-05 VITALS — BP 143/83 | HR 96 | Temp 98.5°F | Wt 149.2 lb

## 2016-08-05 DIAGNOSIS — I10 Essential (primary) hypertension: Secondary | ICD-10-CM | POA: Diagnosis not present

## 2016-08-05 DIAGNOSIS — R002 Palpitations: Secondary | ICD-10-CM | POA: Insufficient documentation

## 2016-08-05 DIAGNOSIS — I2699 Other pulmonary embolism without acute cor pulmonale: Secondary | ICD-10-CM | POA: Diagnosis not present

## 2016-08-05 DIAGNOSIS — I824Y3 Acute embolism and thrombosis of unspecified deep veins of proximal lower extremity, bilateral: Secondary | ICD-10-CM | POA: Insufficient documentation

## 2016-08-05 DIAGNOSIS — Z1159 Encounter for screening for other viral diseases: Secondary | ICD-10-CM | POA: Diagnosis not present

## 2016-08-05 DIAGNOSIS — F17210 Nicotine dependence, cigarettes, uncomplicated: Secondary | ICD-10-CM | POA: Insufficient documentation

## 2016-08-05 DIAGNOSIS — Z7901 Long term (current) use of anticoagulants: Secondary | ICD-10-CM

## 2016-08-05 MED ORDER — DILTIAZEM HCL ER COATED BEADS 120 MG PO CP24: 120.0000 mg | ORAL_CAPSULE | Freq: Every day | ORAL | 3 refills | Status: DC

## 2016-08-05 MED ORDER — WARFARIN SODIUM 5 MG PO TABS: 5.0000 mg | ORAL_TABLET | Freq: Every day | ORAL | 3 refills | Status: DC

## 2016-08-05 NOTE — Patient Instructions (Addendum)
Jeremy Herman was seen today for medication refill.  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 -     CBC; Future -     BASIC METABOLIC PANEL WITH GFR; Future  Palpitations -     diltiazem (CARDIZEM CD) 120 MG 24 hr capsule; Take 1 capsule (120 mg total) by mouth daily.  Bilateral pulmonary embolism (HCC) -     warfarin (COUMADIN) 5 MG tablet; Take 1 tablet (5 mg total) by mouth daily. -     CBC; Future -     BASIC METABOLIC PANEL WITH GFR; Future  Chronic anticoagulation -     warfarin (COUMADIN) 5 MG tablet; Take 1 tablet (5 mg total) by mouth daily. -     CBC; Future -     BASIC METABOLIC PANEL WITH GFR; Future  Need for hepatitis C screening test -     Hepatitis C antibody, reflex; Future   I recommend screening colonoscopy  F/u in 2 months for blood work   F/u in 6 months for routine check in  Dr. Armen PickupFunches

## 2016-08-05 NOTE — Progress Notes (Signed)
Subjective:  Patient ID: Jeremy Herman, male    DOB: 1946-03-23  Age: 70 y.o. MRN: [id number]  CC: Medication Refill   HPI Jeremy Herman presents for   1. Hx of DVT and PE: on coumadin. No bleeding. Has bruising in legs.   2. HTN: compliant with diltiazem. No CP, SOB or leg swelling.   Social History  Substance Use Topics  . Smoking status: Current Every Day Smoker    Packs/day: 1.00    Years: 43.00    Types: Cigarettes  . Smokeless tobacco: Never Used  . Alcohol use Yes     Comment: glass of wine a day    Outpatient Medications Prior to Visit  Medication Sig Dispense Refill  . acetaminophen (TYLENOL) 500 MG tablet Take 1,000 mg by mouth every 6 (six) hours as needed for moderate pain or headache. Reported on 06/14/2016    . diltiazem (CARDIZEM CD) 120 MG 24 hr capsule Take 1 capsule (120 mg total) by mouth daily. 30 capsule 3  . warfarin (COUMADIN) 5 MG tablet Take 1 tablet (5 mg total) by mouth daily. 30 tablet 0  . HYDROcodone-acetaminophen (NORCO/VICODIN) 5-325 MG tablet Take 1 tablet by mouth every 4 (four) hours as needed for moderate pain. (Patient not taking: Reported on 08/05/2016) 30 tablet 0  . methocarbamol (ROBAXIN) 750 MG tablet Take 1 tablet (750 mg total) by mouth 3 (three) times daily. (Patient not taking: Reported on 08/05/2016) 90 tablet 0  . senna-docusate (SENOKOT-S) 8.6-50 MG tablet Take 2 tablets by mouth 2 (two) times daily. (Patient not taking: Reported on 08/05/2016) 60 tablet 0  . traMADol (ULTRAM) 50 MG tablet Take 1 tablet (50 mg total) by mouth every 6 (six) hours as needed for moderate pain or severe pain. (Patient not taking: Reported on 08/05/2016) 60 tablet 0   No facility-administered medications prior to visit.     ROS Review of Systems  Constitutional: Negative for chills, fatigue, fever and unexpected weight change.  Eyes: Negative for visual disturbance.  Respiratory: Negative for cough and shortness of breath.   Cardiovascular: Negative for  chest pain, palpitations and leg swelling.  Gastrointestinal: Negative for abdominal pain, blood in stool, constipation, diarrhea, nausea and vomiting.  Endocrine: Negative for polydipsia, polyphagia and polyuria.  Musculoskeletal: Negative for arthralgias, back pain, gait problem, myalgias and neck pain.  Skin: Negative for rash.  Allergic/Immunologic: Negative for immunocompromised state.  Hematological: Negative for adenopathy (easy bruising in legs. no bleeding ). Bruises/bleeds easily.  Psychiatric/Behavioral: Negative for dysphoric mood, sleep disturbance and suicidal ideas. The patient is not nervous/anxious.     Objective:  BP (!) 143/83 (BP Location: Right Arm, Patient Position: Sitting, Cuff Size: Small)   Pulse 96   Temp 98.5 F (36.9 C) (Oral)   Wt 149 lb 3.2 oz (67.7 kg)   SpO2 97%   BMI 20.24 kg/m   BP/Weight 08/05/2016 04/23/2016 04/20/2016  Systolic BP 143 100 -  Diastolic BP 83 60 -  Wt. (Lbs) 149.2 - 151.02  BMI 20.24 - 20.48   Wt Readings from Last 3 Encounters:  08/05/16 149 lb 3.2 oz (67.7 kg)  04/20/16 151 lb 0.2 oz (68.5 kg)  02/09/16 146 lb (66.2 kg)    Physical Exam  Constitutional: He appears well-developed and well-nourished. No distress.  HENT:  Head: Normocephalic and atraumatic.  Neck: Normal range of motion. Neck supple.  Cardiovascular: Normal rate, regular rhythm, normal heart sounds and intact distal pulses.   Pulmonary/Chest: Effort normal and breath sounds  normal.  Musculoskeletal: He exhibits no edema.  Neurological: He is alert.  Skin: Skin is warm and dry. No rash noted. No erythema.  Psychiatric: He has a normal mood and affect.   Lab Results  Component Value Date   INR 2.1 08/02/2016   INR 1.6 07/26/2016   INR 1.6 07/19/2016     Assessment & Plan:   There are no diagnoses linked to this encounter. Journee was seen today for medication refill.  Diagnoses and all orders for this visit:  Deep vein thrombosis (DVT) of proximal  vein of both lower extremities, unspecified chronicity (HCC) -     warfarin (COUMADIN) 5 MG tablet; Take 1 tablet (5 mg total) by mouth daily. -     CBC; Future -     BASIC METABOLIC PANEL WITH GFR; Future  Palpitations -     diltiazem (CARDIZEM CD) 120 MG 24 hr capsule; Take 1 capsule (120 mg total) by mouth daily.  Bilateral pulmonary embolism (HCC) -     warfarin (COUMADIN) 5 MG tablet; Take 1 tablet (5 mg total) by mouth daily. -     CBC; Future -     BASIC METABOLIC PANEL WITH GFR; Future  Chronic anticoagulation -     warfarin (COUMADIN) 5 MG tablet; Take 1 tablet (5 mg total) by mouth daily. -     CBC; Future -     BASIC METABOLIC PANEL WITH GFR; Future  Need for hepatitis C screening test -     Hepatitis C antibody, reflex; Future   No orders of the defined types were placed in this encounter.   Follow-up: Return in about 6 months (around 02/02/2017) for check in .   Dessa Phi MD

## 2016-08-08 MED FILL — DILTIAZEM 24HR ER 120 MG CA: 120 | 30 days supply | Qty: 30 | Fill #0

## 2016-08-12 ENCOUNTER — Other Ambulatory Visit: Payer: Self-pay | Admitting: Pharmacist

## 2016-08-12 MED ORDER — WARFARIN SODIUM 2 MG PO TABS: ORAL_TABLET | ORAL | 3 refills | Status: DC

## 2016-08-12 MED FILL — WARFARIN SODIUM 2 MG TABLET: 2 | 15 days supply | Qty: 40 | Fill #0

## 2016-08-16 ENCOUNTER — Ambulatory Visit: Payer: Medicare Other | Attending: Family Medicine | Admitting: Pharmacist

## 2016-08-16 DIAGNOSIS — I2699 Other pulmonary embolism without acute cor pulmonale: Secondary | ICD-10-CM

## 2016-08-16 DIAGNOSIS — F17200 Nicotine dependence, unspecified, uncomplicated: Secondary | ICD-10-CM

## 2016-08-16 DIAGNOSIS — I82409 Acute embolism and thrombosis of unspecified deep veins of unspecified lower extremity: Secondary | ICD-10-CM

## 2016-08-16 LAB — POCT INR: INR: 2.3

## 2016-09-06 ENCOUNTER — Ambulatory Visit: Payer: Medicare Other | Attending: Family Medicine | Admitting: Pharmacist

## 2016-09-06 DIAGNOSIS — F17200 Nicotine dependence, unspecified, uncomplicated: Secondary | ICD-10-CM

## 2016-09-06 DIAGNOSIS — I82409 Acute embolism and thrombosis of unspecified deep veins of unspecified lower extremity: Secondary | ICD-10-CM

## 2016-09-06 DIAGNOSIS — I2699 Other pulmonary embolism without acute cor pulmonale: Secondary | ICD-10-CM

## 2016-09-06 LAB — POCT INR: INR: 5.6

## 2016-09-06 MED ORDER — PHYTONADIONE 5 MG PO TABS
2.5000 mg | ORAL_TABLET | Freq: Once | ORAL | Status: AC
  Administered 2016-09-06: 2.5 mg via ORAL

## 2016-09-06 NOTE — Progress Notes (Signed)
    Pharmacy Anticoagulation Clinic  Subjective: Patient presents today for INR monitoring. Anticoagulation indication is long term anticoagulation for pulmonary embolism and DVT.   Current dose of warfarin: 2 mg daily but 4 mg on Saturdays and Tuesdays.  Adherence to warfarin:  Signs/symptoms of bleeding: had a scrape on his arm the he had a hard time getting to stop bleeding. He also had a nosebleed but it resolved quickly. Denies any blood in urine/stool or hitting his head. Recent changes in diet: none Recent changes in medications: none Upcoming procedures that may impact anticoagulation: none   Objective: Today's INR = 5.6  Lab Results  Component Value Date   INR 5.6 09/06/2016   INR 2.3 08/16/2016   INR 2.1 08/02/2016     Assessment and Plan: Anticoagulation: Patient is Supratherapeutic based on patient's INR of 5.6 and patient's INR goal of 2-3. Will Stop current warfarin dose and recheck INR Thursday. Administered vitamin K 2.5 mg PO x 1 due to bleed/fall risk.   Patient verbalized understanding and was provided with written instructions. Next INR check planned for 09/08/16.

## 2016-09-08 ENCOUNTER — Ambulatory Visit: Payer: Medicare Other | Attending: Family Medicine | Admitting: Pharmacist

## 2016-09-08 DIAGNOSIS — I2699 Other pulmonary embolism without acute cor pulmonale: Secondary | ICD-10-CM

## 2016-09-08 DIAGNOSIS — I82409 Acute embolism and thrombosis of unspecified deep veins of unspecified lower extremity: Secondary | ICD-10-CM

## 2016-09-08 DIAGNOSIS — F17200 Nicotine dependence, unspecified, uncomplicated: Secondary | ICD-10-CM

## 2016-09-08 LAB — POCT INR: INR: 2

## 2016-09-12 MED FILL — DILTIAZEM 24HR ER 120 MG CA: 120 | 30 days supply | Qty: 30 | Fill #1

## 2016-09-12 MED FILL — WARFARIN SODIUM 2 MG TABLET: 2 | 15 days supply | Qty: 40 | Fill #1

## 2016-09-15 ENCOUNTER — Ambulatory Visit: Payer: Medicare Other | Attending: Family Medicine | Admitting: Pharmacist

## 2016-09-15 DIAGNOSIS — I82409 Acute embolism and thrombosis of unspecified deep veins of unspecified lower extremity: Secondary | ICD-10-CM

## 2016-09-15 DIAGNOSIS — I2699 Other pulmonary embolism without acute cor pulmonale: Secondary | ICD-10-CM

## 2016-09-15 DIAGNOSIS — F17200 Nicotine dependence, unspecified, uncomplicated: Secondary | ICD-10-CM

## 2016-09-15 LAB — POCT INR: INR: 2

## 2016-09-29 ENCOUNTER — Ambulatory Visit: Payer: Medicare Other | Attending: Family Medicine | Admitting: Pharmacist

## 2016-09-29 DIAGNOSIS — I2699 Other pulmonary embolism without acute cor pulmonale: Secondary | ICD-10-CM

## 2016-09-29 DIAGNOSIS — I82409 Acute embolism and thrombosis of unspecified deep veins of unspecified lower extremity: Secondary | ICD-10-CM

## 2016-09-29 DIAGNOSIS — F17200 Nicotine dependence, unspecified, uncomplicated: Secondary | ICD-10-CM

## 2016-09-29 LAB — POCT INR: INR: 3

## 2016-10-10 MED FILL — DILTIAZEM 24HR ER 120 MG CA: 120 | 30 days supply | Qty: 30 | Fill #2

## 2016-10-13 ENCOUNTER — Ambulatory Visit: Payer: Medicare Other | Attending: Family Medicine | Admitting: Pharmacist

## 2016-10-13 DIAGNOSIS — I2699 Other pulmonary embolism without acute cor pulmonale: Secondary | ICD-10-CM

## 2016-10-13 DIAGNOSIS — I82409 Acute embolism and thrombosis of unspecified deep veins of unspecified lower extremity: Secondary | ICD-10-CM

## 2016-10-13 DIAGNOSIS — F17200 Nicotine dependence, unspecified, uncomplicated: Secondary | ICD-10-CM

## 2016-10-13 LAB — POCT INR: INR: 2.3

## 2016-10-31 MED FILL — WARFARIN SODIUM 2 MG TABLET: 2 | 15 days supply | Qty: 40 | Fill #2

## 2016-11-03 ENCOUNTER — Ambulatory Visit: Payer: Medicare Other | Attending: Family Medicine | Admitting: Pharmacist

## 2016-11-03 DIAGNOSIS — I2699 Other pulmonary embolism without acute cor pulmonale: Secondary | ICD-10-CM

## 2016-11-03 DIAGNOSIS — F17200 Nicotine dependence, unspecified, uncomplicated: Secondary | ICD-10-CM

## 2016-11-03 DIAGNOSIS — I82409 Acute embolism and thrombosis of unspecified deep veins of unspecified lower extremity: Secondary | ICD-10-CM

## 2016-11-03 LAB — POCT INR: INR: 2.9

## 2016-11-10 MED FILL — DILTIAZEM 24HR ER 120 MG CA: 120 | 30 days supply | Qty: 30 | Fill #3

## 2016-12-01 ENCOUNTER — Ambulatory Visit: Payer: Medicare Other | Attending: Family Medicine | Admitting: Pharmacist

## 2016-12-01 DIAGNOSIS — I2699 Other pulmonary embolism without acute cor pulmonale: Secondary | ICD-10-CM

## 2016-12-01 DIAGNOSIS — F17200 Nicotine dependence, unspecified, uncomplicated: Secondary | ICD-10-CM

## 2016-12-01 DIAGNOSIS — I82409 Acute embolism and thrombosis of unspecified deep veins of unspecified lower extremity: Secondary | ICD-10-CM

## 2016-12-01 LAB — POCT INR: INR: 4.2

## 2016-12-06 ENCOUNTER — Ambulatory Visit: Payer: Medicare Other | Attending: Internal Medicine | Admitting: Pharmacist

## 2016-12-06 DIAGNOSIS — F17200 Nicotine dependence, unspecified, uncomplicated: Secondary | ICD-10-CM

## 2016-12-06 DIAGNOSIS — I82409 Acute embolism and thrombosis of unspecified deep veins of unspecified lower extremity: Secondary | ICD-10-CM

## 2016-12-06 DIAGNOSIS — I2699 Other pulmonary embolism without acute cor pulmonale: Secondary | ICD-10-CM

## 2016-12-06 LAB — POCT INR: INR: 3.6

## 2016-12-08 ENCOUNTER — Encounter: Payer: Medicare Other | Admitting: Pharmacist

## 2016-12-09 ENCOUNTER — Encounter: Payer: Self-pay | Admitting: Family Medicine

## 2016-12-09 ENCOUNTER — Ambulatory Visit: Payer: Medicare Other | Attending: Family Medicine

## 2016-12-09 ENCOUNTER — Other Ambulatory Visit: Payer: Self-pay

## 2016-12-09 ENCOUNTER — Ambulatory Visit (HOSPITAL_BASED_OUTPATIENT_CLINIC_OR_DEPARTMENT_OTHER): Payer: Medicare Other | Admitting: Family Medicine

## 2016-12-09 VITALS — BP 111/74 | HR 113 | Temp 98.0°F | Resp 16 | Wt 154.0 lb

## 2016-12-09 DIAGNOSIS — Z86711 Personal history of pulmonary embolism: Secondary | ICD-10-CM | POA: Insufficient documentation

## 2016-12-09 DIAGNOSIS — M25561 Pain in right knee: Secondary | ICD-10-CM

## 2016-12-09 DIAGNOSIS — R251 Tremor, unspecified: Secondary | ICD-10-CM | POA: Diagnosis not present

## 2016-12-09 DIAGNOSIS — F17210 Nicotine dependence, cigarettes, uncomplicated: Secondary | ICD-10-CM | POA: Insufficient documentation

## 2016-12-09 DIAGNOSIS — R002 Palpitations: Secondary | ICD-10-CM | POA: Diagnosis not present

## 2016-12-09 DIAGNOSIS — M25562 Pain in left knee: Secondary | ICD-10-CM | POA: Diagnosis not present

## 2016-12-09 DIAGNOSIS — D539 Nutritional anemia, unspecified: Secondary | ICD-10-CM

## 2016-12-09 DIAGNOSIS — R42 Dizziness and giddiness: Secondary | ICD-10-CM | POA: Diagnosis not present

## 2016-12-09 DIAGNOSIS — G8929 Other chronic pain: Secondary | ICD-10-CM | POA: Diagnosis not present

## 2016-12-09 DIAGNOSIS — M25552 Pain in left hip: Secondary | ICD-10-CM

## 2016-12-09 DIAGNOSIS — I824Y3 Acute embolism and thrombosis of unspecified deep veins of proximal lower extremity, bilateral: Secondary | ICD-10-CM

## 2016-12-09 DIAGNOSIS — Z1159 Encounter for screening for other viral diseases: Secondary | ICD-10-CM

## 2016-12-09 DIAGNOSIS — Z7901 Long term (current) use of anticoagulants: Secondary | ICD-10-CM

## 2016-12-09 DIAGNOSIS — I272 Pulmonary hypertension, unspecified: Secondary | ICD-10-CM | POA: Insufficient documentation

## 2016-12-09 DIAGNOSIS — R000 Tachycardia, unspecified: Secondary | ICD-10-CM | POA: Insufficient documentation

## 2016-12-09 DIAGNOSIS — I2699 Other pulmonary embolism without acute cor pulmonale: Secondary | ICD-10-CM

## 2016-12-09 DIAGNOSIS — R0602 Shortness of breath: Secondary | ICD-10-CM | POA: Insufficient documentation

## 2016-12-09 DIAGNOSIS — Z79899 Other long term (current) drug therapy: Secondary | ICD-10-CM | POA: Insufficient documentation

## 2016-12-09 LAB — COMPLETE METABOLIC PANEL WITH GFR
ALBUMIN: 3.1 g/dL — AB (ref 3.6–5.1)
ALK PHOS: 124 U/L — AB (ref 40–115)
ALT: 56 U/L — ABNORMAL HIGH (ref 9–46)
AST: 101 U/L — AB (ref 10–35)
BUN: 10 mg/dL (ref 7–25)
CO2: 24 mmol/L (ref 20–31)
Calcium: 8.1 mg/dL — ABNORMAL LOW (ref 8.6–10.3)
Chloride: 103 mmol/L (ref 98–110)
Creat: 0.61 mg/dL — ABNORMAL LOW (ref 0.70–1.25)
GFR, Est African American: >89 mL/min (ref >=60)
GFR, Est Non African American: >89 mL/min (ref >=60)
Glucose, Bld: 130 mg/dL — ABNORMAL HIGH (ref 65–99)
POTASSIUM: 3.8 mmol/L (ref 3.5–5.3)
SODIUM: 139 mmol/L (ref 135–146)
TOTAL PROTEIN: 6.4 g/dL (ref 6.1–8.1)
Total Bilirubin: 1.4 mg/dL — ABNORMAL HIGH (ref 0.2–1.2)

## 2016-12-09 LAB — T4, FREE: Free T4: 1 ng/dL (ref 0.8–1.8)

## 2016-12-09 LAB — BASIC METABOLIC PANEL WITH GFR
BUN: 11 mg/dL (ref 7–25)
CALCIUM: 8.2 mg/dL — AB (ref 8.6–10.3)
CHLORIDE: 104 mmol/L (ref 98–110)
CO2: 26 mmol/L (ref 20–31)
CREATININE: 0.59 mg/dL — AB (ref 0.70–1.25)
GFR, Est African American: >89 mL/min (ref >=60)
GFR, Est Non African American: >89 mL/min (ref >=60)
GLUCOSE: 131 mg/dL — AB (ref 65–99)
Potassium: 3.9 mmol/L (ref 3.5–5.3)
SODIUM: 139 mmol/L (ref 135–146)

## 2016-12-09 LAB — T3, FREE: T3, Free: 2.5 pg/mL (ref 2.3–4.2)

## 2016-12-09 LAB — TSH: TSH: 4.5 m[IU]/L (ref 0.40–4.50)

## 2016-12-09 LAB — POCT GLYCOSYLATED HEMOGLOBIN (HGB A1C): Hemoglobin A1C: 5.2

## 2016-12-09 MED ORDER — WARFARIN SODIUM 2 MG PO TABS: ORAL_TABLET | ORAL | 3 refills | Status: DC

## 2016-12-09 MED ORDER — DILTIAZEM HCL ER COATED BEADS 180 MG PO CP24: 180.0000 mg | ORAL_CAPSULE | Freq: Every day | ORAL | 5 refills | Status: DC

## 2016-12-09 MED ORDER — TRAMADOL HCL 50 MG PO TABS
50.0000 mg | ORAL_TABLET | Freq: Two times a day (BID) | ORAL | 2 refills | Status: DC | PRN
Start: 2016-12-09 — End: 2017-03-01

## 2016-12-09 MED FILL — WARFARIN SODIUM 2 MG TABLET: 2 | 30 days supply | Qty: 40 | Fill #0

## 2016-12-09 MED FILL — traMADol HCL 50 MG TABS: 50 | 30 days supply | Qty: 60 | Fill #0

## 2016-12-09 MED FILL — DILTIAZEM 24HR ER 180 MG CA: 180 | 30 days supply | Qty: 30 | Fill #0

## 2016-12-09 NOTE — Assessment & Plan Note (Signed)
Palpitations on diltiazem Sinus tachycardia on EKG  Plan: increase diltiazem Check Thyroid function test, CMP, CBC

## 2016-12-09 NOTE — Patient Instructions (Signed)
Patient is aware of receiving a FU call regarding results. 

## 2016-12-09 NOTE — Assessment & Plan Note (Signed)
SOB and low O2 sats noted Patient smoker Anticoagulated for PE  Plan: CBC Pulmonology referral  Smoking cessation addressed Patient declines pharmacotherapy

## 2016-12-09 NOTE — Patient Instructions (Addendum)
[name] was seen today for knee pain and tremors.  Diagnoses and all orders for this visit:  Tremor -     TSH -     T4, free -     T3, Free  Palpitations -     TSH -     T4, free -     T3, Free -     COMPLETE METABOLIC PANEL WITH GFR -     CBC -     diltiazem (CARDIZEM CD) 180 MG 24 hr capsule; Take 1 capsule (180 mg total) by mouth daily.  Chronic anticoagulation -     Cancel: INR -     warfarin (COUMADIN) 2 MG tablet; Take 1 tablet by mouth every day but 2 tablets on Tuesdays and Saturdays.  Pulmonary hypertension -     Ambulatory referral to Pulmonology -     DG Chest 2 View; Future  Tachycardia -     EKG 12-Lead  Bilateral chronic knee pain -     traMADol (ULTRAM) 50 MG tablet; Take 1 tablet (50 mg total) by mouth every 12 (twelve) hours as needed.  Left hip pain -     traMADol (ULTRAM) 50 MG tablet; Take 1 tablet (50 mg total) by mouth every 12 (twelve) hours as needed.   Smoking cessation support: smoking cessation hotline: 1-800-QUIT-NOW.  Smoking cessation classes are available through [facility]. Call [phone] or visit our website at [email or web address].   You will be contacted with lab results  Please increase diltiazem to 180 mg nightly   F/u in 1 months for tachycardia, low O2 levels and tremor   Dr. Armen PickupFunches

## 2016-12-09 NOTE — Progress Notes (Signed)
Patient denies any allergies to rubbing alcohol or latex 

## 2016-12-09 NOTE — Assessment & Plan Note (Signed)
Resting tremor  Patient with remote history of alcohol abuse  Plan: Checking TFTs If normal referral to neurology

## 2016-12-09 NOTE — Progress Notes (Signed)
Subjective:  Patient ID: Jeremy Herman, male    DOB: Jan 04, 1946  Age: 71 y.o. MRN: [id number]  CC: Follow-up and Tremors   HPI Jeremy Herman presents for   1. Tremor: noted in hands. Patient reports that was dues to cold weather. Tremor has improved. Denies family history of Parkinson's disease and neurological disorders. Reports he was previously a heavy drinker and stopped in 2001. He drank heavily from 1997-2001. Other than slight resting tremor. First 3 fingers on R hand and all of his toes tingling at times. No numbness. No weakness. He has chronic L hip pain and b/l knee pain. He walks with a walker for long distances. For short distances he can walk unassisted with her feet everted.   2. Hx of PE: on xarelto. Has CTA evidence of pulmonary HTN. Smoking 3/4 PPD. Working to cut down. Admits to SOB at times. Also lightheadedness and dizziness.   3. Joint pain: chronic pain in L hip. Both knees. L knee worse than R. L knee is medial pain. R knee is medial and lateral pain.   Social History  Substance Use Topics  . Smoking status: Current Every Day Smoker    Packs/day: 1.00    Years: 43.00    Types: Cigarettes  . Smokeless tobacco: Never Used  . Alcohol use Yes     Comment: glass of wine a day   Outpatient Medications Prior to Visit  Medication Sig Dispense Refill  . acetaminophen (TYLENOL) 500 MG tablet Take 1,000 mg by mouth every 6 (six) hours as needed for moderate pain or headache. Reported on 06/14/2016    . diltiazem (CARDIZEM CD) 120 MG 24 hr capsule Take 1 capsule (120 mg total) by mouth daily. 90 capsule 3  . warfarin (COUMADIN) 2 MG tablet Take 1 tablet by mouth every day but 2 tablets on Tuesdays and Saturdays. 40 tablet 3   No facility-administered medications prior to visit.     ROS Review of Systems  Constitutional: Negative for chills, fatigue, fever and unexpected weight change.  Eyes: Negative for visual disturbance.  Respiratory: Positive for cough  (productive AM cough ). Negative for shortness of breath.   Cardiovascular: Negative for chest pain, palpitations and leg swelling.  Gastrointestinal: Negative for abdominal pain, blood in stool, constipation, diarrhea, nausea and vomiting.  Endocrine: Negative for polydipsia, polyphagia and polyuria.  Musculoskeletal: Positive for arthralgias (knees). Negative for back pain, gait problem, myalgias and neck pain.  Skin: Negative for rash.  Allergic/Immunologic: Negative for immunocompromised state.  Neurological: Positive for dizziness, tremors and light-headedness.  Hematological: Negative for adenopathy. Does not bruise/bleed easily.  Psychiatric/Behavioral: Negative for dysphoric mood, sleep disturbance and suicidal ideas. The patient is not nervous/anxious.     Objective:  BP 111/74 (BP Location: Right Arm, Patient Position: Sitting, Cuff Size: Small)   Pulse (!) 113   Temp 98 F (36.7 C) (Oral)   Resp 16   Wt 154 lb (69.9 kg)   SpO2 92%   BMI 20.89 kg/m   BP/Weight 12/09/2016 08/05/2016 04/23/2016  Systolic BP 111 143 100  Diastolic BP 74 83 60  Wt. (Lbs) 154 149.2 -  BMI 20.89 20.24 -   Pulse Readings from Last 3 Encounters:  12/09/16 (!) 113  08/05/16 96  04/23/16 92    Physical Exam  Constitutional: He appears well-developed and well-nourished. No distress.  HENT:  Head: Normocephalic and atraumatic.  Neck: Normal range of motion. Neck supple.  Cardiovascular: Regular rhythm, normal heart sounds and intact  distal pulses.  Tachycardia present.   Pulmonary/Chest: Effort normal and breath sounds normal.  Musculoskeletal: He exhibits edema (in both legs L>R).  Neurological: He is alert. He displays tremor (resting tremor in both hands, pill rolling tremor in R hand ). No cranial nerve deficit or sensory deficit. Gait abnormal. Coordination normal.  Skin: Skin is warm and dry. No rash noted. No erythema.  Psychiatric: He has a normal mood and affect.   Lab Results    Component Value Date   INR 3.6 12/06/2016   INR 4.2 12/01/2016   INR 2.9 11/03/2016     Chemistry      Component Value Date/Time   NA 138 04/23/2016 0433   K 3.4 (L) 04/23/2016 0433   CL 106 04/23/2016 0433   CO2 26 04/23/2016 0433   BUN 10 04/23/2016 0433   CREATININE 0.49 (L) 04/23/2016 0433   CREATININE 0.53 (L) 02/09/2016 1126      Component Value Date/Time   CALCIUM 7.9 (L) 04/23/2016 0433   ALKPHOS 91 01/23/2015 1449   AST 110 (H) 01/23/2015 1449   ALT 71 (H) 01/23/2015 1449   BILITOT 0.8 01/23/2015 1449     Lab Results  Component Value Date   WBC 10.4 04/23/2016   HGB 10.8 (L) 04/23/2016   HCT 31.0 (L) 04/23/2016   MCV 115.7 (H) 04/23/2016   PLT 119 (L) 04/23/2016   EKG: nonspecific ST and T waves changes, sinus tachycardia. Unchanged from EKG done on 04/20/2016  Lab Results  Component Value Date   HGBA1C 5.2 12/09/2016     Assessment & Plan:  Jeremy Herman was seen today for knee pain and tremors.  Diagnoses and all orders for this visit:  Tremor -     TSH -     T4, free -     T3, Free -     HgB A1c  Palpitations -     TSH -     T4, free -     T3, Free -     COMPLETE METABOLIC PANEL WITH GFR -     CBC -     diltiazem (CARDIZEM CD) 180 MG 24 hr capsule; Take 1 capsule (180 mg total) by mouth daily.  Chronic anticoagulation -     Cancel: INR -     warfarin (COUMADIN) 2 MG tablet; Take 1 tablet by mouth every day but 2 tablets on Tuesdays and Saturdays.  Pulmonary hypertension -     Ambulatory referral to Pulmonology -     DG Chest 2 View; Future  Tachycardia -     EKG 12-Lead  Bilateral chronic knee pain -     traMADol (ULTRAM) 50 MG tablet; Take 1 tablet (50 mg total) by mouth every 12 (twelve) hours as needed.  Left hip pain -     traMADol (ULTRAM) 50 MG tablet; Take 1 tablet (50 mg total) by mouth every 12 (twelve) hours as needed.   There are no diagnoses linked to this encounter.  No orders of the defined types were placed in this  encounter.   Follow-up: Return in about 4 weeks (around 01/06/2017) for tremor, low O2 sats.   Dessa PhiJosalyn Jeremy Herman Canaday MD

## 2016-12-09 NOTE — Progress Notes (Signed)
Pt is in the office today for a follow up Pt states his pain level today in the office is a 7 Pt states his pain is coming from his knees Pt states he is not taking anything for the pain

## 2016-12-10 LAB — CBC
HCT: 31.6 % — ABNORMAL LOW (ref 38.5–50.0)
HEMOGLOBIN: 11.4 g/dL — AB (ref 13.2–17.1)
MCH: 43.7 pg — ABNORMAL HIGH (ref 27.0–33.0)
MCHC: 36.1 g/dL — ABNORMAL HIGH (ref 32.0–36.0)
MCV: 121.1 fL — AB (ref 80.0–100.0)
MPV: 9.4 fL (ref 7.5–12.5)
Platelets: 220 10*3/uL (ref 140–400)
RBC: 2.61 MIL/uL — ABNORMAL LOW (ref 4.20–5.80)
RDW: 17.5 % — ABNORMAL HIGH (ref 11.0–15.0)
WBC: 12 10*3/uL — ABNORMAL HIGH (ref 3.8–10.8)

## 2016-12-10 LAB — HEPATITIS C ANTIBODY: HCV Ab: NEGATIVE

## 2016-12-13 ENCOUNTER — Telehealth: Payer: Self-pay | Admitting: *Deleted

## 2016-12-13 ENCOUNTER — Ambulatory Visit: Payer: Medicare Other | Attending: Family Medicine | Admitting: Pharmacist

## 2016-12-13 DIAGNOSIS — I82409 Acute embolism and thrombosis of unspecified deep veins of unspecified lower extremity: Secondary | ICD-10-CM

## 2016-12-13 DIAGNOSIS — D539 Nutritional anemia, unspecified: Secondary | ICD-10-CM | POA: Insufficient documentation

## 2016-12-13 DIAGNOSIS — I2699 Other pulmonary embolism without acute cor pulmonale: Secondary | ICD-10-CM

## 2016-12-13 DIAGNOSIS — F17200 Nicotine dependence, unspecified, uncomplicated: Secondary | ICD-10-CM

## 2016-12-13 LAB — POCT INR: INR: 2.4

## 2016-12-13 MED ORDER — FOLIC ACID 1 MG PO TABS: 1.0000 mg | ORAL_TABLET | Freq: Every day | ORAL | 5 refills | Status: DC

## 2016-12-13 MED ORDER — THERA VITAL M PO TABS: 1.0000 | ORAL_TABLET | Freq: Every day | ORAL | 5 refills | Status: DC

## 2016-12-13 MED ORDER — VITAMIN B-1 100 MG PO TABS: 100.0000 mg | ORAL_TABLET | Freq: Every day | ORAL | 5 refills | Status: DC

## 2016-12-13 NOTE — Assessment & Plan Note (Signed)
Labs reveal normal thyroid function Slightly elevated WBC with macrocytic anemia Elevated liver enzymes with pattern of alcohol abuse  Please call patient  and assess alcohol intake in more detail Heavy alcohol intake would cause the tremor noticed and exam it can also cause liver disease (hepatitis and cirrhosis), heart disease (CHF), stomach disease (gastritis and esophagitis)  Advised to reduce alcohol intake if drinking Add thiamine and folic acid supplement along with a multivitamin for anemia  Keep f/u appt

## 2016-12-13 NOTE — Telephone Encounter (Signed)
Left message on voice mail  to call back

## 2016-12-13 NOTE — Addendum Note (Signed)
Addended by: Dessa PhiFUNCHES, Erie Radu on: 12/13/2016 02:07 PM   Modules accepted: Orders

## 2016-12-21 ENCOUNTER — Telehealth: Payer: Self-pay

## 2016-12-21 NOTE — Telephone Encounter (Signed)
Pt was called and a VM was left informing pt to return phone call for lab results. 

## 2016-12-29 ENCOUNTER — Ambulatory Visit: Payer: Medicare Other | Attending: Family Medicine | Admitting: Pharmacist

## 2016-12-29 DIAGNOSIS — F17200 Nicotine dependence, unspecified, uncomplicated: Secondary | ICD-10-CM

## 2016-12-29 DIAGNOSIS — I82409 Acute embolism and thrombosis of unspecified deep veins of unspecified lower extremity: Secondary | ICD-10-CM

## 2016-12-29 DIAGNOSIS — I2699 Other pulmonary embolism without acute cor pulmonale: Secondary | ICD-10-CM

## 2016-12-29 LAB — POCT INR: INR: 2.5

## 2017-01-10 ENCOUNTER — Observation Stay (HOSPITAL_COMMUNITY): Payer: Medicare Other

## 2017-01-10 ENCOUNTER — Emergency Department (HOSPITAL_COMMUNITY): Payer: Medicare Other

## 2017-01-10 ENCOUNTER — Encounter (HOSPITAL_COMMUNITY): Payer: Self-pay

## 2017-01-10 ENCOUNTER — Inpatient Hospital Stay (HOSPITAL_COMMUNITY)
Admission: EM | Admit: 2017-01-10 | Discharge: 2017-01-18 | DRG: 273 | Disposition: A | Discharge status: Home / Self Care | Payer: Medicare Other | Attending: Internal Medicine | Admitting: Internal Medicine

## 2017-01-10 ENCOUNTER — Observation Stay (HOSPITAL_BASED_OUTPATIENT_CLINIC_OR_DEPARTMENT_OTHER): Payer: Medicare Other

## 2017-01-10 DIAGNOSIS — L8990 Pressure ulcer of unspecified site, unspecified stage: Secondary | ICD-10-CM | POA: Diagnosis present

## 2017-01-10 DIAGNOSIS — I4891 Unspecified atrial fibrillation: Secondary | ICD-10-CM | POA: Diagnosis not present

## 2017-01-10 DIAGNOSIS — F17210 Nicotine dependence, cigarettes, uncomplicated: Secondary | ICD-10-CM | POA: Diagnosis present

## 2017-01-10 DIAGNOSIS — Z7901 Long term (current) use of anticoagulants: Secondary | ICD-10-CM

## 2017-01-10 DIAGNOSIS — Q2111 Secundum atrial septal defect: Secondary | ICD-10-CM

## 2017-01-10 DIAGNOSIS — Z800 Family history of malignant neoplasm of digestive organs: Secondary | ICD-10-CM

## 2017-01-10 DIAGNOSIS — R0602 Shortness of breath: Secondary | ICD-10-CM | POA: Diagnosis not present

## 2017-01-10 DIAGNOSIS — Z79899 Other long term (current) drug therapy: Secondary | ICD-10-CM

## 2017-01-10 DIAGNOSIS — I340 Nonrheumatic mitral (valve) insufficiency: Secondary | ICD-10-CM | POA: Diagnosis present

## 2017-01-10 DIAGNOSIS — E039 Hypothyroidism, unspecified: Secondary | ICD-10-CM | POA: Diagnosis present

## 2017-01-10 DIAGNOSIS — I270 Primary pulmonary hypertension: Secondary | ICD-10-CM | POA: Diagnosis present

## 2017-01-10 DIAGNOSIS — M1990 Unspecified osteoarthritis, unspecified site: Secondary | ICD-10-CM | POA: Diagnosis present

## 2017-01-10 DIAGNOSIS — R000 Tachycardia, unspecified: Secondary | ICD-10-CM | POA: Diagnosis present

## 2017-01-10 DIAGNOSIS — R1900 Intra-abdominal and pelvic swelling, mass and lump, unspecified site: Secondary | ICD-10-CM

## 2017-01-10 DIAGNOSIS — R188 Other ascites: Secondary | ICD-10-CM

## 2017-01-10 DIAGNOSIS — Z86718 Personal history of other venous thrombosis and embolism: Secondary | ICD-10-CM

## 2017-01-10 DIAGNOSIS — I2782 Chronic pulmonary embolism: Secondary | ICD-10-CM | POA: Diagnosis present

## 2017-01-10 DIAGNOSIS — D539 Nutritional anemia, unspecified: Secondary | ICD-10-CM | POA: Diagnosis present

## 2017-01-10 DIAGNOSIS — E869 Volume depletion, unspecified: Secondary | ICD-10-CM | POA: Diagnosis present

## 2017-01-10 DIAGNOSIS — R262 Difficulty in walking, not elsewhere classified: Secondary | ICD-10-CM

## 2017-01-10 DIAGNOSIS — Q211 Atrial septal defect: Secondary | ICD-10-CM | POA: Diagnosis not present

## 2017-01-10 DIAGNOSIS — I951 Orthostatic hypotension: Secondary | ICD-10-CM | POA: Diagnosis present

## 2017-01-10 DIAGNOSIS — E440 Moderate protein-calorie malnutrition: Secondary | ICD-10-CM | POA: Diagnosis present

## 2017-01-10 DIAGNOSIS — Z6821 Body mass index (BMI) 21.0-21.9, adult: Secondary | ICD-10-CM

## 2017-01-10 DIAGNOSIS — I2699 Other pulmonary embolism without acute cor pulmonale: Secondary | ICD-10-CM

## 2017-01-10 DIAGNOSIS — N281 Cyst of kidney, acquired: Secondary | ICD-10-CM

## 2017-01-10 DIAGNOSIS — I2510 Atherosclerotic heart disease of native coronary artery without angina pectoris: Secondary | ICD-10-CM | POA: Diagnosis present

## 2017-01-10 DIAGNOSIS — K766 Portal hypertension: Secondary | ICD-10-CM | POA: Diagnosis present

## 2017-01-10 DIAGNOSIS — I50810 Right heart failure, unspecified: Secondary | ICD-10-CM

## 2017-01-10 DIAGNOSIS — K7031 Alcoholic cirrhosis of liver with ascites: Secondary | ICD-10-CM

## 2017-01-10 DIAGNOSIS — N049 Nephrotic syndrome with unspecified morphologic changes: Secondary | ICD-10-CM | POA: Diagnosis present

## 2017-01-10 DIAGNOSIS — I480 Paroxysmal atrial fibrillation: Secondary | ICD-10-CM | POA: Diagnosis present

## 2017-01-10 HISTORY — DX: Pneumonia, unspecified organism: J18.9

## 2017-01-10 HISTORY — DX: Paroxysmal atrial fibrillation: I48.0

## 2017-01-10 LAB — LIPASE, BLOOD: Lipase: 22 U/L (ref 11–51)

## 2017-01-10 LAB — COMPREHENSIVE METABOLIC PANEL
ALBUMIN: 2.6 g/dL — AB (ref 3.5–5.0)
ALK PHOS: 109 U/L (ref 38–126)
ALT: 62 U/L (ref 17–63)
ANION GAP: 15 (ref 5–15)
AST: 114 U/L — ABNORMAL HIGH (ref 15–41)
BILIRUBIN TOTAL: 2.8 mg/dL — AB (ref 0.3–1.2)
BUN: 13 mg/dL (ref 6–20)
CALCIUM: 8.2 mg/dL — AB (ref 8.9–10.3)
CO2: 20 mmol/L — ABNORMAL LOW (ref 22–32)
Chloride: 103 mmol/L (ref 101–111)
Creatinine, Ser: 0.76 mg/dL (ref 0.61–1.24)
GFR calc Af Amer: >60 mL/min (ref >60)
GLUCOSE: 119 mg/dL — AB (ref 65–99)
Potassium: 3.8 mmol/L (ref 3.5–5.1)
Sodium: 138 mmol/L (ref 135–145)
TOTAL PROTEIN: 6.5 g/dL (ref 6.5–8.1)

## 2017-01-10 LAB — CBC WITH DIFFERENTIAL/PLATELET
BASOS PCT: 0 %
Basophils Absolute: 0 10*3/uL (ref 0.0–0.1)
EOS PCT: 0 %
Eosinophils Absolute: 0 10*3/uL (ref 0.0–0.7)
HEMATOCRIT: 32.1 % — AB (ref 39.0–52.0)
HEMOGLOBIN: 11.2 g/dL — AB (ref 13.0–17.0)
LYMPHS PCT: 8 %
Lymphs Abs: 1.3 10*3/uL (ref 0.7–4.0)
MCH: 44.8 pg — AB (ref 26.0–34.0)
MCHC: 34.9 g/dL (ref 30.0–36.0)
MCV: 128.4 fL — AB (ref 78.0–100.0)
MONOS PCT: 6 %
Monocytes Absolute: 0.9 10*3/uL (ref 0.1–1.0)
NEUTROS ABS: 13.6 10*3/uL — AB (ref 1.7–7.7)
Neutrophils Relative %: 86 %
Platelets: 201 10*3/uL (ref 150–400)
RBC: 2.5 MIL/uL — ABNORMAL LOW (ref 4.22–5.81)
RDW: 15.9 % — ABNORMAL HIGH (ref 11.5–15.5)
WBC: 15.8 10*3/uL — ABNORMAL HIGH (ref 4.0–10.5)

## 2017-01-10 LAB — ETHANOL: Alcohol, Ethyl (B): <5 mg/dL (ref <5)

## 2017-01-10 LAB — I-STAT TROPONIN, ED: Troponin i, poc: 0 ng/mL (ref 0.00–0.08)

## 2017-01-10 LAB — PROTIME-INR
INR: 3.22
Prothrombin Time: 33.7 seconds — ABNORMAL HIGH (ref 11.4–15.2)

## 2017-01-10 LAB — TSH: TSH: 5.31 u[IU]/mL — AB (ref 0.350–4.500)

## 2017-01-10 LAB — BRAIN NATRIURETIC PEPTIDE: B Natriuretic Peptide: 93.8 pg/mL (ref 0.0–100.0)

## 2017-01-10 MED ORDER — DILTIAZEM HCL ER COATED BEADS 180 MG PO CP24
180.0000 mg | ORAL_CAPSULE | Freq: Every day | ORAL | Status: DC
  Administered 2017-01-10 – 2017-01-14 (×5): 180 mg via ORAL
  Filled 2017-01-10 (×8): qty 1

## 2017-01-10 MED ORDER — METOPROLOL TARTRATE 5 MG/5ML IV SOLN: 5.0000 mg | INTRAVENOUS | Status: DC | PRN

## 2017-01-10 MED ORDER — PERFLUTREN LIPID MICROSPHERE
1.0000 mL | INTRAVENOUS | Status: AC | PRN
  Administered 2017-01-10: 2 mL via INTRAVENOUS
  Filled 2017-01-10: qty 10

## 2017-01-10 MED ORDER — SENNOSIDES-DOCUSATE SODIUM 8.6-50 MG PO TABS
1.0000 | ORAL_TABLET | Freq: Every evening | ORAL | Status: DC | PRN
  Filled 2017-01-10: qty 1

## 2017-01-10 MED ORDER — SODIUM CHLORIDE 0.9% FLUSH
3.0000 mL | Freq: Two times a day (BID) | INTRAVENOUS | Status: DC
  Administered 2017-01-10 – 2017-01-16 (×7): 3 mL via INTRAVENOUS

## 2017-01-10 MED ORDER — IOPAMIDOL (ISOVUE-370) INJECTION 76%
INTRAVENOUS | Status: AC
  Administered 2017-01-10: 100 mL via INTRAVENOUS
  Filled 2017-01-10: qty 100

## 2017-01-10 NOTE — Progress Notes (Addendum)
Pt received from ED at 1736. Oriented to room and equipment at that time.Telemetry applied, CCMD notified. Dinner ordered pr pt request. Call bell within reach, will continue to monitor.   Leonidas Rombergaitlin S Bumbledare, RN

## 2017-01-10 NOTE — ED Notes (Signed)
Pt. Reminded that we still need urine specimen. Pt. sts he needs to drink more water before he is able.

## 2017-01-10 NOTE — ED Provider Notes (Signed)
[facility]-EMERGENCY DEPT Provider Note   CSN: [id number] Arrival date & time: 01/10/17  1324     History   Chief Complaint Chief Complaint  Patient presents with  . Shortness of Breath    HPI Jeremy Herman is a 71 y.o. male.  Patient past medical history of PE, DVT, anticoagulated on warfarin presents to the emergency department with chief complaint of shortness of breath and fatigue. He states that he has had worsening symptoms over the past 3 weeks. States that he has noticed some swelling of his abdomen and swelling of his lower extremities. States that he gets short of breath to the point where he falls. He denies any fevers, chills, chest pain, or abdominal pain. There are no other associated symptoms. There are no other modifying factors.   The history is provided by the patient. No language interpreter was used.    Past Medical History:  Diagnosis Date  . Arthritis   . Chronic anticoagulation   . DVT (deep venous thrombosis) (HCC)   . Medical history non-contributory   . PE (pulmonary embolism)     Patient Active Problem List   Diagnosis Date Noted  . Macrocytic anemia 12/13/2016  . Pulmonary hypertension 12/09/2016  . Bilateral chronic knee pain 12/09/2016  . Tremor 12/09/2016  . Fracture, intertrochanteric, left femur (HCC) 04/21/2016  . Femur fracture (HCC) 04/20/2016  . Femur fracture, left (HCC) 04/20/2016  . Palpitations 02/09/2016  . Left hip pain   . Hypokalemia   . Megaloblastic anemia   . Thrombocytopenia (HCC)   . Hip fracture (HCC) 10/24/2015  . Tobacco abuse 10/24/2015  . Chronic anticoagulation 10/24/2015  . Hypertension 10/24/2015  . Hip fracture, right (HCC) 10/24/2015  . DVT, lower extremity (HCC)   . Smoker 07/08/2014  . 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 Bilateral pulmonary embolism (HCC) 09/17/2013  . DVT, bilateral lower limbs (HCC) 09/17/2013    Past Surgical History:  Procedure Laterality Date  . ANKLE SURGERY  1979   right ankle  .  FEMUR IM NAIL Left 04/21/2016   Procedure: INTRAMEDULLARY (IM) NAIL LEFT  FEMORAL;  Surgeon: Samson Frederic, MD;  Location: [facility];  Service: Orthopedics;  Laterality: Left;       Home Medications    Prior to Admission medications   Medication Sig Start Date End Date Taking? Authorizing Provider  diltiazem (CARDIZEM CD) 180 MG 24 hr capsule Take 1 capsule (180 mg total) by mouth daily. 12/09/16   Dessa Phi, MD  folic acid (FOLVITE) 1 MG tablet Take 1 tablet (1 mg total) by mouth daily. 12/13/16   Dessa Phi, MD  Multiple Vitamins-Minerals (MULTIVITAMIN) tablet Take 1 tablet by mouth daily. 12/13/16   Josalyn Funches, MD  thiamine (VITAMIN B-1) 100 MG tablet Take 1 tablet (100 mg total) by mouth daily. 12/13/16   Josalyn Funches, MD  traMADol (ULTRAM) 50 MG tablet Take 1 tablet (50 mg total) by mouth every 12 (twelve) hours as needed. 12/09/16   Dessa Phi, MD  warfarin (COUMADIN) 2 MG tablet Take 1 tablet by mouth every day but 2 tablets on Tuesdays and Saturdays. 12/09/16   Dessa Phi, MD    Family History Family History  Problem Relation Age of Onset  . Stomach cancer Father   . Cancer Mother   . Aneurysm      Social History Social History  Substance Use Topics  . Smoking status: Current Every Day Smoker    Packs/day: 1.00    Years: 43.00    Types:  Cigarettes  . Smokeless tobacco: Never Used  . Alcohol use Yes     Comment: glass of wine a day     Allergies   Fish allergy   Review of Systems Review of Systems  Respiratory: Positive for shortness of breath.   Cardiovascular: Positive for leg swelling.  Gastrointestinal: Positive for abdominal distention.  All other systems reviewed and are negative.    Physical Exam Updated Vital Signs BP 104/71   Pulse 99   Temp 97.8 F (36.6 C) (Oral)   Resp 26   Ht 6' (1.829 m)   Wt 69.9 kg   SpO2 96%   BMI 20.89 kg/m   Physical Exam  Constitutional: He is oriented to person, place, and time.  Thin,  frail appearing  HENT:  Head: Normocephalic and atraumatic.  Eyes: Conjunctivae and EOM are normal. Pupils are equal, round, and reactive to light. Right eye exhibits no discharge. Left eye exhibits no discharge. No scleral icterus.  Neck: Normal range of motion. Neck supple. No JVD present.  Cardiovascular: Normal rate, regular rhythm and normal heart sounds.  Exam reveals no gallop and no friction rub.   No murmur heard. Pulmonary/Chest: Effort normal and breath sounds normal. No respiratory distress. He has no wheezes. He has no rales. He exhibits no tenderness.  Abdominal: Soft. He exhibits distension. He exhibits no mass. There is no tenderness. There is no rebound and no guarding.  No focal abdominal tenderness, no RLQ tenderness or pain at McBurney's point, no RUQ tenderness or Murphy's sign, no left-sided abdominal tenderness, no fluid wave, or signs of peritonitis   Musculoskeletal: Normal range of motion. He exhibits edema. He exhibits no tenderness.  2+ pitting edema  Neurological: He is alert and oriented to person, place, and time.  Skin: Skin is warm and dry.  Psychiatric: He has a normal mood and affect. His behavior is normal. Judgment and thought content normal.  Nursing note and vitals reviewed.    ED Treatments / Results  Labs (all labs ordered are listed, but only abnormal results are displayed) Labs Reviewed  CBC WITH DIFFERENTIAL/PLATELET - Abnormal; Notable for the following:       Result Value   WBC 15.8 (*)    RBC 2.50 (*)    Hemoglobin 11.2 (*)    HCT 32.1 (*)    MCV 128.4 (*)    MCH 44.8 (*)    RDW 15.9 (*)    Neutro Abs 13.6 (*)    All other components within normal limits  COMPREHENSIVE METABOLIC PANEL - Abnormal; Notable for the following:    CO2 20 (*)    Glucose, Bld 119 (*)    Calcium 8.2 (*)    Albumin 2.6 (*)    AST 114 (*)    Total Bilirubin 2.8 (*)    All other components within normal limits  PROTIME-INR - Abnormal; Notable for the  following:    Prothrombin Time 33.7 (*)    All other components within normal limits  LIPASE, BLOOD  BRAIN NATRIURETIC PEPTIDE  URINALYSIS, ROUTINE W REFLEX MICROSCOPIC  I-STAT TROPOININ, ED    EKG  EKG Interpretation None       Radiology Dg Chest Port 1 View  Result Date: 01/10/2017 CLINICAL DATA:  Shortness of Breath EXAM: PORTABLE CHEST 1 VIEW COMPARISON:  04/20/2016 FINDINGS: Heart and mediastinal contours are within normal limits. No focal opacities or effusions. No acute bony abnormality. IMPRESSION: No active disease. Electronically Signed   By: Charlett Nose  M.D.   On: 01/10/2017 10:53    Procedures Procedures (including critical care time)  Medications Ordered in ED Medications - No data to display   Initial Impression / Assessment and Plan / ED Course  I have reviewed the triage vital signs and the nursing notes.  Pertinent labs & imaging results that were available during my care of the patient were reviewed by me and considered in my medical decision making (see chart for details).     Patient with acute on chronic shortness of breath.  Pitting edema in lower extremities, mild distention of the abdomen. Vital signs are stable. Patient is not hypoxic. He does have a history of PE and DVT, but is adequately anticoagulated with warfarin. Today's INR is 3.2.  Patient Seen by and discussed with Dr. Patria Maneampos, who ambulates patient, but the patient cannot ambulate more and a few steps without becoming very short of breath. Suspicion for PE or DVT remains low given adequate anticoagulation. Consider thyroid versus pulmonary hypertension, patient may benefit from an echocardiogram.  Discussed with internal medicine teaching service, who will admit the patient.  Final Clinical Impressions(s) / ED Diagnoses   Final diagnoses:  SOB (shortness of breath)    New Prescriptions New Prescriptions   No medications on file     Roxy HorsemanRobert Brendaly Townsel, PA-C 01/10/17 1318      Azalia BilisKevin Campos, MD 01/11/17 [phone]

## 2017-01-10 NOTE — ED Notes (Signed)
Asked pt to void for sample

## 2017-01-10 NOTE — Progress Notes (Signed)
ANTICOAGULATION CONSULT NOTE - Initial Consult  Pharmacy Consult for warfarin Indication: Hx of DVT/PE  Allergies  Allergen Reactions  . Fish Allergy Anaphylaxis and Other (See Comments)    Diarrhea     Patient Measurements: Height: 6' (182.9 cm) Weight: 154 lb (69.9 kg) IBW/kg (Calculated) : 77.6  Vital Signs: Temp: 97.8 F (36.6 C) (02/13 0945) Temp Source: Oral (02/13 0945) BP: 120/80 (02/13 1300) Pulse Rate: 101 (02/13 1300)  Labs:  Recent Labs  01/10/17 1028  HGB 11.2*  HCT 32.1*  PLT 201  LABPROT 33.7*  INR 3.22  CREATININE 0.76    Estimated Creatinine Clearance: 86.2 mL/min (by C-G formula based on SCr of 0.76 mg/dL).   Medical History: Past Medical History:  Diagnosis Date  . Arthritis   . Chronic anticoagulation   . DVT (deep venous thrombosis) (HCC)   . Medical history non-contributory   . PE (pulmonary embolism)     Assessment: [age] yoM presents with SOB and swelling of lower extremities. Pt has Hx of DVT/PE and is on warfarin PTA to resume per pharmacy. INR supratherapeutic at 3.22 on admit, plt wnl, Hgb slightly low but appears at baseline. CT pending.  PTA Warfarin Dose = 1mg  Tues/Thurs/Sat, 2mg  Mon/Wed/Fri/Sun. Last reported dose was 2/12.  Goal of Therapy:  INR 2-3 Monitor platelets by anticoagulation protocol: Yes   Plan:  -Hold warfarin tonight -Daily INR -Follow-up CT imaging  Fredonia HighlandMichael Byron Peacock, PharmD PGY-1 Pharmacy Resident Pager: [phone] 01/10/2017

## 2017-01-10 NOTE — H&P (Signed)
Date: 01/10/2017               Patient Name:  Jeremy Herman MRN: [id number]  DOB: Oct 04, 1946 Age / Sex: 71 y.o., male   PCP: Dessa Phi, MD         Medical Service: Internal Medicine Teaching Service         Attending Physician: Dr. Doneen Poisson, MD    First Contact: Dr. Nelson Chimes Pager: [phone]  Second Contact: Dr. Allena Katz Pager: [phone]       After Hours (After 5p/  First Contact Pager: [phone]  weekends / holidays): Second Contact Pager: [phone]   Chief Complaint: Worsening shortness of breath and bilateral leg and abdominal swelling.  History of Present Illness: Jeremy Herman is a 71 y.o. male.With past medical history significant for  paroxysmal atrial fibrillation and [name], is on chronic anticoagulation came to ED with complaint of worsening shortness of breath lightheadedness and dizziness with ambulation, along with bilateral leg swelling and abdominal swelling for past 3 weeks.  Patient states that he was in his usual state of health approximately 3 weeks ago, when he started noticing some shortness of breath, lightheadedness and dizziness and bilateral swelling of his leg and abdomen. His shortness of breath has worsened to the point that he can hardly walk 8-10 feet without being short of breath.  Patient denies any headache, change in vision, upper respiratory symptoms, chest pains, palpitations, orthopnea or PND. He denies any recent sick contacts.  He do endorse decrease in his appetite or past 6 weeks, but denies any unintentional weight loss. He states that his weight during his previous visit with PCP 1 month ago was 154 pounds. Denies any nausea, vomiting, constipation, diarrhea, dysuria, blood in stool or urine. He do endorse feeling Herman cold than usual since the beginning of winter.  Meds:  Current Meds  Medication Sig  . diltiazem (CARDIZEM CD) 180 MG 24 hr capsule Take 1 capsule (180 mg total) by mouth daily.  . traMADol (ULTRAM) 50 MG  tablet Take 1 tablet (50 mg total) by mouth every 12 (twelve) hours as needed.  . warfarin (COUMADIN) 2 MG tablet Take 1 tablet by mouth every day but 2 tablets on Tuesdays and Saturdays. (Patient taking differently: Take 1-2 mg by mouth See admin instructions. 1mg  On Tuesday, Thursday, Saturday - pt takes 2mg  on Sunday, Monday, Wednesday, Friday)     Allergies: Allergies as of 01/10/2017 - Review Complete 01/10/2017  Allergen Reaction Noted  . Fish allergy Anaphylaxis and Other (See Comments) 09/17/2013   Past Medical History:  Diagnosis Date  . Arthritis   . Chronic anticoagulation   . DVT (deep venous thrombosis) (HCC)   . Medical history non-contributory   . PE (pulmonary embolism)     Family History: Mom died of brain aneurysm and father died of stomach cancer at the age of 63. Brother also has chronic blood clots.  Social History: Lifelong smoker with 1 pack per day, recently decreased to 5 cigarettes per day and stop 3 days ago. He states that he quit drinking 16 years ago, he used to drink a lot, unable to obtain exact amount.  Review of Systems: A complete ROS was negative except as per HPI.   Physical Exam: Blood pressure 101/63, pulse 104, temperature 97.8 F (36.6 C), temperature source Oral, resp. rate 19, height 6' (1.829 m), weight 154 lb (69.9 kg), SpO2 98 %. Vitals:   01/10/17 1330 01/10/17 1400 01/10/17 1430  01/10/17 1736  BP: 118/78 103/70 101/63 (!) 167/97  Pulse: 109 99 104 100  Resp: 23 18 19 18   Temp:      TempSrc:      SpO2: 97% 100% 98% 98%  Weight:      Height:       General: Vital signs reviewed.  Well-developed , thin man, no acute distress and cooperative with exam.  Head: Normocephalic and atraumatic. Eyes: EOMI, conjunctivae normal, no scleral icterus.  Neck: Supple, trachea midline, normal ROM, no JVD, masses, thyromegaly, or carotid bruit present.  Cardiovascular: RRR, S1 normal, S2 normal, no murmurs, gallops, or rubs. Pulmonary/Chest:  Clear to auscultation bilaterally, no wheezes, rales, or rhonchi. Abdominal: Soft, non-tender, distended, BS +, no masses, organomegaly, or guarding present.  Musculoskeletal: No joint deformities, erythema, or stiffness, ROM full and nontender. Extremities: Bilateral pitting edema up to below the knees, mild left calf tenderness. Neurological: A&O x3, Strength is normal and symmetric bilaterally, cranial nerve II-XII are grossly intact, no focal motor deficit, sensory intact to light touch bilaterally.  Skin: Warm, dry and intact. Multiple ecchymosis on all extremities. Psychiatric: Normal mood and affect. speech and behavior is normal. Cognition and memory are normal.  Labs. CBC    Component Value Date/Time   WBC 15.8 (H) 01/10/2017 1028   RBC 2.50 (L) 01/10/2017 1028   HGB 11.2 (L) 01/10/2017 1028   HCT 32.1 (L) 01/10/2017 1028   PLT 201 01/10/2017 1028   MCV 128.4 (H) 01/10/2017 1028   MCH 44.8 (H) 01/10/2017 1028   MCHC 34.9 01/10/2017 1028   RDW 15.9 (H) 01/10/2017 1028   LYMPHSABS 1.3 01/10/2017 1028   MONOABS 0.9 01/10/2017 1028   EOSABS 0.0 01/10/2017 1028   BASOSABS 0.0 01/10/2017 1028   CMP Latest Ref Rng & Units 01/10/2017 12/09/2016 12/09/2016  Glucose 65 - 99 mg/dL 409(W119(H) 119(J130(H) 478(G131(H)  BUN 6 - 20 mg/dL 13 10 11   Creatinine 0.61 - 1.24 mg/dL 9.560.76 2.13(Y0.61(L) 8.65(H0.59(L)  Sodium 135 - 145 mmol/L 138 139 139  Potassium 3.5 - 5.1 mmol/L 3.8 3.8 3.9  Chloride 101 - 111 mmol/L 103 103 104  CO2 22 - 32 mmol/L 20(L) 24 26  Calcium 8.9 - 10.3 mg/dL 8.2(L) 8.1(L) 8.2(L)  Total Protein 6.5 - 8.1 g/dL 6.5 6.4 -  Total Bilirubin 0.3 - 1.2 mg/dL 2.8(H) 1.4(H) -  Alkaline Phos 38 - 126 U/L 109 124(H) -  AST 15 - 41 U/L 114(H) 101(H) -  ALT 17 - 63 U/L 62 56(H) -   LIpase. 22 BNP. 93.8 Trop. 0.00 TSH. 5.310 Ethanol. <5  EKG: Sinus tachycardia with few premature ventricular contractions.  CXR: FINDINGS: Heart and mediastinal contours are within normal limits. No focal opacities  or effusions. No acute bony abnormality.  IMPRESSION: No active disease.  CTA. COMPARISON:  07/08/2014  FINDINGS: Cardiovascular: Improving clot burden within the pulmonary arteries. There is eccentric chronic clot within the left lower lobe segmental pulmonary artery, best seen on image 51 of series 4. Minimal residual filling defect noted within the right lower lobe pulmonary arterial branch on image 55. Note new areas of thromboembolism. Heart is normal size. Aorta is normal caliber. Extensive diffuse coronary artery calcifications and scattered aortic calcifications.  Mediastinum/Nodes: No mediastinal, hilar, or axillary adenopathy.  Lungs/Pleura: Nodular area in the right upper lobe on image 41 is stable since prior study and compatible with scarring. Scarring in the right lower lobe, stable. No new airspace opacities or effusions.  Upper Abdomen: Severe diffuse  low-density throughout the liver compatible with fatty infiltration. Ascites noted adjacent to the liver and spleen.  Musculoskeletal: Chest wall soft tissues are unremarkable. No acute bony abnormality.  Review of the MIP images confirms the above findings.  IMPRESSION: Partial resolution of the previously seen chronic pulmonary emboli. Mild residual eccentric chronic emboli noted in both lower lobes. No new/ acute pulmonary emboli.  Advanced coronary artery disease.  Severe low-density throughout the liver, likely fatty infiltration. Upper abdominal ascites adjacent to the liver and spleen.  US abdomen. FINDINGS: Gallbladder: No gallstones are noted within gallbladder. There is some layering gallbladder sludge. No thickening of gallbladder wall. No sonographic Murphy's sign.  Common bile duct: Diameter: 3.5 mm in diameter within normal limits.  Liver: No focal hepatic mass. There is diffuse increased echogenicity of the liver suspicious for fatty infiltration or chronic liver  disease.  IVC: Not well seen due to bowel gas and abdominal ascites.  Pancreas: Not well seen due to abdominal ascites.  Spleen: Size and appearance within normal limits. Measures 7 cm in length.  Right Kidney: Length: 13.5 cm in length. No hydronephrosis or renal calculi. There is a complex possible cystic hypoechoic exophytic lesion in midpole of the left kidney measures 2.2 x 2.2 cm further correlation with enhanced CT or MRI is recommended to exclude a complex enhancing lesion.  Left Kidney: Length: 12.7 cm. No hydronephrosis or renal calculi. A cyst in midpole measures 1.8 x 1.2 cm.  Abdominal aorta: No aneurysm visualized. Measures up to 2.4 cm in diameter.  Other findings: Abdominal ascites is noted.  IMPRESSION: 1. No gallstones are noted within gallbladder. Small layering gallbladder sludge. Normal CBD. No sonographic Murphy's sign. 2. Heterogeneous increased echogenicity of the liver which may be due to fatty infiltration or chronic liver disease. 3. No hydronephrosis. There is a complex exophytic lesion midpole of the right kidney measures 2.2 x 2.2 cm. Further evaluation with enhanced CT or MRI is recommended to exclude complex cystic neoplasm. 4. No left hydronephrosis. A cyst in midpole of the left kidney measures 1.8 x 1.2 cm. 5. No aortic aneurysm.  Abdominal ascites is noted.   Assessment & Plan by Problem:  Jeremy Herman is a 71 y.o. male.With past medical history significant for  paroxysmal atrial fibrillation and [name], is on chronic anticoagulation came to ED with complaint of worsening shortness of breath lightheadedness and dizziness with ambulation, along with bilateral leg swelling and abdominal swelling for past 3 weeks.  Differential diagnosis.   Recurrent PE. His well's score is 3.0-moderate risk and modified Geneva was 13-high risk group. He is on Coumadin with supratherapeutic INR.  CTA was negative for any new  pulmonary embolism, positive for chronic PE.  Malignancy. His history of progressively worsening abdominal swelling and mild distention on exam, concerning for ascites. Abdominal ultrasound came back positive for ascites along with Heterogeneous increased echogenicity of the liver which may be due to fatty infiltration or chronic liver disease. And  a complex exophytic lesion midpole of the right kidney measures 2.2 x 2.2 cm. -He needs further evaluation with paracentesis and CT abdomen. -His hyperbilirubinemia and mildly elevated ALT also need further evaluation.  Right heart failure. Can be a possibility because of his chronic pulmonary embolism, can lead to pulmonary hypertension and right heart failure. Although less likely, no JVD or gallop on exam. -Echo was ordered in ED, results pending.  Nephrotic syndrome. This also and less likely possibility. -Check UA   Elevated TSH. His  TSH was elevated at 5.310. -Check free T4 and T3.  H/O PAF. Currently sinus rhythm. -On Coumadin with INR of 3.22.  DVT prophylaxis. Coumadin Diet. Heart healthy CODE STATUS. Full  Dispo: Admit patient to Observation with expected length of stay less than 2 midnights.  Signed: [name], MD 01/10/2017, 4:15 PM  Pager: [phone]

## 2017-01-10 NOTE — ED Triage Notes (Signed)
Pt. Coming from home via [facility] for abdomen and bilateral ankle edema as well as shortness of breath x3weeks. Pt. Reports only medical hx of afib and dvt, which he is on warfarin for. Pt. Denies liver problems or alcohol use. Pt. Denies any pain.

## 2017-01-10 NOTE — Progress Notes (Signed)
  Echocardiogram 2D Echocardiogram with Definity has been performed.  Jeremy Herman, Jeremy Herman 01/10/2017, 4:21 PM

## 2017-01-10 NOTE — ED Notes (Signed)
Placed patient into a gown and on the monitor waiting on provider 

## 2017-01-10 NOTE — H&P (Signed)
Date: 01/10/2017               Patient Name:  Jeremy Herman MRN: [id number]  DOB: [birth year] Age / Sex: 71 y.o., male   PCP: Dessa Phi, MD         Medical Service: Internal Medicine Teaching Service         Attending Physician: Dr. Doneen Poisson, MD    First Contact: Dr. Nelson Chimes Pager: [phone]  Second Contact: Dr. Allena Katz Pager: [phone]       After Hours (After 5p/  First Contact Pager: [phone]  weekends / holidays): Second Contact Pager: [phone]   Chief Complaint: SOB, abdomen and leg swelling  History of Present Illness: Jeremy Herman is a 71 y.o. male with a h/o PE, DVT, and arthritis presents with SOB and abdominal and leg swelling. He states that symptoms started 3 weeks ago. SOB has worsened over the past few weeks. 3 weeks ago it would occur while walking around the grocery store. Currently, he can walk 10 ft and he will have SOB. Patient endorses being able to lay flat and denies any orthopnea.   Patient reports a history of DVTs, but notes that his leg swelling has been worse than normal. Both the leg and abdominal swelling occurred at the time that SOB started. Patient reports minimal pain in the left lower quadrant of abdomen. Patient endorses normal bowel habits and normal urinary habits. He has had a lousy appetite for the past 6 weeks but denies any nausea or vomiting.   Patient denies any recent sick contacts, fevers, chills, and chest pain.   Meds:  Current Meds  Medication Sig  . diltiazem (CARDIZEM CD) 180 MG 24 hr capsule Take 1 capsule (180 mg total) by mouth daily.  . traMADol (ULTRAM) 50 MG tablet Take 1 tablet (50 mg total) by mouth every 12 (twelve) hours as needed.  . warfarin (COUMADIN) 2 MG tablet Take 1 tablet by mouth every day but 2 tablets on Tuesdays and Saturdays. (Patient taking differently: Take 1-2 mg by mouth See admin instructions. 1mg  On Tuesday, Thursday, Saturday - pt takes 2mg  on Sunday, Monday, Wednesday, Friday)     Allergies: Allergies as of 01/10/2017 - Review Complete 01/10/2017  Allergen Reaction Noted  . Fish allergy Anaphylaxis and Other (See Comments) 09/17/2013   Past Medical History:  Diagnosis Date  . Arthritis   . Chronic anticoagulation   . DVT (deep venous thrombosis) (HCC)   . Medical history non-contributory   . PE (pulmonary embolism)     Family History:  Family History  Problem Relation Age of Onset  . Stomach cancer Father   . Cancer Mother   . Aneurysm Mother   . Deep vein thrombosis Brother 68    Social History:  Social History   Social History  . Marital status: Married    Spouse name: N/A  . Number of children: N/A  . Years of education: N/A   Occupational History  . Not on file.   Social History Main Topics  . Smoking status: Current Every Day Smoker    Packs/day: 1.00    Years: 43.00    Types: Cigarettes  . Smokeless tobacco: Never Used     Comment: Quit 3 days ago  . Alcohol use No     Comment: Former extensive use; Quit ~16 years ago  . Drug use: No  . Sexual activity: Not Currently   Other Topics Concern  . Not on file  Social History Narrative  . No narrative on file    Review of Systems: A complete ROS was negative except as per HPI.   Physical Exam: Blood pressure 120/80, pulse 101, temperature 97.8 F (36.6 C), temperature source Oral, resp. rate 25, height 6' (1.829 m), weight 69.9 kg (154 lb), SpO2 93 %.  Physical Exam  Constitutional: He is oriented to person, place, and time. He appears well-developed and well-nourished.  HENT:  Head: Normocephalic and atraumatic.  Eyes: No scleral icterus.  Neck: Normal range of motion. Neck supple.  Cardiovascular: Normal rate, regular rhythm, normal heart sounds and intact distal pulses.   Pulmonary/Chest: Effort normal and breath sounds normal.  Abdominal: Soft. Bowel sounds are normal. He exhibits distension.  Very mild tenderness on the LLQ  Musculoskeletal:  2+ pitting edema   Lymphadenopathy:    He has no cervical adenopathy.  Neurological: He is alert and oriented to person, place, and time.  Skin: Skin is warm and dry.   UA: Pending  CBC Latest Ref Rng & Units 01/10/2017 12/09/2016 04/23/2016  WBC 4.0 - 10.5 K/uL 15.8(H) 12.0(H) 10.4  Hemoglobin 13.0 - 17.0 g/dL 11.2(L) 11.4(L) 10.8(L)  Hematocrit 39.0 - 52.0 % 32.1(L) 31.6(L) 31.0(L)  Platelets 150 - 400 K/uL 201 220 119(L)   CMP Latest Ref Rng & Units 01/10/2017 12/09/2016 12/09/2016  Glucose 65 - 99 mg/dL 409(W) 119(J) 478(G)  BUN 6 - 20 mg/dL 13 10 11   Creatinine 0.61 - 1.24 mg/dL 9.56 2.13(Y) 8.65(H)  Sodium 135 - 145 mmol/L 138 139 139  Potassium 3.5 - 5.1 mmol/L 3.8 3.8 3.9  Chloride 101 - 111 mmol/L 103 103 104  CO2 22 - 32 mmol/L 20(L) 24 26  Calcium 8.9 - 10.3 mg/dL 8.2(L) 8.1(L) 8.2(L)  Total Protein 6.5 - 8.1 g/dL 6.5 6.4 -  Total Bilirubin 0.3 - 1.2 mg/dL 2.8(H) 1.4(H) -  Alkaline Phos 38 - 126 U/L 109 124(H) -  AST 15 - 41 U/L 114(H) 101(H) -  ALT 17 - 63 U/L 62 56(H) -   Lipase     Component Value Date/Time   LIPASE 22 01/10/2017 1028   PT/INR  Ref Range & Units   Prothrombin Time 11.4 - 15.2 seconds 33.7    INR  3.22    BNP (last 3 results)  Recent Labs  01/10/17 1029  BNP 93.8   Troponin (Point of Care Test)  Recent Labs  01/10/17 1033  TROPIPOC 0.00   TSH  Ref Range & Units   TSH 0.350 - 4.500 uIU/mL 5.310      EKG: sinus tachycardia with PVCs, low voltage precordial leads  CXR: No cardiomegaly. No opacities or effusions.   Assessment & Plan by Problem: Active Problems:   Shortness of breath  Jeremy Herman is a 71 y.o. male with a h/o PE, DVT, and arthritis presents with SOB and abdominal and leg swelling. Symptoms concerning for another PE vs. Liver disease  SOB & Swelling  Patient has abdominal distention and leg edema. CXR is negative for cardiomegaly, opacities, and effusion. Based off HPI symptoms are concerning for another PE vs. Liver disease.  Patient mentioned that this did not feel like his PE before. The history of the SOB makes this seem less likely as it seems more chronic. Abdominal swelling and leg edema, along with history of alcohol abuse is concerning for liver disease. However, only AST is elevated. There is no scleral icterus. Could not appreciate hepatomegaly or fluid wave on physical exam. Will  order an abdominal U/S to confirm abdominal ascites. Given history of PE and some symptoms concerning for it, will order CTA. -- CTA today -- Abdominal U/S -- Echo -- CMET in AM   Dispo: Admit patient to Inpatient with expected length of stay greater than 2 midnights.  Signed: Caryn BeeJohn A Kaylor Simenson, Medical Student 01/10/2017, 2:49 PM  Pager: [phone]

## 2017-01-11 ENCOUNTER — Observation Stay (HOSPITAL_COMMUNITY): Payer: Medicare Other

## 2017-01-11 DIAGNOSIS — R946 Abnormal results of thyroid function studies: Secondary | ICD-10-CM

## 2017-01-11 DIAGNOSIS — I2782 Chronic pulmonary embolism: Secondary | ICD-10-CM | POA: Diagnosis not present

## 2017-01-11 DIAGNOSIS — I2699 Other pulmonary embolism without acute cor pulmonale: Secondary | ICD-10-CM

## 2017-01-11 DIAGNOSIS — F17211 Nicotine dependence, cigarettes, in remission: Secondary | ICD-10-CM | POA: Diagnosis not present

## 2017-01-11 DIAGNOSIS — I480 Paroxysmal atrial fibrillation: Secondary | ICD-10-CM | POA: Diagnosis present

## 2017-01-11 DIAGNOSIS — K7031 Alcoholic cirrhosis of liver with ascites: Secondary | ICD-10-CM | POA: Diagnosis present

## 2017-01-11 DIAGNOSIS — Z8249 Family history of ischemic heart disease and other diseases of the circulatory system: Secondary | ICD-10-CM

## 2017-01-11 DIAGNOSIS — Z800 Family history of malignant neoplasm of digestive organs: Secondary | ICD-10-CM

## 2017-01-11 DIAGNOSIS — R188 Other ascites: Secondary | ICD-10-CM

## 2017-01-11 DIAGNOSIS — I509 Heart failure, unspecified: Secondary | ICD-10-CM | POA: Diagnosis not present

## 2017-01-11 DIAGNOSIS — D539 Nutritional anemia, unspecified: Secondary | ICD-10-CM | POA: Diagnosis present

## 2017-01-11 DIAGNOSIS — N049 Nephrotic syndrome with unspecified morphologic changes: Secondary | ICD-10-CM | POA: Diagnosis present

## 2017-01-11 DIAGNOSIS — I2724 Chronic thromboembolic pulmonary hypertension: Secondary | ICD-10-CM | POA: Diagnosis not present

## 2017-01-11 DIAGNOSIS — F17210 Nicotine dependence, cigarettes, uncomplicated: Secondary | ICD-10-CM | POA: Diagnosis present

## 2017-01-11 DIAGNOSIS — I951 Orthostatic hypotension: Secondary | ICD-10-CM | POA: Diagnosis not present

## 2017-01-11 DIAGNOSIS — E039 Hypothyroidism, unspecified: Secondary | ICD-10-CM | POA: Diagnosis present

## 2017-01-11 DIAGNOSIS — K7460 Unspecified cirrhosis of liver: Secondary | ICD-10-CM | POA: Diagnosis not present

## 2017-01-11 DIAGNOSIS — Z7901 Long term (current) use of anticoagulants: Secondary | ICD-10-CM | POA: Diagnosis not present

## 2017-01-11 DIAGNOSIS — N281 Cyst of kidney, acquired: Secondary | ICD-10-CM | POA: Diagnosis present

## 2017-01-11 DIAGNOSIS — R778 Other specified abnormalities of plasma proteins: Secondary | ICD-10-CM | POA: Diagnosis not present

## 2017-01-11 DIAGNOSIS — E869 Volume depletion, unspecified: Secondary | ICD-10-CM | POA: Diagnosis present

## 2017-01-11 DIAGNOSIS — K769 Liver disease, unspecified: Secondary | ICD-10-CM

## 2017-01-11 DIAGNOSIS — Z9889 Other specified postprocedural states: Secondary | ICD-10-CM | POA: Diagnosis not present

## 2017-01-11 DIAGNOSIS — Z79899 Other long term (current) drug therapy: Secondary | ICD-10-CM | POA: Diagnosis not present

## 2017-01-11 DIAGNOSIS — R000 Tachycardia, unspecified: Secondary | ICD-10-CM | POA: Diagnosis present

## 2017-01-11 DIAGNOSIS — Z6821 Body mass index (BMI) 21.0-21.9, adult: Secondary | ICD-10-CM | POA: Diagnosis not present

## 2017-01-11 DIAGNOSIS — N289 Disorder of kidney and ureter, unspecified: Secondary | ICD-10-CM | POA: Diagnosis not present

## 2017-01-11 DIAGNOSIS — I270 Primary pulmonary hypertension: Secondary | ICD-10-CM | POA: Diagnosis present

## 2017-01-11 DIAGNOSIS — Q211 Atrial septal defect: Secondary | ICD-10-CM | POA: Diagnosis not present

## 2017-01-11 DIAGNOSIS — L8990 Pressure ulcer of unspecified site, unspecified stage: Secondary | ICD-10-CM | POA: Diagnosis present

## 2017-01-11 DIAGNOSIS — Z91013 Allergy to seafood: Secondary | ICD-10-CM

## 2017-01-11 DIAGNOSIS — E440 Moderate protein-calorie malnutrition: Secondary | ICD-10-CM | POA: Diagnosis present

## 2017-01-11 DIAGNOSIS — I340 Nonrheumatic mitral (valve) insufficiency: Secondary | ICD-10-CM | POA: Diagnosis not present

## 2017-01-11 DIAGNOSIS — R0602 Shortness of breath: Secondary | ICD-10-CM | POA: Diagnosis present

## 2017-01-11 DIAGNOSIS — F1021 Alcohol dependence, in remission: Secondary | ICD-10-CM | POA: Diagnosis not present

## 2017-01-11 DIAGNOSIS — I50810 Right heart failure, unspecified: Secondary | ICD-10-CM | POA: Diagnosis present

## 2017-01-11 DIAGNOSIS — R740 Nonspecific elevation of levels of transaminase and lactic acid dehydrogenase [LDH]: Secondary | ICD-10-CM | POA: Diagnosis not present

## 2017-01-11 DIAGNOSIS — Z86718 Personal history of other venous thrombosis and embolism: Secondary | ICD-10-CM | POA: Diagnosis not present

## 2017-01-11 DIAGNOSIS — I272 Pulmonary hypertension, unspecified: Secondary | ICD-10-CM | POA: Diagnosis not present

## 2017-01-11 DIAGNOSIS — K766 Portal hypertension: Secondary | ICD-10-CM | POA: Diagnosis present

## 2017-01-11 DIAGNOSIS — E038 Other specified hypothyroidism: Secondary | ICD-10-CM | POA: Diagnosis not present

## 2017-01-11 DIAGNOSIS — Z832 Family history of diseases of the blood and blood-forming organs and certain disorders involving the immune mechanism: Secondary | ICD-10-CM

## 2017-01-11 DIAGNOSIS — M1990 Unspecified osteoarthritis, unspecified site: Secondary | ICD-10-CM | POA: Diagnosis present

## 2017-01-11 DIAGNOSIS — I2510 Atherosclerotic heart disease of native coronary artery without angina pectoris: Secondary | ICD-10-CM | POA: Diagnosis not present

## 2017-01-11 LAB — BODY FLUID CELL COUNT WITH DIFFERENTIAL
Eos, Fluid: 0 %
LYMPHS FL: 15 %
Monocyte-Macrophage-Serous Fluid: 79 % (ref 50–90)
Neutrophil Count, Fluid: 6 % (ref 0–25)
WBC FLUID: 85 uL (ref 0–1000)

## 2017-01-11 LAB — COMPREHENSIVE METABOLIC PANEL
ALBUMIN: 2.5 g/dL — AB (ref 3.5–5.0)
ALT: 57 U/L (ref 17–63)
AST: 94 U/L — AB (ref 15–41)
Alkaline Phosphatase: 99 U/L (ref 38–126)
Anion gap: 13 (ref 5–15)
BILIRUBIN TOTAL: 2.1 mg/dL — AB (ref 0.3–1.2)
BUN: 12 mg/dL (ref 6–20)
CO2: 23 mmol/L (ref 22–32)
Calcium: 8 mg/dL — ABNORMAL LOW (ref 8.9–10.3)
Chloride: 100 mmol/L — ABNORMAL LOW (ref 101–111)
Creatinine, Ser: 0.71 mg/dL (ref 0.61–1.24)
GFR calc Af Amer: >60 mL/min (ref >60)
GFR calc non Af Amer: >60 mL/min (ref >60)
GLUCOSE: 109 mg/dL — AB (ref 65–99)
POTASSIUM: 3.8 mmol/L (ref 3.5–5.1)
Sodium: 136 mmol/L (ref 135–145)
TOTAL PROTEIN: 5.8 g/dL — AB (ref 6.5–8.1)

## 2017-01-11 LAB — CBC
HEMATOCRIT: 28.6 % — AB (ref 39.0–52.0)
Hemoglobin: 9.9 g/dL — ABNORMAL LOW (ref 13.0–17.0)
MCH: 43.4 pg — AB (ref 26.0–34.0)
MCHC: 34.6 g/dL (ref 30.0–36.0)
MCV: 125.4 fL — ABNORMAL HIGH (ref 78.0–100.0)
Platelets: 188 10*3/uL (ref 150–400)
RBC: 2.28 MIL/uL — ABNORMAL LOW (ref 4.22–5.81)
RDW: 15.4 % (ref 11.5–15.5)
WBC: 13.5 10*3/uL — ABNORMAL HIGH (ref 4.0–10.5)

## 2017-01-11 LAB — HIV ANTIBODY (ROUTINE TESTING W REFLEX): HIV Screen 4th Generation wRfx: NONREACTIVE

## 2017-01-11 LAB — PROTIME-INR
INR: 2.84
Prothrombin Time: 30.4 seconds — ABNORMAL HIGH (ref 11.4–15.2)

## 2017-01-11 LAB — APTT: aPTT: 48 seconds — ABNORMAL HIGH (ref 24–36)

## 2017-01-11 LAB — T4, FREE: Free T4: 0.96 ng/dL (ref 0.61–1.12)

## 2017-01-11 LAB — ECHOCARDIOGRAM COMPLETE
Height: 72 in
WEIGHTICAEL: 2464 [oz_av]

## 2017-01-11 MED ORDER — LIDOCAINE HCL 1 % IJ SOLN
INTRAMUSCULAR | Status: AC
  Filled 2017-01-11: qty 20

## 2017-01-11 MED ORDER — WARFARIN - PHARMACIST DOSING INPATIENT: Freq: Every day | Status: DC

## 2017-01-11 MED ORDER — WARFARIN SODIUM 3 MG PO TABS
1.5000 mg | ORAL_TABLET | Freq: Once | ORAL | Status: AC
  Administered 2017-01-11: 1.5 mg via ORAL
  Filled 2017-01-11: qty 2

## 2017-01-11 NOTE — Progress Notes (Signed)
   Subjective: No acute events overnight. Patient endorses same shortness of breath as yesterday. Patient asked questions about plan today and we answered appropriately.   Objective:  Vital signs in last 24 hours: Vitals:   01/11/17 1038 01/11/17 1044 01/11/17 1208 01/11/17 1242  BP: 103/66 97/66 (!) 90/57 99/64  Pulse:   (!) 105 99  Resp:    17  Temp:      TempSrc:      SpO2:    97%  Weight:      Height:       Physical Exam  Constitutional: He is oriented to person, place, and time. He appears well-developed and well-nourished.  Cardiovascular: Normal rate, regular rhythm and normal heart sounds.  Exam reveals no gallop and no friction rub.   No murmur heard. Pulmonary/Chest: Effort normal and breath sounds normal. He has no wheezes. He has no rales.  Abdominal: Soft. Bowel sounds are normal. He exhibits distension. There is no tenderness.  Musculoskeletal:  Bilateral edema. Left leg noticeably more edematous than right.   Neurological: He is alert and oriented to person, place, and time.   CBC    Component Value Date/Time   WBC 13.5 (H) 01/11/2017 0230   RBC 2.28 (L) 01/11/2017 0230   HGB 9.9 (L) 01/11/2017 0230   HCT 28.6 (L) 01/11/2017 0230   PLT 188 01/11/2017 0230   MCV 125.4 (H) 01/11/2017 0230   MCH 43.4 (H) 01/11/2017 0230   MCHC 34.6 01/11/2017 0230   RDW 15.4 01/11/2017 0230   LYMPHSABS 1.3 01/10/2017 1028   MONOABS 0.9 01/10/2017 1028   EOSABS 0.0 01/10/2017 1028   BASOSABS 0.0 01/10/2017 1028   CMP Latest Ref Rng & Units 01/11/2017 01/10/2017 12/09/2016  Glucose 65 - 99 mg/dL 161(W109(H) 960(A119(H) 540(J130(H)  BUN 6 - 20 mg/dL 12 13 10   Creatinine 0.61 - 1.24 mg/dL 8.110.71 9.140.76 7.82(N0.61(L)  Sodium 135 - 145 mmol/L 136 138 139  Potassium 3.5 - 5.1 mmol/L 3.8 3.8 3.8  Chloride 101 - 111 mmol/L 100(L) 103 103  CO2 22 - 32 mmol/L 23 20(L) 24  Calcium 8.9 - 10.3 mg/dL 8.0(L) 8.2(L) 8.1(L)  Total Protein 6.5 - 8.1 g/dL 5.6(O5.8(L) 6.5 6.4  Total Bilirubin 0.3 - 1.2 mg/dL 2.1(H)  2.8(H) 1.4(H)  Alkaline Phos 38 - 126 U/L 99 109 124(H)  AST 15 - 41 U/L 94(H) 114(H) 101(H)  ALT 17 - 63 U/L 57 62 56(H)    Assessment/Plan:  Active Problems:   Chronic pulmonary embolism (HCC)   Ascites   71 y.o. male with h/o paroxysmal AFib, [name],who presented with worsening shortness of breath lightheadedness and dizziness with ambulation, along with bilateral leg swelling and abdominal swelling for past 3 weeks.  SOB & Swelling Patient has three main pathology on the differential for combined SOB and swelling liver disease, pulmonary HTN, or malignancy. Our plan is to get a paracentesis as the SAAG score will help us what would be best to work up as well as get fluid off the patient to help him breath better. -- cont home Coumadin -- paracentesis today -- f/u paracentesis results  Elevated TSH  His TSH was elevated at 5.310. Free T4 was normal. Subclinical hypothyroidism. No need to treat.   Fluids: None Diet: Heart Healthy DVT Prophylaxis: Coumadin Code Status: FULL  Dispo: Anticipated discharge pending clinical course  Caryn BeeJohn A Maybelline Kolarik, Medical Student 01/11/2017, 2:41 PM Pager: [phone]

## 2017-01-11 NOTE — Progress Notes (Signed)
PT Cancellation Note  Patient Details Name: Jeremy PaganiniJames Hefter MRN: [id number] DOB: [birth year]   Cancelled Treatment:    Reason Eval/Treat Not Completed: Patient at procedure or test/unavailable.  Will try back later as pt and time allow.   Ivar DrapeRuth E Murtaza Shell 01/11/2017, 11:22 AM   Samul Dadauth Jehieli Brassell, PT MS Acute Rehab Dept. Number: [facility] [id number] and [facility] [phone]

## 2017-01-11 NOTE — H&P (Signed)
Internal Medicine Attending Admission Note Date: 01/11/2017  Patient name: Ryun Velez Medical record number: [id number] Date of birth: Nov 14, 1946 Age: 71 y.o. Gender: male  I saw and evaluated the patient. I reviewed the resident's note and I agree with the resident's findings and plan as documented in the resident's note.  Chief Complaint(s): Progressive shortness of breath and abdominal swelling 3 months.  History - key components related to admission:  Mr. 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 He states that the abdominal swelling came on all of a sudden 3 weeks ago. This shortness of breath has been slowly progressive over that time and he is severely dyspneic with minimal exertion. He denies any fevers, shakes, chills, chest pain, palpitations, orthopnea, or paroxysmal nocturnal dyspnea. He was admitted to the internal medicine teaching service for further evaluation and care.  Physical Exam - key components related to admission:  Vitals:   01/11/17 1038 01/11/17 1044 01/11/17 1208 01/11/17 1242  BP: 103/66 97/66 (!) 90/57 99/64  Pulse:   (!) 105 99  Resp:    17  Temp:      TempSrc:      SpO2:    97%  Weight:      Height:       Gen.: Well-developed, poorly nourished, chronically ill-appearing man sitting comfortably in bed in no acute distress. Heart: No right ventricular heave, regular rate and rhythm without murmurs, rubs, or gallops. I could not appreciate an accentuated P2. Abdomen: Distended, soft, nontender Extremities: Bilateral lower extremity edema.  Lab results:  Basic Metabolic Panel:  Recent Labs  40/98/11 1028 01/11/17 0230  NA 138 136  K 3.8 3.8  CL 103 100*  CO2 20* 23  GLUCOSE 119*  109*  BUN 13 12  CREATININE 0.76 0.71  CALCIUM 8.2* 8.0*   Liver Function Tests:  Recent Labs  01/10/17 1028 01/11/17 0230  AST 114* 94*  ALT 62 57  ALKPHOS 109 99  BILITOT 2.8* 2.1*  PROT 6.5 5.8*  ALBUMIN 2.6* 2.5*    Recent Labs  01/10/17 1028  LIPASE 22   CBC:  Recent Labs  01/10/17 1028 01/11/17 0230  WBC 15.8* 13.5*  NEUTROABS 13.6*  --   HGB 11.2* 9.9*  HCT 32.1* 28.6*  MCV 128.4* 125.4*  PLT 201 188   Thyroid Function Tests:  Recent Labs  01/10/17 1251 01/11/17 0230  TSH 5.310*  --   FREET4  --  0.96   Coagulation:  Recent Labs  01/10/17 1028 01/11/17 0230  INR 3.22 2.84   Alcohol Level:  Recent Labs  01/10/17 1659  ETH <5   Misc. Labs:  BNP 93.8 HIV antibody nonreactive Ascitic fluid hazy, yellow, white blood cell count 85, 6% neutrophils, 15% lymphocytes, 79% monocytes. Other ascitic fluid studies are pending at this time.  Imaging results:  X-ray Chest Pa And Lateral  Result Date: 01/11/2017 CLINICAL DATA:  Shortness of breath for 4 weeks EXAM: CHEST  2 VIEW COMPARISON:  01/10/2017 FINDINGS: Cardiomediastinal silhouette is stable. No infiltrate or pleural effusion. No pulmonary edema. Again noted degenerative changes thoracic spine. IMPRESSION: No active cardiopulmonary disease. Electronically Signed   By: Natasha Mead M.D.   On: 01/11/2017 11:21   Ct Angio Chest Pe W Or Wo Contrast  Result Date: 01/10/2017 CLINICAL DATA:  Bilateral leg swelling, shortness of breath. EXAM: CT ANGIOGRAPHY CHEST WITH CONTRAST TECHNIQUE: Multidetector CT imaging of the chest was performed using the standard protocol during bolus administration of intravenous contrast. Multiplanar CT image reconstructions and MIPs were obtained to evaluate the vascular anatomy. CONTRAST:  70 cc Isovue 370 IV COMPARISON:  07/08/2014 FINDINGS: Cardiovascular: Improving clot burden within the pulmonary arteries. There is eccentric chronic clot within the left lower lobe  segmental pulmonary artery, best seen on image 51 of series 4. Minimal residual filling defect noted within the right lower lobe pulmonary arterial branch on image 55. Note new areas of thromboembolism. Heart is normal size. Aorta is normal caliber. Extensive diffuse coronary artery calcifications and scattered aortic calcifications. Mediastinum/Nodes: No mediastinal, hilar, or axillary adenopathy. Lungs/Pleura: Nodular area in the right upper lobe on image 41 is stable since prior study and compatible with scarring. Scarring in the right lower lobe, stable. No new airspace opacities or effusions. Upper Abdomen: Severe diffuse low-density throughout the liver compatible with fatty infiltration. Ascites noted adjacent to the liver and spleen. Musculoskeletal: Chest wall soft tissues are unremarkable. No acute bony abnormality. Review of the MIP images confirms the above findings. IMPRESSION: Partial resolution of the previously seen chronic pulmonary emboli. Mild residual eccentric chronic emboli noted in both lower lobes. No new/ acute pulmonary emboli. Advanced coronary artery disease. Severe low-density throughout the liver, likely fatty infiltration. Upper abdominal ascites adjacent to the liver and spleen. Electronically Signed   By: Charlett Nose M.D.   On: 01/10/2017 16:47   US Abdomen Complete  Result Date: 01/10/2017 CLINICAL DATA:  Abdominal swelling for 3 weeks EXAM: ABDOMEN ULTRASOUND COMPLETE COMPARISON:  None. FINDINGS: Gallbladder: No gallstones are noted within gallbladder. There is some layering gallbladder sludge. No thickening of gallbladder wall. No sonographic Murphy's sign. Common bile duct: Diameter: 3.5 mm in diameter within normal limits. Liver: No focal hepatic mass. There is diffuse increased echogenicity of the liver suspicious for fatty infiltration or chronic liver disease. IVC: Not well seen due to bowel gas and abdominal ascites. Pancreas: Not well seen due to abdominal ascites.  Spleen: Size and appearance within normal limits. Measures 7 cm in length. Right Kidney: Length: 13.5 cm in length. No hydronephrosis or renal calculi. There is a complex possible cystic hypoechoic exophytic lesion in midpole of the left kidney measures 2.2 x 2.2 cm further correlation with enhanced CT or MRI is recommended to exclude a complex enhancing lesion. Left Kidney: Length: 12.7 cm. No hydronephrosis or renal calculi. A cyst in midpole measures 1.8 x 1.2 cm. Abdominal aorta: No aneurysm visualized. Measures up to 2.4 cm in diameter. Other findings: Abdominal ascites is noted. IMPRESSION: 1. No gallstones are noted within gallbladder. Small layering gallbladder sludge. Normal CBD. No sonographic Murphy's sign. 2. Heterogeneous increased echogenicity of the liver which may be due to fatty infiltration or chronic liver disease. 3. No hydronephrosis. There is a complex exophytic lesion midpole of the right kidney measures 2.2 x 2.2 cm. Further evaluation with enhanced CT or MRI is recommended to exclude complex cystic neoplasm. 4. No left hydronephrosis. A cyst in midpole of the left kidney measures 1.8 x 1.2 cm. 5. No aortic aneurysm.  Abdominal ascites is noted. Electronically Signed   By: Natasha Mead M.D.   On: 01/10/2017 16:06   US Paracentesis  Result Date: 01/11/2017 INDICATION: Ascites of unknown etiology. Request is made for diagnostic and therapeutic paracentesis. EXAM: ULTRASOUND GUIDED DIAGNOSTIC AND THERAPEUTIC PARACENTESIS MEDICATIONS: 1% lidocaine COMPLICATIONS: None  immediate. PROCEDURE: Informed written consent was obtained from the patient after a discussion of the risks, benefits and alternatives to treatment. A timeout was performed prior to the initiation of the procedure. Initial ultrasound scanning demonstrates a moderate amount of ascites within the right lower abdominal quadrant. The right lower abdomen was prepped and draped in the usual sterile fashion. 1% lidocaine was used for  local anesthesia. Following this, a 19 gauge, 7-cm, Yueh catheter was introduced. An ultrasound image was saved for documentation purposes. The paracentesis was performed. The catheter was removed and a dressing was applied. The patient tolerated the procedure well without immediate post procedural complication. FINDINGS: A total of approximately 2.7 L of yellow fluid was removed. Samples were sent to the laboratory as requested by the clinical team. IMPRESSION: Successful ultrasound-guided paracentesis yielding 2.7 liters of peritoneal fluid. Read by: Barnetta Chapel, PA-C Electronically Signed   By: Judie Petit.  Shick M.D.   On: 01/11/2017 11:38   Dg Chest Port 1 View  Result Date: 01/10/2017 CLINICAL DATA:  Shortness of Breath EXAM: PORTABLE CHEST 1 VIEW COMPARISON:  04/20/2016 FINDINGS: Heart and mediastinal contours are within normal limits. No focal opacities or effusions. No acute bony abnormality. IMPRESSION: No active disease. Electronically Signed   By: Charlett Nose M.D.   On: 01/10/2017 10:53   Other results:  EKG: Normal sinus rhythm at 92 bpm, low voltage, 1 PVC, normal axis, normal intervals, no LVH by voltage, no significant Q waves, no ST or T wave changes. No significant changes from the previous ECG on 12/09/2016.  Assessment & Plan by Problem:  Mr. 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 CT angiogram revealed no acute pulmonary embolism but evidence of chronic pulmonary venous thromboembolism. An ultrasound of the abdomen revealed a possible cystic hypoechoic exophytic lesion in the midpole of the left kidney measuring 2.2 x 2.2 cm in addition to ascites and possible fatty liver disease. Thus, his severe dyspnea on exertion may be  secondary to chronic pulmonary venous thromboembolism with resultant pulmonary hypertension, right ventricular failure, and ascites versus ascites secondary to chronic liver disease or a malignant left renal lesion.  1) Chronic pulmonary venous thromboembolism: He will be maintained on his anticoagulation and currently is therapeutic. We are waiting the results of the echocardiogram that was obtained to assess for evidence of severe pulmonary hypertension and left ventricular failure. If this demonstrates significant pulmonary hypertension we may proceed to right heart catheterization to confirm the diagnosis. If he has significant pulmonary hypertension secondary to chronic pulmonary venous thromboembolism I am not sure he is a good candidate for a pulmonary thrombectomy given his poor functional status and multiple comorbid conditions. We will readdress that should this be felt to be the cause of his symptoms. Finally, we are awaiting the results of the ascitic fluid analysis, and particularly the SAAG which could suggest a transudative process such as pulmonary hypertension.  2) Chronic liver disease: If he is found to have pulmonary hypertension on echocardiogram and undergoes a right heart catheterization we may also discovered this pulmonary hypertension could be secondary to his chronic liver disease. Unfortunately, the only therapy for this at this time is liver transplantation. Again I am concerned about his candidacy for such a procedure. While in the hospital we will also assess for  platypnea and orthodeoxia in case pulmonary AVMs related to his chronic liver disease are playing a role in his dyspnea. We are waiting the results of the ascitic fluid analysis, and particularly the SAAG which could suggest a transudate of process such as portal hypertension.  3) Possible cystic hypoechoic exophytic lesion in the midpole of the left kidney: This requires further evaluation. If the SAAG suggests an  exudative lesion and the cytology is positive we will have a diagnosis that requires further intervention. If the SAAG is consistent with a transudative process we will proceed with either a contrast-enhanced CT scan of the kidney or MRI to further characterize the lesion.  4) Disposition: Once we have a better idea of the cause of his dyspnea and ascites we will be able to initiate a directed treatment plan. Getting to this point will likely take at least another 24 hours of inpatient care.

## 2017-01-11 NOTE — Procedures (Signed)
Ultrasound-guided diagnostic and therapeutic paracentesis performed yielding 2.7 liters of yellow colored fluid. No immediate complications.  Jeremy Herman E 11:35 AM 01/11/2017

## 2017-01-11 NOTE — Evaluation (Signed)
Occupational Therapy Evaluation Patient Details Name: Jeremy Herman MRN: [id number] DOB: 1946-10-24 Today's Date: 01/11/2017    History of Present Illness 71 y.o. male.With past medical history significant for  paroxysmal atrial fibrillation and [name], is on chronic anticoagulation came to ED with complaint of worsening shortness of breath lightheadedness and dizziness with ambulation, along with bilateral leg swelling and abdominal swelling for past 3 weeks.   Clinical Impression   Pt reports he was mod I with ADL PTA and performed functional mobility with RW. Pt currently requires min guard assist for ADL and functional mobility with increased SOB and dizziness during functional activities. Began education on energy conservation and pursed lip breathing. Pt planning to d/c home alone with intermittent supervision from neighbors as needed. Pt would benefit from continued skilled OT to address established goals.     Follow Up Recommendations  No OT follow up;Supervision - Intermittent    Equipment Recommendations  Tub/shower seat    Recommendations for Other Services PT consult     Precautions / Restrictions Precautions Precautions: Fall Restrictions Weight Bearing Restrictions: No      Mobility Bed Mobility               General bed mobility comments: Pt sitting EOB upon arrival  Transfers Overall transfer level: Needs assistance Equipment used: Rolling walker (2 wheeled) Transfers: Sit to/from Stand Sit to Stand: Min guard         General transfer comment: for safety, no physical assist. Pt with good hand placement and technique with RW    Balance Overall balance assessment: Needs assistance Sitting-balance support: Feet supported;No upper extremity supported Sitting balance-Leahy Scale: Good     Standing balance support: Bilateral upper extremity supported Standing balance-Leahy Scale: Poor Standing balance comment: RW for support                             ADL Overall ADL's : Needs assistance/impaired Eating/Feeding: Independent;Sitting   Grooming: Set up;Supervision/safety;Sitting   Upper Body Bathing: Set up;Supervision/ safety;Sitting   Lower Body Bathing: Min guard;Sit to/from stand   Upper Body Dressing : Set up;Supervision/safety;Sitting   Lower Body Dressing: Min guard;Sit to/from stand Lower Body Dressing Details (indicate cue type and reason): pt able to adjust socks sitting EOB Toilet Transfer: Min guard;Ambulation;BSC;RW Toilet Transfer Details (indicate cue type and reason): Simulated by sit to stand from chair with functional mobility in room         Functional mobility during ADLs: Min guard;Rolling walker General ADL Comments: Pt with SOB during activity and dizziness; educated on pursed lip breathing strategies. Pt noted to have bil LE edema; educated on ROM and elevation, pt properly positioned in recliner chair at end of session.     Vision Vision Assessment?: No apparent visual deficits   Perception     Praxis      Pertinent Vitals/Pain Pain Assessment: No/denies pain     Hand Dominance Right   Extremity/Trunk Assessment Upper Extremity Assessment Upper Extremity Assessment: Generalized weakness   Lower Extremity Assessment Lower Extremity Assessment: Defer to PT evaluation   Cervical / Trunk Assessment Cervical / Trunk Assessment: Normal   Communication Communication Communication: No difficulties   Cognition Arousal/Alertness: Awake/alert Behavior During Therapy: WFL for tasks assessed/performed Overall Cognitive Status: Within Functional Limits for tasks assessed                     General Comments  Exercises       Shoulder Instructions      Home Living Family/patient expects to be discharged to:: Private residence Living Arrangements: Alone Available Help at Discharge: Friend(s);Available PRN/intermittently Type of Home:  Apartment Home Access: Level entry     Home Layout: One level     Bathroom Shower/Tub: Tub/shower unit Shower/tub characteristics: Curtain FirefighterBathroom Toilet: Standard     Home Equipment: Environmental consultantWalker - 2 wheels;Bedside commode;Grab bars - tub/shower          Prior Functioning/Environment Level of Independence: Independent with assistive device(s)        Comments: RW for all mobility, drives, independent with ADL        OT Problem List: Decreased strength;Decreased activity tolerance;Impaired balance (sitting and/or standing);Decreased knowledge of use of DME or AE;Cardiopulmonary status limiting activity   OT Treatment/Interventions: Self-care/ADL training;Energy conservation;DME and/or AE instruction;Therapeutic activities;Patient/family education;Balance training    OT Goals(Current goals can be found in the care plan section) Acute Rehab OT Goals Patient Stated Goal: get better and return home OT Goal Formulation: With patient Time For Goal Achievement: 01/25/17 Potential to Achieve Goals: Good ADL Goals Pt Will Perform Upper Body Bathing: with modified independence;standing Pt Will Perform Lower Body Bathing: with modified independence Pt Will Transfer to Toilet: with modified independence;ambulating;bedside commode (over toilet) Pt Will Perform Toileting - Clothing Manipulation and hygiene: with modified independence;sit to/from stand Pt Will Perform Tub/Shower Transfer: Tub transfer;with modified independence;ambulating;grab bars;shower seat Additional ADL Goal #1: Pt will independently verbally recall 3 energy conservation strategies.  OT Frequency: Min 2X/week   Barriers to D/C: Decreased caregiver support  pt lives alone       Co-evaluation              End of Session Equipment Utilized During Treatment: Engineer, waterolling walker Nurse Communication: Mobility status  Activity Tolerance: Patient tolerated treatment well Patient left: in chair;with call bell/phone  within reach   Time: 1540-1557 OT Time Calculation (min): 17 min Charges:  OT General Charges $OT Visit: 1 Procedure OT Evaluation $OT Eval Moderate Complexity: 1 Procedure G-Codes: OT G-codes **NOT FOR INPATIENT CLASS** Functional Assessment Tool Used: Clinical judgement Functional Limitation: Self care Self Care Current Status (W0981(G8987): At least 1 percent but less than 20 percent impaired, limited or restricted Self Care Goal Status (X9147(G8988): At least 1 percent but less than 20 percent impaired, limited or restricted   Gaye AlkenBailey A Brax Walen M.S., OTR/L Pager: [phone]  01/11/2017, 4:10 PM

## 2017-01-11 NOTE — Care Management Obs Status (Signed)
MEDICARE OBSERVATION STATUS NOTIFICATION   Patient Details  Name: Jeremy Herman MRN: [id number] Date of Birth: [birth year]   Medicare Observation Status Notification Given:  Yes    Darrold SpanWebster, Brieanne Mignone Hall, RN 01/11/2017, 4:31 PM

## 2017-01-11 NOTE — Progress Notes (Signed)
ANTICOAGULATION CONSULT NOTE - FOLLOW UP  Pharmacy Consult:  Coumadin Indication: History of DVT/PE  Allergies  Allergen Reactions  . Fish Allergy Anaphylaxis and Other (See Comments)    Diarrhea     Patient Measurements: Height: 6' (182.9 cm) Weight: 154 lb (69.9 kg) IBW/kg (Calculated) : 77.6  Vital Signs: Temp: 97.7 F (36.5 C) (02/14 0554) Temp Source: Oral (02/14 0554) BP: 94/59 (02/14 0554) Pulse Rate: 97 (02/14 0554)  Labs:  Recent Labs  01/10/17 1028 01/11/17 0230  HGB 11.2* 9.9*  HCT 32.1* 28.6*  PLT 201 188  APTT  --  48*  LABPROT 33.7* 30.4*  INR 3.22 2.84  CREATININE 0.76 0.71    Estimated Creatinine Clearance: 86.2 mL/min (by C-G formula based on SCr of 0.71 mg/dL).    Assessment: 70 YOM on Coumadin PTA for history of PE/DVT presented with SOB and swelling of lower extremities. CTA showed partial resolution of chronic PE, negative for new PE.    INR decreased to therapeutic level after Coumadin was held yesterday.  No bleeding reported.  Noted documentation of ascites and possible chronic liver disease.  Home Coumadin regimen: 2mg  daily except 1mg  on TTS   Goal of Therapy:  INR 2-3    Plan:  - Coumadin 1.5mg  PO today - Daily PT / INR - F/U ECHO - Consider adding hold parameter for diltiazem   Kindra Bickham D. Laney Potashang, PharmD, BCPS Pager:  [phone] 01/11/2017, 8:55 AM

## 2017-01-11 NOTE — Progress Notes (Signed)
   Subjective: Patient was feeling the same this morning, no new complaint.  Objective:  Vital signs in last 24 hours: Vitals:   01/11/17 1038 01/11/17 1044 01/11/17 1208 01/11/17 1242  BP: 103/66 97/66 (!) 90/57 99/64  Pulse:   (!) 105 99  Resp:    17  Temp:      TempSrc:      SpO2:    97%  Weight:      Height:       Gen. Well-built developed, little emaciated man, in no acute distress. Chest. Clear bilaterally. CVS. Regular rate and rhythm, no rub/gallops/murmur. Abdomen. Soft, distended, mild tenderness at left lower quadrant, no rebound or guarding, bowel sounds positive. Extremities. Bilateral pitting edema up to below knees, more pronounced on left lower extremity with some erythema, mild left calf tenderness, pulses intact and symmetrical bilaterally.  Assessment/Plan:  Jeremy BaneJames Shamlinis a 71 y.o.male.With past medical history significant for  paroxysmal atrial fibrillation and [name], is on chronic anticoagulation came to ED with complaint of worsening shortness of breath lightheadedness and dizziness with ambulation, along with bilateral leg swelling and abdominal swelling for past 3 weeks.  Chronic PE. No new PE on CTA. Worsening swelling of left lower leg. -Continue Coumadin. -We are not doing any further investigation for DVT as it will not change the current management.  Ascites. Waiting for his paracentesis fluid lab results to guide our further workup.  Subclinical hypothyroidism. No need for any intervention at this time.  H/O PAF. Currently sinus rhythm. -Continue Coumadin.  Dispo: Anticipated discharge in approximately 1-2 day(s).   Arnetha CourserSumayya Naveena Eyman, MD 01/11/2017, 3:18 PM Pager: [phone]

## 2017-01-12 ENCOUNTER — Inpatient Hospital Stay (HOSPITAL_COMMUNITY): Payer: Medicare Other

## 2017-01-12 ENCOUNTER — Encounter (HOSPITAL_COMMUNITY): Payer: Self-pay | Admitting: Radiology

## 2017-01-12 DIAGNOSIS — Q2111 Secundum atrial septal defect: Secondary | ICD-10-CM

## 2017-01-12 DIAGNOSIS — K7031 Alcoholic cirrhosis of liver with ascites: Secondary | ICD-10-CM

## 2017-01-12 DIAGNOSIS — Q211 Atrial septal defect: Secondary | ICD-10-CM

## 2017-01-12 DIAGNOSIS — I270 Primary pulmonary hypertension: Secondary | ICD-10-CM

## 2017-01-12 DIAGNOSIS — I509 Heart failure, unspecified: Secondary | ICD-10-CM

## 2017-01-12 DIAGNOSIS — K7460 Unspecified cirrhosis of liver: Secondary | ICD-10-CM

## 2017-01-12 DIAGNOSIS — Z9889 Other specified postprocedural states: Secondary | ICD-10-CM

## 2017-01-12 DIAGNOSIS — I50810 Right heart failure, unspecified: Secondary | ICD-10-CM

## 2017-01-12 DIAGNOSIS — N281 Cyst of kidney, acquired: Secondary | ICD-10-CM

## 2017-01-12 DIAGNOSIS — I2724 Chronic thromboembolic pulmonary hypertension: Secondary | ICD-10-CM

## 2017-01-12 DIAGNOSIS — E038 Other specified hypothyroidism: Secondary | ICD-10-CM

## 2017-01-12 LAB — COMPREHENSIVE METABOLIC PANEL
ALT: 53 U/L (ref 17–63)
ANION GAP: 8 (ref 5–15)
AST: 89 U/L — ABNORMAL HIGH (ref 15–41)
Albumin: 2.3 g/dL — ABNORMAL LOW (ref 3.5–5.0)
Alkaline Phosphatase: 89 U/L (ref 38–126)
BUN: 11 mg/dL (ref 6–20)
CHLORIDE: 100 mmol/L — AB (ref 101–111)
CO2: 26 mmol/L (ref 22–32)
Calcium: 7.9 mg/dL — ABNORMAL LOW (ref 8.9–10.3)
Creatinine, Ser: 0.56 mg/dL — ABNORMAL LOW (ref 0.61–1.24)
Glucose, Bld: 132 mg/dL — ABNORMAL HIGH (ref 65–99)
POTASSIUM: 3.5 mmol/L (ref 3.5–5.1)
Sodium: 134 mmol/L — ABNORMAL LOW (ref 135–145)
Total Bilirubin: 1.7 mg/dL — ABNORMAL HIGH (ref 0.3–1.2)
Total Protein: 5.3 g/dL — ABNORMAL LOW (ref 6.5–8.1)

## 2017-01-12 LAB — CBC
HEMATOCRIT: 28.2 % — AB (ref 39.0–52.0)
Hemoglobin: 9.9 g/dL — ABNORMAL LOW (ref 13.0–17.0)
MCH: 43.2 pg — ABNORMAL HIGH (ref 26.0–34.0)
MCHC: 35.1 g/dL (ref 30.0–36.0)
MCV: 123.1 fL — AB (ref 78.0–100.0)
Platelets: 142 10*3/uL — ABNORMAL LOW (ref 150–400)
RBC: 2.29 MIL/uL — AB (ref 4.22–5.81)
RDW: 15.2 % (ref 11.5–15.5)
WBC: 9.5 10*3/uL (ref 4.0–10.5)

## 2017-01-12 LAB — RETICULOCYTES
RBC.: 2.57 MIL/uL — AB (ref 4.22–5.81)
RETIC COUNT ABSOLUTE: 64.3 10*3/uL (ref 19.0–186.0)
Retic Ct Pct: 2.5 % (ref 0.4–3.1)

## 2017-01-12 LAB — LACTATE DEHYDROGENASE, PLEURAL OR PERITONEAL FLUID: LD, Fluid: 44 U/L — ABNORMAL HIGH (ref 3–23)

## 2017-01-12 LAB — FOLATE: Folate: 2.9 ng/mL — ABNORMAL LOW (ref >5.9)

## 2017-01-12 LAB — GLUCOSE, PLEURAL OR PERITONEAL FLUID: GLUCOSE FL: 117 mg/dL

## 2017-01-12 LAB — IRON AND TIBC
IRON: 101 ug/dL (ref 45–182)
Saturation Ratios: 65 % — ABNORMAL HIGH (ref 17.9–39.5)
TIBC: 155 ug/dL — ABNORMAL LOW (ref 250–450)
UIBC: 54 ug/dL

## 2017-01-12 LAB — PROTIME-INR
INR: 2.31
Prothrombin Time: 25.8 seconds — ABNORMAL HIGH (ref 11.4–15.2)

## 2017-01-12 LAB — FERRITIN: FERRITIN: 1062 ng/mL — AB (ref 24–336)

## 2017-01-12 LAB — ALBUMIN, FLUID (OTHER): Albumin, Body Fluid Other: 0.4 g/dL

## 2017-01-12 LAB — PROTEIN, PLEURAL OR PERITONEAL FLUID

## 2017-01-12 LAB — GRAM STAIN

## 2017-01-12 LAB — VITAMIN B12: Vitamin B-12: 373 pg/mL (ref 180–914)

## 2017-01-12 LAB — T3: T3 TOTAL: 78 ng/dL (ref 71–180)

## 2017-01-12 MED ORDER — SODIUM CHLORIDE 0.9 % IV SOLN: INTRAVENOUS | Status: DC

## 2017-01-12 MED ORDER — WARFARIN SODIUM 2 MG PO TABS: 2.0000 mg | ORAL_TABLET | Freq: Once | ORAL | Status: DC

## 2017-01-12 MED ORDER — IOPAMIDOL (ISOVUE-300) INJECTION 61%
INTRAVENOUS | Status: AC
  Administered 2017-01-12: 100 mL
  Filled 2017-01-12: qty 100

## 2017-01-12 MED ORDER — FOLIC ACID 1 MG PO TABS
1.0000 mg | ORAL_TABLET | Freq: Every day | ORAL | Status: DC
  Administered 2017-01-12 – 2017-01-18 (×7): 1 mg via ORAL
  Filled 2017-01-12 (×7): qty 1

## 2017-01-12 MED ORDER — SPIRONOLACTONE 25 MG PO TABS
12.5000 mg | ORAL_TABLET | Freq: Every day | ORAL | Status: DC
  Administered 2017-01-12 – 2017-01-14 (×2): 12.5 mg via ORAL
  Filled 2017-01-12 (×2): qty 1

## 2017-01-12 MED ORDER — SODIUM CHLORIDE 0.9 % IV SOLN
INTRAVENOUS | Status: DC
  Administered 2017-01-13: 500 mL via INTRAVENOUS
  Administered 2017-01-13: 06:00:00 via INTRAVENOUS

## 2017-01-12 MED ORDER — ASPIRIN 81 MG PO CHEW
81.0000 mg | CHEWABLE_TABLET | ORAL | Status: AC
Start: 2017-01-13 — End: 2017-01-13
  Administered 2017-01-13: 81 mg via ORAL
  Filled 2017-01-12: qty 1

## 2017-01-12 NOTE — Progress Notes (Signed)
Subjective: Patient was feeling better when seen this morning. Stating that he felt much ease after paracentesis. Still having shortness of breath with ambulation. We told him that we will consult cardiology and do another CT scan of has abdomen in order to reach to a conclusion about his current illness. He was agreeable to that.  Objective:  Vital signs in last 24 hours: Vitals:   01/11/17 1242 01/11/17 2032 01/12/17 0558 01/12/17 0643  BP: 99/64 92/61  92/60  Pulse: 99 (!) 101 96   Resp: 17 18 18    Temp:  98.7 F (37.1 C) 98.6 F (37 C)   TempSrc:  Oral Oral   SpO2: 97% 96% 97%   Weight:      Height:       Gen. Well-built developed, little emaciated man, in no acute distress. Chest. Clear bilaterally. CVS. Regular rate and rhythm, hard to assess heart sounds. No JVD. Abdomen. Soft, distended, nontender, bowel sounds positive. Extremities. Bilateral 2+ pitting edema up to mid calves, slightly more prominent on left leg.  CT abdomen and pelvis.  FINDINGS: Lower chest: No acute abnormality.  Hepatobiliary: Hepatic steatosis. There is a diffuse changes of cirrhosis involving the liver. No enhancing liver abnormalities identified to suggest hepatoma. The portal vein remains patent. Unremarkable appearance of the gallbladder.  Pancreas: Normal appearance of the pancreas.  Spleen: Normal in size without focal abnormality.  Adrenals/Urinary Tract: Bilateral kidney lesions are identified. The complex midpole lesion arising from the lateral aspect of the right kidney is identified measuring 2.3 cm and 14 HU precontrast, image 78 of series 2. There is no significant enhancement within this lesion following the IV administration of contrast material with postcontrast Hounsfield units equal to 18.39. Hyperdense precontrast lesion arising from the mid left kidney measures 1.3 cm and 70 HU precontrast, image 64 of series 2. Postcontrast Hounsfield units equal 65.88. No internal  septation or enhancing nodularity identified. No hydronephrosis identified. The urinary bladder appears within normal limits.  Stomach/Bowel: The stomach is normal. The small bowel loops have a normal course and caliber. No bowel obstruction. Unremarkable appearance of the colon.  Vascular/Lymphatic: Aortic atherosclerosis. No aneurysm. No upper abdominal or pelvic adenopathy. No inguinal adenopathy noted.  Reproductive: Prostate is unremarkable.  Other: Moderate to large volume ascites identified within the abdomen and pelvis.  Musculoskeletal: No acute or significant osseous findings.  IMPRESSION: 1. 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 2. Morphologic features a liver compatible with cirrhosis. 3. Ascites. 4. Aortic atherosclerosis.   Assessment/Plan:  Jeremy BaneJames Shamlinis a 70 y.o.male.With past medical history significant for paroxysmal atrial fibrillation and [name], is on chronic anticoagulation came to ED with complaint of worsening shortness of breath lightheadedness and dizziness with ambulation,along with bilateral leg swelling and abdominal swellingfor past3 weeks.  Pulmonary hypertension with history of chronic PE and ASD. Echo shows pulmonary peak Pressure of 57. Chronic PE and ASD both can be contributory. Can lead to right heart failure and hepatic congestion. -Cardiology was consulted and they will do right and left sided cardiac cath. Most likely tomorrow if his INR becomes less than 2, cardiology also planning to do a TEE and possible repair if needed. -Hold warfarin. -Start heparin GTT once INR is below 2.  Ascites. Lab results on Ascites fluid are still pending.   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 Latex likely to be  a cause of his ascites.  Liver cirrhosis. CT abdomen is showing diffuse changes of  cirrhosis. This will be a new diagnosis for patient. -Low-dose spironolactone at 12.5 mg daily because of his softer blood pressure.  Subclinical hypothyroidism. No need for any intervention at this time.  H/O PAF. Currently sinus rhythm. -Continue anticoagulation.  Dispo: Anticipated discharge in approximately 2-3 day(s).   [name], MD 01/12/2017, 11:59 AM Pager: [phone]

## 2017-01-12 NOTE — Consult Note (Signed)
Advanced Heart Failure Team Consult Note  Referring Physician: Dr. Josem Kaufmann Primary Physician: Dr. Armen Pickup  Primary Cardiologist:  None  Reason for Consultation: Pulmonary HTN   HPI:    Jeremy Herman is a 71 year old male with a past medical history of PAF, PE and DVT (on Coumadin). He presented to the ED on 01/10/17 with a 3 week history of SOB, dizziness and bilateral leg swelling.   Echo done on 01/10/17 showed EF of 60-65%, no diastolic dysfunction. PA pressure is . Echo from 2014 shows evidence of a large atrial septal defect, but this has never been repaired and patient has not followed up.   CTA this admission noted chronic pulmonary emboli in bilateral lower lobes, also noted in previous CTA from 2015.   With history of PAF, he is on diltiazem 180 mg daily. He was a heavy drinker, but quit 16 years ago. He starting feeling bad about a month ago, he notes increasing weakness and increased activity intolerance. Also noted increased abdominal girth.    Review of Systems: [y] = yes, [ ]  = no   General: Weight gain [ y]; Weight loss [ ] ; Anorexia [ ] ; Fatigue [ ] ; Fever [ ] ; Chills [ ] ; Weakness [ ]   Cardiac: Chest pain/pressure [n ]; Resting SOB [n ]; Exertional SOB Cove.Etienne ]; Orthopnea [ ] ; Pedal Edema [ y]; Palpitations [n]; Syncope [ n]; Presyncope [ ] ; Paroxysmal nocturnal dyspnea[ ]   Pulmonary: Cough [ y]; Wheezing[ ] ; Hemoptysis[ ] ; Sputum [ ] ; Snoring [ ]   GI: Vomiting[ ] ; Dysphagia[ ] ; Melena[n ]; Hematochezia [ ] ; Heartburn[ ] ; Abdominal pain [ ] ; Constipation [ ] ; Diarrhea [ ] ; BRBPR [ ]   GU: Hematuria[ ] ; Dysuria [ ] ; Nocturia[ ]   Vascular: Pain in legs with walking [ ] ; Pain in feet with lying flat [ ] ; Non-healing sores [ ] ; Stroke [ ] ; TIA [ ] ; Slurred speech [ ] ;  Neuro: Headaches[ n]; Vertigo[ ] ; Seizures[ ] ; Paresthesias[ ] ;Blurred vision [ ] ; Diplopia [ ] ; Vision changes [n ]  Ortho/Skin: Arthritis [ ] ; Joint pain [ ] n; Muscle pain [ ] ; Joint swelling [ ] ; Back Pain [  ]; Rash [ ]   Psych: Depression[ ] ; Anxiety[ ]   Heme: Bleeding problems [ ] ; Clotting disorders Cove.Etienne ]; Anemia [ ]   Endocrine: Diabetes [ ] ; Thyroid dysfunction[ ]   Home Medications Prior to Admission medications   Medication Sig Start Date End Date Taking? Authorizing Provider  diltiazem (CARDIZEM CD) 180 MG 24 hr capsule Take 1 capsule (180 mg total) by mouth daily. 12/09/16  Yes Josalyn Funches, MD  traMADol (ULTRAM) 50 MG tablet Take 1 tablet (50 mg total) by mouth every 12 (twelve) hours as needed. 12/09/16  Yes Dessa Phi, MD  warfarin (COUMADIN) 2 MG tablet Take 1 tablet by mouth every day but 2 tablets on Tuesdays and Saturdays. Patient taking differently: Take 1-2 mg by mouth See admin instructions. 1mg  On Tuesday, Thursday, Saturday - pt takes 2mg  on Sunday, Monday, Wednesday, Friday 12/09/16  Yes Dessa Phi, MD  folic acid (FOLVITE) 1 MG tablet Take 1 tablet (1 mg total) by mouth daily. Patient not taking: Reported on 01/10/2017 12/13/16   Dessa Phi, MD  Multiple Vitamins-Minerals (MULTIVITAMIN) tablet Take 1 tablet by mouth daily. Patient not taking: Reported on 01/10/2017 12/13/16   Dessa Phi, MD  thiamine (VITAMIN B-1) 100 MG tablet Take 1 tablet (100 mg total) by mouth daily. Patient not taking: Reported on 01/10/2017 12/13/16   Dessa Phi, MD  Past Medical History: Past Medical History:  Diagnosis Date  . Arthritis   . Chronic anticoagulation   . DVT (deep venous thrombosis) (HCC)   . Medical history non-contributory   . PE (pulmonary embolism)     Past Surgical History: Past Surgical History:  Procedure Laterality Date  . ANKLE SURGERY  1979   right ankle  . FEMUR IM NAIL Left 04/21/2016   Procedure: INTRAMEDULLARY (IM) NAIL LEFT  FEMORAL;  Surgeon: Samson Frederic, MD;  Location: [facility];  Service: Orthopedics;  Laterality: Left;    Family History: Family History  Problem Relation Age of Onset  . Stomach cancer Father   . Cancer Mother   .  Aneurysm Mother   . Deep vein thrombosis Brother 80    Social History: Social History   Social History  . Marital status: Married    Spouse name: N/A  . Number of children: N/A  . Years of education: N/A   Social History Main Topics  . Smoking status: Current Every Day Smoker    Packs/day: 1.00    Years: 43.00    Types: Cigarettes  . Smokeless tobacco: Never Used     Comment: Quit 3 days ago  . Alcohol use No     Comment: Former extensive use; Quit ~16 years ago  . Drug use: No  . Sexual activity: Not Currently   Other Topics Concern  . None   Social History Narrative  . None    Allergies:  Allergies  Allergen Reactions  . Fish Allergy Anaphylaxis and Other (See Comments)    Diarrhea     Objective:    Vital Signs:   Temp:  [98.6 F (37 C)-98.7 F (37.1 C)] 98.6 F (37 C) (02/15 0558) Pulse Rate:  [96-105] 96 (02/15 0558) Resp:  [17-18] 18 (02/15 0558) BP: (90-99)/(57-64) 92/60 (02/15 0643) SpO2:  [96 %-97 %] 97 % (02/15 0558) Last BM Date: 01/11/17  Weight change: Filed Weights   01/10/17 0937  Weight: 154 lb (69.9 kg)    Intake/Output:   Intake/Output Summary (Last 24 hours) at 01/12/17 1115 Last data filed at 01/11/17 1241  Gross per 24 hour  Intake              120 ml  Output                0 ml  Net              120 ml     Physical Exam: General:  Well appearing. No resp difficulty HEENT: normal Neck: supple. No JVD. Carotids 2+ bilat; no bruits. No lymphadenopathy or thyromegaly appreciated. Cor: PMI nondisplaced. Regular rate & rhythm. No rubs, gallops or murmurs.   Lungs: clear Abdomen: soft, nontender.  Mild distention. No hepatosplenomegaly. No bruits or masses. Good bowel sounds. Extremities: no cyanosis, clubbing, rash.  1+ edema 1/2 to knees bilaterally.  Neuro: alert & orientedx3, cranial nerves grossly intact. moves all 4 extremities w/o difficulty. Affect pleasant  Telemetry: NSR  Labs: Basic Metabolic Panel:  Recent  Labs Lab 01/10/17 1028 01/11/17 0230 01/12/17 0238  NA 138 136 134*  K 3.8 3.8 3.5  CL 103 100* 100*  CO2 20* 23 26  GLUCOSE 119* 109* 132*  BUN 13 12 11   CREATININE 0.76 0.71 0.56*  CALCIUM 8.2* 8.0* 7.9*    Liver Function Tests:  Recent Labs Lab 01/10/17 1028 01/11/17 0230 01/12/17 0238  AST 114* 94* 89*  ALT 62 57 53  ALKPHOS  109 99 89  BILITOT 2.8* 2.1* 1.7*  PROT 6.5 5.8* 5.3*  ALBUMIN 2.6* 2.5* 2.3*    Recent Labs Lab 01/10/17 1028  LIPASE 22   CBC:  Recent Labs Lab 01/10/17 1028 01/11/17 0230 01/12/17 0238  WBC 15.8* 13.5* 9.5  NEUTROABS 13.6*  --   --   HGB 11.2* 9.9* 9.9*  HCT 32.1* 28.6* 28.2*  MCV 128.4* 125.4* 123.1*  PLT 201 188 142*     BNP: BNP (last 3 results)  Recent Labs  01/10/17 1029  BNP 93.8     Coagulation Studies:  Recent Labs  01/10/17 1028 01/11/17 0230 01/12/17 0238  LABPROT 33.7* 30.4* 25.8*  INR 3.22 2.84 2.31    Other results: EKG: normal EKG, normal sinus rhythm, unchanged from previous tracings.    Medications:     Current Medications: . diltiazem  180 mg Oral Daily  . sodium chloride flush  3 mL Intravenous Q12H  . warfarin  2 mg Oral ONCE-1800  . Warfarin - Pharmacist Dosing Inpatient   Does not apply q1800       Assessment/Plan   See attending note.   Length of Stay: 1  Jeremy IshikawaErin E Denetra Formoso, NP  01/12/2017, 11:15 AM  Advanced Heart Failure Team Pager [phone] (M-F; 7a - 4p)  Please contact [facility] for night-coverage after hours (4p -7a ) and weekends on [email or web address]  Patient seen with NP, agree with the above note .  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  Found to have ascites and evidence for RV dysfunction by echo as well as at least moderate pulmonary hypertension.  1. RV failure/pulmonary hypertension: Echo 2/18  with EF 60-65%, moderately dilated RV with normal systolic function, PASP 57, mild TR, no mention made of ASD.  Patient's presentation certainly could be primarily due to RV failure with hepatic congestion, ascites, and peripheral edema.  I am concerned for significant pulmonary hypertension that may be causing the RV failure.  In this situation, pulmonary hypertension could come from his secundum ASD and also from chronic thromboembolic disease, or it could be a combination of the two. On exam, he has peripheral edema and mild abdominal distention but no JVD.  - BP soft, would add low dose spironolactone given ascites, start with 12.5 mg daily.  - He will need right and left heart cath (has significant coronary calcification on CT, so would check coronaries also, especially if he may have possible surgery in the future).  Hold warfarin, cover with heparin gtt when INR < 2.  Depending on findings, may need ASD closure or treatment of chronic PE (ultimate treatment would be pulmonary thromboendarterectomy).  Will do cath tomorrow if INR < 2.  2. 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  Never had outpatient cardiology followup. He will need re-evaluation of ASD by TEE and cath.  Will arrange for TEE tomorrow, possible cath if INR < 2.  3. Ascites: Possible hepatic congestion from RV failure. Imaging showed fatty liver, not frank cirrhosis.  HCV negative, no ETOH x 16 years.  4. 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  Continue anticoagulation.  5. Atrial fibrillation: Paroxysmal.  He is currently in NSR.   Jeremy Herman 01/12/2017 12:07 PM

## 2017-01-12 NOTE — Progress Notes (Signed)
Subjective: Patient was feeling much better this morning after having the paracentesis. Patient still reports SOB with ambulation. We told him our plan that for his work up and patient was amenable to it.    Objective:  Vital signs in last 24 hours: Vitals:   01/11/17 2032 01/12/17 0558 01/12/17 0643 01/12/17 1320  BP: 92/61  92/60 118/90  Pulse: (!) 101 96  91  Resp: 18 18    Temp: 98.7 F (37.1 C) 98.6 F (37 C)  97.7 F (36.5 C)  TempSrc: Oral Oral  Oral  SpO2: 96% 97%  97%  Weight:      Height:       Physical Exam  Constitutional: He appears well-developed and well-nourished.  Cardiovascular: Normal rate, regular rhythm and normal heart sounds.   Pulmonary/Chest: Effort normal and breath sounds normal.  Abdominal: Soft. Bowel sounds are normal.  Musculoskeletal:  +2 pitting edema, L  > R leg edema    CT abdomen and pelvis.  FINDINGS: Lower chest: No acute abnormality.  Hepatobiliary: Hepatic steatosis. There is a diffuse changes of cirrhosis involving the liver. No enhancing liver abnormalities identified to suggest hepatoma. The portal vein remains patent. Unremarkable appearance of the gallbladder.  Pancreas: Normal appearance of the pancreas.  Spleen: Normal in size without focal abnormality.  Adrenals/Urinary Tract: Bilateral kidney lesions are identified. The complex midpole lesion arising from the lateral aspect of the right kidney is identified measuring 2.3 cm and 14 HU precontrast, image 78 of series 2. There is no significant enhancement within this lesion following the IV administration of contrast material with postcontrast Hounsfield units equal to 18.39. Hyperdense precontrast lesion arising from the mid left kidney measures 1.3 cm and 70 HU precontrast, image 64 of series 2. Postcontrast Hounsfield units equal 65.88. No internal septation or enhancing nodularity identified. No hydronephrosis identified. The urinary bladder appears within  normal limits.  Stomach/Bowel: The stomach is normal. The small bowel loops have a normal course and caliber. No bowel obstruction. Unremarkable appearance of the colon.  Vascular/Lymphatic: Aortic atherosclerosis. No aneurysm. No upper abdominal or pelvic adenopathy. No inguinal adenopathy noted.  Reproductive: Prostate is unremarkable.  Other: Moderate to large volume ascites identified within the abdomen and pelvis.  Musculoskeletal: No acute or significant osseous findings.  IMPRESSION: 1. 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 2. Morphologic features a liver compatible with cirrhosis. 3. Ascites. 4. Aortic atherosclerosis.  Assessment/Plan:  Active Problems:   Chronic pulmonary embolism (HCC)   Ascites   Pulmonary embolus (HCC)   Other ascites   Shortness of breath   SOB (shortness of breath)   Pulmonary hypertension, primary (HCC)   RVF (right ventricular failure) (HCC)   71 y.o. male with h/o paroxysmal AFib, [name],who presented with worsening shortness of breath lightheadedness and dizziness with ambulation,along with bilateral leg swelling and abdominal swellingfor past3 weeks.  Pulmonary HTN w/ hx of chronic PE and ASD Liver Cirrhosis Patient has three main pathology on the differential for combined SOB and swelling liver disease, pulmonary HTN, or malignancy. Paracentesis was taken yesterday but results are still pending. Because we were not able to get the albumin fluid, we decided to consult Cardiology for RHC and CT scan of abdomen to characterize the kidney cyst and further assess the liver. Results of CT for kidney are mentioned below.  Liver was found to have multiple features with cirrhosis. Patient made us aware that he was told to have  an ASD. Looking through the chart, patient was told to have a large ASD but never followed up with cardiology for it. We have consulted Cards and RHC & LHC  will be performed tomorrow as long as INR is below 2.0. -- RHC & LHC tomorrow -- hold warfarin -- start heparin GTT once INR is below 2  Complex Cyst of Kidney CT abdomen w/ & w/o contrast reveal that Kidney cyst are Bosniak category 1 & 2  Subclinical Hypothyroidism T4 is normal. No need to treat at this time.   Fluids: None Diet: Regular Diet DVT Prophylaxis: SQH Code Status: FULL  Dispo: Anticipated discharge pending clinical course   Caryn Bee, Medical Student 01/12/2017, 3:40 PM Pager: [phone]

## 2017-01-12 NOTE — Progress Notes (Signed)
ANTICOAGULATION CONSULT NOTE - FOLLOW UP  Pharmacy Consult:  Coumadin> heparin Indication: History of DVT/PE  Allergies  Allergen Reactions  . Fish Allergy Anaphylaxis and Other (See Comments)    Diarrhea     Patient Measurements: Height: 6' (182.9 cm) Weight: 154 lb (69.9 kg) IBW/kg (Calculated) : 77.6  Vital Signs: Temp: 98.6 F (37 C) (02/15 0558) Temp Source: Oral (02/15 0558) BP: 92/60 (02/15 0643) Pulse Rate: 96 (02/15 0558)  Labs:  Recent Labs  01/10/17 1028 01/11/17 0230 01/12/17 0238  HGB 11.2* 9.9* 9.9*  HCT 32.1* 28.6* 28.2*  PLT 201 188 142*  APTT  --  48*  --   LABPROT 33.7* 30.4* 25.8*  INR 3.22 2.84 2.31  CREATININE 0.76 0.71 0.56*    Estimated Creatinine Clearance: 86.2 mL/min (by C-G formula based on SCr of 0.56 mg/dL (L)).    Assessment: 70 YOM on Coumadin PTA for history of PE/DVT, presented with SOB and swelling of lower extremities. CTA showed partial resolution of chronic PE, negative for new PE.  INR remains therapeutic and is trending down; no bleeding reported.  Noted documentation of ascites and chronic liver disease.  Home Coumadin regimen: 2mg  daily except 1mg  on TTS   Goal of Therapy:  INR 2-3    Plan:  - Coumadin 2mg  PO today - Daily PT / INR   Addendum  Plan to hold warfarin for R/L heart cath Heparin drip when INR < 2 INR 2.3 will follow AM labs  Leota SauersLisa Ahnesti Townsend Pharm.D. CPP, BCPS Clinical Pharmacist [phone] 01/12/2017 11:49 AM

## 2017-01-12 NOTE — Evaluation (Signed)
Physical Therapy Evaluation Patient Details Name: Jeremy Herman MRN: [id number] DOB: [birth year] Today's Date: 01/12/2017   History of Present Illness  71 y.o. male.With past medical history significant for  paroxysmal atrial fibrillation and [name], is on chronic anticoagulation came to ED with complaint of worsening shortness of breath lightheadedness and dizziness with ambulation, along with bilateral leg swelling and abdominal swelling for past 3 weeks.    Clinical Impression  Pt admitted with above diagnosis. Pt currently with functional limitations due to the deficits listed below (see PT Problem List).  Pt will benefit from skilled PT to increase their independence and safety with mobility to allow discharge to the venue listed below.  Pt limited by dizziness and BP.  Feel that once this is not an issue, pt should progress well.  At this time recommend HHPT, but will continue to assess.    ORTHOSTATICS: Supine 95/61 HR 89 Sitting 82/46 HR 93 with dizziness Standing 81/48 with dizziness and unable to attempt standing for 3 minutes.    Follow Up Recommendations Home health PT    Equipment Recommendations  None recommended by PT    Recommendations for Other Services       Precautions / Restrictions Precautions Precautions: Fall Restrictions Weight Bearing Restrictions: No      Mobility  Bed Mobility Overal bed mobility: Needs Assistance Bed Mobility: Rolling;Sidelying to Sit Rolling: Supervision Sidelying to sit: Supervision       General bed mobility comments: cues for technique with increased time. Pt dizzy upon sitting.  Returned supine to take orthostatics  Transfers Overall transfer level: Needs assistance Equipment used: Rolling walker (2 wheeled) Transfers: Sit to/from [employer]Stand;Stand Pivot Transfers Sit to Stand: Min guard Stand pivot transfers: Min guard       General transfer comment: use of RW and  pt with dizziness in standing, but able to  get orthostatic BP. Slow SPT to recliner with RW  Ambulation/Gait             General Gait Details: deferred due to dizziness and BP  Stairs            Wheelchair Mobility    Modified Rankin (Stroke Patients Only)       Balance Overall balance assessment: Needs assistance Sitting-balance support: Feet supported;No upper extremity supported Sitting balance-Leahy Scale: Good     Standing balance support: Bilateral upper extremity supported Standing balance-Leahy Scale: Poor Standing balance comment: RW for support                             Pertinent Vitals/Pain Pain Assessment: No/denies pain    Home Living Family/patient expects to be discharged to:: Private residence Living Arrangements: Alone Available Help at Discharge: Friend(s);Available PRN/intermittently Type of Home: Apartment Home Access: Level entry     Home Layout: One level Home Equipment: Walker - 2 wheels;Bedside commode;Grab bars - tub/shower      Prior Function Level of Independence: Independent with assistive device(s)         Comments: RW for all mobility, drives, independent with ADL. was ambulating 20'-40' at times without RW until about 3 weeks ago and since has been using RW     Hand Dominance   Dominant Hand: Right    Extremity/Trunk Assessment   Upper Extremity Assessment Upper Extremity Assessment: Defer to OT evaluation    Lower Extremity Assessment Lower Extremity Assessment: Generalized weakness    Cervical / Trunk Assessment Cervical /  Trunk Assessment: Normal  Communication   Communication: No difficulties  Cognition Arousal/Alertness: Awake/alert Behavior During Therapy: WFL for tasks assessed/performed Overall Cognitive Status: Within Functional Limits for tasks assessed                      General Comments General comments (skin integrity, edema, etc.): swelling noted in B LE    Exercises     Assessment/Plan    PT  Assessment Patient needs continued PT services  PT Problem List Decreased strength;Decreased activity tolerance;Decreased balance;Decreased mobility;Cardiopulmonary status limiting activity          PT Treatment Interventions DME instruction;Gait training;Functional mobility training;Therapeutic activities;Therapeutic exercise;Balance training    PT Goals (Current goals can be found in the Care Plan section)  Acute Rehab PT Goals Patient Stated Goal: get better and return home PT Goal Formulation: With patient Time For Goal Achievement: 01/26/17 Potential to Achieve Goals: Good    Frequency Min 3X/week   Barriers to discharge        Co-evaluation               End of Session Equipment Utilized During Treatment: Gait belt Activity Tolerance: Treatment limited secondary to medical complications (Comment) (dizziness and BP) Patient left: in chair;with call bell/phone within reach Nurse Communication: Mobility status;Other (comment) (BP)         Time: 1324-4010 PT Time Calculation (min) (ACUTE ONLY): 28 min   Charges:   PT Evaluation $PT Eval Moderate Complexity: 1 Procedure PT Treatments $Therapeutic Activity: 8-22 mins   PT G Codes:        Jeremy Herman 01/12/2017, 9:38 AM

## 2017-01-12 NOTE — Discharge Instructions (Addendum)

## 2017-01-12 NOTE — Progress Notes (Signed)
ANTICOAGULATION CONSULT NOTE - FOLLOW UP  Pharmacy Consult:  Coumadin Indication: History of DVT/PE  Allergies  Allergen Reactions  . Fish Allergy Anaphylaxis and Other (See Comments)    Diarrhea     Patient Measurements: Height: 6' (182.9 cm) Weight: 154 lb (69.9 kg) IBW/kg (Calculated) : 77.6  Vital Signs: Temp: 98.6 F (37 C) (02/15 0558) Temp Source: Oral (02/15 0558) BP: 92/60 (02/15 0643) Pulse Rate: 96 (02/15 0558)  Labs:  Recent Labs  01/10/17 1028 01/11/17 0230 01/12/17 0238  HGB 11.2* 9.9* 9.9*  HCT 32.1* 28.6* 28.2*  PLT 201 188 142*  APTT  --  48*  --   LABPROT 33.7* 30.4* 25.8*  INR 3.22 2.84 2.31  CREATININE 0.76 0.71 0.56*    Estimated Creatinine Clearance: 86.2 mL/min (by C-G formula based on SCr of 0.56 mg/dL (L)).    Assessment: 70 YOM on Coumadin PTA for history of PE/DVT, presented with SOB and swelling of lower extremities. CTA showed partial resolution of chronic PE, negative for new PE.  INR remains therapeutic and is trending down; no bleeding reported.  Noted documentation of ascites and chronic liver disease.  Home Coumadin regimen: 2mg  daily except 1mg  on TTS   Goal of Therapy:  INR 2-3    Plan:  - Coumadin 2mg  PO today - Daily PT / INR   Jeremy Herman, PharmD, BCPS Pager:  [phone] 01/12/2017, 8:24 AM

## 2017-01-12 NOTE — Progress Notes (Signed)
Internal Medicine Attending  Date: 01/12/2017  Patient name: Jeremy Herman Medical record number: [id number] Date of birth: November 02, 1946 Age: 71 y.o. Gender: male  I saw and evaluated the patient. I reviewed the resident's note by Dr. Nelson ChimesAmin and I agree with the resident's findings and plans as documented in her progress note.  When seen on rounds this morning Jeremy Herman noted improvement in his dyspnea after the 2.7 L paracentesis. That being said, he still is dyspneic with minimal exertion. His echocardiogram demonstrated a peak pulmonary artery pressure 57 mmHg. Further history from the patient reveals that he had a prior atrial septal defect that was felt to be significant and required follow-up. He never had this follow-up. A CT scan of the abdomen to assess the exophytic kidney lesion is suggestive of a benign rather than malignant lesion. It did characterize fatty infiltration of the liver with concomitant cirrhosis which was not appreciated on the abdominal ultrasound. Thus, the ascites could still be related to portal hypertension given the cirrhosis. That being said, at least he denied platypnea and he did not have orthodeoxia on exam today.   It appears the housestaff did an anemia workup and his iron studies are suggestive of hemochromatosis or iron overload. 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 Unfortunately, being anemic at baseline, he would not be a good candidate for phlebotomy and chelation therapy is much too toxic given his chronic medical problems. He did have a low normal vitamin B12 level and this may benefit from a methylmalonic acid level assessment. His folate was also low even though it was not a red blood cell folate. 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 This is something that could be assessed for in the near  future once his other medical problems are diagnosed and appropriately treated if possible.   Given the pulmonary hypertension seen on the echocardiogram, and as expected given the chronic pulmonary venous thromboembolic disease, we asked cardiology to assess if he would be a candidate for a right heart catheterization to further assess the pulmonary hypertension, as well as the now discovered ASD. They were kind enough to provide further insight into his potential pulmonary hypertension and are planning on both a right heart cath and left heart cath to further assess, especially given the extensive coronary calcification seen on the CT angiogram upon admission. If he does require surgical intervention to close his ASD they want to understand his anatomy in case he requires a bypass at that time as well. This will be done once his INR drops below 2.0. They are also planning a TEE tomorrow, again to better assess his known large ASD.   Thus, the pulmonary hypertension may be from the chronic pulmonary venous thromboembolic disease, a left to right shunt with increased pulmonary artery pressures, or pulmonary artery hypertension related to his chronic liver disease. I am hopeful that with further assessment as planned we will have a somewhat better idea of what may be the cause of his pulmonary hypertension and intervene if possible.   Although he appears chronically ill he is stable, but the decompensation he experienced 3 weeks ago may only be the beginning of his difficulties as he may not have any further physiologic reserve in several systems. He will require several more days of inpatient care for assessment  and possible treatment of his underlying issues.

## 2017-01-13 ENCOUNTER — Encounter (HOSPITAL_COMMUNITY)
Admission: EM | Disposition: A | Discharge status: Home / Self Care | Payer: Self-pay | Source: Home / Self Care | Attending: Internal Medicine

## 2017-01-13 ENCOUNTER — Inpatient Hospital Stay (HOSPITAL_COMMUNITY): Payer: Medicare Other

## 2017-01-13 ENCOUNTER — Encounter (HOSPITAL_COMMUNITY): Payer: Self-pay | Admitting: *Deleted

## 2017-01-13 ENCOUNTER — Ambulatory Visit (HOSPITAL_COMMUNITY): Admission: RE | Admit: 2017-01-13 | Payer: Medicare Other | Source: Ambulatory Visit | Admitting: Cardiology

## 2017-01-13 DIAGNOSIS — I272 Pulmonary hypertension, unspecified: Secondary | ICD-10-CM

## 2017-01-13 DIAGNOSIS — I340 Nonrheumatic mitral (valve) insufficiency: Secondary | ICD-10-CM

## 2017-01-13 DIAGNOSIS — R778 Other specified abnormalities of plasma proteins: Secondary | ICD-10-CM

## 2017-01-13 DIAGNOSIS — I2510 Atherosclerotic heart disease of native coronary artery without angina pectoris: Secondary | ICD-10-CM

## 2017-01-13 DIAGNOSIS — Q211 Atrial septal defect: Principal | ICD-10-CM

## 2017-01-13 DIAGNOSIS — I2782 Chronic pulmonary embolism: Secondary | ICD-10-CM

## 2017-01-13 DIAGNOSIS — I951 Orthostatic hypotension: Secondary | ICD-10-CM

## 2017-01-13 HISTORY — PX: RIGHT/LEFT HEART CATH AND CORONARY ANGIOGRAPHY: CATH118266

## 2017-01-13 HISTORY — PX: CARDIAC CATHETERIZATION: SHX172

## 2017-01-13 HISTORY — PX: TEE WITHOUT CARDIOVERSION: SHX5443

## 2017-01-13 LAB — POCT I-STAT 3, VENOUS BLOOD GAS (G3P V)
ACID-BASE DEFICIT: 3 mmol/L — AB (ref 0.0–2.0)
ACID-BASE DEFICIT: 2 mmol/L (ref 0.0–2.0)
Acid-base deficit: 2 mmol/L (ref 0.0–2.0)
Acid-base deficit: 3 mmol/L — ABNORMAL HIGH (ref 0.0–2.0)
BICARBONATE: 22.1 mmol/L (ref 20.0–28.0)
BICARBONATE: 22.4 mmol/L (ref 20.0–28.0)
Bicarbonate: 22.4 mmol/L (ref 20.0–28.0)
Bicarbonate: 21.6 mmol/L (ref 20.0–28.0)
O2 SAT: 71 %
O2 SAT: 83 %
O2 Saturation: 69 %
O2 Saturation: 84 %
PH VEN: 7.396 (ref 7.250–7.430)
PH VEN: 7.408 (ref 7.250–7.430)
PO2 VEN: 47 mmHg — AB (ref 32.0–45.0)
TCO2: 24 mmol/L (ref 0–100)
TCO2: 23 mmol/L (ref 0–100)
TCO2: 23 mmol/L (ref 0–100)
TCO2: 23 mmol/L (ref 0–100)
pCO2, Ven: 39.6 mmHg — ABNORMAL LOW (ref 44.0–60.0)
pCO2, Ven: 35.1 mmHg — ABNORMAL LOW (ref 44.0–60.0)
pCO2, Ven: 35.1 mmHg — ABNORMAL LOW (ref 44.0–60.0)
pCO2, Ven: 36.5 mmHg — ABNORMAL LOW (ref 44.0–60.0)
pH, Ven: 7.361 (ref 7.250–7.430)
pH, Ven: 7.396 (ref 7.250–7.430)
pO2, Ven: 39 mmHg (ref 32.0–45.0)
pO2, Ven: 36 mmHg (ref 32.0–45.0)
pO2, Ven: 48 mmHg — ABNORMAL HIGH (ref 32.0–45.0)

## 2017-01-13 LAB — URINALYSIS, ROUTINE W REFLEX MICROSCOPIC
BILIRUBIN URINE: NEGATIVE
Glucose, UA: NEGATIVE mg/dL
HGB URINE DIPSTICK: NEGATIVE
Ketones, ur: NEGATIVE mg/dL
Leukocytes, UA: NEGATIVE
NITRITE: NEGATIVE
Protein, ur: NEGATIVE mg/dL
Specific Gravity, Urine: 1.019 (ref 1.005–1.030)
pH: 7 (ref 5.0–8.0)

## 2017-01-13 LAB — CBC
HEMATOCRIT: 27.9 % — AB (ref 39.0–52.0)
HEMOGLOBIN: 9.9 g/dL — AB (ref 13.0–17.0)
MCH: 43.2 pg — ABNORMAL HIGH (ref 26.0–34.0)
MCHC: 35.5 g/dL (ref 30.0–36.0)
MCV: 121.8 fL — ABNORMAL HIGH (ref 78.0–100.0)
Platelets: 139 10*3/uL — ABNORMAL LOW (ref 150–400)
RBC: 2.29 MIL/uL — AB (ref 4.22–5.81)
RDW: 15.2 % (ref 11.5–15.5)
WBC: 10.7 10*3/uL — AB (ref 4.0–10.5)

## 2017-01-13 LAB — PROTIME-INR
INR: 2.05
INR: 1.75
PROTHROMBIN TIME: 23.4 s — AB (ref 11.4–15.2)
Prothrombin Time: 20.7 seconds — ABNORMAL HIGH (ref 11.4–15.2)

## 2017-01-13 LAB — COMPREHENSIVE METABOLIC PANEL
ALT: 58 U/L (ref 17–63)
ANION GAP: 7 (ref 5–15)
AST: 109 U/L — ABNORMAL HIGH (ref 15–41)
Albumin: 2.3 g/dL — ABNORMAL LOW (ref 3.5–5.0)
Alkaline Phosphatase: 86 U/L (ref 38–126)
BILIRUBIN TOTAL: 1.7 mg/dL — AB (ref 0.3–1.2)
BUN: 8 mg/dL (ref 6–20)
CO2: 25 mmol/L (ref 22–32)
Calcium: 7.9 mg/dL — ABNORMAL LOW (ref 8.9–10.3)
Chloride: 100 mmol/L — ABNORMAL LOW (ref 101–111)
Creatinine, Ser: 0.56 mg/dL — ABNORMAL LOW (ref 0.61–1.24)
Glucose, Bld: 123 mg/dL — ABNORMAL HIGH (ref 65–99)
POTASSIUM: 3.3 mmol/L — AB (ref 3.5–5.1)
Sodium: 132 mmol/L — ABNORMAL LOW (ref 135–145)
TOTAL PROTEIN: 5.6 g/dL — AB (ref 6.5–8.1)

## 2017-01-13 LAB — POCT I-STAT 3, ART BLOOD GAS (G3+)
ACID-BASE DEFICIT: 8 mmol/L — AB (ref 0.0–2.0)
Bicarbonate: 16.7 mmol/L — ABNORMAL LOW (ref 20.0–28.0)
O2 SAT: 81 %
PCO2 ART: 29.9 mmHg — AB (ref 32.0–48.0)
PO2 ART: 46 mmHg — AB (ref 83.0–108.0)
TCO2: 18 mmol/L (ref 0–100)
pH, Arterial: 7.355 (ref 7.350–7.450)

## 2017-01-13 LAB — POCT ACTIVATED CLOTTING TIME: Activated Clotting Time: 186 seconds

## 2017-01-13 SURGERY — ECHOCARDIOGRAM, TRANSESOPHAGEAL
Anesthesia: Moderate Sedation

## 2017-01-13 SURGERY — RIGHT/LEFT HEART CATH AND CORONARY ANGIOGRAPHY

## 2017-01-13 MED ORDER — POTASSIUM CHLORIDE CRYS ER 20 MEQ PO TBCR
40.0000 meq | EXTENDED_RELEASE_TABLET | Freq: Once | ORAL | Status: AC
  Administered 2017-01-13: 40 meq via ORAL
  Filled 2017-01-13: qty 2

## 2017-01-13 MED ORDER — LIDOCAINE HCL (PF) 1 % IJ SOLN
INTRAMUSCULAR | Status: AC
  Filled 2017-01-13: qty 30

## 2017-01-13 MED ORDER — HEPARIN SODIUM (PORCINE) 1000 UNIT/ML IJ SOLN
INTRAMUSCULAR | Status: DC | PRN
  Administered 2017-01-13: 3500 [IU] via INTRAVENOUS

## 2017-01-13 MED ORDER — VERAPAMIL HCL 2.5 MG/ML IV SOLN
INTRAVENOUS | Status: DC | PRN
  Administered 2017-01-13: 10 mL via INTRA_ARTERIAL

## 2017-01-13 MED ORDER — FENTANYL CITRATE (PF) 100 MCG/2ML IJ SOLN
INTRAMUSCULAR | Status: DC | PRN
  Administered 2017-01-13: 25 ug via INTRAVENOUS

## 2017-01-13 MED ORDER — MIDAZOLAM HCL 2 MG/2ML IJ SOLN
INTRAMUSCULAR | Status: AC
  Filled 2017-01-13: qty 2

## 2017-01-13 MED ORDER — IOPAMIDOL (ISOVUE-370) INJECTION 76%
INTRAVENOUS | Status: DC | PRN
  Administered 2017-01-13: 65 mL via INTRA_ARTERIAL

## 2017-01-13 MED ORDER — MIDAZOLAM HCL 10 MG/2ML IJ SOLN
INTRAMUSCULAR | Status: DC | PRN
  Administered 2017-01-13: 1 mg via INTRAVENOUS
  Administered 2017-01-13: 2 mg via INTRAVENOUS

## 2017-01-13 MED ORDER — HEPARIN (PORCINE) IN NACL 2-0.9 UNIT/ML-% IJ SOLN
INTRAMUSCULAR | Status: DC | PRN
  Administered 2017-01-13: 1000 mL

## 2017-01-13 MED ORDER — HEPARIN (PORCINE) IN NACL 100-0.45 UNIT/ML-% IJ SOLN
1650.0000 [IU]/h | INTRAMUSCULAR | Status: DC
  Administered 2017-01-13: 1500 [IU]/h via INTRAVENOUS
  Administered 2017-01-14 – 2017-01-15 (×2): 1650 [IU]/h via INTRAVENOUS
  Filled 2017-01-13 (×4): qty 250

## 2017-01-13 MED ORDER — SODIUM CHLORIDE 0.9% FLUSH
3.0000 mL | Freq: Two times a day (BID) | INTRAVENOUS | Status: DC
  Administered 2017-01-15 – 2017-01-16 (×3): 3 mL via INTRAVENOUS

## 2017-01-13 MED ORDER — ACETAMINOPHEN 325 MG PO TABS: 650.0000 mg | ORAL_TABLET | ORAL | Status: DC | PRN

## 2017-01-13 MED ORDER — SODIUM CHLORIDE 0.9 % IV SOLN: 250.0000 mL | INTRAVENOUS | Status: DC | PRN

## 2017-01-13 MED ORDER — WARFARIN - PHARMACIST DOSING INPATIENT
Freq: Every day | Status: DC
  Administered 2017-01-14 – 2017-01-15 (×2)

## 2017-01-13 MED ORDER — FENTANYL CITRATE (PF) 100 MCG/2ML IJ SOLN
INTRAMUSCULAR | Status: DC | PRN
  Administered 2017-01-13 (×2): 25 ug via INTRAVENOUS

## 2017-01-13 MED ORDER — FENTANYL CITRATE (PF) 100 MCG/2ML IJ SOLN
INTRAMUSCULAR | Status: AC
  Filled 2017-01-13: qty 2

## 2017-01-13 MED ORDER — MIDAZOLAM HCL 2 MG/2ML IJ SOLN
INTRAMUSCULAR | Status: DC | PRN
  Administered 2017-01-13: 1 mg via INTRAVENOUS

## 2017-01-13 MED ORDER — ATORVASTATIN CALCIUM 20 MG PO TABS
20.0000 mg | ORAL_TABLET | Freq: Every day | ORAL | Status: DC
  Administered 2017-01-13 – 2017-01-16 (×4): 20 mg via ORAL
  Filled 2017-01-13 (×4): qty 1

## 2017-01-13 MED ORDER — LIDOCAINE HCL (PF) 1 % IJ SOLN
INTRAMUSCULAR | Status: DC | PRN
  Administered 2017-01-13 (×2): 12 mL via INTRADERMAL
  Administered 2017-01-13: 2 mL via INTRADERMAL

## 2017-01-13 MED ORDER — WARFARIN SODIUM 2 MG PO TABS
2.0000 mg | ORAL_TABLET | Freq: Once | ORAL | Status: AC
  Administered 2017-01-13: 2 mg via ORAL
  Filled 2017-01-13 (×2): qty 1

## 2017-01-13 MED ORDER — ONDANSETRON HCL 4 MG/2ML IJ SOLN: 4.0000 mg | Freq: Four times a day (QID) | INTRAMUSCULAR | Status: DC | PRN

## 2017-01-13 MED ORDER — HEPARIN (PORCINE) IN NACL 2-0.9 UNIT/ML-% IJ SOLN
INTRAMUSCULAR | Status: AC
  Filled 2017-01-13: qty 1000

## 2017-01-13 MED ORDER — BUTAMBEN-TETRACAINE-BENZOCAINE 2-2-14 % EX AERO
INHALATION_SPRAY | CUTANEOUS | Status: DC | PRN
  Administered 2017-01-13: 2 via TOPICAL

## 2017-01-13 MED ORDER — SODIUM CHLORIDE 0.9% FLUSH
3.0000 mL | INTRAVENOUS | Status: DC | PRN
Start: 2017-01-14 — End: 2017-01-18

## 2017-01-13 MED ORDER — SODIUM CHLORIDE 0.9 % WEIGHT BASED INFUSION: 1.0000 mL/kg/h | INTRAVENOUS | Status: AC

## 2017-01-13 MED ORDER — HEPARIN SODIUM (PORCINE) 1000 UNIT/ML IJ SOLN
INTRAMUSCULAR | Status: AC
  Filled 2017-01-13: qty 1

## 2017-01-13 MED ORDER — VERAPAMIL HCL 2.5 MG/ML IV SOLN
INTRAVENOUS | Status: AC
  Filled 2017-01-13: qty 2

## 2017-01-13 MED ORDER — MIDAZOLAM HCL 5 MG/ML IJ SOLN
INTRAMUSCULAR | Status: AC
  Filled 2017-01-13: qty 2

## 2017-01-13 MED ORDER — IOPAMIDOL (ISOVUE-370) INJECTION 76%
INTRAVENOUS | Status: AC
Start: 2017-01-13 — End: 2017-01-13
  Filled 2017-01-13: qty 100

## 2017-01-13 SURGICAL SUPPLY — 16 items
CATH IMPULSE 5F ANG/FL3.5 (CATHETERS) ×3 IMPLANT
CATH SWAN GANZ 7F STRAIGHT (CATHETERS) ×3 IMPLANT
COVER PRB 48X5XTLSCP FOLD TPE (BAG) ×1 IMPLANT
COVER PROBE 5X48 (BAG) ×2
DEVICE RAD COMP TR BAND LRG (VASCULAR PRODUCTS) ×3 IMPLANT
GLIDESHEATH SLEND SS 6F .021 (SHEATH) ×3 IMPLANT
GUIDEWIRE INQWIRE 1.5J.035X260 (WIRE) ×1 IMPLANT
INQWIRE 1.5J .035X260CM (WIRE) ×3
KIT HEART LEFT (KITS) ×3 IMPLANT
KIT HEART RIGHT NAMIC (KITS) ×3 IMPLANT
PACK CARDIAC CATHETERIZATION (CUSTOM PROCEDURE TRAY) ×3 IMPLANT
SHEATH PINNACLE 7F 10CM (SHEATH) ×3 IMPLANT
TRANSDUCER W/STOPCOCK (MISCELLANEOUS) ×3 IMPLANT
TUBING ART PRESS 72  MALE/FEM (TUBING) ×2
TUBING ART PRESS 72 MALE/FEM (TUBING) ×1 IMPLANT
TUBING CIL FLEX 10 FLL-RA (TUBING) IMPLANT

## 2017-01-13 NOTE — Progress Notes (Signed)
ANTICOAGULATION CONSULT NOTE - FOLLOW UP  Pharmacy Consult:  Coumadin> heparin Indication: History of DVT/PE  Allergies  Allergen Reactions  . Fish Allergy Anaphylaxis and Other (See Comments)    Diarrhea     Patient Measurements: Height: 6' (182.9 cm) Weight: 154 lb (69.9 kg) IBW/kg (Calculated) : 77.6  Vital Signs: Temp: 98.4 F (36.9 C) (02/16 0413) Temp Source: Oral (02/16 0413) BP: 90/60 (02/16 0805) Pulse Rate: 118 (02/16 0413)  Labs:  Recent Labs  01/11/17 0230 01/12/17 0238 01/13/17 0231  HGB 9.9* 9.9* 9.9*  HCT 28.6* 28.2* 27.9*  PLT 188 142* 139*  APTT 48*  --   --   LABPROT 30.4* 25.8* 23.4*  INR 2.84 2.31 2.05  CREATININE 0.71 0.56* 0.56*    Estimated Creatinine Clearance: 86.2 mL/min (by C-G formula based on SCr of 0.56 mg/dL (L)).    Assessment: 70 YOM on Coumadin PTA for history of PE/DVT, presented with SOB and swelling of lower extremities. CTA showed partial resolution of chronic PE, negative for new PE.  INR remains therapeutic and is trending down 2.03 this am re-check if < 2 plan R/L HC today; no bleeding reported.  Noted documentation of ascites and chronic liver disease.  Home Coumadin regimen: 2mg  daily except 1mg  on TTS   Goal of Therapy:  INR 2-3    Plan:  Plan to hold warfarin for R/L heart cath Heparin drip when INR < 2 Follow up recheck INR  Leota SauersLisa Joshalyn Ancheta Pharm.D. CPP, BCPS Clinical Pharmacist [phone] 01/13/2017 10:49 AM

## 2017-01-13 NOTE — Progress Notes (Signed)
OT Cancellation Note  Patient Details Name: Magda PaganiniJames Clear MRN: [id number] DOB: [birth year]   Cancelled Treatment:    Reason Eval/Treat Not Completed: Patient at procedure or test/ unavailable. Pt currently off the floor. Will follow up as time allows.  Gaye AlkenBailey A Maleiyah Releford M.S., OTR/L Pager: [phone]  01/13/2017, 1:56 PM

## 2017-01-13 NOTE — Progress Notes (Signed)
ANTICOAGULATION CONSULT NOTE - FOLLOW UP  Pharmacy Consult:  Coumadin> heparin Indication: History of DVT/PE  Allergies  Allergen Reactions  . Fish Allergy Anaphylaxis and Other (See Comments)    Diarrhea     Patient Measurements: Height: 6' (182.9 cm) Weight: 154 lb (69.9 kg) IBW/kg (Calculated) : 77.6  Vital Signs: Temp: 97.8 F (36.6 C) (02/16 1123) Temp Source: Oral (02/16 1123) BP: 93/66 (02/16 1505) Pulse Rate: 87 (02/16 1505)  Labs:  Recent Labs  01/11/17 0230 01/12/17 0238 01/13/17 0231 01/13/17 1006  HGB 9.9* 9.9* 9.9*  --   HCT 28.6* 28.2* 27.9*  --   PLT 188 142* 139*  --   APTT 48*  --   --   --   LABPROT 30.4* 25.8* 23.4* 20.7*  INR 2.84 2.31 2.05 1.75  CREATININE 0.71 0.56* 0.56*  --     Estimated Creatinine Clearance: 86.2 mL/min (by C-G formula based on SCr of 0.56 mg/dL (L)).    Assessment: 70 YOM on Coumadin PTA for history of PE/DVT, presented with SOB and swelling of lower extremities. CTA showed partial resolution of chronic PE, negative for new PE.   INR decreased to 1.7 so patient was taken for cath. Possible ASD which would require intervention to fix although the cardiologist that performs this is not back until next week. Orders to resume anticoagulation tonight.  Home Coumadin regimen: 2mg  daily except 1mg  on TTS  Goal of Therapy:  INR 2-3   Plan:  Start IV heparin at 1500 units/hr tonight ~2300 Warfarin 2mg  tonight Daily INR/heparin level  Sheppard CoilFrank Madason Rauls PharmD., BCPS Clinical Pharmacist Pager [phone] 01/13/2017 3:42 PM

## 2017-01-13 NOTE — H&P (View-Only) (Signed)
Patient ID: Jeremy Herman, male   DOB: 01/23/1946, 70 y.o.   MRN: [id number]   SUBJECTIVE: No complaints this morning, walked to bathroom without dyspnea.    Scheduled Meds: . diltiazem  180 mg Oral Daily  . folic acid  1 mg Oral Daily  . sodium chloride flush  3 mL Intravenous Q12H  . spironolactone  12.5 mg Oral Daily   Continuous Infusions: . sodium chloride    . sodium chloride 10 mL/hr at 01/13/17 0609   PRN Meds:.metoprolol, senna-docusate    Vitals:   01/12/17 0643 01/12/17 1320 01/12/17 2047 01/13/17 0413  BP: 92/60 118/90 (!) 90/59 (!) 85/66  Pulse:  91 98 (!) 118  Resp:   18 18  Temp:  97.7 F (36.5 C) 98.3 F (36.8 C) 98.4 F (36.9 C)  TempSrc:  Oral Oral Oral  SpO2:  97% 98% 94%  Weight:      Height:        Intake/Output Summary (Last 24 hours) at 01/13/17 0805 Last data filed at 01/13/17 0001  Gross per 24 hour  Intake              360 ml  Output              500 ml  Net             -140 ml    LABS: Basic Metabolic Panel:  Recent Labs  01/12/17 0238 01/13/17 0231  NA 134* 132*  K 3.5 3.3*  CL 100* 100*  CO2 26 25  GLUCOSE 132* 123*  BUN 11 8  CREATININE 0.56* 0.56*  CALCIUM 7.9* 7.9*   Liver Function Tests:  Recent Labs  01/12/17 0238 01/13/17 0231  AST 89* 109*  ALT 53 58  ALKPHOS 89 86  BILITOT 1.7* 1.7*  PROT 5.3* 5.6*  ALBUMIN 2.3* 2.3*    Recent Labs  01/10/17 1028  LIPASE 22   CBC:  Recent Labs  01/10/17 1028  01/12/17 0238 01/13/17 0231  WBC 15.8*  < > 9.5 10.7*  NEUTROABS 13.6*  --   --   --   HGB 11.2*  < > 9.9* 9.9*  HCT 32.1*  < > 28.2* 27.9*  MCV 128.4*  < > 123.1* 121.8*  PLT 201  < > 142* 139*  < > = values in this interval not displayed. Cardiac Enzymes: No results for input(s): CKTOTAL, CKMB, CKMBINDEX, TROPONINI in the last 72 hours. BNP: Invalid input(s): POCBNP D-Dimer: No results for input(s): DDIMER in the last 72 hours. Hemoglobin A1C: No results for input(s): HGBA1C in the last 72  hours. Fasting Lipid Panel: No results for input(s): CHOL, HDL, LDLCALC, TRIG, CHOLHDL, LDLDIRECT in the last 72 hours. Thyroid Function Tests:  Recent Labs  01/10/17 1251  TSH 5.310*   Anemia Panel:  Recent Labs  01/12/17 1208  VITAMINB12 373  FOLATE 2.9*  FERRITIN 1,062*  TIBC 155*  IRON 101  RETICCTPCT 2.5    RADIOLOGY: Ct Abdomen Pelvis W Wo Contrast  Result Date: 01/12/2017 CLINICAL DATA:  Evaluate complex cyst identified on recent ultrasound. EXAM: CT ABDOMEN AND PELVIS WITHOUT AND WITH CONTRAST TECHNIQUE: Multidetector CT imaging of the abdomen and pelvis was performed following the standard protocol before and following the bolus administration of intravenous contrast. CONTRAST:  100mL ISOVUE-300 IOPAMIDOL (ISOVUE-300) INJECTION 61% COMPARISON:  None FINDINGS: Lower chest: No acute abnormality. Hepatobiliary: Hepatic steatosis. There is a diffuse changes of cirrhosis involving the liver. No enhancing liver abnormalities identified to   suggest hepatoma. The portal vein remains patent. Unremarkable appearance of the gallbladder. Pancreas: Normal appearance of the pancreas. Spleen: Normal in size without focal abnormality. Adrenals/Urinary Tract: Bilateral kidney lesions are identified. The complex midpole lesion arising from the lateral aspect of the right kidney is identified measuring 2.3 cm and 14 HU precontrast, image 78 of series 2. There is no significant enhancement within this lesion following the IV administration of contrast material with postcontrast Hounsfield units equal to 18.39. Hyperdense precontrast lesion arising from the mid left kidney measures 1.3 cm and 70 HU precontrast, image 64 of series 2. Postcontrast Hounsfield units equal 65.88. No internal septation or enhancing nodularity identified. No hydronephrosis identified. The urinary bladder appears within normal limits. Stomach/Bowel: The stomach is normal. The small bowel loops have a normal course and caliber.  No bowel obstruction. Unremarkable appearance of the colon. Vascular/Lymphatic: Aortic atherosclerosis. No aneurysm. No upper abdominal or pelvic adenopathy. No inguinal adenopathy noted. Reproductive: Prostate is unremarkable. Other: Moderate to large volume ascites identified within the abdomen and pelvis. Musculoskeletal: No acute or significant osseous findings. IMPRESSION: 1. 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 2. Morphologic features a liver compatible with cirrhosis. 3. Ascites. 4. Aortic atherosclerosis. Electronically Signed   By: Taylor  Stroud M.D.   On: 01/12/2017 13:22   X-ray Chest Pa And Lateral  Result Date: 01/11/2017 CLINICAL DATA:  Shortness of breath for 4 weeks EXAM: CHEST  2 VIEW COMPARISON:  01/10/2017 FINDINGS: Cardiomediastinal silhouette is stable. No infiltrate or pleural effusion. No pulmonary edema. Again noted degenerative changes thoracic spine. IMPRESSION: No active cardiopulmonary disease. Electronically Signed   By: Liviu  Pop M.D.   On: 01/11/2017 11:21   Ct Angio Chest Pe W Or Wo Contrast  Result Date: 01/10/2017 CLINICAL DATA:  Bilateral leg swelling, shortness of breath. EXAM: CT ANGIOGRAPHY CHEST WITH CONTRAST TECHNIQUE: Multidetector CT imaging of the chest was performed using the standard protocol during bolus administration of intravenous contrast. Multiplanar CT image reconstructions and MIPs were obtained to evaluate the vascular anatomy. CONTRAST:  70 cc Isovue 370 IV COMPARISON:  07/08/2014 FINDINGS: Cardiovascular: Improving clot burden within the pulmonary arteries. There is eccentric chronic clot within the left lower lobe segmental pulmonary artery, best seen on image 51 of series 4. Minimal residual filling defect noted within the right lower lobe pulmonary arterial branch on image 55. Note new areas of thromboembolism. Heart is normal size. Aorta is normal caliber. Extensive diffuse coronary artery  calcifications and scattered aortic calcifications. Mediastinum/Nodes: No mediastinal, hilar, or axillary adenopathy. Lungs/Pleura: Nodular area in the right upper lobe on image 41 is stable since prior study and compatible with scarring. Scarring in the right lower lobe, stable. No new airspace opacities or effusions. Upper Abdomen: Severe diffuse low-density throughout the liver compatible with fatty infiltration. Ascites noted adjacent to the liver and spleen. Musculoskeletal: Chest wall soft tissues are unremarkable. No acute bony abnormality. Review of the MIP images confirms the above findings. IMPRESSION: Partial resolution of the previously seen chronic pulmonary emboli. Mild residual eccentric chronic emboli noted in both lower lobes. No new/ acute pulmonary emboli. Advanced coronary artery disease. Severe low-density throughout the liver, likely fatty infiltration. Upper abdominal ascites adjacent to the liver and spleen. Electronically Signed   By: Kevin  Dover M.D.   On: 01/10/2017 16:47   Us Abdomen Complete  Result Date: 01/10/2017 CLINICAL DATA:  Abdominal swelling for 3 weeks EXAM: ABDOMEN ULTRASOUND COMPLETE COMPARISON:  None. FINDINGS: Gallbladder: No   gallstones are noted within gallbladder. There is some layering gallbladder sludge. No thickening of gallbladder wall. No sonographic Murphy's sign. Common bile duct: Diameter: 3.5 mm in diameter within normal limits. Liver: No focal hepatic mass. There is diffuse increased echogenicity of the liver suspicious for fatty infiltration or chronic liver disease. IVC: Not well seen due to bowel gas and abdominal ascites. Pancreas: Not well seen due to abdominal ascites. Spleen: Size and appearance within normal limits. Measures 7 cm in length. Right Kidney: Length: 13.5 cm in length. No hydronephrosis or renal calculi. There is a complex possible cystic hypoechoic exophytic lesion in midpole of the left kidney measures 2.2 x 2.2 cm further correlation  with enhanced CT or MRI is recommended to exclude a complex enhancing lesion. Left Kidney: Length: 12.7 cm. No hydronephrosis or renal calculi. A cyst in midpole measures 1.8 x 1.2 cm. Abdominal aorta: No aneurysm visualized. Measures up to 2.4 cm in diameter. Other findings: Abdominal ascites is noted. IMPRESSION: 1. No gallstones are noted within gallbladder. Small layering gallbladder sludge. Normal CBD. No sonographic Murphy's sign. 2. Heterogeneous increased echogenicity of the liver which may be due to fatty infiltration or chronic liver disease. 3. No hydronephrosis. There is a complex exophytic lesion midpole of the right kidney measures 2.2 x 2.2 cm. Further evaluation with enhanced CT or MRI is recommended to exclude complex cystic neoplasm. 4. No left hydronephrosis. A cyst in midpole of the left kidney measures 1.8 x 1.2 cm. 5. No aortic aneurysm.  Abdominal ascites is noted. Electronically Signed   By: Liviu  Pop M.D.   On: 01/10/2017 16:06   Us Paracentesis  Result Date: 01/11/2017 INDICATION: Ascites of unknown etiology. Request is made for diagnostic and therapeutic paracentesis. EXAM: ULTRASOUND GUIDED DIAGNOSTIC AND THERAPEUTIC PARACENTESIS MEDICATIONS: 1% lidocaine COMPLICATIONS: None immediate. PROCEDURE: Informed written consent was obtained from the patient after a discussion of the risks, benefits and alternatives to treatment. A timeout was performed prior to the initiation of the procedure. Initial ultrasound scanning demonstrates a moderate amount of ascites within the right lower abdominal quadrant. The right lower abdomen was prepped and draped in the usual sterile fashion. 1% lidocaine was used for local anesthesia. Following this, a 19 gauge, 7-cm, Yueh catheter was introduced. An ultrasound image was saved for documentation purposes. The paracentesis was performed. The catheter was removed and a dressing was applied. The patient tolerated the procedure well without immediate post  procedural complication. FINDINGS: A total of approximately 2.7 L of yellow fluid was removed. Samples were sent to the laboratory as requested by the clinical team. IMPRESSION: Successful ultrasound-guided paracentesis yielding 2.7 liters of peritoneal fluid. Read by: Kelly Osborne, PA-C Electronically Signed   By: M.  Shick M.D.   On: 01/11/2017 11:38   Dg Chest Port 1 View  Result Date: 01/10/2017 CLINICAL DATA:  Shortness of Breath EXAM: PORTABLE CHEST 1 VIEW COMPARISON:  04/20/2016 FINDINGS: Heart and mediastinal contours are within normal limits. No focal opacities or effusions. No acute bony abnormality. IMPRESSION: No active disease. Electronically Signed   By: Kevin  Dover M.D.   On: 01/10/2017 10:53    PHYSICAL EXAM General: NAD Neck: No JVD, no thyromegaly or thyroid nodule.  Lungs: Clear to auscultation bilaterally with normal respiratory effort. CV: Nondisplaced PMI.  Heart mildly tachy, regular S1/S2, no S3/S4, no murmur.  1+ ankle edema.   Abdomen: Soft, nontender, no hepatosplenomegaly, mild distention but soft.  Neurologic: Alert and oriented x 3.  Psych: Normal affect. Extremities: No   clubbing or cyanosis.   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  Found to have ascites and evidence for RV dysfunction by echo as well as at least moderate pulmonary hypertension.  1. RV failure/pulmonary hypertension: Echo 2/18 with EF 60-65%, moderately dilated RV with normal systolic function, PASP 57, mild TR, no mention made of ASD.  Patient's presentation certainly could be primarily due to RV failure with hepatic congestion, ascites, and peripheral edema.  I am concerned for significant pulmonary hypertension that may be causing the RV failure.  In this  situation, pulmonary hypertension could come from his secundum ASD, from chronic thromboembolic disease, and/or from chronic liver disease/cirrhosis (portopulmonary hypertension). On exam, he has peripheral edema and mild abdominal distention but no JVD.  - BP soft, would add low dose spironolactone given ascites, start with 12.5 mg daily.  - He will need right and left heart cath (has significant coronary calcification on CT, so would check coronaries also, especially if he may have possible surgery in the future => pulmonary thromboendarterectomy).  Hold warfarin, cover with heparin gtt when INR < 2.  Depending on findings, may need ASD closure or treatment of chronic PE (ultimate treatment would be pulmonary thromboendarterectomy).  Will do cath today if INR < 2, repeat INR at noon.  2. 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  Never had outpatient cardiology followup. He will need re-evaluation of ASD by TEE and cath.  Will arrange for TEE today, possible cath if INR < 2 when repeated.   3. Ascites: Possible hepatic congestion from RV failure versus ETOH cirrhosis. Imaging showed fatty liver and possible cirrhosis.  HCV negative, no ETOH x 16 years.  4. 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  Continue anticoagulation.  5. Atrial fibrillation: Paroxysmal.  He is currently in NSR.   Zosia Lucchese 01/13/2017 8:09 AM     

## 2017-01-13 NOTE — Progress Notes (Signed)
Physical Therapy Treatment Patient Details Name: Jeremy Herman MRN: [id number] DOB: 09/07/1946 Today's Date: 01/13/2017    History of Present Illness 71 y.o. male.With past medical history significant for  paroxysmal atrial fibrillation and [name], is on chronic anticoagulation came to ED with complaint of worsening shortness of breath lightheadedness and dizziness with ambulation, along with bilateral leg swelling and abdominal swelling for past 3 weeks. Pt with ascites and evidence for RV dysfunction by echo as well as at least moderate pulmonary hypertension, planned cardiac cath 2/16    PT Comments    Pt continues to be limited by orthostatic BP despite increased time and initial improvement with 3 min standing. Pt educated for bil UE HEP eOB and increased time with transfers to attempt to allow BP to regulate for activity. Ambulation limited by hypotension and unable to progress further. Will continue to follow and recommend supervision for D/C particularly until BP issues resolved. Encouraged HEP   Orthostatic BPs  Supine 99/66  Sitting 90/60, HR 125  Standing initially 69/50, HR 129  Standing after 3 min 86/56, HR 137  After ambulation 10' 84/45, HR 122    After 2nd ambulation 15' 62/53, HR 108   Follow Up Recommendations  Home health PT;Supervision/Assistance - 24 hour     Equipment Recommendations  None recommended by PT    Recommendations for Other Services       Precautions / Restrictions Precautions Precautions: Fall    Mobility  Bed Mobility Overal bed mobility: Modified Independent                Transfers Overall transfer level: Needs assistance   Transfers: Sit to/from Stand Sit to Stand: Supervision         General transfer comment: supervision for safety to monitor BP  Ambulation/Gait Ambulation/Gait assistance: Min guard Ambulation Distance (Feet): 10 Feet Assistive device: Rolling walker (2 wheeled)     Gait velocity  interpretation: Below normal speed for age/gender General Gait Details: limited by drop in BP with gait despite standing and elevated bP after 3 min PT walked 10' then 15' after seated rest   Stairs            Wheelchair Mobility    Modified Rankin (Stroke Patients Only)       Balance     Sitting balance-Leahy Scale: Good       Standing balance-Leahy Scale: Good                      Cognition Arousal/Alertness: Awake/alert Behavior During Therapy: WFL for tasks assessed/performed Overall Cognitive Status: Within Functional Limits for tasks assessed                      Exercises General Exercises - Lower Extremity Long Arc Quad: AROM;5 reps;Seated;Both Hip Flexion/Marching: AROM;Both;Seated;5 reps    General Comments        Pertinent Vitals/Pain Pain Assessment: No/denies pain    Home Living                      Prior Function            PT Goals (current goals can now be found in the care plan section) Progress towards PT goals: Progressing toward goals (limited by hypotension)    Frequency           PT Plan Current plan remains appropriate    Co-evaluation  End of Session Equipment Utilized During Treatment: Gait belt Activity Tolerance: Treatment limited secondary to medical complications (Comment) Patient left: in chair;with call bell/phone within reach     Time: 0759-0830 PT Time Calculation (min) (ACUTE ONLY): 31 min  Charges:  $Therapeutic Activity: 23-37 mins                    G Codes:      Amyri Frenz B Hendrik Donath 01/13/2017, 8:39 AM  Delaney MeigsMaija Tabor Shivaun Bilello, PT [phone]

## 2017-01-13 NOTE — Progress Notes (Signed)
Subjective: Patient was feeling better this morning. Still feeling short of breath and dizzy when standing. Will have cardiac cath and TEE today.   Objective:  Vital signs in last 24 hours: Vitals:   01/13/17 1800 01/13/17 1834 01/13/17 1900 01/13/17 2124  BP: (!) 100/59 102/62 (!) 99/59 90/60  Pulse: 90 99 (!) 117 (!) 102  Resp:    18  Temp:    98.8 F (37.1 C)  TempSrc:    Oral  SpO2:    97%  Weight:      Height:       Physical Exam  Constitutional: He appears well-developed and well-nourished.  Neck: No JVD present.  Cardiovascular: Normal rate and regular rhythm.   Pulmonary/Chest: Effort normal and breath sounds normal.  Musculoskeletal:  +2 pitting edema, L > R leg edema   CBC Latest Ref Rng & Units 01/13/2017 01/12/2017 01/11/2017  WBC 4.0 - 10.5 K/uL 10.7(H) 9.5 13.5(H)  Hemoglobin 13.0 - 17.0 g/dL 1.6(X) 0.9(U) 0.4(V)  Hematocrit 39.0 - 52.0 % 27.9(L) 28.2(L) 28.6(L)  Platelets 150 - 400 K/uL 139(L) 142(L) 188   CMP Latest Ref Rng & Units 01/13/2017 01/12/2017 01/11/2017  Glucose 65 - 99 mg/dL 409(W) 119(J) 478(G)  BUN 6 - 20 mg/dL 8 11 12   Creatinine 0.61 - 1.24 mg/dL 9.56(O) 1.30(Q) 6.57  Sodium 135 - 145 mmol/L 132(L) 134(L) 136  Potassium 3.5 - 5.1 mmol/L 3.3(L) 3.5 3.8  Chloride 101 - 111 mmol/L 100(L) 100(L) 100(L)  CO2 22 - 32 mmol/L 25 26 23   Calcium 8.9 - 10.3 mg/dL 7.9(L) 7.9(L) 8.0(L)  Total Protein 6.5 - 8.1 g/dL 8.4(O) 5.3(L) 5.8(L)  Total Bilirubin 0.3 - 1.2 mg/dL 9.6(E) 9.5(M) 2.1(H)  Alkaline Phos 38 - 126 U/L 86 89 99  AST 15 - 41 U/L 109(H) 89(H) 94(H)  ALT 17 - 63 U/L 58 53 57   Iron/TIBC/Ferritin/ %Sat    Component Value Date/Time   IRON 101 01/12/2017 1208   TIBC 155 (L) 01/12/2017 1208   FERRITIN 1,062 (H) 01/12/2017 1208   IRONPCTSAT 65 (H) 01/12/2017 1208   IRONPCTSAT NOT CALC 07/28/2014 1023   Folate: 2.9  B12: 373  Hep C: Negative  HIV: Nonreactive  Urinalysis    Component Value Date/Time   COLORURINE YELLOW 01/12/2017  0158   APPEARANCEUR CLEAR 01/12/2017 0158   LABSPEC 1.019 01/12/2017 0158   PHURINE 7.0 01/12/2017 0158   GLUCOSEU NEGATIVE 01/12/2017 0158   HGBUR NEGATIVE 01/12/2017 0158   BILIRUBINUR NEGATIVE 01/12/2017 0158   KETONESUR NEGATIVE 01/12/2017 0158   PROTEINUR NEGATIVE 01/12/2017 0158   UROBILINOGEN 1.0 12/26/2010 0531   NITRITE NEGATIVE 01/12/2017 0158   LEUKOCYTESUR NEGATIVE 01/12/2017 0158   Ascites fluid: Glucose. 117 LDH. 44 Total protein.<3.0 Albumin. 0.4 Serum Albumin.2.5 SAAG. 2.1 Mesothelial Cells present Few WBC present. Negative Gram Stain   Assessment/Plan:  Active Problems:   Chronic pulmonary embolism (HCC)   Ascites   Pulmonary embolus (HCC)   Other ascites   Shortness of breath   SOB (shortness of breath)   Pulmonary hypertension, primary (HCC)   RVF (right ventricular failure) (HCC)   Ascites due to alcoholic cirrhosis (HCC)   Atrial septal defect, secundum  71 y.o.malewith h/o paroxysmal AFib,[name],who presented with worsening shortness of breath lightheadedness and dizziness with ambulation,along with bilateral leg swelling and abdominal swellingfor past3 weeks.  Pulmonary HTN w/ hx of chronic PE and ASD Patient made [place] aware that he was told to have an ASD. Looking through  the chart, patient was told to have a large ASD but never followed up with cardiology for it. Patient will have RHC, LHC, and TEE today. -- RHC/LHC & TEE today -- start heparin GTT once INR is below 2  Liver Cirrhosis  Liver imaging is suggestive of liver cirrhosis. Patient has multiple potential contributing factors. Both U/S and CT showed features of fatty liver. Patient also has a history of alcohol abuse, although patient says he stopped drinking 16 years ago. While working up anemia, patient was found to have very high % saturation and ferritin suggestive of hemochromatosis. Patient will likely need liver biopsy to confirm etiology of cirrhosis. Will  check Hep B to r/o Cirrhosis 2/2 Hep B.  -- Hep B pending -- cont spironolactone 12.5 mg QD  Anemia Patient came in with hgb of 9.9 with MCV in the 120s. We worked up anemia and found along with abnormal iron studies detailed above low folate and lower normal B12. Will check MMA to see if patient is B12 deficient.  -- started Folic acid supplements -- MMA pending   Complex Cyst of Kidney CT abdomen w/ & w/o contrast reveal that Kidney cyst are Bosniak category 1 & 2 -- f/u outpatient  Subclinical Hypothyroidism T4 is normal. No need to treat at this time.   Fluids: None Diet:Regular Diet DVT Prophylaxis: SQH Code Status: FULL  Dispo: Anticipated discharge pending clinical course   Caryn BeeJohn A Annette Bertelson, Medical Student 01/13/2017, 1:07 PM Pager: [phone]

## 2017-01-13 NOTE — Progress Notes (Addendum)
Subjective: Patient was feeling better this morning. No new complaints. He was nothing by mouth and awaiting for possible cardiac cath and TEE today.  Objective:  Vital signs in last 24 hours: Vitals:   01/12/17 1320 01/12/17 2047 01/13/17 0413 01/13/17 0805  BP: 118/90 (!) 90/59 (!) 85/66 90/60  Pulse: 91 98 (!) 118   Resp:  18 18   Temp: 97.7 F (36.5 C) 98.3 F (36.8 C) 98.4 F (36.9 C)   TempSrc: Oral Oral Oral   SpO2: 97% 98% 94%   Weight:      Height:       Gen.Well-built developed, little emaciatedman, in no acute distress. Chest.Clear bilaterally. CVS. Regular rate and rhythm, hard to assess heart sounds. No JVD. Abdomen. Soft, distended,( little more as compared to yesterday) nontender, bowel sounds positive. Extremities. Bilateral 2+ pitting edema up to mid calves, slightly more prominent on left leg.  Labs. CMP Latest Ref Rng & Units 01/13/2017 01/12/2017 01/11/2017  Glucose 65 - 99 mg/dL 161(W123(H) 960(A132(H) 540(J109(H)  BUN 6 - 20 mg/dL 8 11 12   Creatinine 0.61 - 1.24 mg/dL 8.11(B0.56(L) 1.47(W0.56(L) 2.950.71  Sodium 135 - 145 mmol/L 132(L) 134(L) 136  Potassium 3.5 - 5.1 mmol/L 3.3(L) 3.5 3.8  Chloride 101 - 111 mmol/L 100(L) 100(L) 100(L)  CO2 22 - 32 mmol/L 25 26 23   Calcium 8.9 - 10.3 mg/dL 7.9(L) 7.9(L) 8.0(L)  Total Protein 6.5 - 8.1 g/dL 6.2(Z5.6(L) 5.3(L) 5.8(L)  Total Bilirubin 0.3 - 1.2 mg/dL 3.0(Q1.7(H) 6.5(H1.7(H) 2.1(H)  Alkaline Phos 38 - 126 U/L 86 89 99  AST 15 - 41 U/L 109(H) 89(H) 94(H)  ALT 17 - 63 U/L 58 53 57   CBC Latest Ref Rng & Units 01/13/2017 01/12/2017 01/11/2017  WBC 4.0 - 10.5 K/uL 10.7(H) 9.5 13.5(H)  Hemoglobin 13.0 - 17.0 g/dL 8.4(O9.9(L) 9.6(E9.9(L) 9.5(M9.9(L)  Hematocrit 39.0 - 52.0 % 27.9(L) 28.2(L) 28.6(L)  Platelets 150 - 400 K/uL 139(L) 142(L) 188   Iron/TIBC/Ferritin/ %Sat    Component Value Date/Time   IRON 101 01/12/2017 1208   TIBC 155 (L) 01/12/2017 1208   FERRITIN 1,062 (H) 01/12/2017 1208   IRONPCTSAT 65 (H) 01/12/2017 1208   IRONPCTSAT NOT CALC 07/28/2014  1023   Urinalysis    Component Value Date/Time   COLORURINE YELLOW 01/12/2017 0158   APPEARANCEUR CLEAR 01/12/2017 0158   LABSPEC 1.019 01/12/2017 0158   PHURINE 7.0 01/12/2017 0158   GLUCOSEU NEGATIVE 01/12/2017 0158   HGBUR NEGATIVE 01/12/2017 0158   BILIRUBINUR NEGATIVE 01/12/2017 0158   KETONESUR NEGATIVE 01/12/2017 0158   PROTEINUR NEGATIVE 01/12/2017 0158   UROBILINOGEN 1.0 12/26/2010 0531   NITRITE NEGATIVE 01/12/2017 0158   LEUKOCYTESUR NEGATIVE 01/12/2017 0158    Folate. 2.9 B12. 373 Hep C . Negative HIV. Nonreactive  Ascites fluid. Glucose. 117 LDH. 44 Total protein.<3.0 Albumin. 0.4 Serum Albumin.2.3 SAAG. 1.9  Body fluid cell count with differential  Order: [id number]  Status:  Final result Visible to patient:  No (Not Released) Next appt:  None   Ref Range & Units 2d ago  Fluid Type-FCT  Peritoneal   Color, Fluid YELLOW YELLOW    Appearance, Fluid CLEAR HAZY    WBC, Fluid 0 - 1,000 cu mm 85   Neutrophil Count, Fluid 0 - 25 % 6   Lymphs, Fluid % 15   Monocyte-Macrophage-Serous Fluid 50 - 90 % 79   Eos, Fluid % 0   Other Cells, Fluid % MESOTHELIAL CELLS PRESENT        Gram  stain  Order: [id number]  Status:  Final result Visible to patient:  No (Not Released) Next appt:  None   2d ago  Specimen Description FLUID PLEURAL   Special Requests NONE   Gram Stain FEW WBC PRESENT,BOTH PMN AND MONONUCLEAR  NO ORGANISMS SEEN      Report Status 01/12/2017 FINAL        Fluid cytology. Diagnosis PERITONEAL/ASCITIC FLUID (SPECIMEN 1 OF 1, COLLECTED 01/11/17): REACTIVE MESOTHELIAL CELLS PRESENT.  Assessment/Plan:  Jeremy Herman a 70 y.o.male.With past medical history significant for paroxysmal atrial fibrillation and [name], is on chronic anticoagulation came to ED with complaint of worsening shortness of breath lightheadedness and dizziness with ambulation,along with bilateral leg swelling and abdominal swellingfor past3  weeks.  Pulmonary hypertension with history of chronic PE and ASD. -Going for right and left cardiac cath and TEE today.  Ascites. His ascites with SAAG of 1.9, and rest of labs. consistent with portal hypertension, his liver cirrhosis and pulmonary hypertension leading to right heart failure both can be contributory.  Liver cirrhosis. He has a past medical history of alcohol abuse, stopped drinking 16 years ago. Anemia panel is suggestive of hemochromatosis and folate deficiency. B12 within lower normal limits. -Check MMA -Hep B panel. -He will need liver biopsy for final diagnosis. -Folic acid supplements. -Continue spironolactone at 12.5 mg daily.  Hypotension with positive orthostasis. Most likely because of intravascular volume depletion. -Might need some IV fluids, that can also increase his ascites and peripheral edema because of his low albumin.  Subclinical hypothyroidism.No need for any intervention at this time.  H/O PAF. Currently sinus rhythm. -Continue anticoagulation after cardiac catheterization.  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 Most likely benign. -Can be followed up as an outpatient.   Dispo: Anticipated discharge in approximately 2-3 day(s).   [name], MD 01/13/2017, 11:13 AM Pager: [phone]

## 2017-01-13 NOTE — Progress Notes (Signed)
Internal Medicine Attending:   I saw and examined the patient. I reviewed the resident's note and I agree with the resident's findings and plan as documented in the resident's note.  Patient feels a little better this morning and had no new complaints. 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 He is also now status post cardiac catheterization which showed only mild pulmonary hypertension and normal filling pressures of the heart as well as a large ASD. We'll resume anticoagulation today for chronic PE. The etiology of cirrhosis remains uncertain at this time. It is possible that it is secondary to his alcohol use but he quit 16 years ago. It is also possible that it is secondary to hemachromatosis given his elevated ferritin with an elevated iron saturation. He will need outpatient follow-up with GI and possible liver biopsy to confirm this diagnosis.

## 2017-01-13 NOTE — Progress Notes (Signed)
  Echocardiogram Echocardiogram Transesophageal has been performed.  [name], [name] R 01/13/2017, 1:09 PM

## 2017-01-13 NOTE — Interval H&P Note (Signed)
History and Physical Interval Note:  01/13/2017 1:34 PM  Jeremy Herman  has presented today for surgery, with the diagnosis of hf  The various methods of treatment have been discussed with the patient and family. After consideration of risks, benefits and other options for treatment, the patient has consented to  Procedure(s): Right/Left Heart Cath and Coronary Angiography (N/A) as a surgical intervention .  The patient's history has been reviewed, patient examined, no change in status, stable for surgery.  I have reviewed the patient's chart and labs.  Questions were answered to the patient's satisfaction.     Dalton [employer]

## 2017-01-13 NOTE — Progress Notes (Signed)
Patient ID: Jeremy Herman, male   DOB: 1946-10-22, 71 y.o.   MRN: [id number]   SUBJECTIVE: No complaints this morning, walked to bathroom without dyspnea.    Scheduled Meds: . diltiazem  180 mg Oral Daily  . folic acid  1 mg Oral Daily  . sodium chloride flush  3 mL Intravenous Q12H  . spironolactone  12.5 mg Oral Daily   Continuous Infusions: . sodium chloride    . sodium chloride 10 mL/hr at 01/13/17 0609   PRN Meds:.metoprolol, senna-docusate    Vitals:   01/12/17 0643 01/12/17 1320 01/12/17 2047 01/13/17 0413  BP: 92/60 118/90 (!) 90/59 (!) 85/66  Pulse:  91 98 (!) 118  Resp:   18 18  Temp:  97.7 F (36.5 C) 98.3 F (36.8 C) 98.4 F (36.9 C)  TempSrc:  Oral Oral Oral  SpO2:  97% 98% 94%  Weight:      Height:        Intake/Output Summary (Last 24 hours) at 01/13/17 0805 Last data filed at 01/13/17 0001  Gross per 24 hour  Intake              360 ml  Output              500 ml  Net             -140 ml    LABS: Basic Metabolic Panel:  Recent Labs  08/65/78 0238 01/13/17 0231  NA 134* 132*  K 3.5 3.3*  CL 100* 100*  CO2 26 25  GLUCOSE 132* 123*  BUN 11 8  CREATININE 0.56* 0.56*  CALCIUM 7.9* 7.9*   Liver Function Tests:  Recent Labs  01/12/17 0238 01/13/17 0231  AST 89* 109*  ALT 53 58  ALKPHOS 89 86  BILITOT 1.7* 1.7*  PROT 5.3* 5.6*  ALBUMIN 2.3* 2.3*    Recent Labs  01/10/17 1028  LIPASE 22   CBC:  Recent Labs  01/10/17 1028  01/12/17 0238 01/13/17 0231  WBC 15.8*  < > 9.5 10.7*  NEUTROABS 13.6*  --   --   --   HGB 11.2*  < > 9.9* 9.9*  HCT 32.1*  < > 28.2* 27.9*  MCV 128.4*  < > 123.1* 121.8*  PLT 201  < > 142* 139*  < > = values in this interval not displayed. Cardiac Enzymes: No results for input(s): CKTOTAL, CKMB, CKMBINDEX, TROPONINI in the last 72 hours. BNP: Invalid input(s): POCBNP D-Dimer: No results for input(s): DDIMER in the last 72 hours. Hemoglobin A1C: No results for input(s): HGBA1C in the last 72  hours. Fasting Lipid Panel: No results for input(s): CHOL, HDL, LDLCALC, TRIG, CHOLHDL, LDLDIRECT in the last 72 hours. Thyroid Function Tests:  Recent Labs  01/10/17 1251  TSH 5.310*   Anemia Panel:  Recent Labs  01/12/17 1208  VITAMINB12 373  FOLATE 2.9*  FERRITIN 1,062*  TIBC 155*  IRON 101  RETICCTPCT 2.5    RADIOLOGY: Ct Abdomen Pelvis W Wo Contrast  Result Date: 01/12/2017 CLINICAL DATA:  Evaluate complex cyst identified on recent ultrasound. EXAM: CT ABDOMEN AND PELVIS WITHOUT AND WITH CONTRAST TECHNIQUE: Multidetector CT imaging of the abdomen and pelvis was performed following the standard protocol before and following the bolus administration of intravenous contrast. CONTRAST:  ISOVUE-300 IOPAMIDOL (ISOVUE-300) INJECTION 61% COMPARISON:  None FINDINGS: Lower chest: No acute abnormality. Hepatobiliary: Hepatic steatosis. There is a diffuse changes of cirrhosis involving the liver. No enhancing liver abnormalities identified to  suggest hepatoma. The portal vein remains patent. Unremarkable appearance of the gallbladder. Pancreas: Normal appearance of the pancreas. Spleen: Normal in size without focal abnormality. Adrenals/Urinary Tract: Bilateral kidney lesions are identified. The complex midpole lesion arising from the lateral aspect of the right kidney is identified measuring 2.3 cm and 14 HU precontrast, image 78 of series 2. There is no significant enhancement within this lesion following the IV administration of contrast material with postcontrast Hounsfield units equal to 18.39. Hyperdense precontrast lesion arising from the mid left kidney measures 1.3 cm and 70 HU precontrast, image 64 of series 2. Postcontrast Hounsfield units equal 65.88. No internal septation or enhancing nodularity identified. No hydronephrosis identified. The urinary bladder appears within normal limits. Stomach/Bowel: The stomach is normal. The small bowel loops have a normal course and caliber.  No bowel obstruction. Unremarkable appearance of the colon. Vascular/Lymphatic: Aortic atherosclerosis. No aneurysm. No upper abdominal or pelvic adenopathy. No inguinal adenopathy noted. Reproductive: Prostate is unremarkable. Other: Moderate to large volume ascites identified within the abdomen and pelvis. Musculoskeletal: No acute or significant osseous findings. IMPRESSION: 1. 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 2. Morphologic features a liver compatible with cirrhosis. 3. Ascites. 4. Aortic atherosclerosis. Electronically Signed   By: Signa Kell M.D.   On: 01/12/2017 13:22   X-ray Chest Pa And Lateral  Result Date: 01/11/2017 CLINICAL DATA:  Shortness of breath for 4 weeks EXAM: CHEST  2 VIEW COMPARISON:  01/10/2017 FINDINGS: Cardiomediastinal silhouette is stable. No infiltrate or pleural effusion. No pulmonary edema. Again noted degenerative changes thoracic spine. IMPRESSION: No active cardiopulmonary disease. Electronically Signed   By: Natasha Mead M.D.   On: 01/11/2017 11:21   Ct Angio Chest Pe W Or Wo Contrast  Result Date: 01/10/2017 CLINICAL DATA:  Bilateral leg swelling, shortness of breath. EXAM: CT ANGIOGRAPHY CHEST WITH CONTRAST TECHNIQUE: Multidetector CT imaging of the chest was performed using the standard protocol during bolus administration of intravenous contrast. Multiplanar CT image reconstructions and MIPs were obtained to evaluate the vascular anatomy. CONTRAST:  70 cc Isovue 370 IV COMPARISON:  07/08/2014 FINDINGS: Cardiovascular: Improving clot burden within the pulmonary arteries. There is eccentric chronic clot within the left lower lobe segmental pulmonary artery, best seen on image 51 of series 4. Minimal residual filling defect noted within the right lower lobe pulmonary arterial branch on image 55. Note new areas of thromboembolism. Heart is normal size. Aorta is normal caliber. Extensive diffuse coronary artery  calcifications and scattered aortic calcifications. Mediastinum/Nodes: No mediastinal, hilar, or axillary adenopathy. Lungs/Pleura: Nodular area in the right upper lobe on image 41 is stable since prior study and compatible with scarring. Scarring in the right lower lobe, stable. No new airspace opacities or effusions. Upper Abdomen: Severe diffuse low-density throughout the liver compatible with fatty infiltration. Ascites noted adjacent to the liver and spleen. Musculoskeletal: Chest wall soft tissues are unremarkable. No acute bony abnormality. Review of the MIP images confirms the above findings. IMPRESSION: Partial resolution of the previously seen chronic pulmonary emboli. Mild residual eccentric chronic emboli noted in both lower lobes. No new/ acute pulmonary emboli. Advanced coronary artery disease. Severe low-density throughout the liver, likely fatty infiltration. Upper abdominal ascites adjacent to the liver and spleen. Electronically Signed   By: Charlett Nose M.D.   On: 01/10/2017 16:47   US Abdomen Complete  Result Date: 01/10/2017 CLINICAL DATA:  Abdominal swelling for 3 weeks EXAM: ABDOMEN ULTRASOUND COMPLETE COMPARISON:  None. FINDINGS: Gallbladder: No  gallstones are noted within gallbladder. There is some layering gallbladder sludge. No thickening of gallbladder wall. No sonographic Murphy's sign. Common bile duct: Diameter: 3.5 mm in diameter within normal limits. Liver: No focal hepatic mass. There is diffuse increased echogenicity of the liver suspicious for fatty infiltration or chronic liver disease. IVC: Not well seen due to bowel gas and abdominal ascites. Pancreas: Not well seen due to abdominal ascites. Spleen: Size and appearance within normal limits. Measures 7 cm in length. Right Kidney: Length: 13.5 cm in length. No hydronephrosis or renal calculi. There is a complex possible cystic hypoechoic exophytic lesion in midpole of the left kidney measures 2.2 x 2.2 cm further correlation  with enhanced CT or MRI is recommended to exclude a complex enhancing lesion. Left Kidney: Length: 12.7 cm. No hydronephrosis or renal calculi. A cyst in midpole measures 1.8 x 1.2 cm. Abdominal aorta: No aneurysm visualized. Measures up to 2.4 cm in diameter. Other findings: Abdominal ascites is noted. IMPRESSION: 1. No gallstones are noted within gallbladder. Small layering gallbladder sludge. Normal CBD. No sonographic Murphy's sign. 2. Heterogeneous increased echogenicity of the liver which may be due to fatty infiltration or chronic liver disease. 3. No hydronephrosis. There is a complex exophytic lesion midpole of the right kidney measures 2.2 x 2.2 cm. Further evaluation with enhanced CT or MRI is recommended to exclude complex cystic neoplasm. 4. No left hydronephrosis. A cyst in midpole of the left kidney measures 1.8 x 1.2 cm. 5. No aortic aneurysm.  Abdominal ascites is noted. Electronically Signed   By: Natasha Mead M.D.   On: 01/10/2017 16:06   US Paracentesis  Result Date: 01/11/2017 INDICATION: Ascites of unknown etiology. Request is made for diagnostic and therapeutic paracentesis. EXAM: ULTRASOUND GUIDED DIAGNOSTIC AND THERAPEUTIC PARACENTESIS MEDICATIONS: 1% lidocaine COMPLICATIONS: None immediate. PROCEDURE: Informed written consent was obtained from the patient after a discussion of the risks, benefits and alternatives to treatment. A timeout was performed prior to the initiation of the procedure. Initial ultrasound scanning demonstrates a moderate amount of ascites within the right lower abdominal quadrant. The right lower abdomen was prepped and draped in the usual sterile fashion. 1% lidocaine was used for local anesthesia. Following this, a 19 gauge, 7-cm, Yueh catheter was introduced. An ultrasound image was saved for documentation purposes. The paracentesis was performed. The catheter was removed and a dressing was applied. The patient tolerated the procedure well without immediate post  procedural complication. FINDINGS: A total of approximately 2.7 L of yellow fluid was removed. Samples were sent to the laboratory as requested by the clinical team. IMPRESSION: Successful ultrasound-guided paracentesis yielding 2.7 liters of peritoneal fluid. Read by: Barnetta Chapel, PA-C Electronically Signed   By: Judie Petit.  Shick M.D.   On: 01/11/2017 11:38   Dg Chest Port 1 View  Result Date: 01/10/2017 CLINICAL DATA:  Shortness of Breath EXAM: PORTABLE CHEST 1 VIEW COMPARISON:  04/20/2016 FINDINGS: Heart and mediastinal contours are within normal limits. No focal opacities or effusions. No acute bony abnormality. IMPRESSION: No active disease. Electronically Signed   By: Charlett Nose M.D.   On: 01/10/2017 10:53    PHYSICAL EXAM General: NAD Neck: No JVD, no thyromegaly or thyroid nodule.  Lungs: Clear to auscultation bilaterally with normal respiratory effort. CV: Nondisplaced PMI.  Heart mildly tachy, regular S1/S2, no S3/S4, no murmur.  1+ ankle edema.   Abdomen: Soft, nontender, no hepatosplenomegaly, mild distention but soft.  Neurologic: Alert and oriented x 3.  Psych: Normal affect. Extremities: No  clubbing or cyanosis.   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  Found to have ascites and evidence for RV dysfunction by echo as well as at least moderate pulmonary hypertension.  1. RV failure/pulmonary hypertension: Echo 2/18 with EF 60-65%, moderately dilated RV with normal systolic function, PASP 57, mild TR, no mention made of ASD.  Patient's presentation certainly could be primarily due to RV failure with hepatic congestion, ascites, and peripheral edema.  I am concerned for significant pulmonary hypertension that may be causing the RV failure.  In this  situation, pulmonary hypertension could come from his secundum ASD, from chronic thromboembolic disease, and/or from chronic liver disease/cirrhosis (portopulmonary hypertension). On exam, he has peripheral edema and mild abdominal distention but no JVD.  - BP soft, would add low dose spironolactone given ascites, start with 12.5 mg daily.  - He will need right and left heart cath (has significant coronary calcification on CT, so would check coronaries also, especially if he may have possible surgery in the future => pulmonary thromboendarterectomy).  Hold warfarin, cover with heparin gtt when INR < 2.  Depending on findings, may need ASD closure or treatment of chronic PE (ultimate treatment would be pulmonary thromboendarterectomy).  Will do cath today if INR < 2, repeat INR at noon.  2. 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  Never had outpatient cardiology followup. He will need re-evaluation of ASD by TEE and cath.  Will arrange for TEE today, possible cath if INR < 2 when repeated.   3. Ascites: Possible hepatic congestion from RV failure versus ETOH cirrhosis. Imaging showed fatty liver and possible cirrhosis.  HCV negative, no ETOH x 16 years.  4. 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  Continue anticoagulation.  5. Atrial fibrillation: Paroxysmal.  He is currently in NSR.   Marca Ancona 01/13/2017 8:09 AM

## 2017-01-13 NOTE — Progress Notes (Signed)
Site area: left groin Site Prior to Removal:  Level 0 Pressure Applied For: 10 minutes Manual:   yes Patient Status During Pull:  stable Post Pull Site:  Level 0 Post Pull Instructions Given:  yes Post Pull Pulses Present: dopplered Dressing Applied:  Gauze and tegaderm Bedrest begins @ 1550 Comments:

## 2017-01-13 NOTE — CV Procedure (Signed)
Procedure: TEE  Indication: ASD  Sedation: Versed 3 mg IV, Fentanyl 25 mcg IV  Findings: Normal LV size with EF 55-60%.  Normal wall thickness.  No regional WMAs.  The right ventricle was mildly dilated with mildly decreased systolic function.  The right atrium was mildly dilated.  The left atrium was mildly dilated.  There was no LA appendage thrombus.  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  Moderate TR, peak RV-RA gradient 73 mmHg.  Mild mitral regurgitation.  Trileaflet aortic valve with no stenosis or regurgitation.  Normal caliber aorta with grade III-IV plaque in descending thoracic aorta.   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   Marca AnconaDalton Monifah Freehling 01/13/2017 12:54 PM

## 2017-01-13 NOTE — Interval H&P Note (Signed)
History and Physical Interval Note:  01/13/2017 12:28 PM  Jeremy Herman  has presented today for surgery, with the diagnosis of PULMONARY HYPERTENSION  The various methods of treatment have been discussed with the patient and family. After consideration of risks, benefits and other options for treatment, the patient has consented to  Procedure(s): TRANSESOPHAGEAL ECHOCARDIOGRAM (TEE) (N/A) as a surgical intervention .  The patient's history has been reviewed, patient examined, no change in status, stable for surgery.  I have reviewed the patient's chart and labs.  Questions were answered to the patient's satisfaction.     Brigida Scotti [employer]

## 2017-01-14 LAB — BASIC METABOLIC PANEL
ANION GAP: 10 (ref 5–15)
BUN: 7 mg/dL (ref 6–20)
CO2: 23 mmol/L (ref 22–32)
Calcium: 7.8 mg/dL — ABNORMAL LOW (ref 8.9–10.3)
Chloride: 101 mmol/L (ref 101–111)
Creatinine, Ser: 0.59 mg/dL — ABNORMAL LOW (ref 0.61–1.24)
Glucose, Bld: 117 mg/dL — ABNORMAL HIGH (ref 65–99)
Potassium: 3.6 mmol/L (ref 3.5–5.1)
Sodium: 134 mmol/L — ABNORMAL LOW (ref 135–145)

## 2017-01-14 LAB — CBC
HCT: 28.6 % — ABNORMAL LOW (ref 39.0–52.0)
HEMOGLOBIN: 10.2 g/dL — AB (ref 13.0–17.0)
MCH: 44.2 pg — ABNORMAL HIGH (ref 26.0–34.0)
MCHC: 35.7 g/dL (ref 30.0–36.0)
MCV: 123.8 fL — ABNORMAL HIGH (ref 78.0–100.0)
Platelets: 157 10*3/uL (ref 150–400)
RBC: 2.31 MIL/uL — AB (ref 4.22–5.81)
RDW: 15.7 % — ABNORMAL HIGH (ref 11.5–15.5)
WBC: 12.8 10*3/uL — ABNORMAL HIGH (ref 4.0–10.5)

## 2017-01-14 LAB — PROTIME-INR
INR: 1.6
PROTHROMBIN TIME: 19.2 s — AB (ref 11.4–15.2)

## 2017-01-14 LAB — HEPATITIS B CORE ANTIBODY, TOTAL: HEP B C TOTAL AB: NEGATIVE

## 2017-01-14 LAB — HEPATITIS B SURFACE ANTIGEN: HEP B S AG: NEGATIVE

## 2017-01-14 LAB — HEPARIN LEVEL (UNFRACTIONATED)
HEPARIN UNFRACTIONATED: 0.27 [IU]/mL — AB (ref 0.30–0.70)
Heparin Unfractionated: 0.6 IU/mL (ref 0.30–0.70)

## 2017-01-14 LAB — HEPATITIS B SURFACE ANTIBODY,QUALITATIVE: HEP B S AB: NONREACTIVE

## 2017-01-14 MED ORDER — HEPARIN BOLUS VIA INFUSION
1000.0000 [IU] | Freq: Once | INTRAVENOUS | Status: AC
  Administered 2017-01-14: 1000 [IU] via INTRAVENOUS
  Filled 2017-01-14: qty 1000

## 2017-01-14 MED ORDER — WARFARIN SODIUM 2 MG PO TABS
2.0000 mg | ORAL_TABLET | Freq: Once | ORAL | Status: AC
  Administered 2017-01-14: 2 mg via ORAL
  Filled 2017-01-14: qty 1

## 2017-01-14 NOTE — Progress Notes (Signed)
Patient ID: Jeremy PaganiniJames Herman, male   DOB: 16-May-1946, 71 y.o.   MRN: [id number]   SUBJECTIVE: No complaints this morning post cath    Scheduled Meds: . atorvastatin  20 mg Oral q1800  . diltiazem  180 mg Oral Daily  . folic acid  1 mg Oral Daily  . heparin  1,000 Units Intravenous Once  . sodium chloride flush  3 mL Intravenous Q12H  . sodium chloride flush  3 mL Intravenous Q12H  . spironolactone  12.5 mg Oral Daily  . Warfarin - Pharmacist Dosing Inpatient   Does not apply q1800   Continuous Infusions: . heparin 1,500 Units/hr (01/14/17 0845)   PRN Meds:.sodium chloride, acetaminophen, ondansetron (ZOFRAN) IV, senna-docusate, sodium chloride flush    Vitals:   01/13/17 1900 01/13/17 2124 01/14/17 0407 01/14/17 0850  BP: (!) 99/59 90/60 (!) 88/54 97/78  Pulse: (!) 117 (!) 102 (!) 113 (!) 106  Resp:  18 18   Temp:  98.8 F (37.1 C) 99.1 F (37.3 C) 98.2 F (36.8 C)  TempSrc:  Oral Oral Oral  SpO2:  97% 94% 98%  Weight:   156 lb 1.6 oz (70.8 kg)   Height:        Intake/Output Summary (Last 24 hours) at 01/14/17 1023 Last data filed at 01/14/17 0845  Gross per 24 hour  Intake              720 ml  Output                0 ml  Net              720 ml    LABS: Basic Metabolic Panel:  Recent Labs  32/44/0102/16/18 0231 01/14/17 0426  NA 132* 134*  K 3.3* 3.6  CL 100* 101  CO2 25 23  GLUCOSE 123* 117*  BUN 8 7  CREATININE 0.56* 0.59*  CALCIUM 7.9* 7.8*   Liver Function Tests:  Recent Labs  01/12/17 0238 01/13/17 0231  AST 89* 109*  ALT 53 58  ALKPHOS 89 86  BILITOT 1.7* 1.7*  PROT 5.3* 5.6*  ALBUMIN 2.3* 2.3*   No results for input(s): LIPASE, AMYLASE in the last 72 hours. CBC:  Recent Labs  01/13/17 0231 01/14/17 0426  WBC 10.7* 12.8*  HGB 9.9* 10.2*  HCT 27.9* 28.6*  MCV 121.8* 123.8*  PLT 139* 157   Cardiac Enzymes: No results for input(s): CKTOTAL, CKMB, CKMBINDEX, TROPONINI in the last 72 hours. BNP: Invalid input(s): POCBNP D-Dimer: No  results for input(s): DDIMER in the last 72 hours. Hemoglobin A1C: No results for input(s): HGBA1C in the last 72 hours. Fasting Lipid Panel: No results for input(s): CHOL, HDL, LDLCALC, TRIG, CHOLHDL, LDLDIRECT in the last 72 hours. Thyroid Function Tests: No results for input(s): TSH, T4TOTAL, T3FREE, THYROIDAB in the last 72 hours.  Invalid input(s): FREET3 Anemia Panel:  Recent Labs  01/12/17 1208  VITAMINB12 373  FOLATE 2.9*  FERRITIN 1,062*  TIBC 155*  IRON 101  RETICCTPCT 2.5    RADIOLOGY: Ct Abdomen Pelvis W Wo Contrast  Result Date: 01/12/2017 CLINICAL DATA:  Evaluate complex cyst identified on recent ultrasound. EXAM: CT ABDOMEN AND PELVIS WITHOUT AND WITH CONTRAST TECHNIQUE: Multidetector CT imaging of the abdomen and pelvis was performed following the standard protocol before and following the bolus administration of intravenous contrast. CONTRAST:  100mL ISOVUE-300 IOPAMIDOL (ISOVUE-300) INJECTION 61% COMPARISON:  None FINDINGS: Lower chest: No acute abnormality. Hepatobiliary: Hepatic steatosis. There is a diffuse changes of  cirrhosis involving the liver. No enhancing liver abnormalities identified to suggest hepatoma. The portal vein remains patent. Unremarkable appearance of the gallbladder. Pancreas: Normal appearance of the pancreas. Spleen: Normal in size without focal abnormality. Adrenals/Urinary Tract: Bilateral kidney lesions are identified. The complex midpole lesion arising from the lateral aspect of the right kidney is identified measuring 2.3 cm and 14 HU precontrast, image 78 of series 2. There is no significant enhancement within this lesion following the IV administration of contrast material with postcontrast Hounsfield units equal to 18.39. Hyperdense precontrast lesion arising from the mid left kidney measures 1.3 cm and 70 HU precontrast, image 64 of series 2. Postcontrast Hounsfield units equal 65.88. No internal septation or enhancing nodularity  identified. No hydronephrosis identified. The urinary bladder appears within normal limits. Stomach/Bowel: The stomach is normal. The small bowel loops have a normal course and caliber. No bowel obstruction. Unremarkable appearance of the colon. Vascular/Lymphatic: Aortic atherosclerosis. No aneurysm. No upper abdominal or pelvic adenopathy. No inguinal adenopathy noted. Reproductive: Prostate is unremarkable. Other: Moderate to large volume ascites identified within the abdomen and pelvis. Musculoskeletal: No acute or significant osseous findings. IMPRESSION: 1. 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 2. Morphologic features a liver compatible with cirrhosis. 3. Ascites. 4. Aortic atherosclerosis. Electronically Signed   By: Signa Kell M.D.   On: 01/12/2017 13:22   X-ray Chest Pa And Lateral  Result Date: 01/11/2017 CLINICAL DATA:  Shortness of breath for 4 weeks EXAM: CHEST  2 VIEW COMPARISON:  01/10/2017 FINDINGS: Cardiomediastinal silhouette is stable. No infiltrate or pleural effusion. No pulmonary edema. Again noted degenerative changes thoracic spine. IMPRESSION: No active cardiopulmonary disease. Electronically Signed   By: Natasha Mead M.D.   On: 01/11/2017 11:21   Ct Angio Chest Pe W Or Wo Contrast  Result Date: 01/10/2017 CLINICAL DATA:  Bilateral leg swelling, shortness of breath. EXAM: CT ANGIOGRAPHY CHEST WITH CONTRAST TECHNIQUE: Multidetector CT imaging of the chest was performed using the standard protocol during bolus administration of intravenous contrast. Multiplanar CT image reconstructions and MIPs were obtained to evaluate the vascular anatomy. CONTRAST:  70 cc Isovue 370 IV COMPARISON:  07/08/2014 FINDINGS: Cardiovascular: Improving clot burden within the pulmonary arteries. There is eccentric chronic clot within the left lower lobe segmental pulmonary artery, best seen on image 51 of series 4. Minimal residual filling defect noted within the  right lower lobe pulmonary arterial branch on image 55. Note new areas of thromboembolism. Heart is normal size. Aorta is normal caliber. Extensive diffuse coronary artery calcifications and scattered aortic calcifications. Mediastinum/Nodes: No mediastinal, hilar, or axillary adenopathy. Lungs/Pleura: Nodular area in the right upper lobe on image 41 is stable since prior study and compatible with scarring. Scarring in the right lower lobe, stable. No new airspace opacities or effusions. Upper Abdomen: Severe diffuse low-density throughout the liver compatible with fatty infiltration. Ascites noted adjacent to the liver and spleen. Musculoskeletal: Chest wall soft tissues are unremarkable. No acute bony abnormality. Review of the MIP images confirms the above findings. IMPRESSION: Partial resolution of the previously seen chronic pulmonary emboli. Mild residual eccentric chronic emboli noted in both lower lobes. No new/ acute pulmonary emboli. Advanced coronary artery disease. Severe low-density throughout the liver, likely fatty infiltration. Upper abdominal ascites adjacent to the liver and spleen. Electronically Signed   By: Charlett Nose M.D.   On: 01/10/2017 16:47   US Abdomen Complete  Result Date: 01/10/2017 CLINICAL DATA:  Abdominal swelling for 3 weeks  EXAM: ABDOMEN ULTRASOUND COMPLETE COMPARISON:  None. FINDINGS: Gallbladder: No gallstones are noted within gallbladder. There is some layering gallbladder sludge. No thickening of gallbladder wall. No sonographic Murphy's sign. Common bile duct: Diameter: 3.5 mm in diameter within normal limits. Liver: No focal hepatic mass. There is diffuse increased echogenicity of the liver suspicious for fatty infiltration or chronic liver disease. IVC: Not well seen due to bowel gas and abdominal ascites. Pancreas: Not well seen due to abdominal ascites. Spleen: Size and appearance within normal limits. Measures 7 cm in length. Right Kidney: Length: 13.5 cm in length.  No hydronephrosis or renal calculi. There is a complex possible cystic hypoechoic exophytic lesion in midpole of the left kidney measures 2.2 x 2.2 cm further correlation with enhanced CT or MRI is recommended to exclude a complex enhancing lesion. Left Kidney: Length: 12.7 cm. No hydronephrosis or renal calculi. A cyst in midpole measures 1.8 x 1.2 cm. Abdominal aorta: No aneurysm visualized. Measures up to 2.4 cm in diameter. Other findings: Abdominal ascites is noted. IMPRESSION: 1. No gallstones are noted within gallbladder. Small layering gallbladder sludge. Normal CBD. No sonographic Murphy's sign. 2. Heterogeneous increased echogenicity of the liver which may be due to fatty infiltration or chronic liver disease. 3. No hydronephrosis. There is a complex exophytic lesion midpole of the right kidney measures 2.2 x 2.2 cm. Further evaluation with enhanced CT or MRI is recommended to exclude complex cystic neoplasm. 4. No left hydronephrosis. A cyst in midpole of the left kidney measures 1.8 x 1.2 cm. 5. No aortic aneurysm.  Abdominal ascites is noted. Electronically Signed   By: Natasha Mead M.D.   On: 01/10/2017 16:06   US Paracentesis  Result Date: 01/11/2017 INDICATION: Ascites of unknown etiology. Request is made for diagnostic and therapeutic paracentesis. EXAM: ULTRASOUND GUIDED DIAGNOSTIC AND THERAPEUTIC PARACENTESIS MEDICATIONS: 1% lidocaine COMPLICATIONS: None immediate. PROCEDURE: Informed written consent was obtained from the patient after a discussion of the risks, benefits and alternatives to treatment. A timeout was performed prior to the initiation of the procedure. Initial ultrasound scanning demonstrates a moderate amount of ascites within the right lower abdominal quadrant. The right lower abdomen was prepped and draped in the usual sterile fashion. 1% lidocaine was used for local anesthesia. Following this, a 19 gauge, 7-cm, Yueh catheter was introduced. An ultrasound image was saved for  documentation purposes. The paracentesis was performed. The catheter was removed and a dressing was applied. The patient tolerated the procedure well without immediate post procedural complication. FINDINGS: A total of approximately 2.7 L of yellow fluid was removed. Samples were sent to the laboratory as requested by the clinical team. IMPRESSION: Successful ultrasound-guided paracentesis yielding 2.7 liters of peritoneal fluid. Read by: Barnetta Chapel, PA-C Electronically Signed   By: Judie Petit.  Shick M.D.   On: 01/11/2017 11:38   Dg Chest Port 1 View  Result Date: 01/10/2017 CLINICAL DATA:  Shortness of Breath EXAM: PORTABLE CHEST 1 VIEW COMPARISON:  04/20/2016 FINDINGS: Heart and mediastinal contours are within normal limits. No focal opacities or effusions. No acute bony abnormality. IMPRESSION: No active disease. Electronically Signed   By: Charlett Nose M.D.   On: 01/10/2017 10:53    PHYSICAL EXAM General: NAD Neck: No JVD, no thyromegaly or thyroid nodule.  Lungs: Clear to auscultation bilaterally with normal respiratory effort. CV: Nondisplaced PMI.  Heart mildly tachy, regular S1/S2, no S3/S4, no murmur.  1+ ankle edema.   Abdomen: Soft, nontender, no hepatosplenomegaly, mild distention but soft.  Neurologic: Alert  and oriented x 3.  Psych: Normal affect. Extremities: No clubbing or cyanosis.   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  Found to have ascites and evidence for RV dysfunction by echo as well as at least moderate pulmonary hypertension.   Cath with significant ASD and left to right shunt. Dr Shirlee Latch to arrange f/u with Dr Excell Seltzer to discuss percutaneous closure Would not be considered etiology of his cirrhosis continue aldactone further GI w/u Need  to r/o hemochromatosis given Elevated ferritin   ASD closure will be elective    Charlton Haws 01/14/2017 10:23 AM

## 2017-01-14 NOTE — Progress Notes (Signed)
Subjective:  Patient was doing well this morning. Patient states that he was able to get up and go to the bathroom with out dizziness or SOB. Explained results of cardiac cath and TEE to patient this morning. Patient understood.   Objective:  Vital signs in last 24 hours: Vitals:   01/13/17 1900 01/13/17 2124 01/14/17 0407 01/14/17 0850  BP: (!) 99/59 90/60 (!) 88/54 97/78  Pulse: (!) 117 (!) 102 (!) 113 (!) 106  Resp:  18 18   Temp:  98.8 F (37.1 C) 99.1 F (37.3 C) 98.2 F (36.8 C)  TempSrc:  Oral Oral Oral  SpO2:  97% 94% 98%  Weight:   70.8 kg (156 lb 1.6 oz)   Height:       Physical Exam  Constitutional: He appears well-developed and well-nourished.  Cardiovascular: Regular rhythm and normal heart sounds.   tachycardic  Abdominal: Soft. Bowel sounds are normal. He exhibits distension. There is no tenderness.  Musculoskeletal:  2+ pitting edema. L > R leg & pedal edema  Skin: Skin is warm and dry.   TEE Findings: Normal LV size with EF 55-60%.  Normal wall thickness.  No regional WMAs.  The right ventricle was mildly dilated with mildly decreased systolic function.  The right atrium was mildly dilated.  The left atrium was mildly dilated.  There was no LA appendage thrombus.  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  Moderate TR, peak RV-RA gradient 73 mmHg.  Mild mitral regurgitation.  Trileaflet aortic valve with no stenosis or regurgitation.  Normal caliber aorta with grade III-IV plaque in descending thoracic aorta.   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   -- Laurey Morale, MD  RHC/LHC 1. There is a significant left to right shunt at the atrial level, consistent with ASD.  2. Right and left heart filling pressures are not elevated.  3. Mild pulmonary hypertension, PVR is not significantly elevated.  4. Subtotal occlusion of the PDA with  left to right collaterals.   I think that he needs the ASD closed.  Dr. Excell Seltzer, who performs this procedure, is not here this week.  If he remains in the hospital on Monday, I will ask Dr Excell Seltzer to see him.  If not, he can followup as an outpatient.   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    Surprisingly, pulmonary hypertension is only mild.   -- Laurey Morale, MD  Assessment/Plan:  Active Problems:   Chronic pulmonary embolism (HCC)   Ascites   Pulmonary embolus (HCC)   Other ascites   Shortness of breath   SOB (shortness of breath)   Pulmonary hypertension, primary (HCC)   RVF (right ventricular failure) (HCC)   Ascites due to alcoholic cirrhosis (HCC)   Atrial septal defect, secundum   71 y.o.malewith h/o paroxysmal AFib,[name],who presented with worsening shortness of breath lightheadedness and dizziness with ambulation,along with bilateral leg swelling and abdominal swellingfor past3 weeks.  Pulmonary HTN w/ hx of chronic PE and ASD Patient had RHC/LHC & TEE yesterday. TEE showed Secundum ASD with left to right shunt with mildly dilated and dysfunctional RV. RHC/LHC showed there is a significant left to right shunt secondary to ASD, subtotal occlusion of the PDA , and only mild pulmonary HTN. Does not seem like chronic PE & ASD is the etiology of ascites , though  they may still contribute. Cardiology would like to meet with patient on Monday to fix patient's large ASD. Patient will resume his anticoagulation regimen.  -- Cardiology f/u Monday -- restart anticoag regimen for chronic PE PPX  Liver Cirrhosis  Liver imaging is suggestive of liver cirrhosis. Patient has multiple potential contributing factors. Both U/S and CT showed features of fatty liver. Patient also has a history of alcohol abuse, although patient says he stopped drinking 16 years ago. While working up anemia, patient was found to have very high % saturation and  ferritin suggestive of hemochromatosis. Patient will likely need liver biopsy to confirm etiology of cirrhosis. Patient negative for Hep B. Patient ultimately needs liver biopsy to confirm etiology of his liver cirrhosis as this is the most likely source of his symptoms currently. Will discuss whether IP or OP. -- cont spironolactone 12.5 mg QD -- IP vs OP liver biopsy  Anemia Patient came in with hgb of 9.9 with MCV in the 120s. We worked up anemia and found along with abnormal iron studies detailed above low folate and lower normal B12. Will check MMA to see if patient is B12 deficient.  -- started Folic acid supplements -- MMA pending   Complex Cyst of Kidney CT abdomen w/ & w/o contrast reveal that Kidney cyst are Bosniak category 1 & 2 -- f/u outpatient  Subclinical Hypothyroidism T4 is normal. No need to treat at this time.   Fluids: None Diet:Regular Diet DVT Prophylaxis: Coumadin Code Status: FULL Dispo: Anticipated discharge in approximately 2-3 day(s).   Jeremy BeeJohn A Dmarion Herman, Medical Student 01/14/2017, 10:50 AM Pager: [phone]

## 2017-01-14 NOTE — Progress Notes (Signed)
ANTICOAGULATION CONSULT NOTE - FOLLOW UP  Pharmacy Consult: Heparin Indication: History of DVT/PE  Allergies  Allergen Reactions  . Fish Allergy Anaphylaxis and Other (See Comments)    Diarrhea     Patient Measurements: Height: 6' (182.9 cm) Weight: 156 lb 1.6 oz (70.8 kg) IBW/kg (Calculated) : 77.6  Vital Signs: Temp: 98.4 F (36.9 C) (02/17 1329) Temp Source: Oral (02/17 1329) BP: 96/60 (02/17 1329) Pulse Rate: 108 (02/17 1329)  Labs:  Recent Labs  01/12/17 0238 01/13/17 0231 01/13/17 1006 01/14/17 0426 01/14/17 0720 01/14/17 1457  HGB 9.9* 9.9*  --  10.2*  --   --   HCT 28.2* 27.9*  --  28.6*  --   --   PLT 142* 139*  --  157  --   --   LABPROT 25.8* 23.4* 20.7* 19.2*  --   --   INR 2.31 2.05 1.75 1.60  --   --   HEPARINUNFRC  --   --   --   --  0.27* 0.60  CREATININE 0.56* 0.56*  --  0.59*  --   --     Estimated Creatinine Clearance: 87.3 mL/min (by C-G formula based on SCr of 0.59 mg/dL (L)).  Assessment: 70 YOM on Coumadin PTA for history of PE/DVT, presented with SOB and swelling of lower extremities. CTA showed partial resolution of chronic PE, negative for new PE.   Patient taken to cath yestserday. Possible ASD which would require intervention to fix although the cardiologist that performs this is not back until next week. Heparin restarted 2/16 PM  Heparin level therapeutic this PM  Home Coumadin regimen: 2mg  daily except 1mg  on TTS  Goal of Therapy:  INR 2-3   Plan:  Continue heparin at 1650 units / hr Warfarin 2 mg po x 1 Daily labs  Thank you Okey RegalLisa Nickayla Mcinnis, PharmD [phone] 01/14/2017 3:47 PM

## 2017-01-14 NOTE — Progress Notes (Signed)
ANTICOAGULATION CONSULT NOTE - FOLLOW UP  Pharmacy Consult: Heparin Indication: History of DVT/PE  Allergies  Allergen Reactions  . Fish Allergy Anaphylaxis and Other (See Comments)    Diarrhea     Patient Measurements: Height: 6' (182.9 cm) Weight: 156 lb 1.6 oz (70.8 kg) IBW/kg (Calculated) : 77.6  Vital Signs: Temp: 99.1 F (37.3 C) (02/17 0407) Temp Source: Oral (02/17 0407) BP: 88/54 (02/17 0407) Pulse Rate: 113 (02/17 0407)  Labs:  Recent Labs  01/12/17 0238 01/13/17 0231 01/13/17 1006 01/14/17 0426 01/14/17 0720  HGB 9.9* 9.9*  --  10.2*  --   HCT 28.2* 27.9*  --  28.6*  --   PLT 142* 139*  --  157  --   LABPROT 25.8* 23.4* 20.7* 19.2*  --   INR 2.31 2.05 1.75 1.60  --   HEPARINUNFRC  --   --   --   --  0.27*  CREATININE 0.56* 0.56*  --  0.59*  --     Estimated Creatinine Clearance: 87.3 mL/min (by C-G formula based on SCr of 0.59 mg/dL (L)).  Assessment: 70 YOM on Coumadin PTA for history of PE/DVT, presented with SOB and swelling of lower extremities. CTA showed partial resolution of chronic PE, negative for new PE.   Patient taken to cath yestserday. Possible ASD which would require intervention to fix although the cardiologist that performs this is not back until next week. Heparin restarted 2/16 PM -HL subtherapeutic 0.27, CBC stable, no signs of bleeding  Home Coumadin regimen: 2mg  daily except 1mg  on TTS  Goal of Therapy:  INR 2-3   Plan:  -Heparin bolus 1000 units, then increase heparin to 1650 units/hr -6 hr HL -Daily HL/CBC, monitor signs of bleeding  Gwyndolyn KaufmanKai Shaylan Tutton Bernette Redbird(Kenny), PharmD  PGY1 Pharmacy Resident Pager: [phone] 01/14/2017 9:02 AM

## 2017-01-14 NOTE — Progress Notes (Signed)
Subjective: Patient was feeling better when seen this morning. He states that he never felt any dizziness when walking to bathroom today.  Objective:  Vital signs in last 24 hours: Vitals:   01/13/17 2124 01/14/17 0407 01/14/17 0850 01/14/17 1329  BP: 90/60 (!) 88/54 97/78 96/60   Pulse: (!) 102 (!) 113 (!) 106 (!) 108  Resp: 18 18  18   Temp: 98.8 F (37.1 C) 99.1 F (37.3 C) 98.2 F (36.8 C) 98.4 F (36.9 C)  TempSrc: Oral Oral Oral Oral  SpO2: 97% 94% 98% 99%  Weight:  156 lb 1.6 oz (70.8 kg)    Height:       Gen. little emaciatedman, in no acute distress. Chest.Clear bilaterally. CVS.Regular rate and rhythm,hardto assess heart sounds. No JVD. Abdomen.Soft, distended, nontender, bowel sounds positive. Extremities.Bilateral 2+ pitting edema up to mid calves.  Labs. CBC Latest Ref Rng & Units 01/14/2017 01/13/2017 01/12/2017  WBC 4.0 - 10.5 K/uL 12.8(H) 10.7(H) 9.5  Hemoglobin 13.0 - 17.0 g/dL 10.2(L) 9.9(L) 9.9(L)  Hematocrit 39.0 - 52.0 % 28.6(L) 27.9(L) 28.2(L)  Platelets 150 - 400 K/uL 157 139(L) 142(L)   BMP Latest Ref Rng & Units 01/14/2017 01/13/2017 01/12/2017  Glucose 65 - 99 mg/dL 161(W117(H) 960(A123(H) 540(J132(H)  BUN 6 - 20 mg/dL 7 8 11   Creatinine 0.61 - 1.24 mg/dL 8.11(B0.59(L) 1.47(W0.56(L) 2.95(A0.56(L)  Sodium 135 - 145 mmol/L 134(L) 132(L) 134(L)  Potassium 3.5 - 5.1 mmol/L 3.6 3.3(L) 3.5  Chloride 101 - 111 mmol/L 101 100(L) 100(L)  CO2 22 - 32 mmol/L 23 25 26   Calcium 8.9 - 10.3 mg/dL 7.8(L) 7.9(L) 7.9(L)   TEE. Findings: Normal LV size with EF 55-60%.  Normal wall thickness.  No regional WMAs.  The right ventricle was mildly dilated with mildly decreased systolic function.  The right atrium was mildly dilated.  The left atrium was mildly dilated.  There was no LA appendage thrombus.  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  Moderate TR, peak RV-RA gradient 73 mmHg.  Mild mitral regurgitation.  Trileaflet aortic valve with no stenosis  or regurgitation.  Normal caliber aorta with grade III-IV plaque in descending thoracic aorta.   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   Right and left cardiac cath.. Conclusion   1. There is a significant left to right shunt at the atrial level, consistent with ASD.  2. Right and left heart filling pressures are not elevated.  3. Mild pulmonary hypertension, PVR is not significantly elevated.  4. Subtotal occlusion of the PDA with left to right collaterals.   I think that he needs the ASD closed.  Dr. Excell Seltzerooper, who performs this procedure, is not here this week.  If he remains in the hospital on Monday, I will ask Dr Excell Seltzerooper to see him.  If not, he can followup as an outpatient.   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    Surprisingly, pulmonary hypertension is only mild.      Assessment/Plan:  Jeremy BaneJames Shamlinis a 71 y.o.male.With past medical history significant for paroxysmal atrial fibrillation and [name], is on chronic anticoagulation came to ED with complaint of worsening shortness of breath lightheadedness and dizziness with ambulation,along with bilateral leg swelling and abdominal swellingfor past3 weeks.  Liver cirrhosis. Most likely the cause of his ascites. In order to discriminate between alcoholic versus hemochromatosis-he needs a liver biopsy. Can  be done as an outpatient. -Continue folic acid supplements. -Continue spironolactone. -MMA still pending.  Pulmonary hypertension with history of chronic PE and ASD. His TEE shows ASD of 15 mm, with left-to-right shunt. He has mild pulmonary hypertension according to right heart catheterization, his pulmonary hypertension is less likely the cause of his cirrhosis. -He needs a percutaneous closure as an elective procedure.  Hypotension with positive orthostasis. Most likely because of intravascular volume  depletion. -Might need some IV fluids, that can also increase his ascites and peripheral edema because of his low albumin. -Keep monitoring.  Subclinical hypothyroidism.No need for any intervention at this time.  H/O PAF. Currently sinus rhythm. -Continue anticoagulation  . 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 Most likely benign. -Can be followed up as an outpatient.  Dispo: Anticipated discharge in approximately 1-2 day(s).   [name], MD 01/14/2017, 4:25 PM Pager: [phone]

## 2017-01-15 ENCOUNTER — Encounter (HOSPITAL_COMMUNITY): Payer: Self-pay | Admitting: Cardiology

## 2017-01-15 DIAGNOSIS — D539 Nutritional anemia, unspecified: Secondary | ICD-10-CM

## 2017-01-15 LAB — CBC
HCT: 27.7 % — ABNORMAL LOW (ref 39.0–52.0)
Hemoglobin: 9.9 g/dL — ABNORMAL LOW (ref 13.0–17.0)
MCH: 44.4 pg — AB (ref 26.0–34.0)
MCHC: 35.7 g/dL (ref 30.0–36.0)
MCV: 124.2 fL — AB (ref 78.0–100.0)
PLATELETS: 149 10*3/uL — AB (ref 150–400)
RBC: 2.23 MIL/uL — ABNORMAL LOW (ref 4.22–5.81)
RDW: 15.7 % — AB (ref 11.5–15.5)
WBC: 14.1 10*3/uL — ABNORMAL HIGH (ref 4.0–10.5)

## 2017-01-15 LAB — BASIC METABOLIC PANEL
Anion gap: 7 (ref 5–15)
BUN: 6 mg/dL (ref 6–20)
CALCIUM: 7.9 mg/dL — AB (ref 8.9–10.3)
CHLORIDE: 102 mmol/L (ref 101–111)
CO2: 23 mmol/L (ref 22–32)
CREATININE: 0.64 mg/dL (ref 0.61–1.24)
GFR calc Af Amer: >60 mL/min (ref >60)
GFR calc non Af Amer: >60 mL/min (ref >60)
Glucose, Bld: 115 mg/dL — ABNORMAL HIGH (ref 65–99)
Potassium: 3.6 mmol/L (ref 3.5–5.1)
Sodium: 132 mmol/L — ABNORMAL LOW (ref 135–145)

## 2017-01-15 LAB — HEPARIN LEVEL (UNFRACTIONATED): HEPARIN UNFRACTIONATED: 0.69 [IU]/mL (ref 0.30–0.70)

## 2017-01-15 LAB — PROTIME-INR
INR: 1.73
PROTHROMBIN TIME: 20.4 s — AB (ref 11.4–15.2)

## 2017-01-15 MED ORDER — DILTIAZEM HCL ER COATED BEADS 120 MG PO CP24
120.0000 mg | ORAL_CAPSULE | Freq: Every day | ORAL | Status: DC
  Administered 2017-01-15 – 2017-01-16 (×2): 120 mg via ORAL
  Filled 2017-01-15 (×2): qty 1

## 2017-01-15 MED ORDER — WARFARIN SODIUM 1 MG PO TABS
1.0000 mg | ORAL_TABLET | Freq: Once | ORAL | Status: AC
  Administered 2017-01-15: 1 mg via ORAL
  Filled 2017-01-15: qty 1

## 2017-01-15 MED ORDER — SPIRONOLACTONE 25 MG PO TABS
100.0000 mg | ORAL_TABLET | Freq: Every day | ORAL | Status: DC
  Administered 2017-01-15 – 2017-01-16 (×2): 100 mg via ORAL
  Filled 2017-01-15 (×2): qty 4

## 2017-01-15 NOTE — Progress Notes (Signed)
Internal Medicine Attending  Date: 01/15/2017  Patient name: Magda PaganiniJames Couchman Medical record number: [id number] Date of birth: September 14, 1946 Age: 71 y.o. Gender: male  I saw and evaluated the patient. I reviewed the resident's note by Dr. Nelson ChimesAmin and I agree with the resident's findings and plans as documented in her progress note.  When seen on rounds this morning Mr. Miotke was without acute complaints. Examination revealed a distended abdomen consistent with reaccumulating ascitic fluid. He will see Dr. Excell Seltzerooper tomorrow for assessment on his candidacy for a percutaneous closure of his ASD. Given the results of his right heart catheterization we believe the ascites is now more related to his liver disease. We will increase his spironolactone to 100 mg by mouth daily. We will decrease the diltiazem to 120 mg daily. Hopefully these changes should allow us to remove more ascitic fluid and give us room to move to increase the spironolactone as tolerated. The primary team can consider doing a little more research on the history of the atrial arrhythmias the patient states he has. A brief review of the chart suggests he does not have atrial fibrillation per the history tab although there are conflicting reports in the notes and he states he has atrial fibrillation. This would be important to sort out with regards to the need for nodal agents. 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 Regardless, his decreased systemic vascular resistance has led to decreased blood pressure which may become problematic as we try to move forward and remove his ascitic fluid via diuretics. There is some data to suggest that a mean arterial pressure that is low may respond to Midodrin and increase survival in this patient population. This may be something  to consider moving forward, especially if further escalation in his diuretics are required. Concerning the possible hemochromatosis and iron overload I agree with checking a C2Y82 mutation because this may obviate the need for liver biopsy were he to meet clinical criteria for the diagnosis, especially if he is homozygous for this mutation. After tomorrow's evaluation for ASD closure candidacy I suspect he will be stable for discharge home as the rest of this workup and adjustments in his regimen can be done in the outpatient setting.

## 2017-01-15 NOTE — Progress Notes (Signed)
   Subjective: Pt. Was feeling better. States that his dizziness improved, but he never walked little long distance to really check on that. He was comfortable with trips to bathroom and around the room.  Objective:  Vital signs in last 24 hours: Vitals:   01/14/17 1329 01/14/17 1656 01/14/17 1926 01/15/17 0404  BP: 96/60 93/62 96/60  115/70  Pulse: (!) 108 96 (!) 110 (!) 110  Resp: 18  18 18   Temp: 98.4 F (36.9 C) 98.8 F (37.1 C) 98.6 F (37 C) 97.3 F (36.3 C)  TempSrc: Oral Oral Oral Oral  SpO2: 99% 98% 98% 95%  Weight:    157 lb 4.8 oz (71.4 kg)  Height:       Gen. Chronically Ill looking, little emaciatedman, in no acute distress. Chest.Clear bilaterally. CVS.Regular rate and rhythm,hardto assess heart sounds. No JVD. Abdomen.Soft, distended,nontender, bowel sounds positive.Ascities seems worsening. Extremities.Bilateral 2+ pitting edema up to low calves.Leg edema improved as compare to before, feet remained pretty edematous.  Labs. CBC Latest Ref Rng & Units 01/15/2017 01/14/2017 01/13/2017  WBC 4.0 - 10.5 K/uL 14.1(H) 12.8(H) 10.7(H)  Hemoglobin 13.0 - 17.0 g/dL 8.1(X9.9(L) 10.2(L) 9.9(L)  Hematocrit 39.0 - 52.0 % 27.7(L) 28.6(L) 27.9(L)  Platelets 150 - 400 K/uL 149(L) 157 139(L)   BMP Latest Ref Rng & Units 01/15/2017 01/14/2017 01/13/2017  Glucose 65 - 99 mg/dL 914(N115(H) 829(F117(H) 621(H123(H)  BUN 6 - 20 mg/dL 6 7 8   Creatinine 0.61 - 1.24 mg/dL 0.860.64 5.78(I0.59(L) 6.96(E0.56(L)  Sodium 135 - 145 mmol/L 132(L) 134(L) 132(L)  Potassium 3.5 - 5.1 mmol/L 3.6 3.6 3.3(L)  Chloride 101 - 111 mmol/L 102 101 100(L)  CO2 22 - 32 mmol/L 23 23 25   Calcium 8.9 - 10.3 mg/dL 7.9(L) 7.8(L) 7.9(L)    Assessment/Plan:  Jeremy BaneJames Shamlinis a 70 y.o.male.With past medical history significant for paroxysmal atrial fibrillation and [name], is on chronic anticoagulation came to ED with complaint of worsening shortness of breath lightheadedness and dizziness with ambulation,along with bilateral  leg swelling and abdominal swellingfor past3 weeks.  Liver cirrhosis.Ascities seems worsening. He needs higher doses of spironolactone. His BP remained on softer side most of the time. -We will try decreasing Cardizem from 180 to 120, and then increase spironolactone to 100 mg daily. -Will get benefit with adding midodrin to maintain BP, if needed. -Check C2Y82 mutation for hemochromatosis.  Pulmonary hypertension with history of chronic PE and ASD. -Most likely get evaluation by Dr. Excell Seltzerooper tomorrow, for ASD elective repair.  Macrocytic Anemia. -Continue folic acid. -MMA is still pending.  H/O PAF. Currently sinus rhythm. -Continue anticoagulation  . Complex cyst on the right kidney.Most likely benign. -Can be followed up as an outpatient.   Dispo: Anticipated discharge in approximately 1 day(s).   Arnetha CourserSumayya Kya Mayfield, MD 01/15/2017, 9:58 AM Pager: [phone]

## 2017-01-15 NOTE — Progress Notes (Signed)
ANTICOAGULATION CONSULT NOTE - FOLLOW UP  Pharmacy Consult: Coumadin Indication: History of DVT/PE  Allergies  Allergen Reactions  . Fish Allergy Anaphylaxis and Other (See Comments)    Diarrhea     Patient Measurements: Height: 6' (182.9 cm) Weight: 157 lb 4.8 oz (71.4 kg) IBW/kg (Calculated) : 77.6  Vital Signs: Temp: 97.3 F (36.3 C) (02/18 0404) Temp Source: Oral (02/18 0404) BP: 101/59 (02/18 1025) Pulse Rate: 110 (02/18 0404)  Labs:  Recent Labs  01/13/17 0231 01/13/17 1006 01/14/17 0426 01/14/17 0720 01/14/17 1457 01/15/17 0257 01/15/17 0710  HGB 9.9*  --  10.2*  --   --  9.9*  --   HCT 27.9*  --  28.6*  --   --  27.7*  --   PLT 139*  --  157  --   --  149*  --   LABPROT 23.4* 20.7* 19.2*  --   --  20.4*  --   INR 2.05 1.75 1.60  --   --  1.73  --   HEPARINUNFRC  --   --   --  0.27* 0.60  --  0.69  CREATININE 0.56*  --  0.59*  --   --  0.64  --     Estimated Creatinine Clearance: 88 mL/min (by C-G formula based on SCr of 0.64 mg/dL).  Assessment: 70yom on coumadin pta for hx DVT/PE.  INR 3.22 on admit, coumadin initially resumed then held starting 2/15 pending R/LHC. Pharmacy asked to start heparin when INR < 2, INR down to 1.75 on 2/16 and patient had cath. Heparin and coumadin resumed post-cath. INR trending up to 1.73 today. Heparin discontinued. CBC stable. No bleeding.  Home Coumadin regimen: 2mg  daily except 1mg  on TTS  Goal of Therapy:  INR 2-3  Monitor platelets by anticoagulation protocol: yes   Plan:  1) Coumadin 1mg  x 1 2) Daily INR  Louie CasaJennifer Isidora Laham, PharmD, BCPS 01/15/2017 12:21 PM

## 2017-01-16 DIAGNOSIS — E440 Moderate protein-calorie malnutrition: Secondary | ICD-10-CM | POA: Insufficient documentation

## 2017-01-16 LAB — BASIC METABOLIC PANEL
ANION GAP: 9 (ref 5–15)
BUN: 10 mg/dL (ref 6–20)
CO2: 23 mmol/L (ref 22–32)
Calcium: 8 mg/dL — ABNORMAL LOW (ref 8.9–10.3)
Chloride: 104 mmol/L (ref 101–111)
Creatinine, Ser: 0.63 mg/dL (ref 0.61–1.24)
GFR calc Af Amer: >60 mL/min (ref >60)
GFR calc non Af Amer: >60 mL/min (ref >60)
GLUCOSE: 117 mg/dL — AB (ref 65–99)
POTASSIUM: 3.7 mmol/L (ref 3.5–5.1)
Sodium: 136 mmol/L (ref 135–145)

## 2017-01-16 LAB — PROTIME-INR
INR: 1.33
INR: 1.27
Prothrombin Time: 16.6 seconds — ABNORMAL HIGH (ref 11.4–15.2)
Prothrombin Time: 15.9 seconds — ABNORMAL HIGH (ref 11.4–15.2)

## 2017-01-16 LAB — CBC
HEMATOCRIT: 28.2 % — AB (ref 39.0–52.0)
HEMOGLOBIN: 9.9 g/dL — AB (ref 13.0–17.0)
MCH: 42.9 pg — AB (ref 26.0–34.0)
MCHC: 35.1 g/dL (ref 30.0–36.0)
MCV: 122.1 fL — ABNORMAL HIGH (ref 78.0–100.0)
Platelets: 179 10*3/uL (ref 150–400)
RBC: 2.31 MIL/uL — ABNORMAL LOW (ref 4.22–5.81)
RDW: 15.1 % (ref 11.5–15.5)
WBC: 12.5 10*3/uL — ABNORMAL HIGH (ref 4.0–10.5)

## 2017-01-16 LAB — CULTURE, BODY FLUID W GRAM STAIN -BOTTLE: Culture: NO GROWTH

## 2017-01-16 MED ORDER — SODIUM CHLORIDE 0.9% FLUSH
3.0000 mL | Freq: Two times a day (BID) | INTRAVENOUS | Status: DC
  Administered 2017-01-16: 3 mL via INTRAVENOUS

## 2017-01-16 MED ORDER — ENOXAPARIN SODIUM 40 MG/0.4ML ~~LOC~~ SOLN
40.0000 mg | Freq: Every day | SUBCUTANEOUS | Status: DC
  Administered 2017-01-16 – 2017-01-18 (×2): 40 mg via SUBCUTANEOUS
  Filled 2017-01-16 (×3): qty 0.4

## 2017-01-16 MED ORDER — SODIUM CHLORIDE 0.9 % IV SOLN
INTRAVENOUS | Status: DC
  Administered 2017-01-17: 05:00:00 via INTRAVENOUS

## 2017-01-16 MED ORDER — SPIRONOLACTONE 25 MG PO TABS
200.0000 mg | ORAL_TABLET | Freq: Every day | ORAL | Status: DC
  Administered 2017-01-17 – 2017-01-18 (×2): 200 mg via ORAL
  Filled 2017-01-16 (×2): qty 8

## 2017-01-16 MED ORDER — SODIUM CHLORIDE 0.9% FLUSH: 3.0000 mL | INTRAVENOUS | Status: DC | PRN

## 2017-01-16 MED ORDER — ENSURE ENLIVE PO LIQD
237.0000 mL | Freq: Two times a day (BID) | ORAL | Status: DC
  Administered 2017-01-16: 237 mL via ORAL
  Filled 2017-01-16 (×4): qty 237

## 2017-01-16 MED ORDER — WARFARIN SODIUM 4 MG PO TABS: 4.0000 mg | ORAL_TABLET | Freq: Once | ORAL | Status: DC

## 2017-01-16 MED ORDER — MIDODRINE HCL 5 MG PO TABS
10.0000 mg | ORAL_TABLET | Freq: Three times a day (TID) | ORAL | Status: DC
  Administered 2017-01-16 – 2017-01-18 (×5): 10 mg via ORAL
  Filled 2017-01-16 (×5): qty 2

## 2017-01-16 MED ORDER — ASPIRIN 81 MG PO CHEW
81.0000 mg | CHEWABLE_TABLET | ORAL | Status: AC
  Administered 2017-01-17: 81 mg via ORAL
  Filled 2017-01-16: qty 1

## 2017-01-16 MED ORDER — SODIUM CHLORIDE 0.9 % IV SOLN: 250.0000 mL | INTRAVENOUS | Status: DC | PRN

## 2017-01-16 NOTE — Progress Notes (Signed)
   Subjective: Patient was feeling better. No new complaints. Stating that he is okay walking short distance around the room, has not walked down the hall over the weekend.  Objective:  Vital signs in last 24 hours: Vitals:   01/15/17 1025 01/15/17 1301 01/15/17 2003 01/16/17 0523  BP: (!) 101/59 108/65 97/60 (!) 90/54  Pulse:  (!) 105 (!) 114 (!) 106  Resp:  18 18 18   Temp:  98.3 F (36.8 C) 98.3 F (36.8 C) 98.2 F (36.8 C)  TempSrc:  Oral Oral Oral  SpO2:  99% 96% 96%  Weight:    158 lb 9.6 oz (71.9 kg)  Height:       Gen. Little emaciated man, in no acute distress. Chest. Clear bilaterally, no rhonchi or wheeze. Abdomen. Soft, nontender, distended (less tense as compared to yesterday) bowel sounds positive. Extremities. Bilateral 1+ pitting edema up to just above his ankle.    Labs. CBC Latest Ref Rng & Units 01/16/2017 01/15/2017 01/14/2017  WBC 4.0 - 10.5 K/uL 12.5(H) 14.1(H) 12.8(H)  Hemoglobin 13.0 - 17.0 g/dL 1.6(X9.9(L) 0.9(U9.9(L) 10.2(L)  Hematocrit 39.0 - 52.0 % 28.2(L) 27.7(L) 28.6(L)  Platelets 150 - 400 K/uL 179 149(L) 157   BMP Latest Ref Rng & Units 01/16/2017 01/15/2017 01/14/2017  Glucose 65 - 99 mg/dL 045(W117(H) 098(J115(H) 191(Y117(H)  BUN 6 - 20 mg/dL 10 6 7   Creatinine 0.61 - 1.24 mg/dL 7.820.63 9.560.64 2.13(Y0.59(L)  Sodium 135 - 145 mmol/L 136 132(L) 134(L)  Potassium 3.5 - 5.1 mmol/L 3.7 3.6 3.6  Chloride 101 - 111 mmol/L 104 102 101  CO2 22 - 32 mmol/L 23 23 23   Calcium 8.9 - 10.3 mg/dL 8.0(L) 7.9(L) 7.8(L)   INR. 1.33  Assessment/Plan:  Willaim BaneJames Herman a 71 y.o.male.With past medical history significant for paroxysmal atrial fibrillation and [name], is on chronic anticoagulation came to ED with complaint of worsening shortness of breath lightheadedness and dizziness with ambulation,along with bilateral leg swelling and abdominal swellingfor past3 weeks.  Liver cirrhosis. Abdomen seems a little less tense as compared to yesterday. According to patient he is  diuresing well with increase of spironolactone to 100 milligrams yesterday.I/O . Are not completely done for his output. He will get benefit with increase in his spironolactone, will increase to 200 tomorrow. His low blood pressure is a limiting factor, we will add midodrine 10 mg 3 times a day today.we will monitor him 1 more day, he can be discharged home tomorrow with a close follow-up with PCP to titrate his spironolactone. -[id number] mutation for hemochromatosis-results are pending. He will need a GI and hematology consult as an outpatient.  Pulmonary hypertension with history of chronic PE and ASD. -Waiting for Dr. Earmon Phoenixooper's evaluation, most likely a elective procedure.  Macrocytic Anemia. -Continue folic acid. -MMA is still pending.  H/O PAF. Currently sinus rhythm. -Continue anticoagulation  -Continue diltiazem 120 mg daily. . Complex cyst on the right kidney.Most likely benign. -Can be followed up as an outpatient.  Dispo: Anticipated discharge in approximately 1 day(s).   Arnetha CourserSumayya Lennart Gladish, MD 01/16/2017, 8:48 AM Pager: [phone]

## 2017-01-16 NOTE — Progress Notes (Addendum)
ANTICOAGULATION CONSULT NOTE - FOLLOW UP  Pharmacy Consult: Coumadin Indication: History of DVT/PE  Allergies  Allergen Reactions  . Fish Allergy Anaphylaxis and Other (See Comments)    Diarrhea     Patient Measurements: Height: 6' (182.9 cm) Weight: 158 lb 9.6 oz (71.9 kg) IBW/kg (Calculated) : 77.6  Vital Signs: Temp: 98.2 F (36.8 C) (02/19 0523) Temp Source: Oral (02/19 0523) BP: 90/54 (02/19 0523) Pulse Rate: 106 (02/19 0523)  Labs:  Recent Labs  01/14/17 0426 01/14/17 0720 01/14/17 1457 01/15/17 0257 01/15/17 0710 01/16/17 0229 01/16/17 1107  HGB 10.2*  --   --  9.9*  --  9.9*  --   HCT 28.6*  --   --  27.7*  --  28.2*  --   PLT 157  --   --  149*  --  179  --   LABPROT 19.2*  --   --  20.4*  --  16.6* 15.9*  INR 1.60  --   --  1.73  --  1.33 1.27  HEPARINUNFRC  --  0.27* 0.60  --  0.69  --   --   CREATININE 0.59*  --   --  0.64  --  0.63  --     Estimated Creatinine Clearance: 88.6 mL/min (by C-G formula based on SCr of 0.63 mg/dL).  Assessment: 70yom on coumadin pta for hx DVT/PE.  INR 3.22 on admit, coumadin initially resumed then held starting 2/15 pending R/LHC. Pharmacy asked to start heparin when INR < 2, INR down to 1.75 on 2/16 and patient had cath. Heparin and coumadin resumed post-cath. INR trending up to 1.73 and  Heparin discontinued. CBC stable. No bleeding.  INR fell overnight to 1.33 recheck to ensure not an error repeat 1.27 Will boost warfarin today Home Coumadin regimen: 2mg  daily except 1mg  on TTS  Goal of Therapy:  INR 2-3  Monitor platelets by anticoagulation protocol: yes   Plan:  1) Coumadin 4mg  x 1 2) Daily INR  Leota SauersLisa Katryna Tschirhart Pharm.D. CPP, BCPS Clinical Pharmacist [phone] 01/16/2017 12:15 PM   Addendum Plan for repair ASD in am  Hold warfarin  enox VTE px   Leota SauersLisa Khyrin Trevathan Pharm.D. CPP, BCPS Clinical Pharmacist [phone] 01/16/2017 3:55 PM

## 2017-01-16 NOTE — Progress Notes (Signed)
Occupational Therapy Treatment Patient Details Name: Jeremy Herman MRN: [id number] DOB: Nov 06, 1946 Today's Date: 01/16/2017    History of present illness 71 y.o. male.With past medical history significant for  paroxysmal atrial fibrillation and [name], is on chronic anticoagulation came to ED with complaint of worsening shortness of breath lightheadedness and dizziness with ambulation, along with bilateral leg swelling and abdominal swelling for past 3 weeks. Pt with ascites and evidence for RV dysfunction by echo as well as at least moderate pulmonary hypertension, planned cardiac cath 2/16   OT comments  Pt making progress with functional goals. Pt reports that he was not dizzy during OT session. Pt will be at home alone and unable to have 24 hr assist. Acute OT to continue to follow BP sitting EOB 83/57, HR 101 BP standing 79/57, HR 118 Activity BP 88/50, HR 137   Follow Up Recommendations  No OT follow up;Supervision - Intermittent    Equipment Recommendations  Tub/shower seat;Other (comment) Lexicographer)    Recommendations for Other Services      Precautions / Restrictions Precautions Precautions: Fall Precaution Comments: check BP Restrictions Weight Bearing Restrictions: No       Mobility Bed Mobility               General bed mobility comments: Pt EOB on arrival  Transfers Overall transfer level: Needs assistance Equipment used: Rolling walker (2 wheeled) Transfers: Sit to/from Stand Sit to Stand: Supervision         General transfer comment: supervision for safety to monitor BP    Balance Overall balance assessment: Needs assistance Sitting-balance support: Feet supported;No upper extremity supported Sitting balance-Leahy Scale: Good     Standing balance support: Bilateral upper extremity supported Standing balance-Leahy Scale: Good                     ADL       Grooming: Set up;Sitting   Upper Body Bathing: Set  up;Sitting   Lower Body Bathing: Min guard;Sit to/from stand   Upper Body Dressing : Set up;Sitting   Lower Body Dressing: Min guard;Sit to/from stand   Toilet Transfer: Min guard;Ambulation;RW;Comfort height toilet;Grab bars           Functional mobility during ADLs: Min guard;Rolling walker General ADL Comments: Pt reports that he was not dizzy during acitivity, was SOB instructed on deep breathing                                      Cognition   Behavior During Therapy: WFL for tasks assessed/performed Overall Cognitive Status: Within Functional Limits for tasks assessed                                    General Comments  pt pleasant and cooperative    Pertinent Vitals/ Pain       Pain Assessment: No/denies pain                                                          Frequency    2x/wk       Progress Toward Goals  OT Goals(current goals can now  be found in the care plan section)  Progress towards OT goals: Progressing toward goals  Acute Rehab OT Goals Patient Stated Goal: get better and return home  Plan Discharge plan remains appropriate                     End of Session Equipment Utilized During Treatment: Rolling walker  OT Visit Diagnosis: Muscle weakness (generalized) (M62.81)   Activity Tolerance Patient tolerated treatment well   Patient Left with call bell/phone within reach;in bed   Nurse Communication          Time: [id number] OT Time Calculation (min): 14 min  Charges: OT General Charges $OT Visit: 1 Procedure OT Treatments $Self Care/Home Management : 8-22 mins    Galen ManilaSpencer, Maritza [place] Jeanette 01/16/2017, 2:45 PM

## 2017-01-16 NOTE — Care Management Important Message (Signed)
Important Message  Patient Details  Name: Jeremy Herman MRN: [id number] Date of Birth: 04-26-1946   Medicare Important Message Given:  Yes    Kyla BalzarineShealy, Kataleena Holsapple Abena 01/16/2017, 12:03 PM

## 2017-01-16 NOTE — Progress Notes (Signed)
Initial Nutrition Assessment  DOCUMENTATION CODES:   Non-severe (moderate) malnutrition in context of chronic illness  INTERVENTION:    Ensure Enlive po BID, each supplement provides 350 kcal and 20 grams of protein  NUTRITION DIAGNOSIS:   Malnutrition related to chronic illness as evidenced by moderate depletion of body fat, moderate depletions of muscle mass  GOAL:   Patient will meet greater than or equal to 90% of their needs  MONITOR:   PO intake, Supplement acceptance, Labs, Weight trends, I & O's  REASON FOR ASSESSMENT:   Consult Assessment of nutrition requirement/status  ASSESSMENT:   71 y.o. Male with PMH significant for paroxysmal atrial fibrillation and [name], is on chronic anticoagulation came to ED with complaint of worsening shortness of breath lightheadedness and dizziness with ambulation, along with bilateral leg swelling and abdominal swelling for past 3 weeks.  Pt reports his appetite wasn't very good PTA. Typically eats 1-2 meals per day which are usually frozen due to his limited mobility. Also reveals he's had a 8 lb weight loss (time frame unknown). Pt has never tried any nutrition supplements, however, amenable to tasting during hospital stay. Encouraged him to drink them when he's home as well.  Nutrition-Focused physical exam completed. Findings are moderate fat depletion, moderate muscle depletion, and mild edema.   Diet Order:  Diet Heart Room service appropriate? Yes; Fluid consistency: Thin  Skin:  Reviewed, no issues  Last BM:  2/18  Height:   Ht Readings from Last 1 Encounters:  01/10/17 6' (1.829 m)    Weight:   Wt Readings from Last 1 Encounters:  01/16/17 158 lb 9.6 oz (71.9 kg)   Wt Readings from Last 10 Encounters:  01/16/17 158 lb 9.6 oz (71.9 kg)  12/09/16 154 lb (69.9 kg)  08/05/16 149 lb 3.2 oz (67.7 kg)  04/20/16 151 lb 0.2 oz (68.5 kg)  02/09/16 146 lb (66.2 kg)  10/24/15 142 lb 13.7 oz (64.8 kg)   01/23/15 160 lb (72.6 kg)  09/22/14 156 lb 9.6 oz (71 kg)  07/28/14 165 lb 9.6 oz (75.1 kg)  07/09/14 165 lb 5.5 oz (75 kg)    Ideal Body Weight:  81 kg  BMI:  Body mass index is 21.51 kg/m.  Estimated Nutritional Needs:   Kcal:  1900-2100  Protein:  90-100 gm  Fluid:  1.9-2.1 L  EDUCATION NEEDS:   No education needs identified at this time  Maureen Chatters, RD, LDN Pager #: [phone] After-Hours Pager #: [phone]

## 2017-01-16 NOTE — Consult Note (Signed)
CARDIOLOGY CONSULT NOTE  Patient ID: Jeremy Herman, MRN: [id number], DOB/AGE: Apr 07, 1946 71 y.o. 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 The patient was subsequently lost to follow-up but percutaneous ASD closure was recommended at that time. The patient presents this hospitalization admission with generalized weakness, shortness of breath, and leg swelling. He was found to have significant ascites requiring paracentesis. He was evaluated by Dr. Shirlee LatchMcLean and underwent right and left heart catheterization as well as TEE. The studies are redemonstrated a large secundum ASD with QP: QS 1.8:1. The patient had incidentally noted coronary artery disease with subtotal occlusion of his right PDA branch and left to right collaterals. He has evidence of severe pulmonary hypertension estimated by echo, but invasive evaluation demonstrated only mild pulmonary hypertension. There is mild to moderate RV dilatation and dysfunction.  The patient is feeling better with current treatment including diuretic therapy. He has been restarted on warfarin and his INR is currently 1.3. 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 He denies chest pain or pressure, orthopnea, or PND. He complains of generalized weakness. He's had problems with orthostasis and has been started on midodrine. He has not had frank syncope.   Medical History:  Past Medical History:  Diagnosis Date  . Arthritis   . Chronic anticoagulation   . DVT (deep venous thrombosis) (HCC)   . Medical history non-contributory   . PE (pulmonary embolism)       Surgical History:  Past  Surgical History:  Procedure Laterality Date  . ANKLE SURGERY  1979   right ankle  . FEMUR IM NAIL Left 04/21/2016   Procedure: INTRAMEDULLARY (IM) NAIL LEFT  FEMORAL;  Surgeon: Samson FredericBrian Swinteck, MD;  Location: [facility];  Service: Orthopedics;  Laterality: Left;  . RIGHT/LEFT HEART CATH AND CORONARY ANGIOGRAPHY N/A 01/13/2017   Procedure: Right/Left Heart Cath and Coronary Angiography;  Surgeon: Laurey Moralealton S McLean, MD;  [facility];  Service: Cardiovascular;  Laterality: N/A;  . TEE WITHOUT CARDIOVERSION N/A 01/13/2017   Procedure: TRANSESOPHAGEAL ECHOCARDIOGRAM (TEE);  Surgeon: Laurey Moralealton S McLean, MD;  [facility];  Service: Cardiovascular;  Laterality: N/A;     Home Meds: Prior to Admission medications   Medication Sig Start Date End Date Taking? Authorizing Provider  diltiazem (CARDIZEM CD) 180 MG 24 hr capsule Take 1 capsule (180 mg total) by mouth daily. 12/09/16  Yes Josalyn Funches, MD  traMADol (ULTRAM) 50 MG tablet Take 1 tablet (50 mg total) by mouth every 12 (twelve) hours as needed. 12/09/16  Yes Dessa PhiJosalyn Funches, MD  warfarin (COUMADIN) 2 MG tablet Take 1 tablet by mouth every day but 2 tablets on Tuesdays and Saturdays. Patient taking differently: Take 1-2 mg by mouth See admin instructions. 1mg  On Tuesday, Thursday, Saturday - pt takes 2mg  on Sunday, Monday, Wednesday, Friday 12/09/16  Yes Dessa PhiJosalyn Funches, MD  folic acid (FOLVITE) 1 MG tablet Take 1 tablet (1 mg total) by mouth daily. Patient not taking: Reported on 01/10/2017 12/13/16   Dessa PhiJosalyn Funches, MD  Multiple Vitamins-Minerals (MULTIVITAMIN) tablet Take 1 tablet by mouth daily. Patient not taking: Reported on 01/10/2017 12/13/16   Dessa PhiJosalyn Funches, MD  thiamine (VITAMIN B-1) 100 MG tablet Take 1  tablet (100 mg total) by mouth daily. Patient not taking: Reported on 01/10/2017 12/13/16   Dessa Phi, MD    Inpatient Medications:  . atorvastatin  20 mg Oral q1800  . diltiazem  120 mg Oral Daily  . feeding  supplement (ENSURE ENLIVE)  237 mL Oral BID BM  . folic acid  1 mg Oral Daily  . midodrine  10 mg Oral TID WC  . sodium chloride flush  3 mL Intravenous Q12H  . sodium chloride flush  3 mL Intravenous Q12H  . [START ON 01/17/2017] spironolactone  200 mg Oral Daily  . warfarin  4 mg Oral ONCE-1800  . Warfarin - Pharmacist Dosing Inpatient   Does not apply q1800     Allergies:  Allergies  Allergen Reactions  . Fish Allergy Anaphylaxis and Other (See Comments)    Diarrhea     Social History   Social History  . Marital status: Married    Spouse name: N/A  . Number of children: N/A  . Years of education: N/A   Occupational History  . Not on file.   Social History Main Topics  . Smoking status: Current Every Day Smoker    Packs/day: 1.00    Years: 43.00    Types: Cigarettes  . Smokeless tobacco: Never Used     Comment: Quit 3 days ago  . Alcohol use No     Comment: Former extensive use; Quit ~16 years ago  . Drug use: No  . Sexual activity: Not Currently   Other Topics Concern  . Not on file   Social History Narrative  . No narrative on file     Family History  Problem Relation Age of Onset  . Stomach cancer Father   . Cancer Mother   . Aneurysm Mother   . Deep vein thrombosis Brother 67     Review of Systems: General: negative for chills, fever, night sweats or weight changes. Positive for generalized weakness. ENT: negative for rhinorrhea or epistaxis Cardiovascular: no chest pain Dermatological: negative for rash Respiratory: negative for cough or wheezing, positive for shortness of breath GI: negative for nausea, vomiting, diarrhea, bright red blood per rectum, melena, or hematemesis GU: no hematuria, urgency, or frequency Neurologic: negative for visual changes, syncope, headache. Positive for dizziness. Heme: no easy bruising or bleeding Endo: negative for excessive thirst, thyroid disorder, or flushing Musculoskeletal: negative for joint pain or  swelling, negative for myalgias  All other systems reviewed and are otherwise negative except as noted above.  Physical Exam: Blood pressure (!) 90/54, pulse (!) 106, temperature 98.2 F (36.8 C), temperature source Oral, resp. rate 18, height 6' (1.829 m), weight 158 lb 9.6 oz (71.9 kg), SpO2 96 %. Pt is alert and oriented, NAD HEENT: normal Neck: JVP normal. Carotid upstrokes normal without bruits. No thyromegaly. Lungs: equal expansion, clear bilaterally CV: Apex is discrete and nondisplaced, RRR without murmur or gallop Abd: soft, moderately distended Back: no CVA tenderness Ext: trace pretibial edema bilaterally        DP/PT pulses intact and = Skin: warm and dry without rash Neuro: CNII-XII intact             Strength intact = bilaterally    Labs: No results for input(s): CKTOTAL, CKMB, TROPONINI in the last 72 hours. Lab Results  Component Value Date   WBC 12.5 (H) 01/16/2017   HGB 9.9 (L) 01/16/2017   HCT 28.2 (L) 01/16/2017   MCV 122.1 (H) 01/16/2017   PLT 179  01/16/2017    Recent Labs Lab 01/13/17 0231  01/16/17 0229  NA 132*  < > 136  K 3.3*  < > 3.7  CL 100*  < > 104  CO2 25  < > 23  BUN 8  < > 10  CREATININE 0.56*  < > 0.63  CALCIUM 7.9*  < > 8.0*  PROT 5.6*  --   --   BILITOT 1.7*  --   --   ALKPHOS 86  --   --   ALT 58  --   --   AST 109*  --   --   GLUCOSE 123*  < > 117*  < > = values in this interval not displayed. Lab Results  Component Value Date   CHOL  12/24/2010    128        ATP III CLASSIFICATION:  <200     mg/dL   Desirable  409-811  mg/dL   Borderline High  >=914    mg/dL   High          HDL 25 (L) 12/24/2010   LDLCALC  12/24/2010    77        Total Cholesterol/HDL:CHD Risk Coronary Heart Disease Risk Table                     Men   Women  1/2 Average Risk   3.4   3.3  Average Risk       5.0   4.4  2 X Average Risk   9.6   7.1  3 X Average Risk  23.4   11.0        Use the calculated Patient Ratio above and the CHD Risk  Table to determine the patient's CHD Risk.        ATP III CLASSIFICATION (LDL):  <100     mg/dL   Optimal  782-956  mg/dL   Near or Above                    Optimal  130-159  mg/dL   Borderline  213-086  mg/dL   High  >578     mg/dL   Very High   TRIG 469 12/24/2010   No results found for: DDIMER  Radiology/Studies:  Ct Abdomen Pelvis W Wo Contrast  Result Date: 01/12/2017 CLINICAL DATA:  Evaluate complex cyst identified on recent ultrasound. EXAM: CT ABDOMEN AND PELVIS WITHOUT AND WITH CONTRAST TECHNIQUE: Multidetector CT imaging of the abdomen and pelvis was performed following the standard protocol before and following the bolus administration of intravenous contrast. CONTRAST:  ISOVUE-300 IOPAMIDOL (ISOVUE-300) INJECTION 61% COMPARISON:  None FINDINGS: Lower chest: No acute abnormality. Hepatobiliary: Hepatic steatosis. There is a diffuse changes of cirrhosis involving the liver. No enhancing liver abnormalities identified to suggest hepatoma. The portal vein remains patent. Unremarkable appearance of the gallbladder. Pancreas: Normal appearance of the pancreas. Spleen: Normal in size without focal abnormality. Adrenals/Urinary Tract: Bilateral kidney lesions are identified. The complex midpole lesion arising from the lateral aspect of the right kidney is identified measuring 2.3 cm and 14 HU precontrast, image 78 of series 2. There is no significant enhancement within this lesion following the IV administration of contrast material with postcontrast Hounsfield units equal to 18.39. Hyperdense precontrast lesion arising from the mid left kidney measures 1.3 cm and 70 HU precontrast, image 64 of series 2. Postcontrast Hounsfield units equal 65.88. No internal septation or enhancing nodularity identified. No hydronephrosis  identified. The urinary bladder appears within normal limits. Stomach/Bowel: The stomach is normal. The small bowel loops have a normal course and caliber. No bowel  obstruction. Unremarkable appearance of the colon. Vascular/Lymphatic: Aortic atherosclerosis. No aneurysm. No upper abdominal or pelvic adenopathy. No inguinal adenopathy noted. Reproductive: Prostate is unremarkable. Other: Moderate to large volume ascites identified within the abdomen and pelvis. Musculoskeletal: No acute or significant osseous findings. IMPRESSION: 1. 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 2. Morphologic features a liver compatible with cirrhosis. 3. Ascites. 4. Aortic atherosclerosis. Electronically Signed   By: Signa Kell M.D.   On: 01/12/2017 13:22   X-ray Chest Pa And Lateral  Result Date: 01/11/2017 CLINICAL DATA:  Shortness of breath for 4 weeks EXAM: CHEST  2 VIEW COMPARISON:  01/10/2017 FINDINGS: Cardiomediastinal silhouette is stable. No infiltrate or pleural effusion. No pulmonary edema. Again noted degenerative changes thoracic spine. IMPRESSION: No active cardiopulmonary disease. Electronically Signed   By: Natasha Mead M.D.   On: 01/11/2017 11:21   Ct Angio Chest Pe W Or Wo Contrast  Result Date: 01/10/2017 CLINICAL DATA:  Bilateral leg swelling, shortness of breath. EXAM: CT ANGIOGRAPHY CHEST WITH CONTRAST TECHNIQUE: Multidetector CT imaging of the chest was performed using the standard protocol during bolus administration of intravenous contrast. Multiplanar CT image reconstructions and MIPs were obtained to evaluate the vascular anatomy. CONTRAST:  70 cc Isovue 370 IV COMPARISON:  07/08/2014 FINDINGS: Cardiovascular: Improving clot burden within the pulmonary arteries. There is eccentric chronic clot within the left lower lobe segmental pulmonary artery, best seen on image 51 of series 4. Minimal residual filling defect noted within the right lower lobe pulmonary arterial branch on image 55. Note new areas of thromboembolism. Heart is normal size. Aorta is normal caliber. Extensive diffuse coronary artery calcifications and  scattered aortic calcifications. Mediastinum/Nodes: No mediastinal, hilar, or axillary adenopathy. Lungs/Pleura: Nodular area in the right upper lobe on image 41 is stable since prior study and compatible with scarring. Scarring in the right lower lobe, stable. No new airspace opacities or effusions. Upper Abdomen: Severe diffuse low-density throughout the liver compatible with fatty infiltration. Ascites noted adjacent to the liver and spleen. Musculoskeletal: Chest wall soft tissues are unremarkable. No acute bony abnormality. Review of the MIP images confirms the above findings. IMPRESSION: Partial resolution of the previously seen chronic pulmonary emboli. Mild residual eccentric chronic emboli noted in both lower lobes. No new/ acute pulmonary emboli. Advanced coronary artery disease. Severe low-density throughout the liver, likely fatty infiltration. Upper abdominal ascites adjacent to the liver and spleen. Electronically Signed   By: Charlett Nose M.D.   On: 01/10/2017 16:47   US Abdomen Complete  Result Date: 01/10/2017 CLINICAL DATA:  Abdominal swelling for 3 weeks EXAM: ABDOMEN ULTRASOUND COMPLETE COMPARISON:  None. FINDINGS: Gallbladder: No gallstones are noted within gallbladder. There is some layering gallbladder sludge. No thickening of gallbladder wall. No sonographic Murphy's sign. Common bile duct: Diameter: 3.5 mm in diameter within normal limits. Liver: No focal hepatic mass. There is diffuse increased echogenicity of the liver suspicious for fatty infiltration or chronic liver disease. IVC: Not well seen due to bowel gas and abdominal ascites. Pancreas: Not well seen due to abdominal ascites. Spleen: Size and appearance within normal limits. Measures 7 cm in length. Right Kidney: Length: 13.5 cm in length. No hydronephrosis or renal calculi. There is a complex possible cystic hypoechoic exophytic lesion in midpole of the left kidney measures 2.2 x 2.2 cm further  correlation with enhanced CT or  MRI is recommended to exclude a complex enhancing lesion. Left Kidney: Length: 12.7 cm. No hydronephrosis or renal calculi. A cyst in midpole measures 1.8 x 1.2 cm. Abdominal aorta: No aneurysm visualized. Measures up to 2.4 cm in diameter. Other findings: Abdominal ascites is noted. IMPRESSION: 1. No gallstones are noted within gallbladder. Small layering gallbladder sludge. Normal CBD. No sonographic Murphy's sign. 2. Heterogeneous increased echogenicity of the liver which may be due to fatty infiltration or chronic liver disease. 3. No hydronephrosis. There is a complex exophytic lesion midpole of the right kidney measures 2.2 x 2.2 cm. Further evaluation with enhanced CT or MRI is recommended to exclude complex cystic neoplasm. 4. No left hydronephrosis. A cyst in midpole of the left kidney measures 1.8 x 1.2 cm. 5. No aortic aneurysm.  Abdominal ascites is noted. Electronically Signed   By: Natasha Mead M.D.   On: 01/10/2017 16:06   US Paracentesis  Result Date: 01/11/2017 INDICATION: Ascites of unknown etiology. Request is made for diagnostic and therapeutic paracentesis. EXAM: ULTRASOUND GUIDED DIAGNOSTIC AND THERAPEUTIC PARACENTESIS MEDICATIONS: 1% lidocaine COMPLICATIONS: None immediate. PROCEDURE: Informed written consent was obtained from the patient after a discussion of the risks, benefits and alternatives to treatment. A timeout was performed prior to the initiation of the procedure. Initial ultrasound scanning demonstrates a moderate amount of ascites within the right lower abdominal quadrant. The right lower abdomen was prepped and draped in the usual sterile fashion. 1% lidocaine was used for local anesthesia. Following this, a 19 gauge, 7-cm, Yueh catheter was introduced. An ultrasound image was saved for documentation purposes. The paracentesis was performed. The catheter was removed and a dressing was applied. The patient tolerated the procedure well without immediate post procedural  complication. FINDINGS: A total of approximately 2.7 L of yellow fluid was removed. Samples were sent to the laboratory as requested by the clinical team. IMPRESSION: Successful ultrasound-guided paracentesis yielding 2.7 liters of peritoneal fluid. Read by: Barnetta Chapel, PA-C Electronically Signed   By: Judie Petit.  Shick M.D.   On: 01/11/2017 11:38   Dg Chest Port 1 View  Result Date: 01/10/2017 CLINICAL DATA:  Shortness of Breath EXAM: PORTABLE CHEST 1 VIEW COMPARISON:  04/20/2016 FINDINGS: Heart and mediastinal contours are within normal limits. No focal opacities or effusions. No acute bony abnormality. IMPRESSION: No active disease. Electronically Signed   By: Charlett Nose M.D.   On: 01/10/2017 10:53   Cardiac Studies: 2D Echo: Left ventricle:  The cavity size was normal. Systolic function was normal. The estimated ejection fraction was in the range of 60% to 65%. Wall motion was normal; there were no regional wall motion abnormalities. The transmitral flow pattern was normal. The deceleration time of the early transmitral flow velocity was normal. The pulmonary vein flow pattern was normal. The tissue Doppler parameters were normal. Left ventricular diastolic function parameters were normal.  ------------------------------------------------------------------- Aortic valve:   Trileaflet; normal thickness leaflets. Mobility was not restricted.  Doppler:  Transvalvular velocity was within the normal range. There was no stenosis. There was no regurgitation.   ------------------------------------------------------------------- Aorta:  Aortic root: The aortic root was normal in size.  ------------------------------------------------------------------- Mitral valve:   Structurally normal valve.   Mobility was not restricted.  Doppler:  Transvalvular velocity was within the normal range. There was no evidence for stenosis. There was  trivial regurgitation.  ------------------------------------------------------------------- Left atrium:  The atrium was normal in size.  ------------------------------------------------------------------- Right ventricle:  The cavity size was moderately dilated.  Wall thickness was normal. Systolic function was normal.  ------------------------------------------------------------------- Pulmonic valve:    Structurally normal valve.   Cusp separation was normal.  Doppler:  Transvalvular velocity was within the normal range. There was no evidence for stenosis. There was no regurgitation.  ------------------------------------------------------------------- Tricuspid valve:   Structurally normal valve.    Doppler: Transvalvular velocity was within the normal range. There was mild regurgitation.  ------------------------------------------------------------------- Pulmonary artery:   The main pulmonary artery was normal-sized. Systolic pressure was moderately to severely increased.  ------------------------------------------------------------------- Right atrium:  The atrium was moderately dilated.  ------------------------------------------------------------------- Pericardium:  There was no pericardial effusion.  ------------------------------------------------------------------- Systemic veins: Inferior vena cava: The vessel was normal in size. The respirophasic diameter changes were in the normal range (>= 50%), consistent with normal central venous pressure.  Cardiac Cath 01-13-2017: Procedures   Right/Left Heart Cath and Coronary Angiography  Conclusion   1. There is a significant left to right shunt at the atrial level, consistent with ASD.  2. Right and left heart filling pressures are not elevated.  3. Mild pulmonary hypertension, PVR is not significantly elevated.  4. Subtotal occlusion of the PDA with left to right collaterals.   I think that he needs the  ASD closed.  Dr. Excell Seltzer, who performs this procedure, is not here this week.  If he remains in the hospital on Monday, I will ask Dr Excell Seltzer to see him.  If not, he can followup as an outpatient.   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    Surprisingly, pulmonary hypertension is only mild.     TEE: Findings: Normal LV size with EF 55-60%.  Normal wall thickness.  No regional WMAs.  The right ventricle was mildly dilated with mildly decreased systolic function.  The right atrium was mildly dilated.  The left atrium was mildly dilated.  There was no LA appendage thrombus.  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  Moderate TR, peak RV-RA gradient 73 mmHg.  Mild mitral regurgitation.  Trileaflet aortic valve with no stenosis or regurgitation.  Normal caliber aorta with grade III-IV plaque in descending thoracic aorta.   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   ASSESSMENT AND PLAN:  Complex patient with evidence of chronic liver disease and ascites who also has pulmonary hypertension and a large ostium secundum ASD. I have personally reviewed his echo images which demonstrate RV/RA dilatation, normal LV function, and an atrial septal defect that measures 15 mm in diameter. The patient's cardiac catheterization data demonstrates mild pulmonary hypertension with a mean PA pressure of 26 mmHg. There is significant left to right shunt based on a QP: QS ratio of 1.8:1. Right-sided cardiac chamber dilatation also is a confirmatory finding for significant right to left shunt. I agree that ASD closure is clearly indicated. I think the patient has anatomic characteristics that will allow for percutaneous ASD closure. I have reviewed the potential risks, indications, and alternatives to percutaneous ASD closure with the patient. He understands the risks include stroke,  myocardial infarction, cardiac perforation, cardiac tamponade, arrhythmia such as atrial fibrillation, device embolization, and late device erosion. He understands the risk of emergency cardiac surgery is extremely low. Considering the patient's circumstances with need for long-term anticoagulation, currently with a subtherapeutic INR, I think it is best to go ahead and do his procedure well he is an inpatient. This will be scheduled for  tomorrow afternoon. Will hold warfarin today and start him back on warfarin tomorrow evening as long as his procedure is uncomplicated. All questions are answered. He should be eligible for hospital discharge on Wednesday as long as no complications arise. Would plan to treat him with warfarin and low-dose aspirin until his INR is therapeutic.  Enzo Bi MD, [facility] 01/16/2017, 12:58 PM

## 2017-01-16 NOTE — Progress Notes (Signed)
Physical Therapy Treatment Patient Details Name: Jeremy Herman MRN: [id number] DOB: 04-Jun-1946 Today's Date: 01/16/2017    History of Present Illness 71 y.o. male.With past medical history significant for  paroxysmal atrial fibrillation and [name], is on chronic anticoagulation came to ED with complaint of worsening shortness of breath lightheadedness and dizziness with ambulation, along with bilateral leg swelling and abdominal swelling for past 3 weeks. Pt with ascites and evidence for RV dysfunction by echo as well as at least moderate pulmonary hypertension, planned cardiac cath 2/16    PT Comments    Pt remains pleasant and eager to return home but limited by dizziness and hypotension.  BP in standing 89/50 , MAP 52, HR 134 asymptomatic After 15' of gait 92/62, MAP 69, HR 133 when pt became dizzy After 30' of gait 99/49, MAP 55, HR 90 unable to ambulate more than 30'  Pt does not have 24hr assist at home, only intermittent assist from neighbors and is unable to ambulate a functional house hold distance. If pt were to D/C home soon a WC may be necessary to prevent syncope with activity given current BP.    Follow Up Recommendations  Home health PT;Supervision/Assistance - 24 hour     Equipment Recommendations  Wheelchair (measurements PT)    Recommendations for Other Services       Precautions / Restrictions Precautions Precautions: Fall Precaution Comments: check BP Restrictions Weight Bearing Restrictions: No    Mobility  Bed Mobility               General bed mobility comments: Pt EOB on arrival  Transfers Overall transfer level: Needs assistance   Transfers: Sit to/from Stand Sit to Stand: Supervision         General transfer comment: supervision for safety to monitor BP  Ambulation/Gait Ambulation/Gait assistance: Min guard Ambulation Distance (Feet): 30 Feet Assistive device: Rolling walker (2 wheeled) Gait Pattern/deviations:  Step-through pattern;Decreased stride length   Gait velocity interpretation: Below normal speed for age/gender General Gait Details: pt stated no dizziness with initial gait but after 15' stated symptoms and was able to return to bed after total of 30' with BP 99/49   Stairs            Wheelchair Mobility    Modified Rankin (Stroke Patients Only)       Balance     Sitting balance-Leahy Scale: Good       Standing balance-Leahy Scale: Good                      Cognition Arousal/Alertness: Awake/alert Behavior During Therapy: WFL for tasks assessed/performed Overall Cognitive Status: Within Functional Limits for tasks assessed                      Exercises General Exercises - Lower Extremity Hip Flexion/Marching: AROM;Both;Seated;10 reps    General Comments        Pertinent Vitals/Pain Pain Assessment: No/denies pain    Home Living                      Prior Function            PT Goals (current goals can now be found in the care plan section) Progress towards PT goals: Progressing toward goals (limited by BP)    Frequency           PT Plan Current plan remains appropriate    Co-evaluation  End of Session Equipment Utilized During Treatment: Gait belt Activity Tolerance: Treatment limited secondary to medical complications (Comment) Patient left: in bed;with call bell/phone within reach;Other (comment) (with cardiologist) Nurse Communication: Mobility status       Time: 1610-9604 PT Time Calculation (min) (ACUTE ONLY): 18 min  Charges:  $Therapeutic Activity: 8-22 mins                    G Codes:       Carrick Rijos B Manvi Guilliams Jan 31, 2017, 2:32 PM  Delaney Meigs, PT [phone]

## 2017-01-16 NOTE — Progress Notes (Signed)
Subjective:  Patient is doing well today. He denies any SOB and dizziness when walking to the bathroom, but has not walked this weekend. Discussed having PT evaluating him today and discharge today or tomorrow after cardiology speaks to him today. Patient would like to go home tomorrow.    Objective:  Vital signs in last 24 hours: Vitals:   01/15/17 1025 01/15/17 1301 01/15/17 2003 01/16/17 0523  BP: (!) 101/59 108/65 97/60 (!) 90/54  Pulse:  (!) 105 (!) 114 (!) 106  Resp:  18 18 18   Temp:  98.3 F (36.8 C) 98.3 F (36.8 C) 98.2 F (36.8 C)  TempSrc:  Oral Oral Oral  SpO2:  99% 96% 96%  Weight:    71.9 kg (158 lb 9.6 oz)  Height:       Physical Exam  Constitutional: He appears well-developed and well-nourished.  Cardiovascular: Normal rate, regular rhythm and normal heart sounds.   Pulmonary/Chest: Effort normal and breath sounds normal.  Abdominal: Soft. Bowel sounds are normal. He exhibits distension.  Musculoskeletal:  Bilateral 2+ pitting edema, L > R leg edema   CBC    Component Value Date/Time   WBC 12.5 (H) 01/16/2017 0229   RBC 2.31 (L) 01/16/2017 0229   HGB 9.9 (L) 01/16/2017 0229   HCT 28.2 (L) 01/16/2017 0229   PLT 179 01/16/2017 0229   MCV 122.1 (H) 01/16/2017 0229   MCH 42.9 (H) 01/16/2017 0229   MCHC 35.1 01/16/2017 0229   RDW 15.1 01/16/2017 0229   LYMPHSABS 1.3 01/10/2017 1028   MONOABS 0.9 01/10/2017 1028   EOSABS 0.0 01/10/2017 1028   BASOSABS 0.0 01/10/2017 1028   BMP Latest Ref Rng & Units 01/16/2017 01/15/2017 01/14/2017  Glucose 65 - 99 mg/dL 960(A117(H) 540(J115(H) 811(B117(H)  BUN 6 - 20 mg/dL 10 6 7   Creatinine 0.61 - 1.24 mg/dL 1.470.63 8.290.64 5.62(Z0.59(L)  Sodium 135 - 145 mmol/L 136 132(L) 134(L)  Potassium 3.5 - 5.1 mmol/L 3.7 3.6 3.6  Chloride 101 - 111 mmol/L 104 102 101  CO2 22 - 32 mmol/L 23 23 23   Calcium 8.9 - 10.3 mg/dL 8.0(L) 7.9(L) 7.8(L)   INR: 1.33  Assessment/Plan:  Active Problems:   Chronic pulmonary embolism (HCC)   Ascites   Pulmonary  embolus (HCC)   Other ascites   Shortness of breath   SOB (shortness of breath)   Pulmonary hypertension, primary (HCC)   RVF (right ventricular failure) (HCC)   Ascites due to alcoholic cirrhosis (HCC)   Atrial septal defect, secundum   71 y.o.malewith h/o paroxysmal AFib,[name],who presented with worsening shortness of breath lightheadedness and dizziness with ambulation,along with bilateral leg swelling and abdominal swellingfor past3 weeks.  Pulmonary HTN w/ hx of chronic PE and ASD Patient had RHC/LHC & TEE Friday. TEE showed Secundum ASD with left to right shunt with mildly dilated and dysfunctional RV. RHC/LHC showed there is a significant left to right shunt secondary to ASD, subtotal occlusion of the PDA , and only mild pulmonary HTN. Does not seem like chronic PE & ASD is the etiology of ascites, though they may still contribute. Cardiology will meet with patient today to evaluate patient for elective procedure to close ASD. Patient will continue his anticoagulation regimen.  -- Cardiology f/u Monday -- restart anticoag regimen for chronic PE PPX  Liver Cirrhosis Liver imaging is suggestive of liver cirrhosis. Patient has multiple potential contributing factors. Both U/S and CT showed features of fatty liver. Patient also has a history of  alcohol abuse, although patient says he stopped drinking 16 years ago. While working up anemia, patient was found to have very high % saturation and ferritin suggestive of hemochromatosis. Patient will likely need liver biopsy to confirm etiology of cirrhosis. Patient negative for Hep B. Patient ultimately needs liver biopsy to confirm etiology of his liver cirrhosis as this is the most likely source of his symptoms currently. Will likely have liver biopsy outpatient and f/u with Heme and/or GI on outpatient. Fluid is re accumulating and Patient's BP has been on the lower side most likely due to decreased SVR 2/2 liver cirrhosis. We  will start midodrine to increase MAP and cont 100 mg spirolactone. Will have patient follow up with PCP to adjust doses as needed.   -- start midodrine 10 mg QD -- cont spironolactone 100 mg QD -- OP liver liver biopsy -- Cardiology consult today -- [id number] testing pending -- f/u w/ GI and/or Heme OP  Anemia Patient came in with hgb of 9.9 with MCV in the 120s. We worked up anemia and found along with abnormal iron studies detailed above low folate and lower normal B12. Will check MMA to see if patient is B12 deficient.  -- started Folic acid supplements -- MMA pending   Complex Cyst of Kidney CT abdomen w/ & w/o contrast reveal that Kidney cyst are Bosniak category 1 & 2 -- f/u outpatient  Subclinical Hypothyroidism T4 is normal. No need to treat at this time.   Fluids: None Diet:Regular Diet DVT Prophylaxis: Coumadin Code Status: FULL  Dispo: Anticipated discharge in approximately 0-1 day(s)  Caryn Bee, Medical Student 01/16/2017, 11:15 AM Pager: [phone]

## 2017-01-16 NOTE — Progress Notes (Signed)
Patient ID: Jeremy Herman, male   DOB: 1946-02-25, 71 y.o.   MRN: [id number]   SUBJECTIVE:  Overall feeling ok. Denies SOB. Intermittent dizziness standing.    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   Scheduled Meds: . atorvastatin  20 mg Oral q1800  . diltiazem  120 mg Oral Daily  . folic acid  1 mg Oral Daily  . midodrine  10 mg Oral TID WC  . sodium chloride flush  3 mL Intravenous Q12H  . sodium chloride flush  3 mL Intravenous Q12H  . spironolactone  100 mg Oral Daily  . Warfarin - Pharmacist Dosing Inpatient   Does not apply q1800   Continuous Infusions:  PRN Meds:.sodium chloride, acetaminophen, ondansetron (ZOFRAN) IV, senna-docusate, sodium chloride flush    Vitals:   01/15/17 1025 01/15/17 1301 01/15/17 2003 01/16/17 0523  BP: (!) 101/59 108/65 97/60 (!) 90/54  Pulse:  (!) 105 (!) 114 (!) 106  Resp:  18 18 18   Temp:  98.3 F (36.8 C) 98.3 F (36.8 C) 98.2 F (36.8 C)  TempSrc:  Oral Oral Oral  SpO2:  99% 96% 96%  Weight:    158 lb 9.6 oz (71.9 kg)  Height:        Intake/Output Summary (Last 24 hours) at 01/16/17 1118 Last data filed at 01/16/17 0600  Gross per 24 hour  Intake              720 ml  Output              300 ml  Net              420 ml    LABS: Basic Metabolic Panel:  Recent Labs  40/98/1102/18/18 0257 01/16/17 0229  NA 132* 136  K 3.6 3.7  CL 102 104  CO2 23 23  GLUCOSE 115* 117*  BUN 6 10  CREATININE 0.64 0.63  CALCIUM 7.9* 8.0*   Liver Function Tests: No results for input(s): AST, ALT, ALKPHOS, BILITOT, PROT, ALBUMIN in the last 72 hours. No results for input(s): LIPASE, AMYLASE in the last 72 hours. CBC:  Recent Labs  01/15/17 0257 01/16/17 0229  WBC 14.1* 12.5*  HGB 9.9* 9.9*  HCT 27.7* 28.2*  MCV 124.2* 122.1*  PLT 149* 179   Cardiac Enzymes: No results for input(s): CKTOTAL, CKMB, CKMBINDEX, TROPONINI in the last 72 hours. BNP: Invalid input(s): POCBNP D-Dimer: No results for input(s): DDIMER in the last 72 hours. Hemoglobin A1C: No results for input(s): HGBA1C in the last 72 hours. Fasting Lipid Panel: No results for input(s): CHOL, HDL, LDLCALC, TRIG, CHOLHDL, LDLDIRECT in the last 72 hours. Thyroid Function Tests: No results for input(s): TSH, T4TOTAL, T3FREE, THYROIDAB in the last 72 hours.  Invalid input(s): FREET3 Anemia Panel: No results for input(s): VITAMINB12, FOLATE, FERRITIN, TIBC, IRON, RETICCTPCT in the last 72 hours.  RADIOLOGY: Ct Abdomen Pelvis W Wo Contrast  Result Date: 01/12/2017 CLINICAL DATA:  Evaluate complex cyst identified on recent ultrasound. EXAM: CT ABDOMEN AND PELVIS WITHOUT AND WITH CONTRAST TECHNIQUE: Multidetector CT imaging of the abdomen and pelvis was performed following the standard protocol before and following the bolus administration  of intravenous contrast. CONTRAST:  ISOVUE-300 IOPAMIDOL (ISOVUE-300) INJECTION 61% COMPARISON:  None FINDINGS: Lower chest: No acute abnormality. Hepatobiliary: Hepatic steatosis. There is a diffuse changes of cirrhosis involving the liver. No enhancing liver abnormalities identified to suggest hepatoma. The portal vein remains patent. Unremarkable appearance of the gallbladder. Pancreas: Normal appearance of the pancreas. Spleen: Normal in size without focal abnormality. Adrenals/Urinary Tract: Bilateral kidney lesions are identified. The complex midpole lesion arising from the lateral aspect of the right kidney is identified measuring 2.3 cm and 14 HU precontrast, image 78 of series 2. There is no significant enhancement within this lesion following the IV administration of contrast material with postcontrast Hounsfield  units equal to 18.39. Hyperdense precontrast lesion arising from the mid left kidney measures 1.3 cm and 70 HU precontrast, image 64 of series 2. Postcontrast Hounsfield units equal 65.88. No internal septation or enhancing nodularity identified. No hydronephrosis identified. The urinary bladder appears within normal limits. Stomach/Bowel: The stomach is normal. The small bowel loops have a normal course and caliber. No bowel obstruction. Unremarkable appearance of the colon. Vascular/Lymphatic: Aortic atherosclerosis. No aneurysm. No upper abdominal or pelvic adenopathy. No inguinal adenopathy noted. Reproductive: Prostate is unremarkable. Other: Moderate to large volume ascites identified within the abdomen and pelvis. Musculoskeletal: No acute or significant osseous findings. IMPRESSION: 1. 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 2. Morphologic features a liver compatible with cirrhosis. 3. Ascites. 4. Aortic atherosclerosis. Electronically Signed   By: Signa Kell M.D.   On: 01/12/2017 13:22   X-ray Chest Pa And Lateral  Result Date: 01/11/2017 CLINICAL DATA:  Shortness of breath for 4 weeks EXAM: CHEST  2 VIEW COMPARISON:  01/10/2017 FINDINGS: Cardiomediastinal silhouette is stable. No infiltrate or pleural effusion. No pulmonary edema. Again noted degenerative changes thoracic spine. IMPRESSION: No active cardiopulmonary disease. Electronically Signed   By: Natasha Mead M.D.   On: 01/11/2017 11:21   Ct Angio Chest Pe W Or Wo Contrast  Result Date: 01/10/2017 CLINICAL DATA:  Bilateral leg swelling, shortness of breath. EXAM: CT ANGIOGRAPHY CHEST WITH CONTRAST TECHNIQUE: Multidetector CT imaging of the chest was performed using the standard protocol during bolus administration of intravenous contrast. Multiplanar CT image reconstructions and MIPs were obtained to evaluate the vascular anatomy. CONTRAST:  70 cc Isovue 370 IV COMPARISON:  07/08/2014 FINDINGS:  Cardiovascular: Improving clot burden within the pulmonary arteries. There is eccentric chronic clot within the left lower lobe segmental pulmonary artery, best seen on image 51 of series 4. Minimal residual filling defect noted within the right lower lobe pulmonary arterial branch on image 55. Note new areas of thromboembolism. Heart is normal size. Aorta is normal caliber. Extensive diffuse coronary artery calcifications and scattered aortic calcifications. Mediastinum/Nodes: No mediastinal, hilar, or axillary adenopathy. Lungs/Pleura: Nodular area in the right upper lobe on image 41 is stable since prior study and compatible with scarring. Scarring in the right lower lobe, stable. No new airspace opacities or effusions. Upper Abdomen: Severe diffuse low-density throughout the liver compatible with fatty infiltration. Ascites noted adjacent to the liver and spleen. Musculoskeletal: Chest wall soft tissues are unremarkable. No acute bony abnormality. Review of the MIP images confirms the above findings. IMPRESSION: Partial resolution of the previously seen chronic pulmonary emboli. Mild residual eccentric chronic emboli noted in both lower lobes. No new/ acute pulmonary emboli. Advanced coronary artery disease. Severe low-density throughout the liver, likely fatty infiltration. Upper abdominal ascites adjacent to the liver and spleen. Electronically Signed  By: Charlett Nose M.D.   On: 01/10/2017 16:47   US Abdomen Complete  Result Date: 01/10/2017 CLINICAL DATA:  Abdominal swelling for 3 weeks EXAM: ABDOMEN ULTRASOUND COMPLETE COMPARISON:  None. FINDINGS: Gallbladder: No gallstones are noted within gallbladder. There is some layering gallbladder sludge. No thickening of gallbladder wall. No sonographic Murphy's sign. Common bile duct: Diameter: 3.5 mm in diameter within normal limits. Liver: No focal hepatic mass. There is diffuse increased echogenicity of the liver suspicious for fatty infiltration or chronic  liver disease. IVC: Not well seen due to bowel gas and abdominal ascites. Pancreas: Not well seen due to abdominal ascites. Spleen: Size and appearance within normal limits. Measures 7 cm in length. Right Kidney: Length: 13.5 cm in length. No hydronephrosis or renal calculi. There is a complex possible cystic hypoechoic exophytic lesion in midpole of the left kidney measures 2.2 x 2.2 cm further correlation with enhanced CT or MRI is recommended to exclude a complex enhancing lesion. Left Kidney: Length: 12.7 cm. No hydronephrosis or renal calculi. A cyst in midpole measures 1.8 x 1.2 cm. Abdominal aorta: No aneurysm visualized. Measures up to 2.4 cm in diameter. Other findings: Abdominal ascites is noted. IMPRESSION: 1. No gallstones are noted within gallbladder. Small layering gallbladder sludge. Normal CBD. No sonographic Murphy's sign. 2. Heterogeneous increased echogenicity of the liver which may be due to fatty infiltration or chronic liver disease. 3. No hydronephrosis. There is a complex exophytic lesion midpole of the right kidney measures 2.2 x 2.2 cm. Further evaluation with enhanced CT or MRI is recommended to exclude complex cystic neoplasm. 4. No left hydronephrosis. A cyst in midpole of the left kidney measures 1.8 x 1.2 cm. 5. No aortic aneurysm.  Abdominal ascites is noted. Electronically Signed   By: Natasha Mead M.D.   On: 01/10/2017 16:06   US Paracentesis  Result Date: 01/11/2017 INDICATION: Ascites of unknown etiology. Request is made for diagnostic and therapeutic paracentesis. EXAM: ULTRASOUND GUIDED DIAGNOSTIC AND THERAPEUTIC PARACENTESIS MEDICATIONS: 1% lidocaine COMPLICATIONS: None immediate. PROCEDURE: Informed written consent was obtained from the patient after a discussion of the risks, benefits and alternatives to treatment. A timeout was performed prior to the initiation of the procedure. Initial ultrasound scanning demonstrates a moderate amount of ascites within the right lower  abdominal quadrant. The right lower abdomen was prepped and draped in the usual sterile fashion. 1% lidocaine was used for local anesthesia. Following this, a 19 gauge, 7-cm, Yueh catheter was introduced. An ultrasound image was saved for documentation purposes. The paracentesis was performed. The catheter was removed and a dressing was applied. The patient tolerated the procedure well without immediate post procedural complication. FINDINGS: A total of approximately 2.7 L of yellow fluid was removed. Samples were sent to the laboratory as requested by the clinical team. IMPRESSION: Successful ultrasound-guided paracentesis yielding 2.7 liters of peritoneal fluid. Read by: Barnetta Chapel, PA-C Electronically Signed   By: Judie Petit.  Shick M.D.   On: 01/11/2017 11:38   Dg Chest Port 1 View  Result Date: 01/10/2017 CLINICAL DATA:  Shortness of Breath EXAM: PORTABLE CHEST 1 VIEW COMPARISON:  04/20/2016 FINDINGS: Heart and mediastinal contours are within normal limits. No focal opacities or effusions. No acute bony abnormality. IMPRESSION: No active disease. Electronically Signed   By: Charlett Nose M.D.   On: 01/10/2017 10:53    PHYSICAL EXAM General: NAD in bed.  Neck: No JVD, no thyromegaly or thyroid nodule.  Lungs: CTAB.  CV: Nondisplaced PMI.  Heart mildly tachy,  regular S1/S2, no S3/S4, no murmur.  R and LLE 1-2 +  edema.   Abdomen: Soft, nontender, no hepatosplenomegaly, ++ distended. .  Neurologic: Alert and oriented x 3.  Psych: Normal affect. Extremities: No clubbing or cyanosis. R and LLE with multiple varicosities.   TELEMETRY: NSR 90s   ASSESSMENT AND PLAN: 71 yo with history of PE/DVT now with evidence for chronic PE on CTA, aspiration pneumonia, prior heavy ETOH but now quit, secundum ASD noted on TEE in 2012 admitted with dyspnea and abdominal swelling.  Found to have ascites and evidence for RV dysfunction by echo as well as at least moderate pulmonary hypertension.  1. RV failure/pulmonary  hypertension: Echo 2/18 with EF 60-65%, moderately dilated RV with normal systolic function, PASP 57, mild TR, no mention made of ASD.  TEE showed RV dilation and secundum ASD measuring 1.5 cm.  RHC/LHC -->L to R shunt at atrial level with ASD, Qp/Qs 1.8/1.  Severe pulmonary hypertension by echo but only mild PH on RHC.  Jeremy Herman increased this am to 200 mg daily. Renal function stable.  2. 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  Never had outpatient cardiology followup. TEE/Cath with significant ASD and left to right shunt as above.  - Dr Excell Seltzer consulted for percutaneous closure 3. Ascites: Possible hepatic congestion from RV failure versus ETOH cirrhosis. Imaging showed fatty liver and possible cirrhosis.  HCV negative, no ETOH x 16 years. S/P paracentesis 2/14 with 2.7 liters removed.  4. 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  Continue anticoagulation. 5. Atrial fibrillation: Paroxysmal.  He is currently in NSR. INR per pharmacy. Continue coumadin.  6. CAD: Subtotal occlusion PDA with collaterals from left. Medical management, added statin.   Jeremy Clegg NP-C  01/16/2017 11:18 AM   Patient seen with NP, agree with the above note.  Cath showed chronic occlusion of PDA, to be managed medically.  Only mild pulmonary hypertension on RHC, but there is a secundum ASD with significant shunt and RV dilation.  This will need to be closed.  Discussed with Dr. Excell Seltzer, plan for tomorrow afternoon ASD closure.  Hold warfarin, restart after procedure.   Jeremy Herman 01/16/2017 1:41 PM

## 2017-01-16 NOTE — Progress Notes (Signed)
Internal Medicine Attending:   I saw and examined the patient. I reviewed the resident's note and I agree with the resident's findings and plan as documented in the resident's note.  Patient feels well today with no new complaints. His blood pressures remain borderline. He continues to accumulate fluid secondary to his underlying cirrhosis. Given his borderline blood pressures and need to increase his diuretics will start him on Midodrine today in an attempt to increase his blood pressures so that he will tolerate an increased dose of diuretics. The etiology is underlying cirrhosis is likely secondary to hemochromatosis given his elevated transferrin saturation and his elevated ferritin. 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 He will need outpatient follow-up with GI  Cardiology follow-up appreciated. Patient with significant left to right shunt on his cardiac catheterization secondary to a secundum ASD. Patient is scheduled for closure of this in the a.m. We will resume anticoagulation with warfarin once the procedure is over.

## 2017-01-17 ENCOUNTER — Encounter (HOSPITAL_COMMUNITY): Payer: Self-pay | Admitting: General Practice

## 2017-01-17 ENCOUNTER — Other Ambulatory Visit: Payer: Self-pay

## 2017-01-17 ENCOUNTER — Encounter (HOSPITAL_COMMUNITY)
Admission: EM | Disposition: A | Discharge status: Home / Self Care | Payer: Self-pay | Source: Home / Self Care | Attending: Internal Medicine

## 2017-01-17 DIAGNOSIS — L8990 Pressure ulcer of unspecified site, unspecified stage: Secondary | ICD-10-CM | POA: Insufficient documentation

## 2017-01-17 HISTORY — PX: ASD REPAIR: SHX258

## 2017-01-17 HISTORY — PX: ATRIAL SEPTAL DEFECT(ASD) CLOSURE: CATH118299

## 2017-01-17 LAB — BASIC METABOLIC PANEL
Anion gap: 7 (ref 5–15)
BUN: 11 mg/dL (ref 6–20)
CHLORIDE: 103 mmol/L (ref 101–111)
CO2: 24 mmol/L (ref 22–32)
CREATININE: 0.57 mg/dL — AB (ref 0.61–1.24)
Calcium: 8.2 mg/dL — ABNORMAL LOW (ref 8.9–10.3)
GFR calc non Af Amer: >60 mL/min (ref >60)
Glucose, Bld: 115 mg/dL — ABNORMAL HIGH (ref 65–99)
Potassium: 4.2 mmol/L (ref 3.5–5.1)
Sodium: 134 mmol/L — ABNORMAL LOW (ref 135–145)

## 2017-01-17 LAB — CBC
HCT: 27.2 % — ABNORMAL LOW (ref 39.0–52.0)
Hemoglobin: 9.5 g/dL — ABNORMAL LOW (ref 13.0–17.0)
MCH: 42.6 pg — AB (ref 26.0–34.0)
MCHC: 34.9 g/dL (ref 30.0–36.0)
MCV: 122 fL — AB (ref 78.0–100.0)
PLATELETS: 183 10*3/uL (ref 150–400)
RBC: 2.23 MIL/uL — AB (ref 4.22–5.81)
RDW: 15 % (ref 11.5–15.5)
WBC: 11.5 10*3/uL — ABNORMAL HIGH (ref 4.0–10.5)

## 2017-01-17 LAB — POCT ACTIVATED CLOTTING TIME
ACTIVATED CLOTTING TIME: 186 s
Activated Clotting Time: 213 seconds
Activated Clotting Time: 246 seconds

## 2017-01-17 LAB — PROTIME-INR
INR: 1.47
Prothrombin Time: 17.9 seconds — ABNORMAL HIGH (ref 11.4–15.2)

## 2017-01-17 SURGERY — ATRIAL SEPTAL DEFECT (ASD) CLOSURE

## 2017-01-17 MED ORDER — HEPARIN (PORCINE) IN NACL 2-0.9 UNIT/ML-% IJ SOLN
INTRAMUSCULAR | Status: DC | PRN
  Administered 2017-01-17: 1000 mL

## 2017-01-17 MED ORDER — MIDAZOLAM HCL 2 MG/2ML IJ SOLN
INTRAMUSCULAR | Status: AC
  Filled 2017-01-17: qty 2

## 2017-01-17 MED ORDER — SODIUM CHLORIDE 0.9 % IV SOLN: 250.0000 mL | INTRAVENOUS | Status: DC | PRN

## 2017-01-17 MED ORDER — HEPARIN SODIUM (PORCINE) 1000 UNIT/ML IJ SOLN
INTRAMUSCULAR | Status: AC
  Filled 2017-01-17: qty 1

## 2017-01-17 MED ORDER — CEFAZOLIN IN D5W 1 GM/50ML IV SOLN
1.0000 g | INTRAVENOUS | Status: DC
  Filled 2017-01-17: qty 50

## 2017-01-17 MED ORDER — SODIUM CHLORIDE 0.9% FLUSH: 3.0000 mL | Freq: Two times a day (BID) | INTRAVENOUS | Status: DC

## 2017-01-17 MED ORDER — HEPARIN SODIUM (PORCINE) 1000 UNIT/ML IJ SOLN
INTRAMUSCULAR | Status: DC | PRN
  Administered 2017-01-17: 5000 [IU] via INTRAVENOUS
  Administered 2017-01-17: 3000 [IU] via INTRAVENOUS

## 2017-01-17 MED ORDER — LIDOCAINE HCL (PF) 1 % IJ SOLN
INTRAMUSCULAR | Status: AC
  Filled 2017-01-17: qty 30

## 2017-01-17 MED ORDER — FUROSEMIDE 40 MG PO TABS
40.0000 mg | ORAL_TABLET | Freq: Every day | ORAL | Status: DC
  Administered 2017-01-17 – 2017-01-18 (×2): 40 mg via ORAL
  Filled 2017-01-17 (×2): qty 1

## 2017-01-17 MED ORDER — WARFARIN - PHARMACIST DOSING INPATIENT: Freq: Every day | Status: DC

## 2017-01-17 MED ORDER — SODIUM CHLORIDE 0.9% FLUSH: 3.0000 mL | INTRAVENOUS | Status: DC | PRN

## 2017-01-17 MED ORDER — CEFAZOLIN SODIUM-DEXTROSE 2-3 GM-% IV SOLR
INTRAVENOUS | Status: DC | PRN
  Administered 2017-01-17: 2 g via INTRAVENOUS

## 2017-01-17 MED ORDER — HEPARIN (PORCINE) IN NACL 2-0.9 UNIT/ML-% IJ SOLN
INTRAMUSCULAR | Status: AC
  Filled 2017-01-17: qty 1000

## 2017-01-17 MED ORDER — WARFARIN SODIUM 5 MG PO TABS
2.5000 mg | ORAL_TABLET | Freq: Once | ORAL | Status: AC
  Administered 2017-01-17: 2.5 mg via ORAL
  Filled 2017-01-17: qty 0.5

## 2017-01-17 MED ORDER — CEFAZOLIN SODIUM-DEXTROSE 2-4 GM/100ML-% IV SOLN
INTRAVENOUS | Status: AC
  Filled 2017-01-17: qty 100

## 2017-01-17 MED ORDER — FENTANYL CITRATE (PF) 100 MCG/2ML IJ SOLN
INTRAMUSCULAR | Status: DC | PRN
  Administered 2017-01-17 (×2): 25 ug via INTRAVENOUS

## 2017-01-17 MED ORDER — FENTANYL CITRATE (PF) 100 MCG/2ML IJ SOLN
INTRAMUSCULAR | Status: AC
  Filled 2017-01-17: qty 2

## 2017-01-17 MED ORDER — IOPAMIDOL (ISOVUE-370) INJECTION 76%
INTRAVENOUS | Status: AC
  Filled 2017-01-17: qty 50

## 2017-01-17 MED ORDER — MIDAZOLAM HCL 2 MG/2ML IJ SOLN
INTRAMUSCULAR | Status: DC | PRN
  Administered 2017-01-17 (×2): 1 mg via INTRAVENOUS

## 2017-01-17 SURGICAL SUPPLY — 20 items
BALLN SIZING AMPLATZER 24 (BALLOONS) ×2
BALLN SIZING AMPLATZER 24MM (BALLOONS) ×1
BALLOON SIZING AMPLATZER 24 (BALLOONS) ×1 IMPLANT
CATH ACUNAV REPROCESSED (CATHETERS) ×3 IMPLANT
CATH SUPER TORQUE PLUS 6F MPA1 (CATHETERS) ×3 IMPLANT
COVER SWIFTLINK CONNECTOR (BAG) ×3 IMPLANT
GUIDEWIRE AMPLATZER 1.5JX260 (WIRE) ×3 IMPLANT
GUIDEWIRE ANGLED .035X150CM (WIRE) ×3 IMPLANT
KIT HEART LEFT (KITS) ×3 IMPLANT
OCCLUDER AMPLATZER SEPTAL 20MM (Prosthesis & Implant Heart) ×3 IMPLANT
PACK CARDIAC CATHETERIZATION (CUSTOM PROCEDURE TRAY) ×3 IMPLANT
PROTECTION STATION PRESSURIZED (MISCELLANEOUS) ×3
SHEATH INTROD W/O MIN 9FR 25CM (SHEATH) ×3 IMPLANT
SHEATH PINNACLE 8F 10CM (SHEATH) ×3 IMPLANT
SHEATH PINNACLE 9F 10CM (SHEATH) ×3 IMPLANT
STATION PROTECTION PRESSURIZED (MISCELLANEOUS) ×1 IMPLANT
SYSTEM DELIVERY AMPLATZER 9FR (SHEATH) ×3 IMPLANT
TRANSDUCER W/STOPCOCK (MISCELLANEOUS) ×3 IMPLANT
TUBING CIL FLEX 10 FLL-RA (TUBING) ×3 IMPLANT
WIRE EMERALD 3MM-J .035X150CM (WIRE) ×3 IMPLANT

## 2017-01-17 NOTE — Progress Notes (Signed)
Subjective: Patient opted to get ASD fixed today. Patient is doing well and denies any dizziness or SOB. Patient will have procedure today and go home tomorrow.   Objective:  Vital signs in last 24 hours: Vitals:   01/16/17 2003 01/17/17 0438 01/17/17 0538 01/17/17 1000  BP: (!) 97/56 97/62  (!) 94/55  Pulse: (!) 121 (!) 102  (!) 105  Resp: 20 20    Temp: 99.5 F (37.5 C) 98.2 F (36.8 C)    TempSrc: Oral Oral    SpO2: 94% 98%    Weight:   72.6 kg (160 lb 1.6 oz)   Height:       Physical Exam  Constitutional: He appears well-developed and well-nourished.  Cardiovascular: Normal rate, regular rhythm and normal heart sounds.   Pulmonary/Chest: Effort normal and breath sounds normal.  Abdominal: Soft. Bowel sounds are normal. He exhibits distension.  Musculoskeletal:  2+ pitting edema, L>R leg edema   CBC    Component Value Date/Time   WBC 11.5 (H) 01/17/2017 0310   RBC 2.23 (L) 01/17/2017 0310   HGB 9.5 (L) 01/17/2017 0310   HCT 27.2 (L) 01/17/2017 0310   PLT 183 01/17/2017 0310   MCV 122.0 (H) 01/17/2017 0310   MCH 42.6 (H) 01/17/2017 0310   MCHC 34.9 01/17/2017 0310   RDW 15.0 01/17/2017 0310   LYMPHSABS 1.3 01/10/2017 1028   MONOABS 0.9 01/10/2017 1028   EOSABS 0.0 01/10/2017 1028   BASOSABS 0.0 01/10/2017 1028   BMP Latest Ref Rng & Units 01/17/2017 01/16/2017 01/15/2017  Glucose 65 - 99 mg/dL 098(J115(H) 191(Y117(H) 782(N115(H)  BUN 6 - 20 mg/dL 11 10 6   Creatinine 0.61 - 1.24 mg/dL 5.62(Z0.57(L) 3.080.63 6.570.64  Sodium 135 - 145 mmol/L 134(L) 136 132(L)  Potassium 3.5 - 5.1 mmol/L 4.2 3.7 3.6  Chloride 101 - 111 mmol/L 103 104 102  CO2 22 - 32 mmol/L 24 23 23   Calcium 8.9 - 10.3 mg/dL 8.2(L) 8.0(L) 7.9(L)   INR: 1.47  Assessment/Plan:  Active Problems:   Chronic pulmonary embolism (HCC)   Ascites   Pulmonary embolus (HCC)   Other ascites   Shortness of breath   SOB (shortness of breath)   Pulmonary hypertension, primary (HCC)   RVF (right ventricular failure) (HCC)  Ascites due to alcoholic cirrhosis (HCC)   Atrial septal defect, secundum   Malnutrition of moderate degree   [name],[name],who presented with worsening shortness of breath lightheadedness and dizziness with ambulation,along with bilateral leg swelling and abdominal swellingfor past3 weeks.  Pulmonary HTN w/ hx of chronic PE and ASD Patient had RHC/LHC &TEE Friday. TEE showed Secundum ASD with left to right shunt withmildly dilated and dysfunctional RV. RHC/LHC showed there is a significant left to right shunt secondary to ASD, subtotal occlusion of the PDA , and only mild pulmonary HTN. Does not seem like chronic PE &ASD is the etiology of ascites, though they may still contribute. Cardiology is performing ASD closure today. Patient will continue his anticoagulation regimen. Will go home tomorrow.  -- ASD closure today -- cont anticoag regimen for chronic PE PPX  Liver Cirrhosis Liver imaging is suggestive of liver cirrhosis. Patient has multiple potential contributing factors. Both U/S and CT showed features of fatty liver. Patient also has a history of alcohol abuse, although patient says he stopped drinking 16 years ago. While working up anemia, patient was found to have very high % saturation and ferritin suggestive of hemochromatosis. Patient will likely  need liver biopsy to confirm etiology of cirrhosis. Patient negative for Hep B. Patient ultimately needs liver biopsy to confirm etiology of his liver cirrhosis as this is the most likely source of his symptoms currently. Will likely have liver biopsy outpatient and f/u with Heme and/or GI on outpatient. Fluid is re accumulating and Patient's BP has been on the lower side most likely due to decreased SVR 2/2 liver cirrhosis. We will start midodrine to increase MAP and increase to 200 mg spirolactone.  Per cardiology, we have added 40 mg Lasix and discontinued Cardizem. Will have patient follow  up with PCP to adjust doses as needed.   -- start midodrine 10 mg QD -- increase spironolactone 200 mg QD -- start 40 mg Lasix  QD per Cardiology -- OP liver liver biopsy -- [id number] testing pending -- f/u w/ GI and/or Heme OP  Anemia Patient came in with hgb of 9.9 with MCV in the 120s. We worked up anemia and found along with abnormal iron studies detailed above low folate and lower normal B12. Will check MMA to see if patient is B12 deficient.  -- started Folic acid supplements -- MMA pending   Complex Cyst of Kidney CT abdomen w/ & w/o contrast reveal that Kidney cyst are Bosniak category 1 & 2 -- f/u outpatient  Subclinical Hypothyroidism T4 is normal. No need to treat at this time.   Fluids: None Diet:Regular Diet DVT Prophylaxis: Coumadin Code Status: FULL  Dispo: Anticipated discharge in approximately 0-1 day(s) Caryn Bee, Medical Student 01/17/2017, 11:25 AM Pager: [phone]

## 2017-01-17 NOTE — Progress Notes (Signed)
ANTICOAGULATION CONSULT NOTE - Initial Consult  Pharmacy Consult for warfarin Indication: hx DVT/PE  Allergies  Allergen Reactions  . Fish Allergy Anaphylaxis and Other (See Comments)    Diarrhea     Patient Measurements: Height: 6' (182.9 cm) Weight: 160 lb 1.6 oz (72.6 kg) IBW/kg (Calculated) : 77.6   Vital Signs: Temp: 97.5 F (36.4 C) (02/20 1232) Temp Source: Oral (02/20 1232) BP: 103/63 (02/20 1835) Pulse Rate: 96 (02/20 1835)  Labs:  Recent Labs  01/15/17 0257 01/15/17 0710 01/16/17 0229 01/16/17 1107 01/17/17 0310  HGB 9.9*  --  9.9*  --  9.5*  HCT 27.7*  --  28.2*  --  27.2*  PLT 149*  --  179  --  183  LABPROT 20.4*  --  16.6* 15.9* 17.9*  INR 1.73  --  1.33 1.27 1.47  HEPARINUNFRC  --  0.69  --   --   --   CREATININE 0.64  --  0.63  --  0.57*    Estimated Creatinine Clearance: 89.5 mL/min (by C-G formula based on SCr of 0.57 mg/dL (L)).  Assessment: [age] YOM s/p ASD repair on 2/20 on warfarin PTA for hx DVT/PE. Warfarin was being given, then held starting 2/15 due to procedures. Was on heparin, but then stopped. Warfarin to resume this evening as per cardiology plans. INR 1.47 today. Hgb 9.5, plts 183- no overt bleeding noted from procedure.  Home dose of warfarin 2mg  daily except 1mg  on TTSat.  Goal of Therapy:  INR 2-3 Monitor platelets by anticoagulation protocol: Yes   Plan:  -warfarin 2.5mg  po x1 tonight -daily INR -follow s/s bleeding and medication changes that may affect INR  Gumaro Brightbill D. Makeyla Govan, PharmD, BCPS Clinical Pharmacist Pager: [phone] 01/17/2017 6:50 PM

## 2017-01-17 NOTE — Progress Notes (Signed)
Internal Medicine Attending:   I saw and examined the patient. I reviewed the resident's note and I agree with the resident's findings and plan as documented in the resident's note.  Patient feels well today with no new complaints. He still noted to be tachycardic in the 100s. Blood pressure has improved slightly on Midodrine. His Cardizem has now been stopped as he is remained in normal sinus rhythm and his blood pressure is borderline. Continue with spironolactone and Lasix for diuresis given his ascites and lower extremity edema. Patient noted to have only mild pulmonary hypertension on cardiac catheterization. No need to institute vasodilators for now per cardiology. Pulmonary hypertension likely secondary to his large secundum ASD as well as chronic pulmonary emboli. Patient to have closure of ASD done today. We'll need to resume his anticoagulation after his procedure today.  The etiology of his cirrhosis remains uncertain. We'll follow-up C2Y82 gene mutation for hemochromatosis given his elevated ferritin and transferrin saturation. Patient will need outpatient follow-up with GI and hematology as an outpatient.

## 2017-01-17 NOTE — Progress Notes (Signed)
   Subjective: Patient has no complaints today. Feeling better and waiting for his procedure to close ASD later today.  Objective:  Vital signs in last 24 hours: Vitals:   01/16/17 2003 01/17/17 0438 01/17/17 0538 01/17/17 1000  BP: (!) 97/56 97/62  (!) 94/55  Pulse: (!) 121 (!) 102  (!) 105  Resp: 20 20    Temp: 99.5 F (37.5 C) 98.2 F (36.8 C)    TempSrc: Oral Oral    SpO2: 94% 98%    Weight:   160 lb 1.6 oz (72.6 kg)   Height:       Gen. Little emaciated man, lying comfortably in bed, in no acute distress. Chest. Clear bilaterally, no rhonchi or wheeze. Abdomen. Soft, nontender, distended (less tense as compared to yesterday) bowel sounds positive. Extremities. Bilateral 2+ pitting edema up to just above his ankle. peripheral edema little worse as compared to yesterday.    Labs. CBC Latest Ref Rng & Units 01/17/2017 01/16/2017 01/15/2017  WBC 4.0 - 10.5 K/uL 11.5(H) 12.5(H) 14.1(H)  Hemoglobin 13.0 - 17.0 g/dL 1.6(X9.5(L) 0.9(U9.9(L) 0.4(V9.9(L)  Hematocrit 39.0 - 52.0 % 27.2(L) 28.2(L) 27.7(L)  Platelets 150 - 400 K/uL 183 179 149(L)   BMP Latest Ref Rng & Units 01/17/2017 01/16/2017 01/15/2017  Glucose 65 - 99 mg/dL 409(W115(H) 119(J117(H) 478(G115(H)  BUN 6 - 20 mg/dL 11 10 6   Creatinine 0.61 - 1.24 mg/dL 9.56(O0.57(L) 1.300.63 8.650.64  Sodium 135 - 145 mmol/L 134(L) 136 132(L)  Potassium 3.5 - 5.1 mmol/L 4.2 3.7 3.6  Chloride 101 - 111 mmol/L 103 104 102  CO2 22 - 32 mmol/L 24 23 23   Calcium 8.9 - 10.3 mg/dL 8.2(L) 8.0(L) 7.9(L)   INR. 1.47  Assessment/Plan:  Jeremy BaneJames Shamlinis a 70 y.o.male.With past medical history significant for paroxysmal atrial fibrillation and [name], is on chronic anticoagulation came to ED with complaint of worsening shortness of breath lightheadedness and dizziness with ambulation,along with bilateral leg swelling and abdominal swellingfor past3 weeks.  Liver cirrhosis.Abdomen is less tense, lower extremity edema is little worse than yesterday. His Child-Pugh score  is 9--Child class B. MELD Score of 14>> <2% mortality within 90 days. -Spironolactone was increased to 200 today. - Lasix 40 mg daily was added by cardiology. -Continue midodrine at 10 mg 3 times a day. -[id number] mutation for hemochromatosis-results are pending. He will need a GI and hematology consult as an outpatient.  Pulmonary hypertension with history of chronic PE and ASD. Going for ASD percutaneous repair today. -No other medical management needed considering mild pulmonary hypertension at this point.  Macrocytic Anemia. -Continue folic acid. -MMAis still pending.  H/O PAF. Currently sinus rhythm. -Continue anticoagulation  -D/C diltiazem per cardiology.  Complex cyst on the right kidney.Most likely benign. -Can be followed up as an outpatient.  Dispo: Anticipated discharge in approximately 1 day(s). Dependent on his procedure today.  Arnetha CourserSumayya Makenleigh Crownover, MD 01/17/2017, 11:53 AM Pager: [phone]

## 2017-01-17 NOTE — Progress Notes (Signed)
Patients heart rate elevated this am into 130-150's. md notified and exg checked pt is still in SR.   No action needed at this time  Hillery Hunteraraher, Tanvi Gatling Brooke RN

## 2017-01-17 NOTE — Interval H&P Note (Signed)
History and Physical Interval Note:  01/17/2017 3:55 PM  Jeremy PaganiniJames Gorum  has presented today for surgery, with the diagnosis of asd  The various methods of treatment have been discussed with the patient and family. After consideration of risks, benefits and other options for treatment, the patient has consented to  Procedure(s): Atrial Septal Defect(ASD) Closure (N/A) as a surgical intervention .  The patient's history has been reviewed, patient examined, no change in status, stable for surgery.  I have reviewed the patient's chart and labs.  Questions were answered to the patient's satisfaction.     Tonny Bollmanooper, Kendrah Lovern

## 2017-01-17 NOTE — Progress Notes (Signed)
Patient ID: Jeremy PaganiniJames Herman, male   DOB: 04/23/1946, 71 y.o.   MRN: [id number]   SUBJECTIVE:  Overall feeling ok. Denies SOB. Has had trouble with orthostasis, now on midodrine.  Building up more peripheral edema.    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   Scheduled Meds: . atorvastatin  20 mg Oral q1800  . ceFAZolin  1 g Intravenous To Cath  . enoxaparin (LOVENOX) injection  40 mg Subcutaneous Daily  . feeding supplement (ENSURE ENLIVE)  237 mL Oral BID BM  . folic acid  1 mg Oral Daily  . furosemide  40 mg Oral Daily  . midodrine  10 mg Oral TID WC  . sodium chloride flush  3 mL Intravenous Q12H  . sodium chloride flush  3 mL Intravenous Q12H  . sodium chloride flush  3 mL Intravenous Q12H  . spironolactone  200 mg Oral Daily   Continuous Infusions: . sodium chloride 10 mL/hr at 01/17/17 0517   PRN Meds:.sodium chloride, sodium chloride, acetaminophen, ondansetron (ZOFRAN) IV, senna-docusate, sodium chloride flush, sodium chloride flush    Vitals:   01/16/17 1445 01/16/17 2003 01/17/17 0438 01/17/17 0538  BP: 94/61 (!) 97/56 97/62   Pulse: (!) 111 (!) 121 (!) 102   Resp: 20 20 20    Temp: 98.4 F (36.9 C) 99.5 F (37.5 C) 98.2 F (36.8 C)   TempSrc: Oral Oral Oral   SpO2: 99% 94% 98%   Weight:    160 lb 1.6 oz (72.6 kg)  Height:        Intake/Output Summary (Last 24 hours) at 01/17/17 0850 Last data filed at 01/16/17 2210  Gross per 24 hour  Intake              480 ml  Output              100 ml  Net              380 ml    LABS: Basic Metabolic Panel:  Recent Labs  40/98/1102/19/18 0229 01/17/17 0310  NA 136 134*  K 3.7 4.2  CL 104 103  CO2 23 24    GLUCOSE 117* 115*  BUN 10 11  CREATININE 0.63 0.57*  CALCIUM 8.0* 8.2*   Liver Function Tests: No results for input(s): AST, ALT, ALKPHOS, BILITOT, PROT, ALBUMIN in the last 72 hours. No results for input(s): LIPASE, AMYLASE in the last 72 hours. CBC:  Recent Labs  01/16/17 0229 01/17/17 0310  WBC 12.5* 11.5*  HGB 9.9* 9.5*  HCT 28.2* 27.2*  MCV 122.1* 122.0*  PLT 179 183   Cardiac Enzymes: No results for input(s): CKTOTAL, CKMB, CKMBINDEX, TROPONINI in the last 72 hours. BNP: Invalid input(s): POCBNP D-Dimer: No results for input(s): DDIMER in the last 72 hours. Hemoglobin A1C: No results for input(s): HGBA1C in the last 72 hours. Fasting Lipid Panel: No results for input(s): CHOL, HDL, LDLCALC, TRIG, CHOLHDL, LDLDIRECT in the last 72 hours. Thyroid Function Tests: No results for input(s): TSH, T4TOTAL, T3FREE, THYROIDAB in the last 72 hours.  Invalid input(s): FREET3 Anemia Panel: No results for input(s): VITAMINB12, FOLATE, FERRITIN, TIBC, IRON, RETICCTPCT in the last  72 hours.  RADIOLOGY: Ct Abdomen Pelvis W Wo Contrast  Result Date: 01/12/2017 CLINICAL DATA:  Evaluate complex cyst identified on recent ultrasound. EXAM: CT ABDOMEN AND PELVIS WITHOUT AND WITH CONTRAST TECHNIQUE: Multidetector CT imaging of the abdomen and pelvis was performed following the standard protocol before and following the bolus administration of intravenous contrast. CONTRAST:  ISOVUE-300 IOPAMIDOL (ISOVUE-300) INJECTION 61% COMPARISON:  None FINDINGS: Lower chest: No acute abnormality. Hepatobiliary: Hepatic steatosis. There is a diffuse changes of cirrhosis involving the liver. No enhancing liver abnormalities identified to suggest hepatoma. The portal vein remains patent. Unremarkable appearance of the gallbladder. Pancreas: Normal appearance of the pancreas. Spleen: Normal in size without focal abnormality. Adrenals/Urinary Tract: Bilateral kidney lesions are identified. The complex  midpole lesion arising from the lateral aspect of the right kidney is identified measuring 2.3 cm and 14 HU precontrast, image 78 of series 2. There is no significant enhancement within this lesion following the IV administration of contrast material with postcontrast Hounsfield units equal to 18.39. Hyperdense precontrast lesion arising from the mid left kidney measures 1.3 cm and 70 HU precontrast, image 64 of series 2. Postcontrast Hounsfield units equal 65.88. No internal septation or enhancing nodularity identified. No hydronephrosis identified. The urinary bladder appears within normal limits. Stomach/Bowel: The stomach is normal. The small bowel loops have a normal course and caliber. No bowel obstruction. Unremarkable appearance of the colon. Vascular/Lymphatic: Aortic atherosclerosis. No aneurysm. No upper abdominal or pelvic adenopathy. No inguinal adenopathy noted. Reproductive: Prostate is unremarkable. Other: Moderate to large volume ascites identified within the abdomen and pelvis. Musculoskeletal: No acute or significant osseous findings. IMPRESSION: 1. 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 2. Morphologic features a liver compatible with cirrhosis. 3. Ascites. 4. Aortic atherosclerosis. Electronically Signed   By: Signa Kell M.D.   On: 01/12/2017 13:22   X-ray Chest Pa And Lateral  Result Date: 01/11/2017 CLINICAL DATA:  Shortness of breath for 4 weeks EXAM: CHEST  2 VIEW COMPARISON:  01/10/2017 FINDINGS: Cardiomediastinal silhouette is stable. No infiltrate or pleural effusion. No pulmonary edema. Again noted degenerative changes thoracic spine. IMPRESSION: No active cardiopulmonary disease. Electronically Signed   By: Natasha Mead M.D.   On: 01/11/2017 11:21   Ct Angio Chest Pe W Or Wo Contrast  Result Date: 01/10/2017 CLINICAL DATA:  Bilateral leg swelling, shortness of breath. EXAM: CT ANGIOGRAPHY CHEST WITH CONTRAST TECHNIQUE: Multidetector CT  imaging of the chest was performed using the standard protocol during bolus administration of intravenous contrast. Multiplanar CT image reconstructions and MIPs were obtained to evaluate the vascular anatomy. CONTRAST:  70 cc Isovue 370 IV COMPARISON:  07/08/2014 FINDINGS: Cardiovascular: Improving clot burden within the pulmonary arteries. There is eccentric chronic clot within the left lower lobe segmental pulmonary artery, best seen on image 51 of series 4. Minimal residual filling defect noted within the right lower lobe pulmonary arterial branch on image 55. Note new areas of thromboembolism. Heart is normal size. Aorta is normal caliber. Extensive diffuse coronary artery calcifications and scattered aortic calcifications. Mediastinum/Nodes: No mediastinal, hilar, or axillary adenopathy. Lungs/Pleura: Nodular area in the right upper lobe on image 41 is stable since prior study and compatible with scarring. Scarring in the right lower lobe, stable. No new airspace opacities or effusions. Upper Abdomen: Severe diffuse low-density throughout the liver compatible with fatty infiltration. Ascites noted adjacent to the liver and spleen. Musculoskeletal: Chest wall soft tissues are unremarkable. No acute bony abnormality. Review of the  MIP images confirms the above findings. IMPRESSION: Partial resolution of the previously seen chronic pulmonary emboli. Mild residual eccentric chronic emboli noted in both lower lobes. No new/ acute pulmonary emboli. Advanced coronary artery disease. Severe low-density throughout the liver, likely fatty infiltration. Upper abdominal ascites adjacent to the liver and spleen. Electronically Signed   By: Charlett Nose M.D.   On: 01/10/2017 16:47   US Abdomen Complete  Result Date: 01/10/2017 CLINICAL DATA:  Abdominal swelling for 3 weeks EXAM: ABDOMEN ULTRASOUND COMPLETE COMPARISON:  None. FINDINGS: Gallbladder: No gallstones are noted within gallbladder. There is some layering  gallbladder sludge. No thickening of gallbladder wall. No sonographic Murphy's sign. Common bile duct: Diameter: 3.5 mm in diameter within normal limits. Liver: No focal hepatic mass. There is diffuse increased echogenicity of the liver suspicious for fatty infiltration or chronic liver disease. IVC: Not well seen due to bowel gas and abdominal ascites. Pancreas: Not well seen due to abdominal ascites. Spleen: Size and appearance within normal limits. Measures 7 cm in length. Right Kidney: Length: 13.5 cm in length. No hydronephrosis or renal calculi. There is a complex possible cystic hypoechoic exophytic lesion in midpole of the left kidney measures 2.2 x 2.2 cm further correlation with enhanced CT or MRI is recommended to exclude a complex enhancing lesion. Left Kidney: Length: 12.7 cm. No hydronephrosis or renal calculi. A cyst in midpole measures 1.8 x 1.2 cm. Abdominal aorta: No aneurysm visualized. Measures up to 2.4 cm in diameter. Other findings: Abdominal ascites is noted. IMPRESSION: 1. No gallstones are noted within gallbladder. Small layering gallbladder sludge. Normal CBD. No sonographic Murphy's sign. 2. Heterogeneous increased echogenicity of the liver which may be due to fatty infiltration or chronic liver disease. 3. No hydronephrosis. There is a complex exophytic lesion midpole of the right kidney measures 2.2 x 2.2 cm. Further evaluation with enhanced CT or MRI is recommended to exclude complex cystic neoplasm. 4. No left hydronephrosis. A cyst in midpole of the left kidney measures 1.8 x 1.2 cm. 5. No aortic aneurysm.  Abdominal ascites is noted. Electronically Signed   By: Natasha Mead M.D.   On: 01/10/2017 16:06   US Paracentesis  Result Date: 01/11/2017 INDICATION: Ascites of unknown etiology. Request is made for diagnostic and therapeutic paracentesis. EXAM: ULTRASOUND GUIDED DIAGNOSTIC AND THERAPEUTIC PARACENTESIS MEDICATIONS: 1% lidocaine COMPLICATIONS: None immediate. PROCEDURE:  Informed written consent was obtained from the patient after a discussion of the risks, benefits and alternatives to treatment. A timeout was performed prior to the initiation of the procedure. Initial ultrasound scanning demonstrates a moderate amount of ascites within the right lower abdominal quadrant. The right lower abdomen was prepped and draped in the usual sterile fashion. 1% lidocaine was used for local anesthesia. Following this, a 19 gauge, 7-cm, Yueh catheter was introduced. An ultrasound image was saved for documentation purposes. The paracentesis was performed. The catheter was removed and a dressing was applied. The patient tolerated the procedure well without immediate post procedural complication. FINDINGS: A total of approximately 2.7 L of yellow fluid was removed. Samples were sent to the laboratory as requested by the clinical team. IMPRESSION: Successful ultrasound-guided paracentesis yielding 2.7 liters of peritoneal fluid. Read by: Barnetta Chapel, PA-C Electronically Signed   By: Judie Petit.  Shick M.D.   On: 01/11/2017 11:38   Dg Chest Port 1 View  Result Date: 01/10/2017 CLINICAL DATA:  Shortness of Breath EXAM: PORTABLE CHEST 1 VIEW COMPARISON:  04/20/2016 FINDINGS: Heart and mediastinal contours are within normal limits.  No focal opacities or effusions. No acute bony abnormality. IMPRESSION: No active disease. Electronically Signed   By: Charlett Nose M.D.   On: 01/10/2017 10:53    PHYSICAL EXAM General: NAD in bed.  Neck: JVP 8 cm, no thyromegaly or thyroid nodule.  Lungs: CTAB.  CV: Nondisplaced PMI.  Heart regular S1/S2, no S3/S4, no murmur.  R and LLE 1-2 +  edema at ankles.   Abdomen: Soft, nontender, no hepatosplenomegaly, mildly distended. .  Neurologic: Alert and oriented x 3.  Psych: Normal affect. Extremities: No clubbing or cyanosis. R and LLE with multiple varicosities.   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  Found to have ascites and evidence for RV dysfunction by echo as well as at least moderate pulmonary hypertension.  1. RV failure/pulmonary hypertension: Echo 2/18 with EF 60-65%, moderately dilated RV with normal systolic function, PASP 57, mild TR, no mention made of ASD.  TEE showed RV dilation and secundum ASD measuring 1.5 cm.  RHC/LHC -->L to R shunt at atrial level with ASD, Qp/Qs 1.8/1.  Severe pulmonary hypertension by echo but only mild PH on RHC.  Suspect PH is due to a combination of ASD and chronic thromboembolic disease.  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  - Continue spironolactone at current dose.  - Would add Lasix 40 mg daily with mild JVD and peripheral edema.   - Would not treat PH with selective pulmonary vasodilators at this point, mild and PVR not significantly elevated. Treatment for now will be ASD closure.  2. 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  Never had outpatient cardiology followup. TEE/Cath with significant ASD and left to right shunt as above.  - Dr Excell Seltzer consulted for percutaneous closure => plan for this afternoon.  3. Ascites/cirrhosis: Possible hepatic congestion from RV failure versus ETOH cirrhosis. Imaging showed fatty liver and possible cirrhosis.  HCV negative, no ETOH x 16 years. S/P paracentesis 2/14 with 2.7 liters removed.  - Now on increased spironolactone to try to limit ascites.  4. 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  Continue anticoagulation => held for ASD closure, restart warfarin afterwards. 5. Atrial fibrillation: Paroxysmal.  He is currently in NSR. INR per pharmacy. Continue coumadin after procedure.  Would stop diltiazem at this point as he is in NSR and has orthostatic hypotension requiring  midodrine.  6. CAD: Subtotal occlusion PDA with collaterals from left. Medical management, added statin. No ASA as will be on warfarin.  7. Orthostatic hypotension: Low BP may be due in part to cirrhosis.  Now that he is on midodrine, would stop diltiazem CD as above.   Marca Ancona 01/17/2017 8:50 AM

## 2017-01-17 NOTE — Consult Note (Signed)
[facility] CM Primary Care Navigator  01/17/2017  Jeremy Herman May 08, 1946 [id number]  Met with patient at the bedside to identify possible discharge needs. Patient reports feeling lightheaded, dizzy and having swelling to abdomen that had led to this admission/surgery.  Patient endorses Dr. Boykin Nearing with [facility] as the primary care provider.    Patient shared using [place] [facility] to obtain medications without any problem.   He verbalized managing his own medications at home straight out of the containers.   Patient is able to drive prior to this admission, however, his friend Harrie Jeans) or neighbors Butch Penny and [place]) can provide transportation to his doctors' appointments as needed.  Friend and neighbors will serve as his primary caregivers at home when needed.   Patient is hoping to be discharged home when stable.  Patient voiced understanding to call primary care provider's office when he returns home, for a post discharge follow-up appointment within a week or sooner if needs arise. Patient letter (with PCP's contact number) was provided as his reminder.  Patient denies any health management needs or concerns at this time.  For additional questions please contact:  Edwena Felty A. Faustina Gebert, BSN, RN-BC [facility] PRIMARY CARE Navigator Cell: [phone]

## 2017-01-17 NOTE — H&P (View-Only) (Signed)
CARDIOLOGY CONSULT NOTE  Patient ID: Jeremy Herman, MRN: [id number], DOB/AGE: Apr 07, 1946 71 y.o. 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 The patient was subsequently lost to follow-up but percutaneous ASD closure was recommended at that time. The patient presents this hospitalization admission with generalized weakness, shortness of breath, and leg swelling. He was found to have significant ascites requiring paracentesis. He was evaluated by Dr. Shirlee LatchMcLean and underwent right and left heart catheterization as well as TEE. The studies are redemonstrated a large secundum ASD with QP: QS 1.8:1. The patient had incidentally noted coronary artery disease with subtotal occlusion of his right PDA branch and left to right collaterals. He has evidence of severe pulmonary hypertension estimated by echo, but invasive evaluation demonstrated only mild pulmonary hypertension. There is mild to moderate RV dilatation and dysfunction.  The patient is feeling better with current treatment including diuretic therapy. He has been restarted on warfarin and his INR is currently 1.3. 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 He denies chest pain or pressure, orthopnea, or PND. He complains of generalized weakness. He's had problems with orthostasis and has been started on midodrine. He has not had frank syncope.   Medical History:  Past Medical History:  Diagnosis Date  . Arthritis   . Chronic anticoagulation   . DVT (deep venous thrombosis) (HCC)   . Medical history non-contributory   . PE (pulmonary embolism)       Surgical History:  Past  Surgical History:  Procedure Laterality Date  . ANKLE SURGERY  1979   right ankle  . FEMUR IM NAIL Left 04/21/2016   Procedure: INTRAMEDULLARY (IM) NAIL LEFT  FEMORAL;  Surgeon: Samson FredericBrian Swinteck, MD;  Location: [facility];  Service: Orthopedics;  Laterality: Left;  . RIGHT/LEFT HEART CATH AND CORONARY ANGIOGRAPHY N/A 01/13/2017   Procedure: Right/Left Heart Cath and Coronary Angiography;  Surgeon: Laurey Moralealton S McLean, MD;  [facility];  Service: Cardiovascular;  Laterality: N/A;  . TEE WITHOUT CARDIOVERSION N/A 01/13/2017   Procedure: TRANSESOPHAGEAL ECHOCARDIOGRAM (TEE);  Surgeon: Laurey Moralealton S McLean, MD;  [facility];  Service: Cardiovascular;  Laterality: N/A;     Home Meds: Prior to Admission medications   Medication Sig Start Date End Date Taking? Authorizing Provider  diltiazem (CARDIZEM CD) 180 MG 24 hr capsule Take 1 capsule (180 mg total) by mouth daily. 12/09/16  Yes Josalyn Funches, MD  traMADol (ULTRAM) 50 MG tablet Take 1 tablet (50 mg total) by mouth every 12 (twelve) hours as needed. 12/09/16  Yes Dessa PhiJosalyn Funches, MD  warfarin (COUMADIN) 2 MG tablet Take 1 tablet by mouth every day but 2 tablets on Tuesdays and Saturdays. Patient taking differently: Take 1-2 mg by mouth See admin instructions. 1mg  On Tuesday, Thursday, Saturday - pt takes 2mg  on Sunday, Monday, Wednesday, Friday 12/09/16  Yes Dessa PhiJosalyn Funches, MD  folic acid (FOLVITE) 1 MG tablet Take 1 tablet (1 mg total) by mouth daily. Patient not taking: Reported on 01/10/2017 12/13/16   Dessa PhiJosalyn Funches, MD  Multiple Vitamins-Minerals (MULTIVITAMIN) tablet Take 1 tablet by mouth daily. Patient not taking: Reported on 01/10/2017 12/13/16   Dessa PhiJosalyn Funches, MD  thiamine (VITAMIN B-1) 100 MG tablet Take 1  tablet (100 mg total) by mouth daily. Patient not taking: Reported on 01/10/2017 12/13/16   Dessa Phi, MD    Inpatient Medications:  . atorvastatin  20 mg Oral q1800  . diltiazem  120 mg Oral Daily  . feeding  supplement (ENSURE ENLIVE)  237 mL Oral BID BM  . folic acid  1 mg Oral Daily  . midodrine  10 mg Oral TID WC  . sodium chloride flush  3 mL Intravenous Q12H  . sodium chloride flush  3 mL Intravenous Q12H  . [START ON 01/17/2017] spironolactone  200 mg Oral Daily  . warfarin  4 mg Oral ONCE-1800  . Warfarin - Pharmacist Dosing Inpatient   Does not apply q1800     Allergies:  Allergies  Allergen Reactions  . Fish Allergy Anaphylaxis and Other (See Comments)    Diarrhea     Social History   Social History  . Marital status: Married    Spouse name: N/A  . Number of children: N/A  . Years of education: N/A   Occupational History  . Not on file.   Social History Main Topics  . Smoking status: Current Every Day Smoker    Packs/day: 1.00    Years: 43.00    Types: Cigarettes  . Smokeless tobacco: Never Used     Comment: Quit 3 days ago  . Alcohol use No     Comment: Former extensive use; Quit ~16 years ago  . Drug use: No  . Sexual activity: Not Currently   Other Topics Concern  . Not on file   Social History Narrative  . No narrative on file     Family History  Problem Relation Age of Onset  . Stomach cancer Father   . Cancer Mother   . Aneurysm Mother   . Deep vein thrombosis Brother 67     Review of Systems: General: negative for chills, fever, night sweats or weight changes. Positive for generalized weakness. ENT: negative for rhinorrhea or epistaxis Cardiovascular: no chest pain Dermatological: negative for rash Respiratory: negative for cough or wheezing, positive for shortness of breath GI: negative for nausea, vomiting, diarrhea, bright red blood per rectum, melena, or hematemesis GU: no hematuria, urgency, or frequency Neurologic: negative for visual changes, syncope, headache. Positive for dizziness. Heme: no easy bruising or bleeding Endo: negative for excessive thirst, thyroid disorder, or flushing Musculoskeletal: negative for joint pain or  swelling, negative for myalgias  All other systems reviewed and are otherwise negative except as noted above.  Physical Exam: Blood pressure (!) 90/54, pulse (!) 106, temperature 98.2 F (36.8 C), temperature source Oral, resp. rate 18, height 6' (1.829 m), weight 158 lb 9.6 oz (71.9 kg), SpO2 96 %. Pt is alert and oriented, NAD HEENT: normal Neck: JVP normal. Carotid upstrokes normal without bruits. No thyromegaly. Lungs: equal expansion, clear bilaterally CV: Apex is discrete and nondisplaced, RRR without murmur or gallop Abd: soft, moderately distended Back: no CVA tenderness Ext: trace pretibial edema bilaterally        DP/PT pulses intact and = Skin: warm and dry without rash Neuro: CNII-XII intact             Strength intact = bilaterally    Labs: No results for input(s): CKTOTAL, CKMB, TROPONINI in the last 72 hours. Lab Results  Component Value Date   WBC 12.5 (H) 01/16/2017   HGB 9.9 (L) 01/16/2017   HCT 28.2 (L) 01/16/2017   MCV 122.1 (H) 01/16/2017   PLT 179  01/16/2017    Recent Labs Lab 01/13/17 0231  01/16/17 0229  NA 132*  < > 136  K 3.3*  < > 3.7  CL 100*  < > 104  CO2 25  < > 23  BUN 8  < > 10  CREATININE 0.56*  < > 0.63  CALCIUM 7.9*  < > 8.0*  PROT 5.6*  --   --   BILITOT 1.7*  --   --   ALKPHOS 86  --   --   ALT 58  --   --   AST 109*  --   --   GLUCOSE 123*  < > 117*  < > = values in this interval not displayed. Lab Results  Component Value Date   CHOL  12/24/2010    128        ATP III CLASSIFICATION:  <200     mg/dL   Desirable  409-811  mg/dL   Borderline High  >=914    mg/dL   High          HDL 25 (L) 12/24/2010   LDLCALC  12/24/2010    77        Total Cholesterol/HDL:CHD Risk Coronary Heart Disease Risk Table                     Men   Women  1/2 Average Risk   3.4   3.3  Average Risk       5.0   4.4  2 X Average Risk   9.6   7.1  3 X Average Risk  23.4   11.0        Use the calculated Patient Ratio above and the CHD Risk  Table to determine the patient's CHD Risk.        ATP III CLASSIFICATION (LDL):  <100     mg/dL   Optimal  782-956  mg/dL   Near or Above                    Optimal  130-159  mg/dL   Borderline  213-086  mg/dL   High  >578     mg/dL   Very High   TRIG 469 12/24/2010   No results found for: DDIMER  Radiology/Studies:  Ct Abdomen Pelvis W Wo Contrast  Result Date: 01/12/2017 CLINICAL DATA:  Evaluate complex cyst identified on recent ultrasound. EXAM: CT ABDOMEN AND PELVIS WITHOUT AND WITH CONTRAST TECHNIQUE: Multidetector CT imaging of the abdomen and pelvis was performed following the standard protocol before and following the bolus administration of intravenous contrast. CONTRAST:  ISOVUE-300 IOPAMIDOL (ISOVUE-300) INJECTION 61% COMPARISON:  None FINDINGS: Lower chest: No acute abnormality. Hepatobiliary: Hepatic steatosis. There is a diffuse changes of cirrhosis involving the liver. No enhancing liver abnormalities identified to suggest hepatoma. The portal vein remains patent. Unremarkable appearance of the gallbladder. Pancreas: Normal appearance of the pancreas. Spleen: Normal in size without focal abnormality. Adrenals/Urinary Tract: Bilateral kidney lesions are identified. The complex midpole lesion arising from the lateral aspect of the right kidney is identified measuring 2.3 cm and 14 HU precontrast, image 78 of series 2. There is no significant enhancement within this lesion following the IV administration of contrast material with postcontrast Hounsfield units equal to 18.39. Hyperdense precontrast lesion arising from the mid left kidney measures 1.3 cm and 70 HU precontrast, image 64 of series 2. Postcontrast Hounsfield units equal 65.88. No internal septation or enhancing nodularity identified. No hydronephrosis  identified. The urinary bladder appears within normal limits. Stomach/Bowel: The stomach is normal. The small bowel loops have a normal course and caliber. No bowel  obstruction. Unremarkable appearance of the colon. Vascular/Lymphatic: Aortic atherosclerosis. No aneurysm. No upper abdominal or pelvic adenopathy. No inguinal adenopathy noted. Reproductive: Prostate is unremarkable. Other: Moderate to large volume ascites identified within the abdomen and pelvis. Musculoskeletal: No acute or significant osseous findings. IMPRESSION: 1. 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 2. Morphologic features a liver compatible with cirrhosis. 3. Ascites. 4. Aortic atherosclerosis. Electronically Signed   By: Signa Kell M.D.   On: 01/12/2017 13:22   X-ray Chest Pa And Lateral  Result Date: 01/11/2017 CLINICAL DATA:  Shortness of breath for 4 weeks EXAM: CHEST  2 VIEW COMPARISON:  01/10/2017 FINDINGS: Cardiomediastinal silhouette is stable. No infiltrate or pleural effusion. No pulmonary edema. Again noted degenerative changes thoracic spine. IMPRESSION: No active cardiopulmonary disease. Electronically Signed   By: Natasha Mead M.D.   On: 01/11/2017 11:21   Ct Angio Chest Pe W Or Wo Contrast  Result Date: 01/10/2017 CLINICAL DATA:  Bilateral leg swelling, shortness of breath. EXAM: CT ANGIOGRAPHY CHEST WITH CONTRAST TECHNIQUE: Multidetector CT imaging of the chest was performed using the standard protocol during bolus administration of intravenous contrast. Multiplanar CT image reconstructions and MIPs were obtained to evaluate the vascular anatomy. CONTRAST:  70 cc Isovue 370 IV COMPARISON:  07/08/2014 FINDINGS: Cardiovascular: Improving clot burden within the pulmonary arteries. There is eccentric chronic clot within the left lower lobe segmental pulmonary artery, best seen on image 51 of series 4. Minimal residual filling defect noted within the right lower lobe pulmonary arterial branch on image 55. Note new areas of thromboembolism. Heart is normal size. Aorta is normal caliber. Extensive diffuse coronary artery calcifications and  scattered aortic calcifications. Mediastinum/Nodes: No mediastinal, hilar, or axillary adenopathy. Lungs/Pleura: Nodular area in the right upper lobe on image 41 is stable since prior study and compatible with scarring. Scarring in the right lower lobe, stable. No new airspace opacities or effusions. Upper Abdomen: Severe diffuse low-density throughout the liver compatible with fatty infiltration. Ascites noted adjacent to the liver and spleen. Musculoskeletal: Chest wall soft tissues are unremarkable. No acute bony abnormality. Review of the MIP images confirms the above findings. IMPRESSION: Partial resolution of the previously seen chronic pulmonary emboli. Mild residual eccentric chronic emboli noted in both lower lobes. No new/ acute pulmonary emboli. Advanced coronary artery disease. Severe low-density throughout the liver, likely fatty infiltration. Upper abdominal ascites adjacent to the liver and spleen. Electronically Signed   By: Charlett Nose M.D.   On: 01/10/2017 16:47   US Abdomen Complete  Result Date: 01/10/2017 CLINICAL DATA:  Abdominal swelling for 3 weeks EXAM: ABDOMEN ULTRASOUND COMPLETE COMPARISON:  None. FINDINGS: Gallbladder: No gallstones are noted within gallbladder. There is some layering gallbladder sludge. No thickening of gallbladder wall. No sonographic Murphy's sign. Common bile duct: Diameter: 3.5 mm in diameter within normal limits. Liver: No focal hepatic mass. There is diffuse increased echogenicity of the liver suspicious for fatty infiltration or chronic liver disease. IVC: Not well seen due to bowel gas and abdominal ascites. Pancreas: Not well seen due to abdominal ascites. Spleen: Size and appearance within normal limits. Measures 7 cm in length. Right Kidney: Length: 13.5 cm in length. No hydronephrosis or renal calculi. There is a complex possible cystic hypoechoic exophytic lesion in midpole of the left kidney measures 2.2 x 2.2 cm further  correlation with enhanced CT or  MRI is recommended to exclude a complex enhancing lesion. Left Kidney: Length: 12.7 cm. No hydronephrosis or renal calculi. A cyst in midpole measures 1.8 x 1.2 cm. Abdominal aorta: No aneurysm visualized. Measures up to 2.4 cm in diameter. Other findings: Abdominal ascites is noted. IMPRESSION: 1. No gallstones are noted within gallbladder. Small layering gallbladder sludge. Normal CBD. No sonographic Murphy's sign. 2. Heterogeneous increased echogenicity of the liver which may be due to fatty infiltration or chronic liver disease. 3. No hydronephrosis. There is a complex exophytic lesion midpole of the right kidney measures 2.2 x 2.2 cm. Further evaluation with enhanced CT or MRI is recommended to exclude complex cystic neoplasm. 4. No left hydronephrosis. A cyst in midpole of the left kidney measures 1.8 x 1.2 cm. 5. No aortic aneurysm.  Abdominal ascites is noted. Electronically Signed   By: Natasha Mead M.D.   On: 01/10/2017 16:06   US Paracentesis  Result Date: 01/11/2017 INDICATION: Ascites of unknown etiology. Request is made for diagnostic and therapeutic paracentesis. EXAM: ULTRASOUND GUIDED DIAGNOSTIC AND THERAPEUTIC PARACENTESIS MEDICATIONS: 1% lidocaine COMPLICATIONS: None immediate. PROCEDURE: Informed written consent was obtained from the patient after a discussion of the risks, benefits and alternatives to treatment. A timeout was performed prior to the initiation of the procedure. Initial ultrasound scanning demonstrates a moderate amount of ascites within the right lower abdominal quadrant. The right lower abdomen was prepped and draped in the usual sterile fashion. 1% lidocaine was used for local anesthesia. Following this, a 19 gauge, 7-cm, Yueh catheter was introduced. An ultrasound image was saved for documentation purposes. The paracentesis was performed. The catheter was removed and a dressing was applied. The patient tolerated the procedure well without immediate post procedural  complication. FINDINGS: A total of approximately 2.7 L of yellow fluid was removed. Samples were sent to the laboratory as requested by the clinical team. IMPRESSION: Successful ultrasound-guided paracentesis yielding 2.7 liters of peritoneal fluid. Read by: Barnetta Chapel, PA-C Electronically Signed   By: Judie Petit.  Shick M.D.   On: 01/11/2017 11:38   Dg Chest Port 1 View  Result Date: 01/10/2017 CLINICAL DATA:  Shortness of Breath EXAM: PORTABLE CHEST 1 VIEW COMPARISON:  04/20/2016 FINDINGS: Heart and mediastinal contours are within normal limits. No focal opacities or effusions. No acute bony abnormality. IMPRESSION: No active disease. Electronically Signed   By: Charlett Nose M.D.   On: 01/10/2017 10:53   Cardiac Studies: 2D Echo: Left ventricle:  The cavity size was normal. Systolic function was normal. The estimated ejection fraction was in the range of 60% to 65%. Wall motion was normal; there were no regional wall motion abnormalities. The transmitral flow pattern was normal. The deceleration time of the early transmitral flow velocity was normal. The pulmonary vein flow pattern was normal. The tissue Doppler parameters were normal. Left ventricular diastolic function parameters were normal.  ------------------------------------------------------------------- Aortic valve:   Trileaflet; normal thickness leaflets. Mobility was not restricted.  Doppler:  Transvalvular velocity was within the normal range. There was no stenosis. There was no regurgitation.   ------------------------------------------------------------------- Aorta:  Aortic root: The aortic root was normal in size.  ------------------------------------------------------------------- Mitral valve:   Structurally normal valve.   Mobility was not restricted.  Doppler:  Transvalvular velocity was within the normal range. There was no evidence for stenosis. There was  trivial regurgitation.  ------------------------------------------------------------------- Left atrium:  The atrium was normal in size.  ------------------------------------------------------------------- Right ventricle:  The cavity size was moderately dilated.  Wall thickness was normal. Systolic function was normal.  ------------------------------------------------------------------- Pulmonic valve:    Structurally normal valve.   Cusp separation was normal.  Doppler:  Transvalvular velocity was within the normal range. There was no evidence for stenosis. There was no regurgitation.  ------------------------------------------------------------------- Tricuspid valve:   Structurally normal valve.    Doppler: Transvalvular velocity was within the normal range. There was mild regurgitation.  ------------------------------------------------------------------- Pulmonary artery:   The main pulmonary artery was normal-sized. Systolic pressure was moderately to severely increased.  ------------------------------------------------------------------- Right atrium:  The atrium was moderately dilated.  ------------------------------------------------------------------- Pericardium:  There was no pericardial effusion.  ------------------------------------------------------------------- Systemic veins: Inferior vena cava: The vessel was normal in size. The respirophasic diameter changes were in the normal range (>= 50%), consistent with normal central venous pressure.  Cardiac Cath 01-13-2017: Procedures   Right/Left Heart Cath and Coronary Angiography  Conclusion   1. There is a significant left to right shunt at the atrial level, consistent with ASD.  2. Right and left heart filling pressures are not elevated.  3. Mild pulmonary hypertension, PVR is not significantly elevated.  4. Subtotal occlusion of the PDA with left to right collaterals.   I think that he needs the  ASD closed.  Dr. Excell Seltzer, who performs this procedure, is not here this week.  If he remains in the hospital on Monday, I will ask Dr Excell Seltzer to see him.  If not, he can followup as an outpatient.   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    Surprisingly, pulmonary hypertension is only mild.     TEE: Findings: Normal LV size with EF 55-60%.  Normal wall thickness.  No regional WMAs.  The right ventricle was mildly dilated with mildly decreased systolic function.  The right atrium was mildly dilated.  The left atrium was mildly dilated.  There was no LA appendage thrombus.  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  Moderate TR, peak RV-RA gradient 73 mmHg.  Mild mitral regurgitation.  Trileaflet aortic valve with no stenosis or regurgitation.  Normal caliber aorta with grade III-IV plaque in descending thoracic aorta.   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   ASSESSMENT AND PLAN:  Complex patient with evidence of chronic liver disease and ascites who also has pulmonary hypertension and a large ostium secundum ASD. I have personally reviewed his echo images which demonstrate RV/RA dilatation, normal LV function, and an atrial septal defect that measures 15 mm in diameter. The patient's cardiac catheterization data demonstrates mild pulmonary hypertension with a mean PA pressure of 26 mmHg. There is significant left to right shunt based on a QP: QS ratio of 1.8:1. Right-sided cardiac chamber dilatation also is a confirmatory finding for significant right to left shunt. I agree that ASD closure is clearly indicated. I think the patient has anatomic characteristics that will allow for percutaneous ASD closure. I have reviewed the potential risks, indications, and alternatives to percutaneous ASD closure with the patient. He understands the risks include stroke,  myocardial infarction, cardiac perforation, cardiac tamponade, arrhythmia such as atrial fibrillation, device embolization, and late device erosion. He understands the risk of emergency cardiac surgery is extremely low. Considering the patient's circumstances with need for long-term anticoagulation, currently with a subtherapeutic INR, I think it is best to go ahead and do his procedure well he is an inpatient. This will be scheduled for  tomorrow afternoon. Will hold warfarin today and start him back on warfarin tomorrow evening as long as his procedure is uncomplicated. All questions are answered. He should be eligible for hospital discharge on Wednesday as long as no complications arise. Would plan to treat him with warfarin and low-dose aspirin until his INR is therapeutic.  Jeremy Bi MD, [facility] 01/16/2017, 12:58 PM

## 2017-01-17 NOTE — Progress Notes (Addendum)
Site area: Right groin a 8 and 9 french venous sheath was removed  Site Prior to Removal:  Level 0  Pressure Applied For 20 MINUTES    Bedrest Beginning at 1845pm  Manual:   Yes.    Patient Status During Pull:  stable  Post Pull Groin Site:  Level 0  Post Pull Instructions Given:  Yes.    Post Pull Pulses Present:  Yes.    Dressing Applied:  Yes.  Pressure dressing applied  Comments:  VS remain stable during sheath pull

## 2017-01-18 ENCOUNTER — Inpatient Hospital Stay (HOSPITAL_COMMUNITY): Payer: Medicare Other

## 2017-01-18 ENCOUNTER — Encounter (HOSPITAL_COMMUNITY): Payer: Self-pay | Admitting: Cardiovascular Disease

## 2017-01-18 DIAGNOSIS — I50810 Right heart failure, unspecified: Secondary | ICD-10-CM

## 2017-01-18 DIAGNOSIS — K7031 Alcoholic cirrhosis of liver with ascites: Secondary | ICD-10-CM

## 2017-01-18 DIAGNOSIS — I2510 Atherosclerotic heart disease of native coronary artery without angina pectoris: Secondary | ICD-10-CM

## 2017-01-18 DIAGNOSIS — I270 Primary pulmonary hypertension: Secondary | ICD-10-CM

## 2017-01-18 DIAGNOSIS — E440 Moderate protein-calorie malnutrition: Secondary | ICD-10-CM

## 2017-01-18 LAB — METHYLMALONIC ACID(MMA), RND URINE
Creatinine(Crt), U: 0.92 g/L (ref 0.30–3.00)
MMA - NORMALIZED: 2.4 (ref 0.4–2.5)
Methylmalonic Acid, Ur: 19.8 umol/L (ref 1.6–29.7)

## 2017-01-18 LAB — BASIC METABOLIC PANEL
ANION GAP: 8 (ref 5–15)
BUN: 10 mg/dL (ref 6–20)
CHLORIDE: 101 mmol/L (ref 101–111)
CO2: 25 mmol/L (ref 22–32)
CREATININE: 0.58 mg/dL — AB (ref 0.61–1.24)
Calcium: 8.1 mg/dL — ABNORMAL LOW (ref 8.9–10.3)
GFR calc non Af Amer: >60 mL/min (ref >60)
GLUCOSE: 111 mg/dL — AB (ref 65–99)
Potassium: 3.6 mmol/L (ref 3.5–5.1)
Sodium: 134 mmol/L — ABNORMAL LOW (ref 135–145)

## 2017-01-18 LAB — CBC
HCT: 26.4 % — ABNORMAL LOW (ref 39.0–52.0)
HEMOGLOBIN: 9.3 g/dL — AB (ref 13.0–17.0)
MCH: 42.7 pg — AB (ref 26.0–34.0)
MCHC: 35.2 g/dL (ref 30.0–36.0)
MCV: 121.1 fL — AB (ref 78.0–100.0)
Platelets: 164 10*3/uL (ref 150–400)
RBC: 2.18 MIL/uL — AB (ref 4.22–5.81)
RDW: 14.6 % (ref 11.5–15.5)
WBC: 11.2 10*3/uL — ABNORMAL HIGH (ref 4.0–10.5)

## 2017-01-18 LAB — ECHOCARDIOGRAM COMPLETE
Height: 72 in
Weight: 2567.92 oz

## 2017-01-18 LAB — PROTIME-INR
INR: 1.23
PROTHROMBIN TIME: 15.6 s — AB (ref 11.4–15.2)

## 2017-01-18 MED ORDER — ENSURE ENLIVE PO LIQD: 237.0000 mL | Freq: Two times a day (BID) | ORAL | 12 refills | Status: DC

## 2017-01-18 MED ORDER — SPIRONOLACTONE 100 MG PO TABS: 200.0000 mg | ORAL_TABLET | Freq: Every day | ORAL | 2 refills | Status: DC

## 2017-01-18 MED ORDER — ATORVASTATIN CALCIUM 20 MG PO TABS
20.0000 mg | ORAL_TABLET | Freq: Every day | ORAL | 3 refills | Status: DC
Start: 2017-01-18 — End: 2017-03-01

## 2017-01-18 MED ORDER — SENNOSIDES-DOCUSATE SODIUM 8.6-50 MG PO TABS: 1.0000 | ORAL_TABLET | Freq: Every evening | ORAL | 0 refills | Status: DC | PRN

## 2017-01-18 MED ORDER — WARFARIN SODIUM 3 MG PO TABS
3.0000 mg | ORAL_TABLET | Freq: Once | ORAL | Status: DC
  Filled 2017-01-18: qty 1

## 2017-01-18 MED ORDER — FUROSEMIDE 40 MG PO TABS: 40.0000 mg | ORAL_TABLET | Freq: Every day | ORAL | 2 refills | Status: DC

## 2017-01-18 MED ORDER — MIDODRINE HCL 10 MG PO TABS
10.0000 mg | ORAL_TABLET | Freq: Three times a day (TID) | ORAL | 1 refills | Status: DC
Start: 2017-01-18 — End: 2017-03-12

## 2017-01-18 MED FILL — Lidocaine HCl Local Preservative Free (PF) Inj 1%: INTRAMUSCULAR | Qty: 30 | Status: AC

## 2017-01-18 MED FILL — SPIRONOLACTONE 100 MG TAB: 100 | 30 days supply | Qty: 60 | Fill #0

## 2017-01-18 MED FILL — ATORVASTATIN 20 MG TABLET: 20 | 30 days supply | Qty: 30 | Fill #0

## 2017-01-18 MED FILL — MIDODRINE HCL 10 MG TABLET: 10 | 30 days supply | Qty: 90 | Fill #0

## 2017-01-18 MED FILL — FUROSEMIDE 40 MG TABLET: 40 | 30 days supply | Qty: 30 | Fill #0

## 2017-01-18 NOTE — Progress Notes (Signed)
Patient ID: Jeremy Herman, male   DOB: 09-29-1946, 71 y.o.   MRN: [id number]   SUBJECTIVE:  Overall feeling ok. Denies SOB. Has had trouble with orthostasis, now on midodrine.  HR 100-110 at rest, sinus tachycardia.     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   Amplatzer atrial septal defect occlusion device successfully placed on 2/20.   Scheduled Meds: . atorvastatin  20 mg Oral q1800  . enoxaparin (LOVENOX) injection  40 mg Subcutaneous Daily  . feeding supplement (ENSURE ENLIVE)  237 mL Oral BID BM  . folic acid  1 mg Oral Daily  . furosemide  40 mg Oral Daily  . midodrine  10 mg Oral TID WC  . sodium chloride flush  3 mL Intravenous Q12H  . sodium chloride flush  3 mL Intravenous Q12H  . sodium chloride flush  3 mL Intravenous Q12H  . spironolactone  200 mg Oral Daily  . Warfarin - Pharmacist Dosing Inpatient   Does not apply q1800   Continuous Infusions:  PRN Meds:.sodium chloride, sodium chloride, acetaminophen, ondansetron (ZOFRAN) IV, senna-docusate, sodium chloride flush, sodium chloride flush    Vitals:   01/17/17 1932 01/17/17 2000 01/18/17 0556 01/18/17 0730  BP: 123/75 121/63 (!) 111/59 93/61  Pulse: (!) 102  (!) 111 (!) 112  Resp: 19 (!) 22 (!) 22 15  Temp: 98.1 F (36.7 C)  98.9 F (37.2 C) 98.2 F (36.8 C)  TempSrc: Oral  Oral Oral  SpO2: 100% 100% 100% 96%  Weight: 160 lb 7.9 oz (72.8 kg)  160 lb 7.9 oz (72.8 kg)   Height: 6' (1.829 m)       Intake/Output Summary (Last 24 hours) at 01/18/17 0911 Last data filed at 01/18/17 0730  Gross per 24 hour  Intake              720 ml  Output             1100 ml  Net             -380 ml     LABS: Basic Metabolic Panel:  Recent Labs  40/98/11 0310 01/18/17 0211  NA 134* 134*  K 4.2 3.6  CL 103 101  CO2 24 25  GLUCOSE 115* 111*  BUN 11 10  CREATININE 0.57* 0.58*  CALCIUM 8.2* 8.1*   Liver Function Tests: No results for input(s): AST, ALT, ALKPHOS, BILITOT, PROT, ALBUMIN in the last 72 hours. No results for input(s): LIPASE, AMYLASE in the last 72 hours. CBC:  Recent Labs  01/17/17 0310 01/18/17 0211  WBC 11.5* 11.2*  HGB 9.5* 9.3*  HCT 27.2* 26.4*  MCV 122.0* 121.1*  PLT 183 164   Cardiac Enzymes: No results for input(s): CKTOTAL, CKMB, CKMBINDEX, TROPONINI in the last 72 hours. BNP: Invalid input(s): POCBNP D-Dimer: No results for input(s): DDIMER in the last 72 hours. Hemoglobin A1C: No results for input(s): HGBA1C in the last 72 hours. Fasting Lipid Panel: No results for input(s): CHOL, HDL, LDLCALC, TRIG, CHOLHDL, LDLDIRECT in the last 72 hours. Thyroid Function Tests: No results for input(s): TSH, T4TOTAL, T3FREE, THYROIDAB in the last 72 hours.  Invalid input(s): FREET3  Anemia Panel: No results for input(s): VITAMINB12, FOLATE, FERRITIN, TIBC, IRON, RETICCTPCT in the last 72 hours.  RADIOLOGY: Ct Abdomen Pelvis W Wo Contrast  Result Date: 01/12/2017 CLINICAL DATA:  Evaluate complex cyst identified on recent ultrasound. EXAM: CT ABDOMEN AND PELVIS WITHOUT AND WITH CONTRAST TECHNIQUE: Multidetector CT imaging of the abdomen and pelvis was performed following the standard protocol before and following the bolus administration of intravenous contrast. CONTRAST:  100mL ISOVUE-300 IOPAMIDOL (ISOVUE-300) INJECTION 61% COMPARISON:  None FINDINGS: Lower chest: No acute abnormality. Hepatobiliary: Hepatic steatosis. There is a diffuse changes of cirrhosis involving the liver. No enhancing liver abnormalities identified to suggest hepatoma. The portal vein remains patent. Unremarkable appearance of the gallbladder. Pancreas: Normal appearance of the  pancreas. Spleen: Normal in size without focal abnormality. Adrenals/Urinary Tract: Bilateral kidney lesions are identified. The complex midpole lesion arising from the lateral aspect of the right kidney is identified measuring 2.3 cm and 14 HU precontrast, image 78 of series 2. There is no significant enhancement within this lesion following the IV administration of contrast material with postcontrast Hounsfield units equal to 18.39. Hyperdense precontrast lesion arising from the mid left kidney measures 1.3 cm and 70 HU precontrast, image 64 of series 2. Postcontrast Hounsfield units equal 65.88. No internal septation or enhancing nodularity identified. No hydronephrosis identified. The urinary bladder appears within normal limits. Stomach/Bowel: The stomach is normal. The small bowel loops have a normal course and caliber. No bowel obstruction. Unremarkable appearance of the colon. Vascular/Lymphatic: Aortic atherosclerosis. No aneurysm. No upper abdominal or pelvic adenopathy. No inguinal adenopathy noted. Reproductive: Prostate is unremarkable. Other: Moderate to large volume ascites identified within the abdomen and pelvis. Musculoskeletal: No acute or significant osseous findings. IMPRESSION: 1. 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 2. Morphologic features a liver compatible with cirrhosis. 3. Ascites. 4. Aortic atherosclerosis. Electronically Signed   By: Signa Kellaylor  Stroud M.D.   On: 01/12/2017 13:22   X-ray Chest Pa And Lateral  Result Date: 01/11/2017 CLINICAL DATA:  Shortness of breath for 4 weeks EXAM: CHEST  2 VIEW COMPARISON:  01/10/2017 FINDINGS: Cardiomediastinal silhouette is stable. No infiltrate or pleural effusion. No pulmonary edema. Again noted degenerative changes thoracic spine. IMPRESSION: No active cardiopulmonary disease. Electronically Signed   By: Natasha MeadLiviu  Pop M.D.   On: 01/11/2017 11:21   Ct Angio Chest Pe W Or Wo Contrast  Result Date:  01/10/2017 CLINICAL DATA:  Bilateral leg swelling, shortness of breath. EXAM: CT ANGIOGRAPHY CHEST WITH CONTRAST TECHNIQUE: Multidetector CT imaging of the chest was performed using the standard protocol during bolus administration of intravenous contrast. Multiplanar CT image reconstructions and MIPs were obtained to evaluate the vascular anatomy. CONTRAST:  70 cc Isovue 370 IV COMPARISON:  07/08/2014 FINDINGS: Cardiovascular: Improving clot burden within the pulmonary arteries. There is eccentric chronic clot within the left lower lobe segmental pulmonary artery, best seen on image 51 of series 4. Minimal residual filling defect noted within the right lower lobe pulmonary arterial branch on image 55. Note new areas of thromboembolism. Heart is normal size. Aorta is normal caliber. Extensive diffuse coronary artery calcifications and scattered aortic calcifications. Mediastinum/Nodes: No mediastinal, hilar, or axillary adenopathy. Lungs/Pleura: Nodular area in the right upper lobe on image 41 is stable since prior study and compatible with scarring. Scarring in the right lower lobe, stable. No new airspace opacities or effusions. Upper Abdomen: Severe diffuse low-density throughout the liver compatible with fatty infiltration. Ascites noted adjacent to the liver and  spleen. Musculoskeletal: Chest wall soft tissues are unremarkable. No acute bony abnormality. Review of the MIP images confirms the above findings. IMPRESSION: Partial resolution of the previously seen chronic pulmonary emboli. Mild residual eccentric chronic emboli noted in both lower lobes. No new/ acute pulmonary emboli. Advanced coronary artery disease. Severe low-density throughout the liver, likely fatty infiltration. Upper abdominal ascites adjacent to the liver and spleen. Electronically Signed   By: Charlett Nose M.D.   On: 01/10/2017 16:47   US Abdomen Complete  Result Date: 01/10/2017 CLINICAL DATA:  Abdominal swelling for 3 weeks EXAM:  ABDOMEN ULTRASOUND COMPLETE COMPARISON:  None. FINDINGS: Gallbladder: No gallstones are noted within gallbladder. There is some layering gallbladder sludge. No thickening of gallbladder wall. No sonographic Murphy's sign. Common bile duct: Diameter: 3.5 mm in diameter within normal limits. Liver: No focal hepatic mass. There is diffuse increased echogenicity of the liver suspicious for fatty infiltration or chronic liver disease. IVC: Not well seen due to bowel gas and abdominal ascites. Pancreas: Not well seen due to abdominal ascites. Spleen: Size and appearance within normal limits. Measures 7 cm in length. Right Kidney: Length: 13.5 cm in length. No hydronephrosis or renal calculi. There is a complex possible cystic hypoechoic exophytic lesion in midpole of the left kidney measures 2.2 x 2.2 cm further correlation with enhanced CT or MRI is recommended to exclude a complex enhancing lesion. Left Kidney: Length: 12.7 cm. No hydronephrosis or renal calculi. A cyst in midpole measures 1.8 x 1.2 cm. Abdominal aorta: No aneurysm visualized. Measures up to 2.4 cm in diameter. Other findings: Abdominal ascites is noted. IMPRESSION: 1. No gallstones are noted within gallbladder. Small layering gallbladder sludge. Normal CBD. No sonographic Murphy's sign. 2. Heterogeneous increased echogenicity of the liver which may be due to fatty infiltration or chronic liver disease. 3. No hydronephrosis. There is a complex exophytic lesion midpole of the right kidney measures 2.2 x 2.2 cm. Further evaluation with enhanced CT or MRI is recommended to exclude complex cystic neoplasm. 4. No left hydronephrosis. A cyst in midpole of the left kidney measures 1.8 x 1.2 cm. 5. No aortic aneurysm.  Abdominal ascites is noted. Electronically Signed   By: Natasha Mead M.D.   On: 01/10/2017 16:06   US Paracentesis  Result Date: 01/11/2017 INDICATION: Ascites of unknown etiology. Request is made for diagnostic and therapeutic paracentesis.  EXAM: ULTRASOUND GUIDED DIAGNOSTIC AND THERAPEUTIC PARACENTESIS MEDICATIONS: 1% lidocaine COMPLICATIONS: None immediate. PROCEDURE: Informed written consent was obtained from the patient after a discussion of the risks, benefits and alternatives to treatment. A timeout was performed prior to the initiation of the procedure. Initial ultrasound scanning demonstrates a moderate amount of ascites within the right lower abdominal quadrant. The right lower abdomen was prepped and draped in the usual sterile fashion. 1% lidocaine was used for local anesthesia. Following this, a 19 gauge, 7-cm, Yueh catheter was introduced. An ultrasound image was saved for documentation purposes. The paracentesis was performed. The catheter was removed and a dressing was applied. The patient tolerated the procedure well without immediate post procedural complication. FINDINGS: A total of approximately 2.7 L of yellow fluid was removed. Samples were sent to the laboratory as requested by the clinical team. IMPRESSION: Successful ultrasound-guided paracentesis yielding 2.7 liters of peritoneal fluid. Read by: Barnetta Chapel, PA-C Electronically Signed   By: Judie Petit.  Shick M.D.   On: 01/11/2017 11:38   Dg Chest Port 1 View  Result Date: 01/10/2017 CLINICAL DATA:  Shortness of Breath EXAM: PORTABLE  CHEST 1 VIEW COMPARISON:  04/20/2016 FINDINGS: Heart and mediastinal contours are within normal limits. No focal opacities or effusions. No acute bony abnormality. IMPRESSION: No active disease. Electronically Signed   By: Charlett Nose M.D.   On: 01/10/2017 10:53    PHYSICAL EXAM General: NAD in bed.  Neck: JVP 7 cm, no thyromegaly or thyroid nodule.  Lungs: CTAB.  CV: Nondisplaced PMI.  Heart regular S1/S2, no S3/S4, no murmur.  R and LLE 1-2 + edema at ankles.   Abdomen: Soft, nontender, no hepatosplenomegaly, mildly distended. .  Neurologic: Alert and oriented x 3.  Psych: Normal affect. Extremities: No clubbing or cyanosis. R and LLE  with multiple varicosities. Right groin cath site benign.   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  Found to have ascites and evidence for RV dysfunction by echo as well as at least moderate pulmonary hypertension.  1. RV failure/pulmonary hypertension: Echo 2/18 with EF 60-65%, moderately dilated RV with normal systolic function, PASP 57, mild TR, no mention made of ASD.  TEE showed RV dilation and secundum ASD measuring 1.5 cm.  RHC/LHC -->L to R shunt at atrial level with ASD, Qp/Qs 1.8/1.  Severe pulmonary hypertension by echo but only mild PH on RHC.  Suspect PH is due to a combination of ASD and chronic thromboembolic disease.  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  - Continue spironolactone at current dose.  - Continue Lasix 40 mg daily.   - Would not treat PH with selective pulmonary vasodilators at this point, mild and PVR not significantly elevated. Treatment for now will be ASD closure.  2. 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  Never had outpatient cardiology followup. TEE/Cath with significant ASD and left to right shunt as above. Amplatzer atrial septal defect occlusion device placed on 2/20 successfully.  - Echo to assess device position just completed.  3. Ascites/cirrhosis: Possible hepatic congestion from RV failure versus ETOH cirrhosis. Imaging showed fatty liver and possible cirrhosis.  HCV negative, no ETOH x 16 years. S/P paracentesis 2/14 with 2.7 liters removed.  - Now on increased spironolactone to try to limit ascites.  4. 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  Continue anticoagulation => held for ASD closure, restart warfarin afterwards. 5. Atrial fibrillation: Paroxysmal.   He is currently in NSR. INR per pharmacy.  - Warfarin restarted.  - Would hold off on diltiazem CD use.  He is in NSR and orthostatic, would tolerate sinus tachycardia as he is on midodrine now.   6. CAD: Subtotal occlusion PDA with collaterals from left. Medical management, added statin. No ASA as will be on warfarin.  7. Orthostatic hypotension: Low BP may be due in part to cirrhosis.  Now that he is on midodrine, would stay off diltiazem CD as above.   Marca Ancona 01/18/2017 9:11 AM

## 2017-01-18 NOTE — Progress Notes (Deleted)
Patient ID: Jeremy Herman, male   DOB: 08/11/1946, 71 y.o.   MRN: [id number]   SUBJECTIVE:  Feels well today, had ASD closure yesterday, R groin stable. Still with some dizziness when he stands up. Feels like his breathing is much better. Still with 2+ pretibial pitting edema in LLE.    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   Scheduled Meds: . atorvastatin  20 mg Oral q1800  . enoxaparin (LOVENOX) injection  40 mg Subcutaneous Daily  . feeding supplement (ENSURE ENLIVE)  237 mL Oral BID BM  . folic acid  1 mg Oral Daily  . furosemide  40 mg Oral Daily  . midodrine  10 mg Oral TID WC  . sodium chloride flush  3 mL Intravenous Q12H  . sodium chloride flush  3 mL Intravenous Q12H  . sodium chloride flush  3 mL Intravenous Q12H  . spironolactone  200 mg Oral Daily  . Warfarin - Pharmacist Dosing Inpatient   Does not apply q1800   Continuous Infusions:  PRN Meds:.sodium chloride, sodium chloride, acetaminophen, ondansetron (ZOFRAN) IV, senna-docusate, sodium chloride flush, sodium chloride flush    Vitals:   01/17/17 1835 01/17/17 1932 01/17/17 2000 01/18/17 0556  BP: 103/63 123/75 121/63 (!) 111/59  Pulse: 96 (!) 102  (!) 111  Resp: 17 19 (!) 22 (!) 22  Temp:  98.1 F (36.7 C)  98.9 F (37.2 C)  TempSrc:  Oral  Oral  SpO2: 100% 100% 100% 100%  Weight:  160 lb 7.9 oz (72.8 kg)  160 lb 7.9 oz (72.8 kg)  Height:  6' (1.829 m)      Intake/Output Summary (Last 24 hours) at 01/18/17 0743 Last data filed at 01/17/17 2145  Gross per 24 hour  Intake              360 ml  Output             1100 ml  Net             -740 ml    LABS: Basic Metabolic Panel:  Recent  Labs  01/17/17 0310 01/18/17 0211  NA 134* 134*  K 4.2 3.6  CL 103 101  CO2 24 25  GLUCOSE 115* 111*  BUN 11 10  CREATININE 0.57* 0.58*  CALCIUM 8.2* 8.1*   CBC:  Recent Labs  01/17/17 0310 01/18/17 0211  WBC 11.5* 11.2*  HGB 9.5* 9.3*  HCT 27.2* 26.4*  MCV 122.0* 121.1*  PLT 183 164   RADIOLOGY: Ct Abdomen Pelvis W Wo Contrast  Result Date: 01/12/2017 CLINICAL DATA:  Evaluate complex cyst identified on recent ultrasound. EXAM: CT ABDOMEN AND PELVIS WITHOUT AND WITH CONTRAST TECHNIQUE: Multidetector CT imaging of the abdomen and pelvis was performed following the standard protocol before and following the bolus administration of intravenous contrast. CONTRAST:  ISOVUE-300 IOPAMIDOL (ISOVUE-300) INJECTION 61% COMPARISON:  None FINDINGS: Lower chest: No acute abnormality. Hepatobiliary: Hepatic steatosis. There is a diffuse changes of cirrhosis involving the liver. No enhancing liver abnormalities identified to suggest hepatoma. The portal vein remains patent. Unremarkable appearance of the gallbladder. Pancreas: Normal appearance of the pancreas. Spleen: Normal in size without  focal abnormality. Adrenals/Urinary Tract: Bilateral kidney lesions are identified. The complex midpole lesion arising from the lateral aspect of the right kidney is identified measuring 2.3 cm and 14 HU precontrast, image 78 of series 2. There is no significant enhancement within this lesion following the IV administration of contrast material with postcontrast Hounsfield units equal to 18.39. Hyperdense precontrast lesion arising from the mid left kidney measures 1.3 cm and 70 HU precontrast, image 64 of series 2. Postcontrast Hounsfield units equal 65.88. No internal septation or enhancing nodularity identified. No hydronephrosis identified. The urinary bladder appears within normal limits. Stomach/Bowel: The stomach is normal. The small bowel loops have a normal course and caliber. No bowel obstruction.  Unremarkable appearance of the colon. Vascular/Lymphatic: Aortic atherosclerosis. No aneurysm. No upper abdominal or pelvic adenopathy. No inguinal adenopathy noted. Reproductive: Prostate is unremarkable. Other: Moderate to large volume ascites identified within the abdomen and pelvis. Musculoskeletal: No acute or significant osseous findings. IMPRESSION: 1. 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 2. Morphologic features a liver compatible with cirrhosis. 3. Ascites. 4. Aortic atherosclerosis. Electronically Signed   By: Signa Kell M.D.   On: 01/12/2017 13:22   X-ray Chest Pa And Lateral  Result Date: 01/11/2017 CLINICAL DATA:  Shortness of breath for 4 weeks EXAM: CHEST  2 VIEW COMPARISON:  01/10/2017 FINDINGS: Cardiomediastinal silhouette is stable. No infiltrate or pleural effusion. No pulmonary edema. Again noted degenerative changes thoracic spine. IMPRESSION: No active cardiopulmonary disease. Electronically Signed   By: Natasha Mead M.D.   On: 01/11/2017 11:21   Ct Angio Chest Pe W Or Wo Contrast  Result Date: 01/10/2017 CLINICAL DATA:  Bilateral leg swelling, shortness of breath. EXAM: CT ANGIOGRAPHY CHEST WITH CONTRAST TECHNIQUE: Multidetector CT imaging of the chest was performed using the standard protocol during bolus administration of intravenous contrast. Multiplanar CT image reconstructions and MIPs were obtained to evaluate the vascular anatomy. CONTRAST:  70 cc Isovue 370 IV COMPARISON:  07/08/2014 FINDINGS: Cardiovascular: Improving clot burden within the pulmonary arteries. There is eccentric chronic clot within the left lower lobe segmental pulmonary artery, best seen on image 51 of series 4. Minimal residual filling defect noted within the right lower lobe pulmonary arterial branch on image 55. Note new areas of thromboembolism. Heart is normal size. Aorta is normal caliber. Extensive diffuse coronary artery calcifications and scattered aortic  calcifications. Mediastinum/Nodes: No mediastinal, hilar, or axillary adenopathy. Lungs/Pleura: Nodular area in the right upper lobe on image 41 is stable since prior study and compatible with scarring. Scarring in the right lower lobe, stable. No new airspace opacities or effusions. Upper Abdomen: Severe diffuse low-density throughout the liver compatible with fatty infiltration. Ascites noted adjacent to the liver and spleen. Musculoskeletal: Chest wall soft tissues are unremarkable. No acute bony abnormality. Review of the MIP images confirms the above findings. IMPRESSION: Partial resolution of the previously seen chronic pulmonary emboli. Mild residual eccentric chronic emboli noted in both lower lobes. No new/ acute pulmonary emboli. Advanced coronary artery disease. Severe low-density throughout the liver, likely fatty infiltration. Upper abdominal ascites adjacent to the liver and spleen. Electronically Signed   By: Charlett Nose M.D.   On: 01/10/2017 16:47   US Abdomen Complete  Result Date: 01/10/2017 CLINICAL DATA:  Abdominal swelling for 3 weeks EXAM: ABDOMEN ULTRASOUND COMPLETE COMPARISON:  None. FINDINGS: Gallbladder: No gallstones are noted within gallbladder. There is some layering gallbladder sludge. No thickening of gallbladder wall. No sonographic Murphy's sign. Common bile duct:  Diameter: 3.5 mm in diameter within normal limits. Liver: No focal hepatic mass. There is diffuse increased echogenicity of the liver suspicious for fatty infiltration or chronic liver disease. IVC: Not well seen due to bowel gas and abdominal ascites. Pancreas: Not well seen due to abdominal ascites. Spleen: Size and appearance within normal limits. Measures 7 cm in length. Right Kidney: Length: 13.5 cm in length. No hydronephrosis or renal calculi. There is a complex possible cystic hypoechoic exophytic lesion in midpole of the left kidney measures 2.2 x 2.2 cm further correlation with enhanced CT or MRI is  recommended to exclude a complex enhancing lesion. Left Kidney: Length: 12.7 cm. No hydronephrosis or renal calculi. A cyst in midpole measures 1.8 x 1.2 cm. Abdominal aorta: No aneurysm visualized. Measures up to 2.4 cm in diameter. Other findings: Abdominal ascites is noted. IMPRESSION: 1. No gallstones are noted within gallbladder. Small layering gallbladder sludge. Normal CBD. No sonographic Murphy's sign. 2. Heterogeneous increased echogenicity of the liver which may be due to fatty infiltration or chronic liver disease. 3. No hydronephrosis. There is a complex exophytic lesion midpole of the right kidney measures 2.2 x 2.2 cm. Further evaluation with enhanced CT or MRI is recommended to exclude complex cystic neoplasm. 4. No left hydronephrosis. A cyst in midpole of the left kidney measures 1.8 x 1.2 cm. 5. No aortic aneurysm.  Abdominal ascites is noted. Electronically Signed   By: Natasha Mead M.D.   On: 01/10/2017 16:06   US Paracentesis  Result Date: 01/11/2017 INDICATION: Ascites of unknown etiology. Request is made for diagnostic and therapeutic paracentesis. EXAM: ULTRASOUND GUIDED DIAGNOSTIC AND THERAPEUTIC PARACENTESIS MEDICATIONS: 1% lidocaine COMPLICATIONS: None immediate. PROCEDURE: Informed written consent was obtained from the patient after a discussion of the risks, benefits and alternatives to treatment. A timeout was performed prior to the initiation of the procedure. Initial ultrasound scanning demonstrates a moderate amount of ascites within the right lower abdominal quadrant. The right lower abdomen was prepped and draped in the usual sterile fashion. 1% lidocaine was used for local anesthesia. Following this, a 19 gauge, 7-cm, Yueh catheter was introduced. An ultrasound image was saved for documentation purposes. The paracentesis was performed. The catheter was removed and a dressing was applied. The patient tolerated the procedure well without immediate post procedural complication.  FINDINGS: A total of approximately 2.7 L of yellow fluid was removed. Samples were sent to the laboratory as requested by the clinical team. IMPRESSION: Successful ultrasound-guided paracentesis yielding 2.7 liters of peritoneal fluid. Read by: Barnetta Chapel, PA-C Electronically Signed   By: Judie Petit.  Shick M.D.   On: 01/11/2017 11:38   Dg Chest Port 1 View  Result Date: 01/10/2017 CLINICAL DATA:  Shortness of Breath EXAM: PORTABLE CHEST 1 VIEW COMPARISON:  04/20/2016 FINDINGS: Heart and mediastinal contours are within normal limits. No focal opacities or effusions. No acute bony abnormality. IMPRESSION: No active disease. Electronically Signed   By: Charlett Nose M.D.   On: 01/10/2017 10:53    PHYSICAL EXAM General: NAD in bed.  Neck: JVP 8-9 cm, no thyromegaly or thyroid nodule.  Lungs: Coarse rhonchi in LLL.  CV: Nondisplaced PMI.  Heart regular S1/S2, no S3/S4, no murmur.  2+ pitting edema in BLE, worse in LLE.  Abdomen: Soft, nontender, no hepatosplenomegaly, mildly distended.  Neurologic: Alert and oriented x 3.  Psych: Normal affect. Extremities: No clubbing or cyanosis. R and LLE with multiple varicosities.   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  Found to have ascites and evidence for RV dysfunction by echo as well as at least moderate pulmonary hypertension.  1. RV failure/pulmonary hypertension: Echo 2/18 with EF 60-65%, moderately dilated RV with normal systolic function, PASP 57, mild TR, no mention made of ASD.  TEE showed RV dilation and secundum ASD measuring 1.5 cm.  RHC/LHC -->L to R shunt at atrial level with ASD, Qp/Qs 1.8/1.  Severe pulmonary hypertension by echo but only mild PH on RHC.  Suspect PH is due to a combination of ASD and chronic thromboembolic disease.   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  - s/p ASD closure yesterday.  Cleda Daub- Spiro 200mg  daily (has ETOH cirrhosis).  - Consider increasing Lasix to 40mg  BID.  - Would not treat PH with selective pulmonary vasodilators at this point, mild and PVR not significantly elevated.  2. 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  Never had outpatient cardiology followup. TEE/Cath with significant ASD and left to right shunt as above.  - Dr Excell Seltzerooper preformed ASD closure yesterday.   3. Ascites/cirrhosis: Possible hepatic congestion from RV failure versus ETOH cirrhosis. Imaging showed fatty liver and possible cirrhosis.  HCV negative, no ETOH x 16 years. S/P paracentesis 2/14 with 2.7 liters removed.  - Now on increased spironolactone to try to limit ascites.  4. 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  Coumadin restarted yesterday, INR is pending for today.  5. Atrial fibrillation: Paroxysmal.  He is currently in NSR. INR per pharmacy. Continue coumadin after procedure.  -diltiazem discontinued as he is in NSR and he was orthostatic. Now in Sinus tach.  6. CAD: Subtotal occlusion PDA with collaterals from left. Medical management, added statin. No ASA as will be on warfarin.  7. Orthostatic hypotension: Low BP may be due in part to cirrhosis.  - Feels dizzy when he stands up, encouraged him to sit in the chair today.  8. Sinus tach - Rates in the 110's. BP is soft. No room for beta blocker.      Little Ishikawarin E Smith 01/18/2017 7:43 AM

## 2017-01-18 NOTE — Care Management Note (Signed)
Case Management Note  Patient Details  Name: Jeremy PaganiniJames Roseman MRN: [id number] Date of Birth: 1946/11/15  Subjective/Objective:   S/p ASD closure, he lives alone, he states he does not need a reacher and he does not need a w/chair because it will be to big for his home,  Also he states he has a walker at home that he will be using, he states he does not want a shower seat. NCM offered choice for HHPT, patient states he does not want HHPT, he is refusing at this time.  He is for possible dc today.  NCM will cont to follow for dc needs.  The therapist talked to patient and explained we think he really needs the HHPT , but patient was determined that he did not want it.                   Action/Plan:   Expected Discharge Date:                  Expected Discharge Plan:  Home w Home Health Services  In-House Referral:     Discharge planning Services  CM Consult  Post Acute Care Choice:    Choice offered to:     DME Arranged:    DME Agency:     HH Arranged:  Patient Refused, PT HH Agency:     Status of Service:  Completed, signed off  If discussed at Long Length of Stay Meetings, dates discussed:    Additional Comments:  Leone Havenaylor, Blaine Guiffre Clinton, RN 01/18/2017, 11:51 AM

## 2017-01-18 NOTE — Progress Notes (Signed)
Physical Therapy Treatment Patient Details Name: Jeremy Herman MRN: [id number] DOB: Oct 29, 1946 Today's Date: 01/18/2017    History of Present Illness 71 y.o. male.With past medical history significant for  paroxysmal atrial fibrillation and [name], is on chronic anticoagulation came to ED with complaint of worsening shortness of breath lightheadedness and dizziness with ambulation, along with bilateral leg swelling and abdominal swelling for past 3 weeks. Pt with ascites and evidence for RV dysfunction by echo as well as at least moderate pulmonary hypertension, planned cardiac cath 2/16    PT Comments    Pt has progressed well, but would benefit from HHPT to recondition.  However, pt is refusing at this time and also declining the equipment recommendations.     Follow Up Recommendations  Home health PT;Supervision/Assistance - 24 hour     Equipment Recommendations  Wheelchair (measurements PT)    Recommendations for Other Services       Precautions / Restrictions Precautions Precautions: Fall Restrictions Weight Bearing Restrictions: No    Mobility  Bed Mobility               General bed mobility comments: pt in chair on arrival  Transfers Overall transfer level: Needs assistance Equipment used: Rolling walker (2 wheeled) Transfers: Sit to/from Stand Sit to Stand: Supervision         General transfer comment: cues for safest hand placement  Ambulation/Gait Ambulation/Gait assistance: Min guard Ambulation Distance (Feet): 90 Feet Assistive device: Rolling walker (2 wheeled) Gait Pattern/deviations: Step-through pattern   Gait velocity interpretation: Below normal speed for age/gender General Gait Details: generally steady with RW, but moderate use of the RW.  Fatiguing on return   Stairs            Wheelchair Mobility    Modified Rankin (Stroke Patients Only)       Balance Overall balance assessment: Needs assistance    Sitting balance-Leahy Scale: Good     Standing balance support: Bilateral upper extremity supported Standing balance-Leahy Scale: Fair Standing balance comment: RW for support                    Cognition Arousal/Alertness: Awake/alert Behavior During Therapy: WFL for tasks assessed/performed Overall Cognitive Status: Within Functional Limits for tasks assessed                      Exercises General Exercises - Lower Extremity Long Arc Quad: AROM;Strengthening;Both;10 reps;Seated Hip Flexion/Marching: PROM;Strengthening;Both;10 reps;Seated    General Comments        Pertinent Vitals/Pain Pain Assessment: No/denies pain    Home Living                      Prior Function            PT Goals (current goals can now be found in the care plan section) Acute Rehab PT Goals Patient Stated Goal: get better and return home PT Goal Formulation: With patient Time For Goal Achievement: 01/26/17 Potential to Achieve Goals: Good Progress towards PT goals: Progressing toward goals    Frequency    Min 3X/week      PT Plan Current plan remains appropriate    Co-evaluation             End of Session   Activity Tolerance: Treatment limited secondary to medical complications (Comment) Patient left: in chair;with call bell/phone within reach Nurse Communication: Mobility status PT Visit Diagnosis: Muscle weakness (generalized) (M62.81);Unsteadiness on  feet (R26.81)     Time: 1610-9604 PT Time Calculation (min) (ACUTE ONLY): 27 min  Charges:  $Gait Training: 8-22 mins $Therapeutic Exercise: 8-22 mins                    G Codes:       Eliseo Gum Leianne Callins 01/18/2017, 12:00 PM 01/18/2017  Rudyard Bing, PT [phone]  (pager)

## 2017-01-18 NOTE — Progress Notes (Signed)
ANTICOAGULATION CONSULT NOTE  Pharmacy Consult for warfarin Indication: hx DVT/PE  Allergies  Allergen Reactions  . Fish Allergy Anaphylaxis and Other (See Comments)    Diarrhea     Patient Measurements: Height: 6' (182.9 cm) Weight: 160 lb 7.9 oz (72.8 kg) IBW/kg (Calculated) : 77.6   Vital Signs: Temp: 97.4 F (36.3 C) (02/21 1100) Temp Source: Oral (02/21 1100) BP: 104/66 (02/21 1100) Pulse Rate: 117 (02/21 1100)  Labs:  Recent Labs  01/16/17 0229 01/16/17 1107 01/17/17 0310 01/18/17 0211 01/18/17 0918  HGB 9.9*  --  9.5* 9.3*  --   HCT 28.2*  --  27.2* 26.4*  --   PLT 179  --  183 164  --   LABPROT 16.6* 15.9* 17.9*  --  15.6*  INR 1.33 1.27 1.47  --  1.23  CREATININE 0.63  --  0.57* 0.58*  --     Estimated Creatinine Clearance: 89.7 mL/min (by C-G formula based on SCr of 0.58 mg/dL (L)).  Assessment: [age] YOM s/p ASD repair on 2/20 on warfarin PTA for hx DVT/PE. Warfarin was being given, then held starting 2/15 due to procedures. Warfarin resumed 2/20.   INR 1.2 today. CBC stable overnight.   Home dose of warfarin 2mg  daily except 1mg  on TTSat.  Goal of Therapy:  INR 2-3 Monitor platelets by anticoagulation protocol: Yes   Plan:  -warfarin 3 mg po x1 tonight then resume home dose tomorrow -daily INR -patient also receiving sq lovenox  Sheppard CoilFrank Wilson PharmD., BCPS Clinical Pharmacist Pager [phone] 01/18/2017 2:24 PM

## 2017-01-18 NOTE — Care Management Note (Signed)
Case Management Note Donn PieriniKristi Jordynn Perrier RN, BSN Unit 2W-Case Manager [phone]  Patient Details  Name: Jeremy Herman MRN: [id number] Date of Birth: January 27, 1946  Subjective/Objective:  Pt admitted with SOB and abd swelling s/p paracentesis                   Action/Plan: PTA pt lived at home alone, independent- [facility] per PT eval recommendations for HHPT- will need to f/u prior to discharge and place HHPT order with [facility]. - pt for ASD closure in Cath Lab 2/20   Expected Discharge Date:                  Expected Discharge Plan:  Home w Home Health Services  In-House Referral:     Discharge planning Services  CM Consult  Post Acute Care Choice:    Choice offered to:     DME Arranged:    DME Agency:     HH Arranged:    HH Agency:     Status of Service:  In process, will continue to follow  If discussed at Long Length of Stay Meetings, dates discussed:    Additional Comments:  Jeremy Herman, Jeremy Mcevoy Hall, RN 01/18/2017, 10:42 AM

## 2017-01-18 NOTE — Progress Notes (Signed)
  Echocardiogram 2D Echocardiogram has been performed.  Leta JunglingCooper, Undrea Archbold M 01/18/2017, 8:58 AM

## 2017-01-18 NOTE — Discharge Summary (Signed)
Name: Jeremy Herman MRN: [id number] DOB: Apr 07, 1946 71 y.o. PCP: Dessa PhiJosalyn Funches, MD  Date of Admission: 01/10/2017  9:29 AM Date of Discharge: 01/18/2017 Attending Physician: Earl LagosNischal Narendra, MD  Discharge Diagnosis: 1. Liver cirrhosis. 2. ASD s/p percutaneous closure.  Active Problems:   Chronic pulmonary embolism (HCC)   Ascites   Pulmonary embolus (HCC)   Other ascites   Shortness of breath   SOB (shortness of breath)   Pulmonary hypertension, primary (HCC)   RVF (right ventricular failure) (HCC)   Ascites due to alcoholic cirrhosis (HCC)   Atrial septal defect, secundum   Malnutrition of moderate degree   Pressure injury of skin   Discharge Medications: Allergies as of 01/18/2017      Reactions   Fish Allergy Anaphylaxis, Other (See Comments)   Diarrhea       Medication List    STOP taking these medications   diltiazem 180 MG 24 hr capsule Commonly known as:  CARDIZEM CD     TAKE these medications   atorvastatin 20 MG tablet Commonly known as:  LIPITOR Take 1 tablet (20 mg total) by mouth daily at 6 PM. Notes to patient:  Cholesterol    feeding supplement (ENSURE ENLIVE) Liqd Take 237 mLs by mouth 2 (two) times daily between meals. Notes to patient:  Supplement between meals   folic acid 1 MG tablet Commonly known as:  FOLVITE Take 1 tablet (1 mg total) by mouth daily. Notes to patient:  Supplement    furosemide 40 MG tablet Commonly known as:  LASIX Take 1 tablet (40 mg total) by mouth daily. Start taking on:  01/19/2017 Notes to patient:  Fluid pill   midodrine 10 MG tablet Commonly known as:  PROAMATINE Take 1 tablet (10 mg total) by mouth 3 (three) times daily with meals. Notes to patient:  Increases blood pressure   multivitamin tablet Take 1 tablet by mouth daily. Notes to patient:  Supplement    senna-docusate 8.6-50 MG tablet Commonly known as:  Senokot-S Take 1 tablet by mouth at bedtime as needed for mild constipation. Notes to  patient:  As needed for constipation    spironolactone 100 MG tablet Commonly known as:  ALDACTONE Take 2 tablets (200 mg total) by mouth daily. Start taking on:  01/19/2017 Notes to patient:  Weak fluid pill   thiamine 100 MG tablet Commonly known as:  VITAMIN B-1 Take 1 tablet (100 mg total) by mouth daily. Notes to patient:  Supplement for anemia   traMADol 50 MG tablet Commonly known as:  ULTRAM Take 1 tablet (50 mg total) by mouth every 12 (twelve) hours as needed. Notes to patient:  For pain as needed   warfarin 2 MG tablet Commonly known as:  COUMADIN Take 1 tablet by mouth every day but 2 tablets on Tuesdays and Saturdays. What changed:  how much to take  how to take this  when to take this  additional instructions Notes to patient:  Prevents clotting in the heart chamber, heart attack and stroke       Disposition and follow-up:   Jerem[facility] Herman [facility].  At the hospital follow up visit please address:  1.  His blood pressure and heart rate. His diltiazem was stopped per cardiology, having mild sinus tachycardia. He was started on midodrine because of positive orthostatic vitals and need for diuresis. -He needs a referral to GI and hematology for his liver cirrhosis and possible hemachromatosis.  2.  Labs / imaging needed at time of follow-up: CMP  3.  Pending labs/ test needing follow-up: MMA and Hemachromotosis DNA ( C2Y82 mutation)  Follow-up Appointments: Follow-up Information    Lora Paula, MD Follow up.   Specialty:  Family Medicine Contact information: [address] [place] [address] [phone]           Hospital Course by problem list:  Jeremy Herman a 70 y.o.male.With past medical history significant for paroxysmal atrial fibrillation and [name], is on chronic anticoagulation came to ED with complaint of worsening shortness of breath  lightheadedness and dizziness with ambulation,along with bilateral leg swelling and abdominal swellingfor past3 weeks.  Liver cirrhosis. He was found to have ascites and bilateral lower extremity edema. Paracentesis was done, 2.7 later of clear fluid was drained. Results favors transudate, cytology was negative for any malignant cells. CT abdomen was consistent with liver cirrhosis and fatty infiltrate. He has a alcohol abuse history, but quit 16 years ago according to patient. His iron and ferritin levels were high, more consistent with hemochromatosis. Labs were sent for C2 Y82 mutation for hemochromatosis, if he is heterozygous might need a liver biopsy for confirmation of the cause of his ascites. He will need a GI referral as an outpatient for management of his liver cirrhosis. His ascites and lower extremity edema was managed with diuresis with spironolactone and Lasix. Initially his blood pressure was soft with positive orthostatic vitals, he was started on midodrine 10 mg 3 times a day, as midodrine have shown some survival benefit in patients with liver cirrhosis and ascites. It helped him tolerate diuresis very well. He was discharged home on spironolactone 200 mg daily, Lasix 40 mg daily and midodrine 10 mg 3 times a day. Will need a close follow-up with PCP.  Mild Pulmonary hypertension with history of chronic PE and ASD. On initial echo he was found to have moderate pulmonary hypertension and a large secundum ASD with left-to-right shunt. On right heart cath. He was having mild pulmonary hypertension, there was no right heart failure, right heart pressures were mildly elevated, can be contributing to his lower extremity edema and ascites, less likely as he never has JVD. He was followed up with heart failure team Dr Shirlee Latch and Dr. Excell Seltzer to close his significant ASD percutaneously. He has this history of ASD since birth,refered  to Dr. Excell Seltzer few years ago, for closure, never followed up. He  got his successful percutaneous closure of ASD done during this stay on 01/17/2017 by Dr. Excell Seltzer. No other medical management needed consideringmild pulmonary hypertension at this point.  Macrocytic Anemia. Found to have macrocytic anemia with low folate levels. He was started on folic acid. -MMA results are still pending.  History of paroxysmal atrial fibrillation. His diltiazem was stopped by cardiology, in order to better tolerate diuresis, and because of his softer blood pressures. He was mildly tachycardic with heart rate between 110-120 on discharge. He was asymptomatic, was instructed to seek medical attention if becomes symptomatic. He was advised to continue Coumadin.  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 -He will need a outpatient follow-up.  Discharge Vitals:   BP 104/66 (BP Location: Right Arm)   Pulse (!) 117   Temp 97.4 F (36.3 C) (Oral)   Resp 17  Ht 6' (1.829 m)   Wt 160 lb 7.9 oz (72.8 kg)   SpO2 100%   BMI 21.77 kg/m   Gen.Little emaciated man,sitting comfortably in chair,in no acute distress. Chest.Clear bilaterally, no rhonchi or wheeze. CVS. Sinus tachycardia. Abdomen. Soft, nontender,distended, same as yesterday, bowel sounds positive. Extremities.Bilateral  pitting edema up to mid calves, Left more then right.  Pertinent Labs, Studies, and Procedures:   Lab results:     Basic Metabolic Panel:  Recent Labs (last 2 labs)    Recent Labs  01/10/17 1028 01/11/17 0230  NA 138 136  K 3.8 3.8  CL 103 100*  CO2 20* 23  GLUCOSE 119* 109*  BUN 13 12  CREATININE 0.76 0.71  CALCIUM 8.2* 8.0*     Liver Function Tests:  Recent Labs (last 2 labs)    Recent Labs  01/10/17 1028 01/11/17 0230  AST 114* 94*  ALT 62 57  ALKPHOS 109 99  BILITOT 2.8* 2.1*  PROT 6.5 5.8*    ALBUMIN 2.6* 2.5*      Recent Labs (last 2 labs)    Recent Labs  01/10/17 1028  LIPASE 22     CBC:  Recent Labs (last 2 labs)    Recent Labs  01/10/17 1028 01/11/17 0230  WBC 15.8* 13.5*  NEUTROABS 13.6*  --   HGB 11.2* 9.9*  HCT 32.1* 28.6*  MCV 128.4* 125.4*  PLT 201 188     Thyroid Function Tests:  Recent Labs (last 2 labs)    Recent Labs  01/10/17 1251 01/11/17 0230  TSH 5.310*  --   FREET4  --  0.96     Coagulation:  Recent Labs (last 2 labs)    Recent Labs  01/10/17 1028 01/11/17 0230  INR 3.22 2.84     ETOH  <5.  CBC Latest Ref Rng & Units 01/18/2017 01/17/2017 01/16/2017  WBC 4.0 - 10.5 K/uL 11.2(H) 11.5(H) 12.5(H)  Hemoglobin 13.0 - 17.0 g/dL 1.3(K) 4.4(W) 1.0(U)  Hematocrit 39.0 - 52.0 % 26.4(L) 27.2(L) 28.2(L)  Platelets 150 - 400 K/uL 164 183 179   CMP Latest Ref Rng & Units 01/18/2017 01/17/2017 01/16/2017  Glucose 65 - 99 mg/dL 725(D) 664(Q) 034(V)  BUN 6 - 20 mg/dL 10 11 10   Creatinine 0.61 - 1.24 mg/dL 4.25(Z) 5.63(O) 7.56  Sodium 135 - 145 mmol/L 134(L) 134(L) 136  Potassium 3.5 - 5.1 mmol/L 3.6 4.2 3.7  Chloride 101 - 111 mmol/L 101 103 104  CO2 22 - 32 mmol/L 25 24 23   Calcium 8.9 - 10.3 mg/dL 8.1(L) 8.2(L) 8.0(L)  Total Protein 6.5 - 8.1 g/dL - - -  Total Bilirubin 0.3 - 1.2 mg/dL - - -  Alkaline Phos 38 - 126 U/L - - -  AST 15 - 41 U/L - - -  ALT 17 - 63 U/L - - -   Misc. Labs:  Folate. 2.9 B12. 373 Hep C . Negative HIV. Nonreactive  Ascites fluid. Glucose. 117 LDH. 44 Total protein.<3.0 Albumin. 0.4 Serum Albumin.2.3 SAAG. 1.9  Body fluid cell count with differential  Order: [id number]  Status:  Final result Visible to patient:  No (Not Released) Next appt:  None   Ref Range & Units 2d ago  Fluid Type-FCT  Peritoneal   Color, Fluid YELLOW YELLOW    Appearance, Fluid CLEAR HAZY    WBC, Fluid 0 - 1,000 cu mm 85   Neutrophil Count, Fluid 0 - 25 % 6   Lymphs, Fluid % 15  Monocyte-Macrophage-Serous Fluid 50 - 90 % 79   Eos, Fluid % 0   Other Cells, Fluid % MESOTHELIAL CELLS PRESENT        Gram stain  Order: [id number]  Status:  Final result Visible to patient:  No (Not Released) Next appt:  None   2d ago  Specimen Description FLUID PLEURAL   Special Requests NONE   Gram Stain FEW WBC PRESENT,BOTH PMN AND MONONUCLEAR  NO ORGANISMS SEEN     Report Status 01/12/2017 FINAL        Fluid cytology. Diagnosis PERITONEAL/ASCITIC FLUID (SPECIMEN 1 OF 1, COLLECTED 01/11/17): REACTIVE MESOTHELIAL CELLS PRESENT.   Imaging results:   Imaging Results (Last 48 hours)  X-ray Chest Pa And Lateral  Result Date: 01/11/2017 CLINICAL DATA:  Shortness of breath for 4 weeks EXAM: CHEST  2 VIEW COMPARISON:  01/10/2017 FINDINGS: Cardiomediastinal silhouette is stable. No infiltrate or pleural effusion. No pulmonary edema. Again noted degenerative changes thoracic spine. IMPRESSION: No active cardiopulmonary disease. Electronically Signed   By: Natasha Mead M.D.   On: 01/11/2017 11:21   Ct Angio Chest Pe W Or Wo Contrast  Result Date: 01/10/2017 CLINICAL DATA:  Bilateral leg swelling, shortness of breath. EXAM: CT ANGIOGRAPHY CHEST WITH CONTRAST TECHNIQUE: Multidetector CT imaging of the chest was performed using the standard protocol during bolus administration of intravenous contrast. Multiplanar CT image reconstructions and MIPs were obtained to evaluate the vascular anatomy. CONTRAST:  70 cc Isovue 370 IV COMPARISON:  07/08/2014 FINDINGS: Cardiovascular: Improving clot burden within the pulmonary arteries. There is eccentric chronic clot within the left lower lobe segmental pulmonary artery, best seen on image 51 of series 4. Minimal residual filling defect noted within the right lower lobe pulmonary arterial branch on image 55. Note new areas of thromboembolism. Heart is normal size. Aorta is normal caliber. Extensive diffuse coronary artery calcifications and  scattered aortic calcifications. Mediastinum/Nodes: No mediastinal, hilar, or axillary adenopathy. Lungs/Pleura: Nodular area in the right upper lobe on image 41 is stable since prior study and compatible with scarring. Scarring in the right lower lobe, stable. No new airspace opacities or effusions. Upper Abdomen: Severe diffuse low-density throughout the liver compatible with fatty infiltration. Ascites noted adjacent to the liver and spleen. Musculoskeletal: Chest wall soft tissues are unremarkable. No acute bony abnormality. Review of the MIP images confirms the above findings. IMPRESSION: Partial resolution of the previously seen chronic pulmonary emboli. Mild residual eccentric chronic emboli noted in both lower lobes. No new/ acute pulmonary emboli. Advanced coronary artery disease. Severe low-density throughout the liver, likely fatty infiltration. Upper abdominal ascites adjacent to the liver and spleen. Electronically Signed   By: Charlett Nose M.D.   On: 01/10/2017 16:47   US Abdomen Complete  Result Date: 01/10/2017 CLINICAL DATA:  Abdominal swelling for 3 weeks EXAM: ABDOMEN ULTRASOUND COMPLETE COMPARISON:  None. FINDINGS: Gallbladder: No gallstones are noted within gallbladder. There is some layering gallbladder sludge. No thickening of gallbladder wall. No sonographic Murphy's sign. Common bile duct: Diameter: 3.5 mm in diameter within normal limits. Liver: No focal hepatic mass. There is diffuse increased echogenicity of the liver suspicious for fatty infiltration or chronic liver disease. IVC: Not well seen due to bowel gas and abdominal ascites. Pancreas: Not well seen due to abdominal ascites. Spleen: Size and appearance within normal limits. Measures 7 cm in length. Right Kidney: Length: 13.5 cm in length. No hydronephrosis or renal calculi. There is a complex possible cystic hypoechoic exophytic lesion in midpole of the left  kidney measures 2.2 x 2.2 cm further correlation with enhanced CT  or MRI is recommended to exclude a complex enhancing lesion. Left Kidney: Length: 12.7 cm. No hydronephrosis or renal calculi. A cyst in midpole measures 1.8 x 1.2 cm. Abdominal aorta: No aneurysm visualized. Measures up to 2.4 cm in diameter. Other findings: Abdominal ascites is noted. IMPRESSION: 1. No gallstones are noted within gallbladder. Small layering gallbladder sludge. Normal CBD. No sonographic Murphy's sign. 2. Heterogeneous increased echogenicity of the liver which may be due to fatty infiltration or chronic liver disease. 3. No hydronephrosis. There is a complex exophytic lesion midpole of the right kidney measures 2.2 x 2.2 cm. Further evaluation with enhanced CT or MRI is recommended to exclude complex cystic neoplasm. 4. No left hydronephrosis. A cyst in midpole of the left kidney measures 1.8 x 1.2 cm. 5. No aortic aneurysm.  Abdominal ascites is noted. Electronically Signed   By: Natasha Mead M.D.   On: 01/10/2017 16:06   US Paracentesis  Result Date: 01/11/2017 INDICATION: Ascites of unknown etiology. Request is made for diagnostic and therapeutic paracentesis. EXAM: ULTRASOUND GUIDED DIAGNOSTIC AND THERAPEUTIC PARACENTESIS MEDICATIONS: 1% lidocaine COMPLICATIONS: None immediate. PROCEDURE: Informed written consent was obtained from the patient after a discussion of the risks, benefits and alternatives to treatment. A timeout was performed prior to the initiation of the procedure. Initial ultrasound scanning demonstrates a moderate amount of ascites within the right lower abdominal quadrant. The right lower abdomen was prepped and draped in the usual sterile fashion. 1% lidocaine was used for local anesthesia. Following this, a 19 gauge, 7-cm, Yueh catheter was introduced. An ultrasound image was saved for documentation purposes. The paracentesis was performed. The catheter was removed and a dressing was applied. The patient tolerated the procedure well without immediate post procedural  complication. FINDINGS: A total of approximately 2.7 L of yellow fluid was removed. Samples were sent to the laboratory as requested by the clinical team. IMPRESSION: Successful ultrasound-guided paracentesis yielding 2.7 liters of peritoneal fluid. Read by: Barnetta Chapel, PA-C Electronically Signed   By: Judie Petit.  Shick M.D.   On: 01/11/2017 11:38   Dg Chest Port 1 View  Result Date: 01/10/2017 CLINICAL DATA:  Shortness of Breath EXAM: PORTABLE CHEST 1 VIEW COMPARISON:  04/20/2016 FINDINGS: Heart and mediastinal contours are within normal limits. No focal opacities or effusions. No acute bony abnormality. IMPRESSION: No active disease. Electronically Signed   By: Charlett Nose M.D.   On: 01/10/2017 10:53    CT abdomen and pelvis.  FINDINGS: Lower chest: No acute abnormality.  Hepatobiliary: Hepatic steatosis. There is a diffuse changes of cirrhosis involving the liver. No enhancing liver abnormalities identified to suggest hepatoma. The portal vein remains patent. Unremarkable appearance of the gallbladder.  Pancreas: Normal appearance of the pancreas.  Spleen: Normal in size without focal abnormality.  Adrenals/Urinary Tract: Bilateral kidney lesions are identified. The complex midpole lesion arising from the lateral aspect of the right kidney is identified measuring 2.3 cm and 14 HU precontrast, image 78 of series 2. There is no significant enhancement within this lesion following the IV administration of contrast material with postcontrast Hounsfield units equal to 18.39. Hyperdense precontrast lesion arising from the mid left kidney measures 1.3 cm and 70 HU precontrast, image 64 of series 2. Postcontrast Hounsfield units equal 65.88. No internal septation or enhancing nodularity identified. No hydronephrosis identified. The urinary bladder appears within normal limits.  Stomach/Bowel: The stomach is normal. The small bowel loops have a normal course  and caliber. No bowel  obstruction. Unremarkable appearance of the colon.  Vascular/Lymphatic: Aortic atherosclerosis. No aneurysm. No upper abdominal or pelvic adenopathy. No inguinal adenopathy noted.  Reproductive: Prostate is unremarkable.  Other: Moderate to large volume ascites identified within the abdomen and pelvis.  Musculoskeletal: No acute or significant osseous findings.  IMPRESSION: 1. 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 2. Morphologic features a liver compatible with cirrhosis. 3. Ascites. 4. Aortic atherosclerosis.  Other results:  EKG: Normal sinus rhythm at 92 bpm, low voltage, 1 PVC, normal axis, normal intervals, no LVH by voltage, no significant Q waves, no ST or T wave changes. No significant changes from the previous ECG on 12/09/2016.  ECHO. Study Conclusions  - Left ventricle: The cavity size was normal. Wall thickness was   normal. The estimated ejection fraction was 55%. Basal inferior   akinesis. Doppler parameters are consistent with abnormal left   ventricular relaxation (grade 1 diastolic dysfunction). - Aortic valve: There was no stenosis. - Mitral valve: There was mild regurgitation. - Right ventricle: The cavity size was moderately dilated. Systolic   function was normal. - Right atrium: The atrium was mildly dilated. - Atrial septum: Amplatzer atrial septal occluding device is   present. Device appears well-seated without visible leak. - Tricuspid valve: Peak RV-RA gradient (S): 38 mm Hg. - Pulmonary arteries: PA peak pressure: 46 mm Hg (S). - Systemic veins: IVC measured 1.8 cm with < 50% respirophasic   variation, suggesting RA pressure 8 mmHg.  Impressions:  - Normal LV size with EF 55%. Basal inferior akinesis. Moderate   dilated RV with normal systolic function. Amplatzer atrial septal   occluding device present, no significant leak noted. Mild   pulmonary hypertension.  TEE. Findings: Normal  LV size with EF 55-60%. Normal wall thickness. No regional WMAs. The right ventricle was mildly dilated with mildly decreased systolic function. The right atrium was mildly dilated. The left atrium was mildly dilated. There was no LA appendage thrombus. 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 Moderate TR, peak RV-RA gradient 73 mmHg. Mild mitral regurgitation. Trileaflet aortic valve with no stenosis or regurgitation. Normal caliber aorta with grade III-IV plaque in descending thoracic aorta.   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   Right and left cardiac cath.. Conclusion   1. There is a significant left to right shunt at the atrial level, consistent with ASD.  2. Right and left heart filling pressures are not elevated.  3. Mild pulmonary hypertension, PVR is not significantly elevated.  4. Subtotal occlusion of the PDA with left to right collaterals.   I think that he needs the ASD closed. Dr. Excell Seltzer, who performs this procedure, is not here this week. If he remains in the hospital on Monday, I will ask Dr Excell Seltzer to see him. If not, he can followup as an outpatient.   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   Surprisingly, pulmonary hypertension is only mild.     Right and left heart catheterization. 1. There is a significant left to right shunt at the atrial level, consistent with ASD.  2. Right and left heart filling pressures are not elevated.  3. Mild pulmonary hypertension, PVR is not significantly elevated.  4. Subtotal occlusion of the PDA with left to right collaterals.    Discharge Instructions: Discharge Instructions  Diet - low sodium heart healthy    Complete by:  As directed    Diet - low sodium heart healthy    Complete by:  As directed    Discharge instructions    Complete by:  As directed    IT was  pleasure taking care of you. Please take your medicines as directed. We are holding your Diltiazem according to your cardiologist advice, if your experience abnormal palpitations, please seek medical assistance immediately. Please follow up with cardiology according to your appointment.  Please follow up with your PCP within next week. We will call you with your blood results.   Increase activity slowly    Complete by:  As directed    Increase activity slowly    Complete by:  As directed       Signed: [name], MD 01/18/2017, 3:07 PM   Pager: [phone]

## 2017-01-18 NOTE — Progress Notes (Signed)
   Subjective: Patient did not had any complaints, denies any chest pain. Had his ASD percutaneous closure done yesterday. Procedure went very well. No significant events.  Objective:  Vital signs in last 24 hours: Vitals:   01/17/17 1932 01/17/17 2000 01/18/17 0556 01/18/17 0730  BP: 123/75 121/63 (!) 111/59 93/61  Pulse: (!) 102  (!) 111 (!) 112  Resp: 19 (!) 22 (!) 22 15  Temp: 98.1 F (36.7 C)  98.9 F (37.2 C) 98.2 F (36.8 C)  TempSrc: Oral  Oral Oral  SpO2: 100% 100% 100% 96%  Weight: 160 lb 7.9 oz (72.8 kg)  160 lb 7.9 oz (72.8 kg)   Height: 6' (1.829 m)      Gen.Little emaciated man, sitting comfortably in chair, in no acute distress. Chest.Clear bilaterally, no rhonchi or wheeze. CVS. Sinus tachycardia. Abdomen. Soft, nontender,distended, same as yesterday, bowel sounds positive. Extremities.Bilateral  pitting edema up to mid calves, Left more then right.  Labs. CBC Latest Ref Rng & Units 01/18/2017 01/17/2017 01/16/2017  WBC 4.0 - 10.5 K/uL 11.2(H) 11.5(H) 12.5(H)  Hemoglobin 13.0 - 17.0 g/dL 4.0(J9.3(L) 8.1(X9.5(L) 9.1(Y9.9(L)  Hematocrit 39.0 - 52.0 % 26.4(L) 27.2(L) 28.2(L)  Platelets 150 - 400 K/uL 164 183 179   BMP Latest Ref Rng & Units 01/18/2017 01/17/2017 01/16/2017  Glucose 65 - 99 mg/dL 782(N111(H) 562(Z115(H) 308(M117(H)  BUN 6 - 20 mg/dL 10 11 10   Creatinine 0.61 - 1.24 mg/dL 5.78(I0.58(L) 6.96(E0.57(L) 9.520.63  Sodium 135 - 145 mmol/L 134(L) 134(L) 136  Potassium 3.5 - 5.1 mmol/L 3.6 4.2 3.7  Chloride 101 - 111 mmol/L 101 103 104  CO2 22 - 32 mmol/L 25 24 23   Calcium 8.9 - 10.3 mg/dL 8.1(L) 8.2(L) 8.0(L)   INR. 1.23  Assessment/Plan:  Willaim BaneJames Herman a 70 y.o.male.With past medical history significant for paroxysmal atrial fibrillation and [name], is on chronic anticoagulation came to ED with complaint of worsening shortness of breath lightheadedness and dizziness with ambulation,along with bilateral leg swelling and abdominal swellingfor past3 weeks.  Liver  cirrhosis. Clinically the amount of his ascites and lower extremity edema remains unchanged as compared to yesterday. His blood pressure remained stable on midodrine. He was able to tolerate Lasix 40 mg daily and spironolactone 200 mg daily. -Continue current dose of Lasix and spironolactone. -[id number] mutation for hemochromatosis-results are still pending. He will be discharged home today, will need a close follow-up with PCP. His PCP can arrange a GI and hematology consult as an outpatient.  Mild Pulmonary hypertension with history of chronic PE and ASD. Had his ASD percutaneous closure done yesterday. Follow-up echo today was negative for any leakage. He can be discharged home with Cardiology follow-up. -No other medical management needed considering mild pulmonary hypertension at this point.  Macrocytic Anemia. -Continue folic acid. -MMAis still pending.  H/O PAF. Currently sinus rhythm, with tachycardia, rates between 110-120. -Continue anticoagulation. -Keep him off from diltiazem per Dr. Shirlee LatchMcLean from cardiology. -We will arrange a follow-up with him.  Complex cyst on the right kidney.Most likely benign. -Can be followed up as an outpatient.  Dispo: Being discharged today.  Jeremy CourserSumayya Elzabeth Mcquerry, MD 01/18/2017, 11:18 AM Pager: [phone]

## 2017-01-18 NOTE — Progress Notes (Signed)
Internal Medicine Attending:   I saw and examined the patient. I reviewed the resident's note and I agree with the resident's findings and plan as documented in the resident's note.  Patient feels well today with no new complaints. He is status post an ASD closure done by Dr. Excell Seltzerooper yesterday. He does remain tachycardic with a resting heart rate in 100s to 110s which is sinus tachycardia since he has been off his Cardizem. Cardiology follow-up appreciated. We will continue to hold Cardizem for now and attempt to diurese patient with Lasix and spironolactone for his ascites and lower extremity edema likely secondary to his cirrhosis. We'll need to follow up on his C2Y82 mutation for hemochromatosis. Given his elevated transferrin saturation as well as his elevated ferritin this seems to be a likely etiology for his cirrhosis. Patient stable for discharge home today with outpatient follow-up with his primary care doctor. He will need referral to GI for follow-up of cirrhosis as an outpatient.  PT recommends a wheelchair as well as home health PT on discharge. Per case manager note patient is refusing this and does not want either wheelchair or home health PT. Patient to be given Coumadin 3 mg today and resume his home dose from tomorrow. He will need to follow-up for an INR check as outpatient.

## 2017-01-19 LAB — HEMOCHROMATOSIS DNA-PCR(C282Y,H63D)

## 2017-01-20 MED FILL — WARFARIN SODIUM 2 MG TABLET: 2 | 30 days supply | Qty: 40 | Fill #1

## 2017-01-27 ENCOUNTER — Inpatient Hospital Stay (HOSPITAL_COMMUNITY): Admission: RE | Admit: 2017-01-27 | Payer: Medicare Other | Source: Ambulatory Visit

## 2017-02-15 ENCOUNTER — Inpatient Hospital Stay (HOSPITAL_COMMUNITY)
Admission: EM | Admit: 2017-02-15 | Discharge: 2017-03-01 | DRG: 175 | Disposition: A | Discharge status: Skilled Nursing Facility | Payer: Medicare Other | Attending: Internal Medicine | Admitting: Internal Medicine

## 2017-02-15 ENCOUNTER — Emergency Department (HOSPITAL_COMMUNITY): Payer: Medicare Other

## 2017-02-15 ENCOUNTER — Encounter (HOSPITAL_COMMUNITY): Payer: Self-pay | Admitting: Emergency Medicine

## 2017-02-15 DIAGNOSIS — T502X5A Adverse effect of carbonic-anhydrase inhibitors, benzothiadiazides and other diuretics, initial encounter: Secondary | ICD-10-CM | POA: Diagnosis present

## 2017-02-15 DIAGNOSIS — Z79899 Other long term (current) drug therapy: Secondary | ICD-10-CM | POA: Diagnosis not present

## 2017-02-15 DIAGNOSIS — D72829 Elevated white blood cell count, unspecified: Secondary | ICD-10-CM | POA: Diagnosis present

## 2017-02-15 DIAGNOSIS — K5900 Constipation, unspecified: Secondary | ICD-10-CM

## 2017-02-15 DIAGNOSIS — E785 Hyperlipidemia, unspecified: Secondary | ICD-10-CM | POA: Diagnosis present

## 2017-02-15 DIAGNOSIS — I361 Nonrheumatic tricuspid (valve) insufficiency: Secondary | ICD-10-CM | POA: Diagnosis not present

## 2017-02-15 DIAGNOSIS — E440 Moderate protein-calorie malnutrition: Secondary | ICD-10-CM | POA: Diagnosis not present

## 2017-02-15 DIAGNOSIS — I82403 Acute embolism and thrombosis of unspecified deep veins of lower extremity, bilateral: Secondary | ICD-10-CM | POA: Diagnosis present

## 2017-02-15 DIAGNOSIS — I509 Heart failure, unspecified: Secondary | ICD-10-CM | POA: Diagnosis not present

## 2017-02-15 DIAGNOSIS — Z66 Do not resuscitate: Secondary | ICD-10-CM | POA: Diagnosis present

## 2017-02-15 DIAGNOSIS — I5082 Biventricular heart failure: Secondary | ICD-10-CM | POA: Diagnosis present

## 2017-02-15 DIAGNOSIS — I272 Pulmonary hypertension, unspecified: Secondary | ICD-10-CM | POA: Diagnosis present

## 2017-02-15 DIAGNOSIS — I959 Hypotension, unspecified: Secondary | ICD-10-CM | POA: Diagnosis not present

## 2017-02-15 DIAGNOSIS — E860 Dehydration: Secondary | ICD-10-CM | POA: Diagnosis present

## 2017-02-15 DIAGNOSIS — Z8774 Personal history of (corrected) congenital malformations of heart and circulatory system: Secondary | ICD-10-CM

## 2017-02-15 DIAGNOSIS — R0602 Shortness of breath: Secondary | ICD-10-CM | POA: Diagnosis not present

## 2017-02-15 DIAGNOSIS — R140 Abdominal distension (gaseous): Secondary | ICD-10-CM | POA: Diagnosis not present

## 2017-02-15 DIAGNOSIS — R000 Tachycardia, unspecified: Secondary | ICD-10-CM | POA: Diagnosis present

## 2017-02-15 DIAGNOSIS — Z716 Tobacco abuse counseling: Secondary | ICD-10-CM | POA: Diagnosis not present

## 2017-02-15 DIAGNOSIS — K7031 Alcoholic cirrhosis of liver with ascites: Secondary | ICD-10-CM | POA: Diagnosis present

## 2017-02-15 DIAGNOSIS — I10 Essential (primary) hypertension: Secondary | ICD-10-CM

## 2017-02-15 DIAGNOSIS — I2729 Other secondary pulmonary hypertension: Secondary | ICD-10-CM | POA: Diagnosis present

## 2017-02-15 DIAGNOSIS — I480 Paroxysmal atrial fibrillation: Secondary | ICD-10-CM | POA: Diagnosis present

## 2017-02-15 DIAGNOSIS — Z800 Family history of malignant neoplasm of digestive organs: Secondary | ICD-10-CM

## 2017-02-15 DIAGNOSIS — K567 Ileus, unspecified: Secondary | ICD-10-CM

## 2017-02-15 DIAGNOSIS — Z9889 Other specified postprocedural states: Secondary | ICD-10-CM

## 2017-02-15 DIAGNOSIS — I2782 Chronic pulmonary embolism: Secondary | ICD-10-CM | POA: Diagnosis not present

## 2017-02-15 DIAGNOSIS — D638 Anemia in other chronic diseases classified elsewhere: Secondary | ICD-10-CM | POA: Diagnosis present

## 2017-02-15 DIAGNOSIS — Z7901 Long term (current) use of anticoagulants: Secondary | ICD-10-CM

## 2017-02-15 DIAGNOSIS — I50810 Right heart failure, unspecified: Secondary | ICD-10-CM

## 2017-02-15 DIAGNOSIS — R188 Other ascites: Secondary | ICD-10-CM

## 2017-02-15 DIAGNOSIS — I2699 Other pulmonary embolism without acute cor pulmonale: Principal | ICD-10-CM

## 2017-02-15 DIAGNOSIS — R791 Abnormal coagulation profile: Secondary | ICD-10-CM

## 2017-02-15 DIAGNOSIS — R531 Weakness: Secondary | ICD-10-CM

## 2017-02-15 DIAGNOSIS — F17210 Nicotine dependence, cigarettes, uncomplicated: Secondary | ICD-10-CM | POA: Diagnosis present

## 2017-02-15 DIAGNOSIS — R066 Hiccough: Secondary | ICD-10-CM | POA: Diagnosis present

## 2017-02-15 DIAGNOSIS — Z8701 Personal history of pneumonia (recurrent): Secondary | ICD-10-CM | POA: Diagnosis not present

## 2017-02-15 DIAGNOSIS — D696 Thrombocytopenia, unspecified: Secondary | ICD-10-CM | POA: Diagnosis present

## 2017-02-15 DIAGNOSIS — Z515 Encounter for palliative care: Secondary | ICD-10-CM | POA: Diagnosis not present

## 2017-02-15 DIAGNOSIS — Z720 Tobacco use: Secondary | ICD-10-CM | POA: Diagnosis present

## 2017-02-15 DIAGNOSIS — E43 Unspecified severe protein-calorie malnutrition: Secondary | ICD-10-CM | POA: Diagnosis present

## 2017-02-15 DIAGNOSIS — E876 Hypokalemia: Secondary | ICD-10-CM | POA: Diagnosis present

## 2017-02-15 DIAGNOSIS — I4891 Unspecified atrial fibrillation: Secondary | ICD-10-CM | POA: Diagnosis not present

## 2017-02-15 DIAGNOSIS — N179 Acute kidney failure, unspecified: Secondary | ICD-10-CM | POA: Diagnosis present

## 2017-02-15 DIAGNOSIS — Z6820 Body mass index (BMI) 20.0-20.9, adult: Secondary | ICD-10-CM

## 2017-02-15 DIAGNOSIS — M1990 Unspecified osteoarthritis, unspecified site: Secondary | ICD-10-CM | POA: Diagnosis present

## 2017-02-15 DIAGNOSIS — R64 Cachexia: Secondary | ICD-10-CM | POA: Diagnosis present

## 2017-02-15 DIAGNOSIS — I9589 Other hypotension: Secondary | ICD-10-CM | POA: Diagnosis present

## 2017-02-15 DIAGNOSIS — N170 Acute kidney failure with tubular necrosis: Secondary | ICD-10-CM | POA: Diagnosis not present

## 2017-02-15 DIAGNOSIS — K7040 Alcoholic hepatic failure without coma: Secondary | ICD-10-CM | POA: Diagnosis present

## 2017-02-15 LAB — HEPATIC FUNCTION PANEL
ALBUMIN: 2.2 g/dL — AB (ref 3.5–5.0)
ALK PHOS: 100 U/L (ref 38–126)
ALT: 33 U/L (ref 17–63)
AST: 61 U/L — ABNORMAL HIGH (ref 15–41)
BILIRUBIN TOTAL: 3 mg/dL — AB (ref 0.3–1.2)
Bilirubin, Direct: 1.1 mg/dL — ABNORMAL HIGH (ref 0.1–0.5)
Indirect Bilirubin: 1.9 mg/dL — ABNORMAL HIGH (ref 0.3–0.9)
Total Protein: 6.8 g/dL (ref 6.5–8.1)

## 2017-02-15 LAB — BASIC METABOLIC PANEL
Anion gap: 14 (ref 5–15)
BUN: 38 mg/dL — AB (ref 6–20)
CHLORIDE: 101 mmol/L (ref 101–111)
CO2: 22 mmol/L (ref 22–32)
CREATININE: 1.22 mg/dL (ref 0.61–1.24)
Calcium: 8.2 mg/dL — ABNORMAL LOW (ref 8.9–10.3)
GFR calc Af Amer: >60 mL/min (ref >60)
GFR calc non Af Amer: 59 mL/min — ABNORMAL LOW (ref >60)
Glucose, Bld: 112 mg/dL — ABNORMAL HIGH (ref 65–99)
POTASSIUM: 3.9 mmol/L (ref 3.5–5.1)
SODIUM: 137 mmol/L (ref 135–145)

## 2017-02-15 LAB — URINALYSIS, ROUTINE W REFLEX MICROSCOPIC
BACTERIA UA: NONE SEEN
Glucose, UA: NEGATIVE mg/dL
Hgb urine dipstick: NEGATIVE
KETONES UR: NEGATIVE mg/dL
Leukocytes, UA: NEGATIVE
NITRITE: NEGATIVE
PH: 5 (ref 5.0–8.0)
PROTEIN: 30 mg/dL — AB
Specific Gravity, Urine: 1.02 (ref 1.005–1.030)

## 2017-02-15 LAB — DIFFERENTIAL
BASOS ABS: 0 10*3/uL (ref 0.0–0.1)
Basophils Relative: 0 %
EOS PCT: 0 %
Eosinophils Absolute: 0.1 10*3/uL (ref 0.0–0.7)
LYMPHS PCT: 10 %
Lymphs Abs: 2 10*3/uL (ref 0.7–4.0)
MONO ABS: 1.6 10*3/uL — AB (ref 0.1–1.0)
Monocytes Relative: 9 %
NEUTROS PCT: 81 %
Neutro Abs: 15.7 10*3/uL — ABNORMAL HIGH (ref 1.7–7.7)

## 2017-02-15 LAB — PROTIME-INR
INR: 1.31
Prothrombin Time: 16.3 seconds — ABNORMAL HIGH (ref 11.4–15.2)

## 2017-02-15 LAB — BRAIN NATRIURETIC PEPTIDE: B Natriuretic Peptide: 172.1 pg/mL — ABNORMAL HIGH (ref 0.0–100.0)

## 2017-02-15 LAB — CBC
HEMATOCRIT: 36.7 % — AB (ref 39.0–52.0)
Hemoglobin: 12.6 g/dL — ABNORMAL LOW (ref 13.0–17.0)
MCH: 36 pg — AB (ref 26.0–34.0)
MCHC: 34.3 g/dL (ref 30.0–36.0)
MCV: 104.9 fL — AB (ref 78.0–100.0)
Platelets: 145 10*3/uL — ABNORMAL LOW (ref 150–400)
RBC: 3.5 MIL/uL — ABNORMAL LOW (ref 4.22–5.81)
RDW: 18 % — AB (ref 11.5–15.5)
WBC: 19 10*3/uL — AB (ref 4.0–10.5)

## 2017-02-15 LAB — I-STAT TROPONIN, ED: Troponin i, poc: 0 ng/mL (ref 0.00–0.08)

## 2017-02-15 MED ORDER — IOPAMIDOL (ISOVUE-370) INJECTION 76%
INTRAVENOUS | Status: AC
  Administered 2017-02-15: 100 mL
  Filled 2017-02-15: qty 100

## 2017-02-15 MED ORDER — ONDANSETRON HCL 4 MG/2ML IJ SOLN
4.0000 mg | Freq: Three times a day (TID) | INTRAMUSCULAR | Status: DC | PRN
  Administered 2017-02-16 – 2017-02-22 (×4): 4 mg via INTRAVENOUS
  Filled 2017-02-15 (×4): qty 2

## 2017-02-15 MED ORDER — NICOTINE 21 MG/24HR TD PT24
21.0000 mg | MEDICATED_PATCH | Freq: Every day | TRANSDERMAL | Status: DC
  Administered 2017-02-18 – 2017-03-01 (×12): 21 mg via TRANSDERMAL
  Filled 2017-02-15 (×13): qty 1

## 2017-02-15 MED ORDER — SODIUM CHLORIDE 0.9% FLUSH
3.0000 mL | Freq: Two times a day (BID) | INTRAVENOUS | Status: DC
  Administered 2017-02-16 – 2017-03-01 (×22): 3 mL via INTRAVENOUS

## 2017-02-15 MED ORDER — SENNOSIDES-DOCUSATE SODIUM 8.6-50 MG PO TABS
1.0000 | ORAL_TABLET | Freq: Every evening | ORAL | Status: DC | PRN
  Administered 2017-02-20: 1 via ORAL
  Filled 2017-02-15: qty 1

## 2017-02-15 MED ORDER — HEPARIN (PORCINE) IN NACL 100-0.45 UNIT/ML-% IJ SOLN
1700.0000 [IU]/h | INTRAMUSCULAR | Status: DC
  Administered 2017-02-15: 1000 [IU]/h via INTRAVENOUS
  Administered 2017-02-17: 1100 [IU]/h via INTRAVENOUS
  Administered 2017-02-18 – 2017-02-19 (×2): 1250 [IU]/h via INTRAVENOUS
  Administered 2017-02-20: 1350 [IU]/h via INTRAVENOUS
  Administered 2017-02-21: 1500 [IU]/h via INTRAVENOUS
  Filled 2017-02-15 (×7): qty 250

## 2017-02-15 MED ORDER — WARFARIN SODIUM 5 MG PO TABS: 5.0000 mg | ORAL_TABLET | Freq: Once | ORAL | Status: DC

## 2017-02-15 MED ORDER — ZOLPIDEM TARTRATE 5 MG PO TABS
5.0000 mg | ORAL_TABLET | Freq: Every evening | ORAL | Status: DC | PRN
  Administered 2017-02-18: 5 mg via ORAL
  Filled 2017-02-15: qty 1

## 2017-02-15 MED ORDER — ADULT MULTIVITAMIN W/MINERALS CH
1.0000 | ORAL_TABLET | Freq: Every day | ORAL | Status: DC
  Administered 2017-02-16 – 2017-03-01 (×14): 1 via ORAL
  Filled 2017-02-15 (×14): qty 1

## 2017-02-15 MED ORDER — ATORVASTATIN CALCIUM 20 MG PO TABS
20.0000 mg | ORAL_TABLET | Freq: Every day | ORAL | Status: DC
  Administered 2017-02-16 – 2017-02-28 (×12): 20 mg via ORAL
  Filled 2017-02-15 (×13): qty 1

## 2017-02-15 MED ORDER — PANTOPRAZOLE SODIUM 40 MG PO TBEC
40.0000 mg | DELAYED_RELEASE_TABLET | Freq: Every day | ORAL | Status: DC
  Administered 2017-02-16 – 2017-03-01 (×13): 40 mg via ORAL
  Filled 2017-02-15 (×14): qty 1

## 2017-02-15 MED ORDER — WARFARIN - PHARMACIST DOSING INPATIENT: Freq: Every day | Status: DC

## 2017-02-15 MED ORDER — HEPARIN BOLUS VIA INFUSION
4000.0000 [IU] | Freq: Once | INTRAVENOUS | Status: AC
  Administered 2017-02-15: 4000 [IU] via INTRAVENOUS
  Filled 2017-02-15: qty 4000

## 2017-02-15 MED ORDER — VITAMIN B-1 100 MG PO TABS
100.0000 mg | ORAL_TABLET | Freq: Every day | ORAL | Status: DC
  Administered 2017-02-16 – 2017-03-01 (×14): 100 mg via ORAL
  Filled 2017-02-15 (×15): qty 1

## 2017-02-15 MED ORDER — FOLIC ACID 1 MG PO TABS
1.0000 mg | ORAL_TABLET | Freq: Every day | ORAL | Status: DC
  Administered 2017-02-16 – 2017-03-01 (×14): 1 mg via ORAL
  Filled 2017-02-15 (×14): qty 1

## 2017-02-15 MED ORDER — SODIUM CHLORIDE 0.9 % IV BOLUS (SEPSIS)
500.0000 mL | Freq: Once | INTRAVENOUS | Status: AC
  Administered 2017-02-15: 500 mL via INTRAVENOUS

## 2017-02-15 MED ORDER — SODIUM CHLORIDE 0.9 % IV BOLUS (SEPSIS): 1000.0000 mL | Freq: Once | INTRAVENOUS | Status: DC

## 2017-02-15 MED ORDER — MIDODRINE HCL 5 MG PO TABS
10.0000 mg | ORAL_TABLET | Freq: Three times a day (TID) | ORAL | Status: DC
  Administered 2017-02-16 – 2017-03-01 (×39): 10 mg via ORAL
  Filled 2017-02-15 (×43): qty 2

## 2017-02-15 MED ORDER — WARFARIN SODIUM 3 MG PO TABS
3.0000 mg | ORAL_TABLET | ORAL | Status: AC
Start: 2017-02-16 — End: 2017-02-16
  Administered 2017-02-16: 3 mg via ORAL
  Filled 2017-02-15: qty 1

## 2017-02-15 NOTE — ED Provider Notes (Signed)
[facility]-EMERGENCY DEPT Provider Note   CSN: [id number] Arrival date & time: 02/15/17  1602     History   Chief Complaint Chief Complaint  Patient presents with  . Weakness  . Heartburn  . Shortness of Breath    HPI Jeremy Herman is a 71 y.o. male.  HPI   72 year Old male who presents with generalized weakness and shortness of breath. He has a history of DVT and PE on Coumadin, alcoholic cirrhosis complicated by ascites, PAF, ASD status post percutaneous closure, and RV failure with pulmonary hypertension. Patient states that he was admitted in February 2018 for shortness of breath and fluid overload. States that initially felt well the first week after discharge, but gradually had worsening shortness of breath, generalized weakness. Endorses associating decreased appetite and weight loss. As noted persistent abdominal distention not relieved by home Lasix, and is unaware that he has had a diagnosis of cirrhosis. No fevers, chills, cough, vomiting, diarrhea, melena or hematochezia, or urinary complaints. No chest pain. No LE edema. No abdominal pain.    Past Medical History:  Diagnosis Date  . Arthritis    "right knee" (01/17/2017)  . Chronic anticoagulation   . DVT (deep venous thrombosis) (HCC) 2015; 2016   RLE; LLE  . Paroxysmal atrial fibrillation (HCC)    Hattie Perch 01/17/2017  . PE (pulmonary embolism) 2015; 2016  . Pneumonia 1969    Patient Active Problem List   Diagnosis Date Noted  . PE (pulmonary thromboembolism) (HCC) 02/15/2017  . HLD (hyperlipidemia) 02/15/2017  . Pressure injury of skin 01/17/2017  . Malnutrition of moderate degree 01/16/2017  . Pulmonary hypertension, primary (HCC)   . RVF (right ventricular failure) (HCC)   . Ascites due to alcoholic cirrhosis (HCC)   . Atrial septal defect, secundum   . SOB (shortness of breath) 01/11/2017  . Pulmonary embolus (HCC)   . Other ascites   . Shortness of breath   . Chronic pulmonary embolism (HCC) 01/10/2017  .  Ascites 01/10/2017  . Macrocytic anemia 12/13/2016  . Pulmonary hypertension 12/09/2016  . Bilateral chronic knee pain 12/09/2016  . Tremor 12/09/2016  . Fracture, intertrochanteric, left femur (HCC) 04/21/2016  . Femur fracture (HCC) 04/20/2016  . Femur fracture, left (HCC) 04/20/2016  . Palpitations 02/09/2016  . Left hip pain   . Hypokalemia   . Megaloblastic anemia   . Thrombocytopenia (HCC)   . Hip fracture (HCC) 10/24/2015  . Tobacco abuse 10/24/2015  . Chronic anticoagulation 10/24/2015  . Hypertension 10/24/2015  . Hip fracture, right (HCC) 10/24/2015  . DVT, lower extremity (HCC)   . Smoker 07/08/2014  . 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 Bilateral pulmonary embolism (HCC) 09/17/2013  . DVT, bilateral lower limbs (HCC) 09/17/2013    Past Surgical History:  Procedure Laterality Date  . ANKLE FRACTURE SURGERY Right 1979   "skiing accident"  . ASD REPAIR  01/17/2017  . ATRIAL SEPTAL DEFECT(ASD) CLOSURE N/A 01/17/2017   Procedure: Atrial Septal Defect(ASD) Closure;  Surgeon: Tonny Bollman, MD;  Location: [facility] INVASIVE CV LAB;  Service: Cardiovascular;  Laterality: N/A;  . CARDIAC CATHETERIZATION  01/13/2017  . FEMUR IM NAIL Left 04/21/2016   Procedure: INTRAMEDULLARY (IM) NAIL LEFT  FEMORAL;  Surgeon: Samson Frederic, MD;  Location: [facility] ORS;  Service: Orthopedics;  Laterality: Left;  . FRACTURE SURGERY    . RIGHT/LEFT HEART CATH AND CORONARY ANGIOGRAPHY N/A 01/13/2017   Procedure: Right/Left Heart Cath and Coronary Angiography;  Surgeon: Laurey Morale, MD;  Location: [facility] INVASIVE CV LAB;  Service: Cardiovascular;  Laterality: N/A;  . TEE WITHOUT CARDIOVERSION N/A 01/13/2017   Procedure: TRANSESOPHAGEAL ECHOCARDIOGRAM (TEE);  Surgeon: Laurey Morale, MD;  Location: [facility] ENDOSCOPY;  Service: Cardiovascular;  Laterality: N/A;       Home Medications    Prior to Admission medications   Medication Sig Start Date End Date Taking? Authorizing Provider  atorvastatin (LIPITOR) 20 MG  tablet Take 1 tablet (20 mg total) by mouth daily at 6 PM. 01/18/17  Yes [name], MD  furosemide (LASIX) 40 MG tablet Take 1 tablet (40 mg total) by mouth daily. 01/19/17  Yes [name], MD  midodrine (PROAMATINE) 10 MG tablet Take 1 tablet (10 mg total) by mouth 3 (three) times daily with meals. Patient taking differently: Take 10 mg by mouth daily. TAKES WITH MEALS BUT USUALLY ONLY EATS ONCE DAILY 01/18/17  Yes [name], MD  senna-docusate (SENOKOT-S) 8.6-50 MG tablet Take 1 tablet by mouth at bedtime as needed for mild constipation. 01/18/17  Yes [name], MD  spironolactone (ALDACTONE) 100 MG tablet Take 2 tablets (200 mg total) by mouth daily. 01/19/17  Yes [name], MD  warfarin (COUMADIN) 2 MG tablet Take 1 tablet by mouth every day but 2 tablets on Tuesdays and Saturdays. Patient taking differently: Take 1-2 mg by mouth See admin instructions. 1mg  On Tuesday, Thursday, Saturday - pt takes 2mg  on Sunday, Monday, Wednesday, Friday 12/09/16  Yes Josalyn Funches, MD  feeding supplement, ENSURE ENLIVE, (ENSURE ENLIVE) LIQD Take 237 mLs by mouth 2 (two) times daily between meals. Patient not taking: Reported on 02/15/2017 01/18/17   [name], MD  folic acid (FOLVITE) 1 MG tablet Take 1 tablet (1 mg total) by mouth daily. Patient not taking: Reported on 01/10/2017 12/13/16   Dessa Phi, MD  Multiple Vitamins-Minerals (MULTIVITAMIN) tablet Take 1 tablet by mouth daily. Patient not taking: Reported on 01/10/2017 12/13/16   Dessa Phi, MD  thiamine (VITAMIN B-1) 100 MG tablet Take 1 tablet (100 mg total) by mouth daily. Patient not taking: Reported on 01/10/2017 12/13/16   Dessa Phi, MD  traMADol (ULTRAM) 50 MG tablet Take 1 tablet (50 mg total) by mouth every 12 (twelve) hours as needed. Patient not taking: Reported on 02/15/2017 12/09/16   Dessa Phi, MD    Family History Family History  Problem Relation Age of Onset  . Stomach cancer Father   . Cancer Mother   .  Aneurysm Mother   . Deep vein thrombosis Brother 42    Social History Social History  Substance Use Topics  . Smoking status: Current Every Day Smoker    Packs/day: 1.00    Years: 48.00    Types: Cigarettes  . Smokeless tobacco: Never Used  . Alcohol use No     Comment: Former extensive use; "Quit  in 2001" (01/17/2017)     Allergies   Fish allergy   Review of Systems Review of Systems 10/14 systems reviewed and are negative other than those stated in the HPI   Physical Exam Updated Vital Signs BP 109/78   Pulse (!) 120   Temp 97.6 F (36.4 C) (Oral)   Resp 19   Wt 142 lb (64.4 kg)   SpO2 100%   BMI 19.26 kg/m   Physical Exam Physical Exam  Nursing note and vitals reviewed. Constitutional: Chronically ill-appearing, cachectic and malnourished appearing, non-toxic, and in no acute distress Head: Normocephalic and atraumatic.  Mouth/Throat: Oropharynx is clear and dry mucous membranes.  Neck: Normal range of motion. Neck  supple.  Cardiovascular: Tachycardic rate and regular rhythm.   no significant lower extremity edema Pulmonary/Chest: Effort increased, speaks in full sentences, and breath sounds normal.  Abdominal: Soft. Significant abdominal distention. There is no tenderness. There is no rebound and no guarding.  Musculoskeletal: Normal range of motion.  Neurological: Alert, no facial droop, fluent speech, moves all extremities symmetrically Skin: Skin is warm and dry.  Psychiatric: Cooperative   ED Treatments / Results  Labs (all labs ordered are listed, but only abnormal results are displayed) Labs Reviewed  BASIC METABOLIC PANEL - Abnormal; Notable for the following:       Result Value   Glucose, Bld 112 (*)    BUN 38 (*)    Calcium 8.2 (*)    GFR calc non Af Amer 59 (*)    All other components within normal limits  CBC - Abnormal; Notable for the following:    WBC 19.0 (*)    RBC 3.50 (*)    Hemoglobin 12.6 (*)    HCT 36.7 (*)    MCV 104.9  (*)    MCH 36.0 (*)    RDW 18.0 (*)    Platelets 145 (*)    All other components within normal limits  DIFFERENTIAL - Abnormal; Notable for the following:    Neutro Abs 15.7 (*)    Monocytes Absolute 1.6 (*)    All other components within normal limits  PROTIME-INR - Abnormal; Notable for the following:    Prothrombin Time 16.3 (*)    All other components within normal limits  BRAIN NATRIURETIC PEPTIDE - Abnormal; Notable for the following:    B Natriuretic Peptide 172.1 (*)    All other components within normal limits  HEPATIC FUNCTION PANEL - Abnormal; Notable for the following:    Albumin 2.2 (*)    AST 61 (*)    Total Bilirubin 3.0 (*)    Bilirubin, Direct 1.1 (*)    Indirect Bilirubin 1.9 (*)    All other components within normal limits  URINALYSIS, ROUTINE W REFLEX MICROSCOPIC  HEPARIN LEVEL (UNFRACTIONATED)  CBC  I-STAT TROPOININ, ED    EKG  EKG Interpretation  Date/Time:  Wednesday February 15 2017 16:10:08 EDT Ventricular Rate:  122 PR Interval:    QRS Duration: 79 QT Interval:  328 QTC Calculation: 468 R Axis:   78 Text Interpretation:  Sinus tachycardia Atrial premature complexes Low voltage, extremity leads Baseline wander in lead(s) V1 similar to prior EKG  Confirmed by Erricka Falkner MD, Rondal Vandevelde [phone]) on 02/15/2017 4:15:43 PM       Radiology Dg Chest 2 View  Result Date: 02/15/2017 CLINICAL DATA:  Shortness of breath for 4 weeks EXAM: CHEST  2 VIEW COMPARISON:  01/11/2017 FINDINGS: There are low lung volumes. Subsegmental atelectasis in the right mid lung. No acute infiltrate or effusion. Heart size within normal limits. New ASD closure device. Atherosclerosis. No pneumothorax. IMPRESSION: Low lung volumes with subsegmental atelectasis in the right mid lung. No acute infiltrate or edema. Electronically Signed   By: Jasmine PangKim  Fujinaga M.D.   On: 02/15/2017 16:51   Ct Angio Chest Pe W And/or Wo Contrast  Result Date: 02/15/2017 CLINICAL DATA:  71 year old male with shortness  of breath. History of pulmonary embolism. EXAM: CT ANGIOGRAPHY CHEST WITH CONTRAST TECHNIQUE: Multidetector CT imaging of the chest was performed using the standard protocol during bolus administration of intravenous contrast. Multiplanar CT image reconstructions and MIPs were obtained to evaluate the vascular anatomy. CONTRAST:  100 cc Isovue 370 COMPARISON:  Chest CT dated 11/09/2017 and 07/08/2014 FINDINGS: Cardiovascular: There is no cardiomegaly or pericardial effusion. Advanced multi vessel coronary vascular calcification involving the LAD, left circumflex artery, and right RCA. There is moderate atherosclerotic calcification of the thoracic aorta. No aneurysmal dilatation or evidence of dissection. There is non opacification of the visualized proximal left vertebral artery. This is of indeterminate chronicity. Correlation with clinical exam is recommended. CT angiography of the neck is recommended if there is clinical concern for acute left vertebral artery occlusion. There is dilatation of the main pulmonary artery suggestive of underlying pulmonary hypertension. Eccentric clot noted along the walls of the left pulmonary artery extending to the lobar and segmental branches of the left lower lobe. There is interval increase in the clot burden compared to the prior CT of 01/10/2017. There is associated luminal narrowing of the the lobar and segmental branches of the left lower lobe pulmonary arteries without definite evidence of complete occlusion. There is no CT evidence of right heart straining. Mediastinum/Nodes: No hilar or mediastinal adenopathy. The esophagus and the thyroid gland are grossly unremarkable. Lungs/Pleura: Right lung base linear atelectasis/ scarring noted. There is no focal consolidation, pleural effusion, or pneumothorax. The central airways are patent. Upper Abdomen: There is irregularity of the hepatic contour concerning for underlying cirrhosis. Partially visualized moderate size  ascites in the upper abdomen has increased compared to the prior CT. Musculoskeletal: Osteopenia with degenerative changes of the spine. No acute fracture. There is loss of subcutaneous fat and cachexia. Review of the MIP images confirms the above findings. IMPRESSION: 1. Eccentric clot along the periphery of the left pulmonary artery extending into the lobar and segmental branches of the left lower lobe. There has been interval increase in the clot burden compared to the prior CT with associated moderate luminal narrowing. No definite occlusive thrombus/embolus identified. 2. Advanced coronary vascular calcification. 3. Morphologic changes of cirrhosis with partially visualized moderate upper abdominal ascites, increased since the prior CT. These results were called by telephone at the time of interpretation on 02/15/2017 at 10:48 pm to Dr. Crista Curb , who verbally acknowledged these results. Electronically Signed   By: Elgie Collard M.D.   On: 02/15/2017 22:51    Procedures Procedures (including critical care time) CRITICAL CARE Performed by: Lavera Guise   Total critical care time: 35 minutes  Critical care time was exclusive of separately billable procedures and treating other patients.  Critical care was necessary to treat or prevent imminent or life-threatening deterioration.  Critical care was time spent personally by me on the following activities: development of treatment plan with patient and/or surrogate as well as nursing, discussions with consultants, evaluation of patient's response to treatment, examination of patient, obtaining history from patient or surrogate, ordering and performing treatments and interventions, ordering and review of laboratory studies, ordering and review of radiographic studies, pulse oximetry and re-evaluation of patient's condition.  Medications Ordered in ED Medications  heparin bolus via infusion 4,000 Units (not administered)  heparin ADULT infusion  100 units/mL (25000 units/279mL sodium chloride 0.45%) (1,000 Units/hr Intravenous New Bag/Given 02/15/17 2318)  sodium chloride 0.9 % bolus 500 mL (not administered)  sodium chloride 0.9 % bolus 500 mL (0 mLs Intravenous Stopped 02/15/17 1934)  iopamidol (ISOVUE-370) 76 % injection (100 mLs  Contrast Given 02/15/17 2158)     Initial Impression / Assessment and Plan / ED Course  I have reviewed the triage vital signs and the nursing notes.  Pertinent labs & imaging results that were available  during my care of the patient were reviewed by me and considered in my medical decision making (see chart for details).     I reviewed the available records in [facility] and also Care Everywhere, updated the chart. Admitted in 12/2016 for fluid overload, shortness of breath for 3 weeks. Fluid overload related to liver cirrhosis s/p paracentesis. Also had ASD repair by cardiology.  Today with ascites, but looks more dry rather than fluid overloaded. tachycardic, but normotensive. tachypnea but no hypoxia. Blood work showing hemoconcentration, and AKI. Given some IVF.  CXR visualized and shows no major acute cardiopulmonary processes such as pneumonia or edema. CT chest performed given subtherapeutic inr and concern for worsening PE burden. CT chest visualized and reviewed with radiology. There is evidence of increasing PE burden that is likely causing his shortness of breath. Started on a heparin drip. Shortness of breath also likely related to ascites potentially and he may benefit from a higher paracentesis. No significant abdominal pain or concerns for SBP at this time.  Discussed with Dr. Clyde Lundborg, who will admit for ongoing management.    Final Clinical Impressions(s) / ED Diagnoses   Final diagnoses:  Other acute pulmonary embolism without acute cor pulmonale (HCC)  Generalized weakness  Subtherapeutic international normalized ratio (INR)    New Prescriptions New Prescriptions   No medications on file      Lavera Guise, MD 02/15/17 2326

## 2017-02-15 NOTE — ED Notes (Signed)
Pt in CT.

## 2017-02-15 NOTE — H&P (Addendum)
History and Physical    Jeremy Herman WUJ:[id number] DOB: Nov 15, 1946 DOA: 02/15/2017  Referring MD/NP/PA:   PCP: Lora Paula, MD   Patient coming from:  The patient is coming from home.  At baseline, pt is independent for most of ADL.  Chief Complaint: Worsening shortness of breath, worsening abdominal distention  HPI: Jeremy Herman is a 71 y.o. male with medical history significant of atrial fibrillation and DVT/PE on Coumadin, hyperlipidemia, tobacco abuse, right heart failure with pulmonary hypertension, alcoholic cirrhosis with ascites, ASD (s/p of repair), who presents with worsening shortness breath and worsening abdominal distention.  Patient state that he has shortness of breath for almost a month, which has worsened in the past several days. He has some mild dry cough, but no chest pain, fever or chills. He states that his abdominal distention has worsened recently, denies abdominal pain, diarrhea. He states that he has nausea and vomited once last night. Currently has nausea, but no vomiting. He denies symptoms of UTI, unilateral weakness. He states that he has been compliant to taking Coumadin (but INR 1.01 today). Patient states that he did not drink alcohol since January 2018.  ED Course: pt was found to have subtherapeutic INR 1.31, negative troponin, BNP 172, negative urinalysis, mild acute renal injury with creatinine 1.22, WBC 19.0, temperature 97.6, soft blood pressure, tachycardia, tachypnea, oxygen saturation 92% on nasal cannula oxygen. Pt is admitted to tele bed as inpt  # CT angiogram of chest showed interval increase in the clot burden compared to the prior CT with associated moderate luminal narrowing. No definite occlusive thrombus/embolus identified. Also showed increased ascites.  Review of Systems:   General: no fevers, chills, no changes in body weight, has poor appetite, has fatigue HEENT: no blurry vision, hearing changes or sore throat Respiratory: has  dyspnea, coughing, no wheezing CV: no chest pain, no palpitations GI: has nausea, vomiting, no abdominal pain, diarrhea, constipation. Has abdominal distention  GU: no dysuria, burning on urination, increased urinary frequency, hematuria  Ext: no leg edema Neuro: no unilateral weakness, numbness, or tingling, no vision change or hearing loss Skin: no rash, no skin tear. MSK: No muscle spasm, no deformity, no limitation of range of movement in spin Heme: No easy bruising.  Travel history: No recent long distant travel.  Allergy:  Allergies  Allergen Reactions  . Fish Allergy Anaphylaxis and Other (See Comments)    Diarrhea     Past Medical History:  Diagnosis Date  . Arthritis    "right knee" (01/17/2017)  . Chronic anticoagulation   . DVT (deep venous thrombosis) (HCC) 2015; 2016   RLE; LLE  . Paroxysmal atrial fibrillation (HCC)    Hattie Perch 01/17/2017  . PE (pulmonary embolism) 2015; 2016  . Pneumonia 1969    Past Surgical History:  Procedure Laterality Date  . ANKLE FRACTURE SURGERY Right 1979   "skiing accident"  . ASD REPAIR  01/17/2017  . ATRIAL SEPTAL DEFECT(ASD) CLOSURE N/A 01/17/2017   Procedure: Atrial Septal Defect(ASD) Closure;  Surgeon: Tonny Bollman, MD;  Location: [facility];  Service: Cardiovascular;  Laterality: N/A;  . CARDIAC CATHETERIZATION  01/13/2017  . FEMUR IM NAIL Left 04/21/2016   Procedure: INTRAMEDULLARY (IM) NAIL LEFT  FEMORAL;  Surgeon: Samson Frederic, MD;  Location: [facility];  Service: Orthopedics;  Laterality: Left;  . FRACTURE SURGERY    . RIGHT/LEFT HEART CATH AND CORONARY ANGIOGRAPHY N/A 01/13/2017   Procedure: Right/Left Heart Cath and Coronary Angiography;  Surgeon: Laurey Morale, MD;  Location: [facility] CV [facility];  Service: Cardiovascular;  Laterality: N/A;  . TEE WITHOUT CARDIOVERSION N/A 01/13/2017   Procedure: TRANSESOPHAGEAL ECHOCARDIOGRAM (TEE);  Surgeon: Laurey Moralealton S McLean, MD;  [facility];  Service: Cardiovascular;   Laterality: N/A;    Social History:  reports that he has been smoking Cigarettes.  He has a 48.00 pack-year smoking history. He has never used smokeless tobacco. He reports that he does not drink alcohol or use drugs.  Family History:  Family History  Problem Relation Age of Onset  . Stomach cancer Father   . Cancer Mother   . Aneurysm Mother   . Deep vein thrombosis Brother 67     Prior to Admission medications   Medication Sig Start Date End Date Taking? Authorizing Provider  atorvastatin (LIPITOR) 20 MG tablet Take 1 tablet (20 mg total) by mouth daily at 6 PM. 01/18/17  Yes Arnetha CourserSumayya Amin, MD  furosemide (LASIX) 40 MG tablet Take 1 tablet (40 mg total) by mouth daily. 01/19/17  Yes Arnetha CourserSumayya Amin, MD  midodrine (PROAMATINE) 10 MG tablet Take 1 tablet (10 mg total) by mouth 3 (three) times daily with meals. Patient taking differently: Take 10 mg by mouth daily. TAKES WITH MEALS BUT USUALLY ONLY EATS ONCE DAILY 01/18/17  Yes Arnetha CourserSumayya Amin, MD  senna-docusate (SENOKOT-S) 8.6-50 MG tablet Take 1 tablet by mouth at bedtime as needed for mild constipation. 01/18/17  Yes Arnetha CourserSumayya Amin, MD  spironolactone (ALDACTONE) 100 MG tablet Take 2 tablets (200 mg total) by mouth daily. 01/19/17  Yes Arnetha CourserSumayya Amin, MD  warfarin (COUMADIN) 2 MG tablet Take 1 tablet by mouth every day but 2 tablets on Tuesdays and Saturdays. Patient taking differently: Take 1-2 mg by mouth See admin instructions. 1mg  On Tuesday, Thursday, Saturday - pt takes 2mg  on Sunday, Monday, Wednesday, Friday 12/09/16  Yes Josalyn Funches, MD  feeding supplement, ENSURE ENLIVE, (ENSURE ENLIVE) LIQD Take 237 mLs by mouth 2 (two) times daily between meals. Patient not taking: Reported on 02/15/2017 01/18/17   Arnetha CourserSumayya Amin, MD  folic acid (FOLVITE) 1 MG tablet Take 1 tablet (1 mg total) by mouth daily. Patient not taking: Reported on 01/10/2017 12/13/16   Dessa PhiJosalyn Funches, MD  Multiple Vitamins-Minerals (MULTIVITAMIN) tablet Take 1 tablet by mouth  daily. Patient not taking: Reported on 01/10/2017 12/13/16   Dessa PhiJosalyn Funches, MD  thiamine (VITAMIN B-1) 100 MG tablet Take 1 tablet (100 mg total) by mouth daily. Patient not taking: Reported on 01/10/2017 12/13/16   Dessa PhiJosalyn Funches, MD  traMADol (ULTRAM) 50 MG tablet Take 1 tablet (50 mg total) by mouth every 12 (twelve) hours as needed. Patient not taking: Reported on 02/15/2017 12/09/16   Dessa PhiJosalyn Funches, MD    Physical Exam: Vitals:   02/15/17 2247 02/15/17 2300 02/16/17 0033 02/16/17 0515  BP:  109/78 113/61 110/75  Pulse:   (!) 56 66  Resp:  19 18 18   Temp: 97.6 F (36.4 C)  97.7 F (36.5 C) 98.6 F (37 C)  TempSrc: Oral  Oral Oral  SpO2:   100% 100%  Weight:   66.4 kg (146 lb 6.4 oz)   Height:   6' (1.829 m)    General: Not in acute distress HEENT:       Eyes: PERRL, EOMI, no scleral icterus.       ENT: No discharge from the ears and nose, no pharynx injection, no tonsillar enlargement.        Neck: No JVD, no bruit, no mass felt. Heme: No neck  lymph node enlargement. Cardiac: S1/S2, RRR, No murmurs, No gallops or rubs. Respiratory: No rales, wheezing, rhonchi or rubs. GI: distended, nontender, no rebound pain, no organomegaly, BS present. GU: No hematuria Ext: No pitting leg edema bilaterally. 2+DP/PT pulse bilaterally. Musculoskeletal: No joint deformities, No joint redness or warmth, no limitation of ROM in spin. Skin: No rashes.  Neuro: Alert, oriented X3, cranial nerves II-XII grossly intact, moves all extremities normally.   Psych: Patient is not psychotic, no suicidal or hemocidal ideation.  Labs on Admission: I have personally reviewed following labs and imaging studies  CBC:  Recent Labs Lab 02/15/17 1615  WBC 19.0*  NEUTROABS 15.7*  HGB 12.6*  HCT 36.7*  MCV 104.9*  PLT 145*   Basic Metabolic Panel:  Recent Labs Lab 02/15/17 1615  NA 137  K 3.9  CL 101  CO2 22  GLUCOSE 112*  BUN 38*  CREATININE 1.22  CALCIUM 8.2*   GFR: Estimated  Creatinine Clearance: 53.7 mL/min (by C-G formula based on SCr of 1.22 mg/dL). Liver Function Tests:  Recent Labs Lab 02/15/17 1615  AST 61*  ALT 33  ALKPHOS 100  BILITOT 3.0*  PROT 6.8  ALBUMIN 2.2*   No results for input(s): LIPASE, AMYLASE in the last 168 hours.  Recent Labs Lab 02/16/17 0217  AMMONIA 36*   Coagulation Profile:  Recent Labs Lab 02/15/17 1615 02/16/17 0217  INR 1.31 1.55   Cardiac Enzymes: No results for input(s): CKTOTAL, CKMB, CKMBINDEX, TROPONINI in the last 168 hours. BNP (last 3 results) No results for input(s): PROBNP in the last 8760 hours. HbA1C: No results for input(s): HGBA1C in the last 72 hours. CBG: No results for input(s): GLUCAP in the last 168 hours. Lipid Profile: No results for input(s): CHOL, HDL, LDLCALC, TRIG, CHOLHDL, LDLDIRECT in the last 72 hours. Thyroid Function Tests: No results for input(s): TSH, T4TOTAL, FREET4, T3FREE, THYROIDAB in the last 72 hours. Anemia Panel: No results for input(s): VITAMINB12, FOLATE, FERRITIN, TIBC, IRON, RETICCTPCT in the last 72 hours. Urine analysis:    Component Value Date/Time   COLORURINE AMBER (A) 02/15/2017 2233   APPEARANCEUR HAZY (A) 02/15/2017 2233   LABSPEC 1.020 02/15/2017 2233   PHURINE 5.0 02/15/2017 2233   GLUCOSEU NEGATIVE 02/15/2017 2233   HGBUR NEGATIVE 02/15/2017 2233   BILIRUBINUR SMALL (A) 02/15/2017 2233   KETONESUR NEGATIVE 02/15/2017 2233   PROTEINUR 30 (A) 02/15/2017 2233   UROBILINOGEN 1.0 12/26/2010 0531   NITRITE NEGATIVE 02/15/2017 2233   LEUKOCYTESUR NEGATIVE 02/15/2017 2233   Sepsis Labs: @LABRCNTIP (procalcitonin:4,lacticidven:4) )No results found for this or any previous visit (from the past 240 hour(s)).   Radiological Exams on Admission: Dg Chest 2 View  Result Date: 02/15/2017 CLINICAL DATA:  Shortness of breath for 4 weeks EXAM: CHEST  2 VIEW COMPARISON:  01/11/2017 FINDINGS: There are low lung volumes. Subsegmental atelectasis in the right  mid lung. No acute infiltrate or effusion. Heart size within normal limits. New ASD closure device. Atherosclerosis. No pneumothorax. IMPRESSION: Low lung volumes with subsegmental atelectasis in the right mid lung. No acute infiltrate or edema. Electronically Signed   By: Jasmine Pang M.D.   On: 02/15/2017 16:51   Ct Angio Chest Pe W And/or Wo Contrast  Result Date: 02/15/2017 CLINICAL DATA:  71 year old male with shortness of breath. History of pulmonary embolism. EXAM: CT ANGIOGRAPHY CHEST WITH CONTRAST TECHNIQUE: Multidetector CT imaging of the chest was performed using the standard protocol during bolus administration of intravenous contrast. Multiplanar CT image reconstructions and MIPs  were obtained to evaluate the vascular anatomy. CONTRAST:  100 cc Isovue 370 COMPARISON:  Chest CT dated 11/09/2017 and 07/08/2014 FINDINGS: Cardiovascular: There is no cardiomegaly or pericardial effusion. Advanced multi vessel coronary vascular calcification involving the LAD, left circumflex artery, and right RCA. There is moderate atherosclerotic calcification of the thoracic aorta. No aneurysmal dilatation or evidence of dissection. There is non opacification of the visualized proximal left vertebral artery. This is of indeterminate chronicity. Correlation with clinical exam is recommended. CT angiography of the neck is recommended if there is clinical concern for acute left vertebral artery occlusion. There is dilatation of the main pulmonary artery suggestive of underlying pulmonary hypertension. Eccentric clot noted along the walls of the left pulmonary artery extending to the lobar and segmental branches of the left lower lobe. There is interval increase in the clot burden compared to the prior CT of 01/10/2017. There is associated luminal narrowing of the the lobar and segmental branches of the left lower lobe pulmonary arteries without definite evidence of complete occlusion. There is no CT evidence of right  heart straining. Mediastinum/Nodes: No hilar or mediastinal adenopathy. The esophagus and the thyroid gland are grossly unremarkable. Lungs/Pleura: Right lung base linear atelectasis/ scarring noted. There is no focal consolidation, pleural effusion, or pneumothorax. The central airways are patent. Upper Abdomen: There is irregularity of the hepatic contour concerning for underlying cirrhosis. Partially visualized moderate size ascites in the upper abdomen has increased compared to the prior CT. Musculoskeletal: Osteopenia with degenerative changes of the spine. No acute fracture. There is loss of subcutaneous fat and cachexia. Review of the MIP images confirms the above findings. IMPRESSION: 1. Eccentric clot along the periphery of the left pulmonary artery extending into the lobar and segmental branches of the left lower lobe. There has been interval increase in the clot burden compared to the prior CT with associated moderate luminal narrowing. No definite occlusive thrombus/embolus identified. 2. Advanced coronary vascular calcification. 3. Morphologic changes of cirrhosis with partially visualized moderate upper abdominal ascites, increased since the prior CT. These results were called by telephone at the time of interpretation on 02/15/2017 at 10:48 pm to Dr. Crista Curb , who verbally acknowledged these results. Electronically Signed   By: Elgie Collard M.D.   On: 02/15/2017 22:51     EKG: Independently reviewed.  Sinus rhythm, QTC 468, low voltage, poor R-wave progression, PAC. Assessment/Plan Principal Problem:   Pulmonary embolus (HCC) Active Problems:   DVT, bilateral lower limbs (HCC)   Tobacco abuse   Pulmonary hypertension   Ascites   RVF (right ventricular failure) (HCC)   Ascites due to alcoholic cirrhosis (HCC)   Malnutrition of moderate degree   PE (pulmonary thromboembolism) (HCC)   HLD (hyperlipidemia)   Atrial fibrillation with RVR (HCC)   Pulmonary embolus and DVT: CT  angiogram of chest showed interval increase in the clot burden, no definite occlusive thrombus/embolus identified. No evidence of right heart straining. Patient states that he has been compliant to Coumadin, but his INR is subtherapeutic 1.31. Patient is hemodynamically stable.  -will admit to tele bed as inpt.  -start IV heparin and continue coumadin per pharm -prn xopenex nebs for SOB -prn Mucinex for cough  AKI: Cre 1.22. Likely due to prerenal secondary to dehydration and continuation of diruetics - gentle IVF: NS 500 ml x 2 were given in ED - Follow up renal function by BMP - Hold Lasix and spironolactone  Ascites due to alcoholic cirrhosis: Patient did not drink alcohol since  January. AST and ALT normal. Total bilirubin 1.9. Patient does not have abdominal pain or fever. No signs of SBP. Patient has worsenin shortness of breath, which may be partially from worsening ascites and abdominal distention. Patient wanted to have a paracentesis, but he is on coumadin and IV heparin, will be difficult. -continue diurectics when renal fx improves -check ammonia level  Atrial Fibrillation with RVR: CHA2DS2-VASc Score is 3, needs oral anticoagulation. Patient is on Coumadin at home. INR is 1.31 on admission. Heart rate is up to 120. -continue coumadin as above -add prn metoprolol by IV   Tobacco abuse: -Did counseling about importance of quitting smoking -Nicotine patch  Pulmonary hypertension and R ventricular failure: The patient does not have leg edema. Heart failure seems to be compensated. -Hold diuretics as above due to acute renal injury  Malnutrition of moderate degree: -Nutrition consult  HLD: Last LDL was 77 on 12/25/15  -Continue home medications: Lipitor   DVT ppx: on IV Heparin and coumadin Code Status: Full code Family Communication: None at bed side.   Disposition Plan:  Anticipate discharge back to previous home environment Consults called:  none Admission status:  Inpatient/tele   Date of Service 02/16/2017    Lorretta Harp [facility] Pager [phone]  If 7PM-7AM, please contact night-coverage [email or web address] Password [facility] 02/16/2017, 5:55 AM

## 2017-02-15 NOTE — ED Notes (Signed)
Attempted report 

## 2017-02-15 NOTE — ED Notes (Signed)
Pt pulled up in bed and repositioned for comfort.  Denies any needs at this time.

## 2017-02-15 NOTE — Progress Notes (Addendum)
ANTICOAGULATION CONSULT NOTE - Initial Consult  Pharmacy Consult for Heparin and Coumadin Indication: pulmonary embolus  Allergies  Allergen Reactions  . Fish Allergy Anaphylaxis and Other (See Comments)    Diarrhea     Patient Measurements: Weight: 142 lb (64.4 kg)  Vital Signs: Temp: 97.6 F (36.4 C) (03/21 2247) Temp Source: Oral (03/21 2247) BP: 104/71 (03/21 2245) Pulse Rate: 120 (03/21 2115)  Labs:  Recent Labs  02/15/17 1615  HGB 12.6*  HCT 36.7*  PLT 145*  LABPROT 16.3*  INR 1.31  CREATININE 1.22    Estimated Creatinine Clearance: 52.1 mL/min (by C-G formula based on SCr of 1.22 mg/dL).   Medical History: Past Medical History:  Diagnosis Date  . Arthritis    "right knee" (01/17/2017)  . Chronic anticoagulation   . DVT (deep venous thrombosis) (HCC) 2015; 2016   RLE; LLE  . Paroxysmal atrial fibrillation (HCC)    Hattie Perch/notes 01/17/2017  . PE (pulmonary embolism) 2015; 2016  . Pneumonia 1969    Medications:  See electronic med rec  Assessment: 71 y.o. M presents with SOB, generalized weakness. Pt on coumadin PTA for h/o PE and DVT back in 2015 and 2016. Admission INR subtherapeutic (1.31). CT shows eccentric clot along the periphery of the left pulmonary artery extending into the lobar and segmental branches of the left lower lobe. There has been interval increase in the clot burden compared to the prior CT with associated moderate luminal narrowing.  Home coumadin dose: 1mg  Tues/Thur/Sat and 2mg  all other days - last taken 3/20  Goal of Therapy:  Heparin level 0.3-0.7 units/ml Monitor platelets by anticoagulation protocol: Yes   Plan:  Heparin IV bolus 4000 units Heparin gtt at 1000 units/hr Will f/u heparin level in 6 hours Daily heparin level and CBC and INR Coumadin 3mg  now  Christoper Fabianaron Jennise Both, PharmD, BCPS Clinical pharmacist, pager [phone] 02/15/2017,11:04 PM

## 2017-02-15 NOTE — ED Notes (Signed)
EDP at bedside  

## 2017-02-15 NOTE — ED Notes (Signed)
Pt to xray

## 2017-02-15 NOTE — ED Notes (Signed)
Dr. Vicki MalletNie at bedside

## 2017-02-15 NOTE — ED Triage Notes (Addendum)
Pt to ED via [facility].  EMS states pt was diagnosed with CHF in Feb.  C/o increased sob, abd swelling, and generalized weakness over the past month.  Reports vomiting since last night.  Denies diarrhea.  Also reports increased urination- no changes in color or odor of urine.  Reports decreased PO intake.  PT reports "heartburn" that started when moved over from EMS stretcher.

## 2017-02-16 ENCOUNTER — Inpatient Hospital Stay (HOSPITAL_COMMUNITY): Payer: Medicare Other

## 2017-02-16 DIAGNOSIS — I4891 Unspecified atrial fibrillation: Secondary | ICD-10-CM | POA: Diagnosis present

## 2017-02-16 DIAGNOSIS — I82403 Acute embolism and thrombosis of unspecified deep veins of lower extremity, bilateral: Secondary | ICD-10-CM

## 2017-02-16 LAB — CBC
HEMATOCRIT: 33.3 % — AB (ref 39.0–52.0)
HEMOGLOBIN: 11.1 g/dL — AB (ref 13.0–17.0)
MCH: 34.8 pg — ABNORMAL HIGH (ref 26.0–34.0)
MCHC: 33.3 g/dL (ref 30.0–36.0)
MCV: 104.4 fL — ABNORMAL HIGH (ref 78.0–100.0)
Platelets: 160 10*3/uL (ref 150–400)
RBC: 3.19 MIL/uL — AB (ref 4.22–5.81)
RDW: 17.9 % — AB (ref 11.5–15.5)
WBC: 21.8 10*3/uL — AB (ref 4.0–10.5)

## 2017-02-16 LAB — BASIC METABOLIC PANEL
Anion gap: 14 (ref 5–15)
BUN: 40 mg/dL — AB (ref 6–20)
CALCIUM: 8 mg/dL — AB (ref 8.9–10.3)
CO2: 22 mmol/L (ref 22–32)
Chloride: 99 mmol/L — ABNORMAL LOW (ref 101–111)
Creatinine, Ser: 1.26 mg/dL — ABNORMAL HIGH (ref 0.61–1.24)
GFR calc Af Amer: >60 mL/min (ref >60)
GFR, EST NON AFRICAN AMERICAN: 57 mL/min — AB (ref >60)
Glucose, Bld: 98 mg/dL (ref 65–99)
POTASSIUM: 3.8 mmol/L (ref 3.5–5.1)
SODIUM: 135 mmol/L (ref 135–145)

## 2017-02-16 LAB — HEPARIN LEVEL (UNFRACTIONATED)
HEPARIN UNFRACTIONATED: 0.4 [IU]/mL (ref 0.30–0.70)
HEPARIN UNFRACTIONATED: 0.31 [IU]/mL (ref 0.30–0.70)

## 2017-02-16 LAB — PROTIME-INR
INR: 1.55
PROTHROMBIN TIME: 18.7 s — AB (ref 11.4–15.2)

## 2017-02-16 LAB — GLUCOSE, CAPILLARY: Glucose-Capillary: 106 mg/dL — ABNORMAL HIGH (ref 65–99)

## 2017-02-16 LAB — AMMONIA: Ammonia: 36 umol/L — ABNORMAL HIGH (ref 9–35)

## 2017-02-16 MED ORDER — LIDOCAINE HCL (PF) 1 % IJ SOLN
INTRAMUSCULAR | Status: AC
  Filled 2017-02-16: qty 30

## 2017-02-16 MED ORDER — METOPROLOL TARTRATE 25 MG PO TABS
25.0000 mg | ORAL_TABLET | Freq: Two times a day (BID) | ORAL | Status: DC
  Administered 2017-02-16: 25 mg via ORAL
  Filled 2017-02-16 (×3): qty 1

## 2017-02-16 MED ORDER — LEVALBUTEROL HCL 1.25 MG/0.5ML IN NEBU
1.2500 mg | INHALATION_SOLUTION | Freq: Four times a day (QID) | RESPIRATORY_TRACT | Status: DC
Start: 2017-02-16 — End: 2017-02-16
  Administered 2017-02-16 (×3): 1.25 mg via RESPIRATORY_TRACT
  Filled 2017-02-16 (×3): qty 0.5

## 2017-02-16 MED ORDER — SODIUM CHLORIDE 0.9 % IV BOLUS (SEPSIS)
500.0000 mL | Freq: Once | INTRAVENOUS | Status: AC
  Administered 2017-02-16: 500 mL via INTRAVENOUS

## 2017-02-16 MED ORDER — DM-GUAIFENESIN ER 30-600 MG PO TB12
1.0000 | ORAL_TABLET | Freq: Two times a day (BID) | ORAL | Status: DC | PRN
Start: 2017-02-16 — End: 2017-02-22
  Administered 2017-02-16: 1 via ORAL
  Filled 2017-02-16: qty 1

## 2017-02-16 MED ORDER — WARFARIN SODIUM 2.5 MG PO TABS: 2.5000 mg | ORAL_TABLET | Freq: Once | ORAL | Status: DC

## 2017-02-16 MED ORDER — METOPROLOL TARTRATE 5 MG/5ML IV SOLN
5.0000 mg | Freq: Four times a day (QID) | INTRAVENOUS | Status: DC | PRN
  Administered 2017-02-16 (×2): 5 mg via INTRAVENOUS
  Filled 2017-02-16 (×2): qty 5

## 2017-02-16 MED ORDER — WARFARIN - PHARMACIST DOSING INPATIENT
Freq: Every day | Status: DC
  Administered 2017-02-18: 17:00:00

## 2017-02-16 MED ORDER — LEVALBUTEROL HCL 1.25 MG/0.5ML IN NEBU
1.2500 mg | INHALATION_SOLUTION | Freq: Three times a day (TID) | RESPIRATORY_TRACT | Status: DC
  Administered 2017-02-17: 1.25 mg via RESPIRATORY_TRACT
  Filled 2017-02-16: qty 0.5

## 2017-02-16 MED ORDER — IPRATROPIUM-ALBUTEROL 0.5-2.5 (3) MG/3ML IN SOLN: 3.0000 mL | Freq: Four times a day (QID) | RESPIRATORY_TRACT | Status: DC | PRN

## 2017-02-16 MED ORDER — PRO-STAT SUGAR FREE PO LIQD
30.0000 mL | Freq: Two times a day (BID) | ORAL | Status: DC
  Administered 2017-02-16 – 2017-03-01 (×21): 30 mL via ORAL
  Filled 2017-02-16 (×24): qty 30

## 2017-02-16 MED ORDER — CIPROFLOXACIN IN D5W 400 MG/200ML IV SOLN
400.0000 mg | Freq: Two times a day (BID) | INTRAVENOUS | Status: DC
  Administered 2017-02-16 – 2017-02-18 (×6): 400 mg via INTRAVENOUS
  Filled 2017-02-16 (×7): qty 200

## 2017-02-16 MED ORDER — BOOST / RESOURCE BREEZE PO LIQD
1.0000 | Freq: Two times a day (BID) | ORAL | Status: DC
  Administered 2017-02-16 – 2017-02-17 (×2): 1 via ORAL
  Administered 2017-02-17: 237 mL via ORAL
  Administered 2017-02-18 – 2017-03-01 (×18): 1 via ORAL

## 2017-02-16 MED ORDER — LACTULOSE 10 GM/15ML PO SOLN
10.0000 g | Freq: Every day | ORAL | Status: DC
  Administered 2017-02-16 – 2017-02-22 (×6): 10 g via ORAL
  Filled 2017-02-16 (×7): qty 15

## 2017-02-16 MED ORDER — CHLORHEXIDINE GLUCONATE 0.12 % MT SOLN
15.0000 mL | Freq: Two times a day (BID) | OROMUCOSAL | Status: DC
  Administered 2017-02-16 – 2017-02-28 (×25): 15 mL via OROMUCOSAL
  Filled 2017-02-16 (×25): qty 15

## 2017-02-16 MED ORDER — ORAL CARE MOUTH RINSE
15.0000 mL | Freq: Two times a day (BID) | OROMUCOSAL | Status: DC
  Administered 2017-02-16 – 2017-02-28 (×18): 15 mL via OROMUCOSAL

## 2017-02-16 NOTE — Progress Notes (Signed)
Telemetry notified RN of patient's HR sustaining in the 130-140's.  RN administered PRN IV Metoprolol 5mg .   Veatrice KellsMahmoud,Anastasiya Gowin I, RN

## 2017-02-16 NOTE — Progress Notes (Signed)
Patient c/o feeling SOB and chest pressure, O2 sats at 100%, lungs sound clear. MD contacted, 02 increased to 3L/min.

## 2017-02-16 NOTE — Progress Notes (Signed)
Patient's HR sustaining in 130-150's. Patient asymptomatic. RN notified on call NP, Schorr. RN awaiting orders. RN will continue to monitor patient.  Veatrice KellsMahmoud,Rashida Ladouceur I, RN

## 2017-02-16 NOTE — Progress Notes (Signed)
Patient ID: Jeremy Herman, male   DOB: 1946/11/14, 71 y.o.   MRN: [id number]  PROGRESS NOTE    Jeremy Herman  WUJ:[id number] DOB: July 22, 1946 DOA: 02/15/2017  PCP: Lora Paula, MD   Brief Narrative:  71 y.o. male with medical history significant for atrial fibrillation and DVT/PE on Coumadin, hyperlipidemia, tobacco abuse, right heart failure with pulmonary hypertension, alcoholic cirrhosis with ascites, ASD (s/p of repair), who presented to [facility] with progressive shortness breath and abdominal distention for almost a month and worse in past several days. No chest pain, fevers. He was found to have subtherapeutic INR 1.31 on admission, negative troponin, BNP 172, negative urinalysis, mild acute renal injury with creatinine 1.22, WBC 19.0. CT angiogram of chest showed interval increase in the clot burden compared to the prior CT with associated moderate luminal narrowing. No definite occlusive thrombus/embolus identified.   Assessment & Plan:  Pulmonary embolus / DVT, bilateral lower limbs (HCC) - CT angiogram of chest showed interval increase in the clot burden, no definite occlusive thrombus/embolus identified. No evidence of right heart straining - On Heparin and coumadin but will hold coumadin so that pt can have paracentesis  - Stable resp status this am - Continue xopenex every 6 hours   Leukocytosis - Unclear etiology but because of worsening mental status changes and abdominal distention there is always a concern for possible SBP - Start Cipro - When paracentesis is done will order labs with the procedure  AKI - Likely prerenal secondary to diuretics, dehydration - Creatinine is 1.26 today - Lasix and aldactone on hold   Ascites due to alcoholic cirrhosis / Acute alcoholic encephalopathy  - Patient did not drink alcohol since January - AST and ALT normal. Total bilirubin 1.9.  - Will hold coumadin and continue heparin only so hope paracentesis can be done in day or so -  Ammonia in mid 30's - Start low dose lactulose  - Continue protonix  Atrial Fibrillation with RVR - CHA2DS2-VASc Score is 3 - On AC with coumadin and heparin - Start low dose metoprolol as HR 107 this am  Tobacco abuse: - Counseled on smoking cessation but his mental status is not so good so we will need to provide this counseling again when he is more oriented  - Continue nicotine patch   Pulmonary hypertension and R ventricular failure - The patient does not have leg edema. Heart failure compensated. - Hold diuretics due to renal insufficiency  - Continue midodrine   Malnutrition of moderate degree: - In the context of chronic illness - Nutrition consulted   Dyslipidemia  - Last LDL was 77 on 12/25/15  - Continue Lipitor   DVT prophylaxis: on coumadin and heparin drip  Code Status: full code  Family Communication: no family at the bedside this am Disposition Plan: not yet stable for discharge, more altered this am   Consultants:   PT  Nutrition   Procedures:   None   Antimicrobials:   Cipro 02/16/2017 -->   Subjective: No overnight events.   Objective: Vitals:   02/16/17 0033 02/16/17 0515 02/16/17 0929 02/16/17 0956  BP: 113/61 110/75  (!) 100/42  Pulse: (!) 56 66  (!) 107  Resp: 18 18  17   Temp: 97.7 F (36.5 C) 98.6 F (37 C)  97.9 F (36.6 C)  TempSrc: Oral Oral  Oral  SpO2: 100% 100% 99% 100%  Weight: 66.4 kg (146 lb 6.4 oz)     Height: 6' (1.829 m)  Intake/Output Summary (Last 24 hours) at 02/16/17 1035 Last data filed at 02/16/17 1000  Gross per 24 hour  Intake              567 ml  Output                0 ml  Net              567 ml   Filed Weights   02/15/17 1630 02/16/17 0033  Weight: 64.4 kg (142 lb) 66.4 kg (146 lb 6.4 oz)    Examination:  General exam: Appears calm and comfortable, disoriented  Respiratory system: diminished, no wheezing  Cardiovascular system: S1 & S2 heard, Rate controlled  Gastrointestinal  system: (+) BS, fluid wave (+), distended abd, non tender . Central nervous system: No focal neurological deficits. Extremities: Symmetric 5 x 5 power. Skin: No rashes, lesions or ulcers Psychiatry: Normal mood and behavior   Data Reviewed: I have personally reviewed following labs and imaging studies  CBC:  Recent Labs Lab 02/15/17 1615 02/16/17 0536  WBC 19.0* 21.8*  NEUTROABS 15.7*  --   HGB 12.6* 11.1*  HCT 36.7* 33.3*  MCV 104.9* 104.4*  PLT 145* 160   Basic Metabolic Panel:  Recent Labs Lab 02/15/17 1615 02/16/17 0536  NA 137 135  K 3.9 3.8  CL 101 99*  CO2 22 22  GLUCOSE 112* 98  BUN 38* 40*  CREATININE 1.22 1.26*  CALCIUM 8.2* 8.0*   GFR: Estimated Creatinine Clearance: 52 mL/min (A) (by C-G formula based on SCr of 1.26 mg/dL (H)). Liver Function Tests:  Recent Labs Lab 02/15/17 1615  AST 61*  ALT 33  ALKPHOS 100  BILITOT 3.0*  PROT 6.8  ALBUMIN 2.2*   No results for input(s): LIPASE, AMYLASE in the last 168 hours.  Recent Labs Lab 02/16/17 0217  AMMONIA 36*   Coagulation Profile:  Recent Labs Lab 02/15/17 1615 02/16/17 0217  INR 1.31 1.55   Cardiac Enzymes: No results for input(s): CKTOTAL, CKMB, CKMBINDEX, TROPONINI in the last 168 hours. BNP (last 3 results) No results for input(s): PROBNP in the last 8760 hours. HbA1C: No results for input(s): HGBA1C in the last 72 hours. CBG:  Recent Labs Lab 02/16/17 0749  GLUCAP 106*   Lipid Profile: No results for input(s): CHOL, HDL, LDLCALC, TRIG, CHOLHDL, LDLDIRECT in the last 72 hours. Thyroid Function Tests: No results for input(s): TSH, T4TOTAL, FREET4, T3FREE, THYROIDAB in the last 72 hours. Anemia Panel: No results for input(s): VITAMINB12, FOLATE, FERRITIN, TIBC, IRON, RETICCTPCT in the last 72 hours. Urine analysis:    Component Value Date/Time   COLORURINE AMBER (A) 02/15/2017 2233   APPEARANCEUR HAZY (A) 02/15/2017 2233   LABSPEC 1.020 02/15/2017 2233   PHURINE 5.0  02/15/2017 2233   GLUCOSEU NEGATIVE 02/15/2017 2233   HGBUR NEGATIVE 02/15/2017 2233   BILIRUBINUR SMALL (A) 02/15/2017 2233   KETONESUR NEGATIVE 02/15/2017 2233   PROTEINUR 30 (A) 02/15/2017 2233   UROBILINOGEN 1.0 12/26/2010 0531   NITRITE NEGATIVE 02/15/2017 2233   LEUKOCYTESUR NEGATIVE 02/15/2017 2233   Sepsis Labs: @LABRCNTIP (procalcitonin:4,lacticidven:4)   )No results found for this or any previous visit (from the past 240 hour(s)).    Radiology Studies: Dg Chest 2 View Result Date: 02/15/2017 Low lung volumes with subsegmental atelectasis in the right mid lung. No acute infiltrate or edema.   Ct Angio Chest Pe W And/or Wo Contrast Result Date: 02/15/2017 1. Eccentric clot along the periphery of the left pulmonary artery  extending into the lobar and segmental branches of the left lower lobe. There has been interval increase in the clot burden compared to the prior CT with associated moderate luminal narrowing. No definite occlusive thrombus/embolus identified. 2. Advanced coronary vascular calcification. 3. Morphologic changes of cirrhosis with partially visualized moderate upper abdominal ascites, increased since the prior CT.    Scheduled Meds: . atorvastatin  20 mg Oral q1800  . folic acid  1 mg Oral Daily  . lactulose  10 g Oral Daily  . levalbuterol  1.25 mg Nebulization Q6H  . midodrine  10 mg Oral TID WC  . multivitamin with   1 tablet Oral Daily  . nicotine  21 mg Transdermal Daily  . pantoprazole  40 mg Oral Q1200  . thiamine  100 mg Oral Daily  . Warfarin   Does not apply q1800   Continuous Infusions: . heparin 1,000 Units/hr (02/15/17 2318)     LOS: 1 day    Time spent: 25 minutes  Greater than 50% of the time spent on counseling and coordinating the care.   Manson PasseyEVINE, ALMA, MD [facility] Pager [phone]  If 7PM-7AM, please contact night-coverage [email or web address] Password [email or web address] 02/16/2017, 10:35 AM

## 2017-02-16 NOTE — Care Management Note (Signed)
Case Management Note  Patient Details  Name: Jeremy Herman MRN: [id number] Date of Birth: December 18, 1945  Subjective/Objective:     CM following for progression and d/c planning.                Action/Plan:   Expected Discharge Date:                  Expected Discharge Plan:  Home w Home Health Services  In-House Referral:  NA  Discharge planning Services  CM Consult  Post Acute Care Choice:    Choice offered to:     DME Arranged:    DME Agency:     HH Arranged:    HH Agency:     Status of Service:  In process, will continue to follow  If discussed at Long Length of Stay Meetings, dates discussed:    Additional Comments:  Starlyn SkeansRoyal, Harnoor Reta U, RN 02/16/2017, 4:18 PM

## 2017-02-16 NOTE — Progress Notes (Signed)
Patient has not urinated in over 20 hours. Patient denies the urge to. Bladder scanned: 493 mL. On call NP, Schorr, notified. Verbal order for urinary catheter insertion given. RN will implement.  Veatrice KellsMahmoud,Marciano Mundt I, RN

## 2017-02-16 NOTE — Progress Notes (Signed)
Paracentesis cancelled due to low blood pressure. [facility]. Pt awake and alert. In no distress.

## 2017-02-16 NOTE — Progress Notes (Signed)
New Admission Note:   Arrival Method: Stretcher from ED with NT Mental Orientation: A&O x4 Telemetry: Box 25, CCMD notified Assessment: Completed Skin: Assessed with Joseph PieriniJequetta W, RN, check flowsheets IV: L. FA, Heparin 4110ml/hr infusing Pain: 0/10 Tubes: N/A Safety Measures: Safety Fall Prevention Plan discussed with patient. Admission: Completed [address] Orientation: Patient has been orientated to the room, unit and the staff. Family: None at bedside.  Orders have been reviewed and implemented. Will continue to monitor the patient. Patient is a high fall risk: Yellow non-skid socks and yellow armband applied. Call light has been placed within reach and bed alarm has been activated.   Rivka BarbaraZenab Irl Bodie BSN, RN  Phone Number: [phone]

## 2017-02-16 NOTE — Progress Notes (Signed)
Patient has continued to have increased confusion throughout the day. No urinary output. Increased shortness of breath. Increased heart rate and low blood pressure. MD made aware.

## 2017-02-16 NOTE — Progress Notes (Signed)
Initial Nutrition Assessment  DOCUMENTATION CODES:   Severe malnutrition in context of chronic illness  INTERVENTION:  Provide Boost Breeze po BID, each supplement provides 250 kcal and 9 grams of protein  Provide 30 ml Prostat po BID, each supplement provides 100 kcal and 15 grams of protein.   Encourage adequate PO intake.   NUTRITION DIAGNOSIS:   Malnutrition related to chronic illness as evidenced by energy intake < or equal to 75% for > or equal to 1 month, severe depletion of body fat, severe depletion of muscle mass.  GOAL:   Patient will meet greater than or equal to 90% of their needs  MONITOR:   PO intake, Supplement acceptance, Labs, Weight trends, Skin, I & O's  REASON FOR ASSESSMENT:   Consult Assessment of nutrition requirement/status  ASSESSMENT:   71 y.o. male with medical history significant for atrial fibrillation and DVT/PE on Coumadin, hyperlipidemia, tobacco abuse, right heart failure with pulmonary hypertension, alcoholic cirrhosis with ascites, ASD (s/p of repair), [facility] shortness breath and abdominal distention for almost a month and worse in past several days.   Pt reports having a decreased appetite which has been ongoing over the past 3 months. He reports consuming one meal a day which has been soup and salad. Pt reports experiencing a 4 lb weight loss over the past 3 months. Per weight records, pt with a 8.7% weight loss in 1 month, however weight loss may be related to fluid status. Plans for paracentesis this admission for ascites. No po yet today as diet has just been advanced this AM. Pt is agreeable to nutritional supplements to aid in caloric and protein needs. Pt reports disliking the "milky" supplements. Pt agreeable to Parker HannifinBoost Breeze. RD to order. RD to additionally order Prostat to aid in protein needs.   Nutrition-Focused physical exam completed. Findings are severe fat depletion, severe muscle depletion, and mild  edema.   Labs and medications reviewed.   Diet Order:  Diet 2 gram sodium Room service appropriate? Yes; Fluid consistency: Thin  Skin:  Reviewed, no issues  Last BM:  3/22  Height:   Ht Readings from Last 1 Encounters:  02/16/17 6' (1.829 m)    Weight:   Wt Readings from Last 1 Encounters:  02/16/17 146 lb 6.4 oz (66.4 kg)    Ideal Body Weight:  80.9 kg  BMI:  Body mass index is 19.86 kg/m.  Estimated Nutritional Needs:   Kcal:  1950-2150  Protein:  95-105 grams  Fluid:  Per MD  EDUCATION NEEDS:   No education needs identified at this time  Roslyn SmilingStephanie Michalina Calbert, MS, RD, LDN Pager # [phone] After hours/ weekend pager # [phone]

## 2017-02-16 NOTE — Progress Notes (Addendum)
ANTICOAGULATION CONSULT NOTE - Follow Up Consult  Pharmacy Consult for Heparin Indication: pulmonary embolus  Patient Measurements: Height: 6' (182.9 cm) Weight: 146 lb 6.4 oz (66.4 kg) IBW/kg (Calculated) : 77.6 Heparin Dosing Weight: 66 kg  Vital Signs: Temp: 97.9 F (36.6 C) (03/22 0956) Temp Source: Oral (03/22 0956) BP: 100/42 (03/22 0956) Pulse Rate: 107 (03/22 0956)  Labs:  Recent Labs  02/15/17 1615 02/16/17 0217 02/16/17 0536  HGB 12.6*  --  11.1*  HCT 36.7*  --  33.3*  PLT 145*  --  160  LABPROT 16.3* 18.7*  --   INR 1.31 1.55  --   HEPARINUNFRC  --   --  0.40  CREATININE 1.22  --  1.26*    Estimated Creatinine Clearance: 52 mL/min (A) (by C-G formula based on SCr of 1.26 mg/dL (H)).   Medications:  Heparin @ 1000 units/hr  Assessment: 71 y.o. M presents with SOB, generalized weakness. Pt on coumadin PTA for h/o PE and DVT back in 2015 and 2016. Admission INR subtherapeutic (1.31) on PTA dose. CT shows eccentric clot along the periphery of the left pulmonary artery extending into the lobar and segmental branches of the left lower lobe. There has been interval increase in the clot burden compared to the prior CT with associated moderate luminal narrowing. Pharmacy was consulted to resume warfarin and start heparin bridge.  Home coumadin dose: 1mg  Tues/Thur/Sat and 2mg  all other days - last taken 3/20  The patient's heparin level this morning is therapeutic (HL 0.4, goal of 0.3-0.7). INR remains SUBtherapeutic though is trending up (INR 1.55 << 1.31, goal of 2-3). Hgb 11.1 << 12.6, plts wnl. No bleeding noted at this time.   Goal of Therapy:  INR 2-3 Heparin level 0.3-0.7 units/ml Monitor platelets by anticoagulation protocol: Yes   Plan:  1. Continue Heparin at 1000 units/hr (10 ml/hr) 2. Warfarin 2.5 mg x 1 dose at 1800 today 3. Will continue to monitor for any signs/symptoms of bleeding and will follow up with heparin level in 6 hours to confirm and  PT/INR in the AM  Thank you for allowing pharmacy to be a part of this patient's care.  Georgina PillionElizabeth Charmel Herman, PharmD, BCPS Clinical Pharmacist Pager: [phone] Clinical phone for 02/16/2017 from 7a-3:30p: [phone] If after 3:30p, please call main pharmacy at: [phone] 02/16/2017 10:28 AM    ------------------------------------------------------------------------------------------------------------ Addendum:  The patient's heparin level this afternoon remains therapeutic (HL 0.31 << 0.4, goal of 0.3-0.7). No bleeding noted at this time. Will increase slightly to keep within range and follow-up AM heparin level.  Also per MD note today - to hold warfarin for paracentesis.   1. Adjust Heparin to 1050 units/hr (10.5 ml/hr) 2. Hold warfarin dose today 3. Will continue to monitor for any signs/symptoms of bleeding and will follow up with heparin level in the a.m.   Thank you for allowing pharmacy to be a part of this patient's care.  Georgina PillionElizabeth Tahsin Herman, PharmD, BCPS Clinical Pharmacist Pager: [phone] Clinical phone for 02/16/2017 from 7a-3:30p: [phone] If after 3:30p, please call main pharmacy at: [phone] 02/16/2017 2:13 PM

## 2017-02-17 ENCOUNTER — Inpatient Hospital Stay (HOSPITAL_COMMUNITY): Payer: Medicare Other

## 2017-02-17 DIAGNOSIS — K7031 Alcoholic cirrhosis of liver with ascites: Secondary | ICD-10-CM

## 2017-02-17 DIAGNOSIS — I509 Heart failure, unspecified: Secondary | ICD-10-CM

## 2017-02-17 DIAGNOSIS — I959 Hypotension, unspecified: Secondary | ICD-10-CM

## 2017-02-17 DIAGNOSIS — Z515 Encounter for palliative care: Secondary | ICD-10-CM

## 2017-02-17 DIAGNOSIS — E43 Unspecified severe protein-calorie malnutrition: Secondary | ICD-10-CM | POA: Insufficient documentation

## 2017-02-17 DIAGNOSIS — R140 Abdominal distension (gaseous): Secondary | ICD-10-CM | POA: Diagnosis present

## 2017-02-17 DIAGNOSIS — I2699 Other pulmonary embolism without acute cor pulmonale: Principal | ICD-10-CM

## 2017-02-17 LAB — BASIC METABOLIC PANEL
ANION GAP: 13 (ref 5–15)
BUN: 45 mg/dL — ABNORMAL HIGH (ref 6–20)
CALCIUM: 7.6 mg/dL — AB (ref 8.9–10.3)
CO2: 22 mmol/L (ref 22–32)
CREATININE: 1.76 mg/dL — AB (ref 0.61–1.24)
Chloride: 98 mmol/L — ABNORMAL LOW (ref 101–111)
GFR, EST AFRICAN AMERICAN: 44 mL/min — AB (ref >60)
GFR, EST NON AFRICAN AMERICAN: 38 mL/min — AB (ref >60)
Glucose, Bld: 104 mg/dL — ABNORMAL HIGH (ref 65–99)
Potassium: 3.4 mmol/L — ABNORMAL LOW (ref 3.5–5.1)
Sodium: 133 mmol/L — ABNORMAL LOW (ref 135–145)

## 2017-02-17 LAB — CBC
HCT: 30.1 % — ABNORMAL LOW (ref 39.0–52.0)
Hemoglobin: 10.3 g/dL — ABNORMAL LOW (ref 13.0–17.0)
MCH: 35.4 pg — ABNORMAL HIGH (ref 26.0–34.0)
MCHC: 34.2 g/dL (ref 30.0–36.0)
MCV: 103.4 fL — ABNORMAL HIGH (ref 78.0–100.0)
Platelets: 152 10*3/uL (ref 150–400)
RBC: 2.91 MIL/uL — AB (ref 4.22–5.81)
RDW: 17.9 % — AB (ref 11.5–15.5)
WBC: 19.3 10*3/uL — ABNORMAL HIGH (ref 4.0–10.5)

## 2017-02-17 LAB — GLUCOSE, CAPILLARY: GLUCOSE-CAPILLARY: 118 mg/dL — AB (ref 65–99)

## 2017-02-17 LAB — HEPARIN LEVEL (UNFRACTIONATED): Heparin Unfractionated: 0.31 IU/mL (ref 0.30–0.70)

## 2017-02-17 LAB — TROPONIN I: TROPONIN I: 0.03 ng/mL — AB (ref <0.03)

## 2017-02-17 MED ORDER — SODIUM CHLORIDE 0.9 % IV SOLN
INTRAVENOUS | Status: DC
  Administered 2017-02-17: 15:00:00 via INTRAVENOUS

## 2017-02-17 MED ORDER — LEVALBUTEROL HCL 1.25 MG/0.5ML IN NEBU: 1.2500 mg | INHALATION_SOLUTION | RESPIRATORY_TRACT | Status: DC | PRN

## 2017-02-17 NOTE — Evaluation (Signed)
Physical Therapy Evaluation Patient Details Name: Jeremy Herman: [id number] DOB: 1946/01/26 Today's Date: 02/17/2017   History of Present Illness   Jeremy Herman is a 71 y.o. male with medical history significant of atrial fibrillation and DVT/PE on Coumadin, hyperlipidemia, tobacco abuse, right heart failure with pulmonary hypertension, alcoholic cirrhosis with ascites, ASD (s/p of repair), who presents with worsening shortness breath and worsening abdominal distention.  CT showing PE, paracentesis pending.  Clinical Impression  Pt admitted with/for SOB due to PE.Marland Kitchen.  Pt moving painfully slow and needing min assist at best..  Pt currently limited functionally due to the problems listed. ( See problems list.)   Pt will benefit from PT to maximize function and safety in order to get ready for next venue listed below.     Follow Up Recommendations SNF    Equipment Recommendations  Other (comment) (TBA)    Recommendations for Other Services       Precautions / Restrictions Precautions Precautions: Fall      Mobility  Bed Mobility Overal bed mobility: Needs Assistance Bed Mobility: Supine to Sit     Supine to sit: Min assist     General bed mobility comments: painfully slow movement, need for frequent redirection.  Some scooting assist and assist coming up and forward.  Transfers Overall transfer level: Needs assistance Equipment used: Rolling walker (2 wheeled) Transfers: Sit to/from Stand Sit to Stand: Min assist            Ambulation/Gait Ambulation/Gait assistance: Min assist Ambulation Distance (Feet): 18 Feet Assistive device: Rolling walker (2 wheeled) Gait Pattern/deviations: Step-through pattern   Gait velocity interpretation: Below normal speed for age/gender General Gait Details: slow and mildly unsteady gait.  Maneuvering RW into stationary objects.  Dyspneic.  Sats once in chair on 3L was down into the mid 80's  Stairs            Wheelchair  Mobility    Modified Rankin (Stroke Patients Only)       Balance Overall balance assessment: Needs assistance Sitting-balance support: Feet supported Sitting balance-Leahy Scale: Fair     Standing balance support: Bilateral upper extremity supported Standing balance-Leahy Scale: Poor Standing balance comment: reliant on support or AD                             Pertinent Vitals/Pain Pain Assessment: Faces Faces Pain Scale: Hurts little more Pain Location: generalized Pain Descriptors / Indicators: Grimacing;Guarding;Sore Pain Intervention(s): Monitored during session;Limited activity within patient's tolerance    Home Living Family/patient expects to be discharged to:: Private residence Living Arrangements: Alone Available Help at Discharge: Friend(s);Available PRN/intermittently Type of Home: Apartment Home Access: Level entry     Home Layout: One level Home Equipment: Walker - 2 wheels;Bedside commode;Grab bars - tub/shower Additional Comments: has high commode    Prior Function Level of Independence: Independent with assistive device(s)         Comments: Pt report everything getting hard, has stopped driving.  Still using RW for short distances.     Hand Dominance   Dominant Hand: Right    Extremity/Trunk Assessment   Upper Extremity Assessment Upper Extremity Assessment: Overall WFL for tasks assessed;Generalized weakness    Lower Extremity Assessment Lower Extremity Assessment: Generalized weakness       Communication   Communication: No difficulties  Cognition Arousal/Alertness: Awake/alert;Lethargic Behavior During Therapy: Restless Overall Cognitive Status: Impaired/Different from baseline Area of Impairment: Attention;Following commands;Awareness;Problem solving  Current Attention Level: Focused;Sustained   Following Commands: Follows one step commands with increased time   Awareness:  Intellectual Problem Solving: Slow processing;Decreased initiation        General Comments      Exercises     Assessment/Plan    PT Assessment Patient needs continued PT services  PT Problem List Decreased strength;Decreased activity tolerance;Decreased balance;Decreased mobility;Cardiopulmonary status limiting activity       PT Treatment Interventions Gait training;DME instruction;Functional mobility training;Therapeutic activities;Therapeutic exercise;Patient/family education    PT Goals (Current goals can be found in the Care Plan section)  Acute Rehab PT Goals Patient Stated Goal: Pt not focused on setting goals PT Goal Formulation: Patient unable to participate in goal setting Time For Goal Achievement: 03/03/17 Potential to Achieve Goals: Fair    Frequency Min 3X/week   Barriers to discharge Decreased caregiver support      Co-evaluation               End of Session   Activity Tolerance: Patient limited by fatigue;Patient limited by lethargy Patient left: in chair;with call bell/phone within reach;with chair alarm set;with nursing/sitter in room Nurse Communication: Mobility status PT Visit Diagnosis: Other abnormalities of gait and mobility (R26.89);Muscle weakness (generalized) (M62.81)    Time: 1610-9604 PT Time Calculation (min) (ACUTE ONLY): 42 min   Charges:   PT Evaluation $PT Eval Moderate Complexity: 1 Procedure PT Treatments $Gait Training: 8-22 mins $Therapeutic Activity: 8-22 mins   PT G Codes:        2017/03/17  Phelps Bing, PT [phone]  (pager)  Eliseo Gum Sylus Stgermain 03/17/2017, 11:34 AM 03/17/17  Ringwood Bing, PT [phone]  (pager)

## 2017-02-17 NOTE — Consult Note (Signed)
Name: Jeremy Herman MRN: [id number] DOB: [birth year]    ADMISSION DATE:  02/15/2017 CONSULTATION DATE:  02/17/17  REFERRING MD :  Izola Price  CHIEF COMPLAINT:  SOB   HISTORY OF PRESENT ILLNESS:  Jeremy Herman is a 71 y.o. male with a PMH as outlined below including DVT and PE's (currently on coumadin), alcoholic cirrhosis with ascites (has required 1 paracentesis in January - done by IR), chronic hypotension (on midodrine).  He presented to ED 3/21 with SOB that had worsened over the prior month but more so on the few days prior to presentation.  He reports that he had been compliant with his Coumadin and had been taking it as prescribed (though INR in ED 1.31).  He had CTA of the chest which demonstrated increase in clot burden compared to prior scan, but no definitive occlusive thrombus / embolus.  Ascites was also noted.  On 3/23, he had drop in BP (though note that he is chronically hypotensive) and HR remained elevated (currently 120s).  Due to this plus underlying PE, PCCM was asked to see.  On chart review, palliative care consult from 3/23 noted.  Pt wishes to remain full code for now; though, he may lack clear insight to his chronic conditions.   PAST MEDICAL HISTORY :   has a past medical history of Arthritis; Chronic anticoagulation; DVT (deep venous thrombosis) (HCC) (2015; 2016); Paroxysmal atrial fibrillation (HCC); PE (pulmonary embolism) (2015; 2016); and Pneumonia (1969).  has a past surgical history that includes Femur IM nail (Left, 04/21/2016); Right/Left Heart Cath and Coronary Angiography (N/A, 01/13/2017); TEE without cardioversion (N/A, 01/13/2017); ASD repair (01/17/2017); Ankle fracture surgery (Right, 1979); Fracture surgery; Cardiac catheterization (01/13/2017); and Atrial Septal Defect(ASD) Closure (N/A, 01/17/2017). Prior to Admission medications   Medication Sig Start Date End Date Taking? Authorizing Provider  atorvastatin (LIPITOR) 20 MG tablet Take 1 tablet (20 mg total)  by mouth daily at 6 PM. 01/18/17  Yes [name], MD  furosemide (LASIX) 40 MG tablet Take 1 tablet (40 mg total) by mouth daily. 01/19/17  Yes [name], MD  midodrine (PROAMATINE) 10 MG tablet Take 1 tablet (10 mg total) by mouth 3 (three) times daily with meals. Patient taking differently: Take 10 mg by mouth daily. TAKES WITH MEALS BUT USUALLY ONLY EATS ONCE DAILY 01/18/17  Yes [name], MD  senna-docusate (SENOKOT-S) 8.6-50 MG tablet Take 1 tablet by mouth at bedtime as needed for mild constipation. 01/18/17  Yes [name], MD  spironolactone (ALDACTONE) 100 MG tablet Take 2 tablets (200 mg total) by mouth daily. 01/19/17  Yes [name], MD  warfarin (COUMADIN) 2 MG tablet Take 1 tablet by mouth every day but 2 tablets on Tuesdays and Saturdays. Patient taking differently: Take 1-2 mg by mouth See admin instructions. 1mg  On Tuesday, Thursday, Saturday - pt takes 2mg  on Sunday, Monday, Wednesday, Friday 12/09/16  Yes Josalyn Funches, MD  feeding supplement, ENSURE ENLIVE, (ENSURE ENLIVE) LIQD Take 237 mLs by mouth 2 (two) times daily between meals. Patient not taking: Reported on 02/15/2017 01/18/17   [name], MD  folic acid (FOLVITE) 1 MG tablet Take 1 tablet (1 mg total) by mouth daily. Patient not taking: Reported on 01/10/2017 12/13/16   Dessa Phi, MD  Multiple Vitamins-Minerals (MULTIVITAMIN) tablet Take 1 tablet by mouth daily. Patient not taking: Reported on 01/10/2017 12/13/16   Dessa Phi, MD  thiamine (VITAMIN B-1) 100 MG tablet Take 1 tablet (100 mg total) by mouth daily. Patient not taking: Reported on 01/10/2017  12/13/16   Josalyn Funches, MD  traMADol (ULTRAM) 50 MG tablet Take 1 tablet (50 mg total) by mouth every 12 (twelve) hours as needed. Patient not taking: Reported on 02/15/2017 12/09/16   Dessa Phi, MD   Allergies  Allergen Reactions  . Fish Allergy Anaphylaxis and Other (See Comments)    Diarrhea     FAMILY HISTORY:  family history includes  Aneurysm in his mother; Cancer in his mother; Deep vein thrombosis (age of onset: 53) in his brother; Stomach cancer in his father. SOCIAL HISTORY:  reports that he has been smoking Cigarettes.  He has a 48.00 pack-year smoking history. He has never used smokeless tobacco. He reports that he does not drink alcohol or use drugs.  REVIEW OF SYSTEMS:   All negative; except for those that are bolded, which indicate positives.  Constitutional: weight loss, weight gain, night sweats, fevers, chills, fatigue, weakness.  HEENT: headaches, sore throat, sneezing, nasal congestion, post nasal drip, difficulty swallowing, tooth/dental problems, visual complaints, visual changes, ear aches. Neuro: difficulty with speech, weakness, numbness, ataxia. CV:  chest pain, orthopnea, PND, swelling in lower extremities, dizziness, palpitations, syncope.  Resp: cough, hemoptysis, dyspnea, wheezing. GI: heartburn, indigestion, abdominal pain, nausea, vomiting, diarrhea, constipation, change in bowel habits, loss of appetite, hematemesis, melena, hematochezia, abdominal distention. GU: dysuria, change in color of urine, urgency or frequency, flank pain, hematuria. MSK: joint pain or swelling, decreased range of motion. Psych: change in mood or affect, depression, anxiety, suicidal ideations, homicidal ideations. Skin: rash, itching, bruising.    SUBJECTIVE:  Still feels SOB, though is on RA with SpO2 99%.  Denies chest pain.  SBP 82/70 (was 94/57 earlier).  RN reports that on 3/22, he received lopressor for AFRVR. Currently NSR with HR 120's.   VITAL SIGNS: Temp:  [97.5 F (36.4 C)-97.9 F (36.6 C)] 97.5 F (36.4 C) (03/23 0904) Pulse Rate:  [83-121] 121 (03/23 1111) Resp:  [14-20] 14 (03/23 0847) BP: (78-101)/(43-72) 82/70 (03/23 1111) SpO2:  [97 %-100 %] 99 % (03/23 0847)  PHYSICAL EXAMINATION: General: Chronically ill appearing male, frail, sitting up in chair, in NAD. Neuro: Awake, answers basic  questions appropriately.  No focal deficits. HEENT: [place]/AT. PERRL, sclerae anicteric. Cardiovascular: RRR, no M/R/G.  Lungs: Respirations even and unlabored.  CTA bilaterally, No W/R/R.  Abdomen:  Increase in abdominal girth with + fluid wave. BS x 4, soft, NT.  Musculoskeletal: No gross deformities, no edema.  Skin: Dry, warm, no rashes.   Recent Labs Lab 02/15/17 1615 02/16/17 0536 02/17/17 0329  NA 137 135 133*  K 3.9 3.8 3.4*  CL 101 99* 98*  CO2 22 22 22   BUN 38* 40* 45*  CREATININE 1.22 1.26* 1.76*  GLUCOSE 112* 98 104*    Recent Labs Lab 02/15/17 1615 02/16/17 0536 02/17/17 0329  HGB 12.6* 11.1* 10.3*  HCT 36.7* 33.3* 30.1*  WBC 19.0* 21.8* 19.3*  PLT 145* 160 152   Dg Chest 2 View  Result Date: 02/15/2017 CLINICAL DATA:  Shortness of breath for 4 weeks EXAM: CHEST  2 VIEW COMPARISON:  01/11/2017 FINDINGS: There are low lung volumes. Subsegmental atelectasis in the right mid lung. No acute infiltrate or effusion. Heart size within normal limits. New ASD closure device. Atherosclerosis. No pneumothorax. IMPRESSION: Low lung volumes with subsegmental atelectasis in the right mid lung. No acute infiltrate or edema. Electronically Signed   By: Jasmine Pang M.D.   On: 02/15/2017 16:51   Ct Angio Chest Pe W And/or Wo Contrast  Result Date: 02/15/2017 CLINICAL DATA:  71 year old male with shortness of breath. History of pulmonary embolism. EXAM: CT ANGIOGRAPHY CHEST WITH CONTRAST TECHNIQUE: Multidetector CT imaging of the chest was performed using the standard protocol during bolus administration of intravenous contrast. Multiplanar CT image reconstructions and MIPs were obtained to evaluate the vascular anatomy. CONTRAST:  100 cc Isovue 370 COMPARISON:  Chest CT dated 11/09/2017 and 07/08/2014 FINDINGS: Cardiovascular: There is no cardiomegaly or pericardial effusion. Advanced multi vessel coronary vascular calcification involving the LAD, left circumflex artery, and right  RCA. There is moderate atherosclerotic calcification of the thoracic aorta. No aneurysmal dilatation or evidence of dissection. There is non opacification of the visualized proximal left vertebral artery. This is of indeterminate chronicity. Correlation with clinical exam is recommended. CT angiography of the neck is recommended if there is clinical concern for acute left vertebral artery occlusion. There is dilatation of the main pulmonary artery suggestive of underlying pulmonary hypertension. Eccentric clot noted along the walls of the left pulmonary artery extending to the lobar and segmental branches of the left lower lobe. There is interval increase in the clot burden compared to the prior CT of 01/10/2017. There is associated luminal narrowing of the the lobar and segmental branches of the left lower lobe pulmonary arteries without definite evidence of complete occlusion. There is no CT evidence of right heart straining. Mediastinum/Nodes: No hilar or mediastinal adenopathy. The esophagus and the thyroid gland are grossly unremarkable. Lungs/Pleura: Right lung base linear atelectasis/ scarring noted. There is no focal consolidation, pleural effusion, or pneumothorax. The central airways are patent. Upper Abdomen: There is irregularity of the hepatic contour concerning for underlying cirrhosis. Partially visualized moderate size ascites in the upper abdomen has increased compared to the prior CT. Musculoskeletal: Osteopenia with degenerative changes of the spine. No acute fracture. There is loss of subcutaneous fat and cachexia. Review of the MIP images confirms the above findings. IMPRESSION: 1. Eccentric clot along the periphery of the left pulmonary artery extending into the lobar and segmental branches of the left lower lobe. There has been interval increase in the clot burden compared to the prior CT with associated moderate luminal narrowing. No definite occlusive thrombus/embolus identified. 2. Advanced  coronary vascular calcification. 3. Morphologic changes of cirrhosis with partially visualized moderate upper abdominal ascites, increased since the prior CT. These results were called by telephone at the time of interpretation on 02/15/2017 at 10:48 pm to Dr. Crista CurbANA LIU , who verbally acknowledged these results. Electronically Signed   By: Elgie CollardArash  Radparvar M.D.   On: 02/15/2017 22:51    STUDIES:  CTA chest 3/21 > interval increased in clot burden compared to prior CT.  No definite occlusive thrombus / embolus.  Morphologic changes of cirrhosis with moderate upper abdominal ascites, increased since prior CT.  SIGNIFICANT EVENTS  3/21 > admit. 3/23 > PCCM consult, palliative care consult.  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  Per report, pt has been compliant with coumadin though INR on admit noted to be 1.31. Plan: Continue IV heparin and warfarin. Assess echo. Will defer LE duplex now as treatment would remain same (doubt would place IVC filter). Repeat troponin to ensure no rise.  Chronic hypotension - baseline SBP appears to be 90's to low 100's.  Of note, pt did receive lopressor 3/22 (though would be out of system by now). Plan: Continue IVF's, midodrine. Assess cortisol, if < 20 then start empiric stress steroids.  Ascites - per pt, required paracentesis in January with 2.7L transudative fluid removed. Plan: Given his borderline "relative hypotension" at the moment, would avoid performing paracentesis right now. Once BP improves some, can consider diagnostic / therapeutic para but would absolutely administer Albumin before and after procedure (would also need to be off of Warfarin Heparin). It is reasonable to continue empiric treatment for possible SBP given leukocytosis (despite no fever or abdominal tenderness).  Generalized deconditioning - with what sounds like failure to thrive per history. Plan: Agree with palliative care  involvement.  Rest per primary team.  Nothing further to add at this point.  PCCM will sign off.  Please do not hesitate to call us back if we can be of any further assistance.   Rutherford Guys, [place] [facility] Pager: [phone]  or [phone] 02/17/2017, 1:47 PM  Attending Note:  I have examined patient, reviewed labs, studies and notes. I have discussed the case with Ihor Dow, and I agree with the data and plans as amended above.  71 year old man with alcoholic cirrhosis, associated chronic hypotension, associated chronic ascites (last paracentesis in January), history of pulmonary embolism on outpatient Coumadin. He was admitted with dyspnea, tachycardia. A CT of the chest showed acute on chronic thromboembolic disease. Unclear whether he is compliant with Coumadin note presentation INR 1.31. He has been started on heparin. On 3/23 he has had relative hypotension (on midodrin). On my eval he is sitting up to chair, comfortable. He is a bit slow to respond but does answer questions appropriately. He is tachycardic 120. Shallow breaths. Clear breath sounds but very decreased at the bases. His abdomen is distended, protuberant, nontender to palpation. His issues include acute on chronic pulmonary embolism with suspected secondary pulmonary hypertension. Chronic liver disease with ascites that can certainly contribute up her hypertension as well. Associated systemic hypotension. At this time his blood pressure is marginal but is likely close to his baseline. It would be reasonable to give him some gentle IV fluids but I would not be aggressive with this. I will be hasn't had to perform a paracentesis at this time in order to avoid fluid shifts. If one is performed and certainly he will need albumin supplementation. Agree with empiric treatment for possible SBP. Agree with heparin drip. Will need to confirm that he has good follow-up and compliance for his Coumadin.  No other recommendations at this time. Agree with palliative care input.    Levy Pupa, MD, PhD 02/17/2017, 3:15 PM  Pulmonary and Critical Care [phone] or if no answer [phone]

## 2017-02-17 NOTE — Progress Notes (Signed)
ANTICOAGULATION CONSULT NOTE - Follow Up Consult  Pharmacy Consult for Heparin Indication: pulmonary embolus  Patient Measurements: Height: 6' (182.9 cm) Weight: 146 lb 6.4 oz (66.4 kg) IBW/kg (Calculated) : 77.6 Heparin Dosing Weight: 66 kg  Vital Signs: Temp: 97.5 F (36.4 C) (03/23 0904) Temp Source: Oral (03/23 0544) BP: 82/70 (03/23 1111) Pulse Rate: 121 (03/23 1111)  Labs:  Recent Labs  02/15/17 1615 02/16/17 0217 02/16/17 0536 02/16/17 1241 02/17/17 0329  HGB 12.6*  --  11.1*  --  10.3*  HCT 36.7*  --  33.3*  --  30.1*  PLT 145*  --  160  --  152  LABPROT 16.3* 18.7*  --   --   --   INR 1.31 1.55  --   --   --   HEPARINUNFRC  --   --  0.40 0.31 0.31  CREATININE 1.22  --  1.26*  --  1.76*    Estimated Creatinine Clearance: 37.2 mL/min (A) (by C-G formula based on SCr of 1.76 mg/dL (H)).   Medications:  Heparin @ 1050 units/hr  Assessment: 71 y.o. M presents with SOB, generalized weakness. Pt on coumadin PTA for h/o PE and DVT back in 2015 and 2016. Admission INR subtherapeutic (1.31) on PTA dose. CT shows eccentric clot along the periphery of the left pulmonary artery extending into the lobar and segmental branches of the left lower lobe. There has been interval increase in the clot burden compared to the prior CT with associated moderate luminal narrowing. Pharmacy was consulted to resume warfarin and start heparin bridge. Warfarin was held starting 3/22 for possible paracentesis plans.  Home coumadin dose: 1mg  Tues/Thur/Sat and 2mg  all other days - last taken 3/20  The patient's heparin level this morning is therapeutic (HL 0.31, goal of 0.3-0.7). INR was not checked today but was SUBtherapeutic on 3/22 AM and has not received any warfarin doses since held for paracentesis. Hgb 10.3 << 11.1, plts wnl. No bleeding noted at this time. Cipro was started 3/22 which will increase warfarin sensitivity.  Goal of Therapy:  INR 2-3 Heparin level 0.3-0.7  units/ml Monitor platelets by anticoagulation protocol: Yes   Plan:  1. Increase Heparin drip slightly to 1100 units/hr (11 ml/hr) to keep within range 2. Will follow-up warfarin restart plans s/p paracentesis 3. Will continue to monitor for any signs/symptoms of bleeding and will follow up with heparin level and PT/INR in the AM  Thank you for allowing pharmacy to be a part of this patient's care.  Georgina PillionElizabeth Tameika Heckmann, PharmD, BCPS Clinical Pharmacist Pager: [phone] Clinical phone for 02/17/2017 from 7a-3:30p: [phone] If after 3:30p, please call main pharmacy at: [phone] 02/17/2017 11:33 AM

## 2017-02-17 NOTE — NC FL2 (Signed)
[place] MEDICAID FL2 LEVEL OF CARE SCREENING TOOL     IDENTIFICATION  Patient Name: Jeremy Herman Birthdate: [birth year] Sex: male Admission Date (Current Location): 02/15/2017  [facility] and [place] Number:  Producer, television/film/video and Address:  [facility], [address], [place], [place] [address]      Provider Number: [id number]  Attending Physician Name and Address:  Dorothea Ogle, MD  Relative Name and Phone Number:       Current Level of Care: Hospital Recommended Level of Care: Skilled Nursing Facility Prior Approval Number:    Date Approved/Denied:   PASRR Number: [id number]  Discharge Plan: SNF    Current Diagnoses: Patient Active Problem List   Diagnosis Date Noted  . Protein-calorie malnutrition, severe 02/17/2017  . Abdominal distension   . Palliative care encounter   . Atrial fibrillation with RVR (HCC) 02/16/2017  . PE (pulmonary thromboembolism) (HCC) 02/15/2017  . HLD (hyperlipidemia) 02/15/2017  . Generalized weakness   . Pressure injury of skin 01/17/2017  . Malnutrition of moderate degree 01/16/2017  . Pulmonary hypertension, primary (HCC)   . RVF (right ventricular failure) (HCC)   . Ascites due to alcoholic cirrhosis (HCC)   . Atrial septal defect, secundum   . SOB (shortness of breath) 01/11/2017  . Pulmonary embolus (HCC)   . Other ascites   . Shortness of breath   . Chronic pulmonary embolism (HCC) 01/10/2017  . Ascites 01/10/2017  . Macrocytic anemia 12/13/2016  . Pulmonary hypertension 12/09/2016  . Bilateral chronic knee pain 12/09/2016  . Tremor 12/09/2016  . Fracture, intertrochanteric, left femur (HCC) 04/21/2016  . Femur fracture (HCC) 04/20/2016  . Femur fracture, left (HCC) 04/20/2016  . Palpitations 02/09/2016  . Left hip pain   . Hypokalemia   . Megaloblastic anemia   . Thrombocytopenia (HCC)   . Hip fracture (HCC) 10/24/2015  . Tobacco abuse 10/24/2015  . Chronic anticoagulation 10/24/2015  .  Hypertension 10/24/2015  . Hip fracture, right (HCC) 10/24/2015  . DVT, lower extremity (HCC)   . Smoker 07/08/2014  . 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 Bilateral pulmonary embolism (HCC) 09/17/2013  . DVT, bilateral lower limbs (HCC) 09/17/2013    Orientation RESPIRATION BLADDER Height & Weight     Self, Time, Place  O2 (Nasal Cannula 3L) Continent Weight: 146 lb 6.4 oz (66.4 kg) Height:  6' (182.9 cm)  BEHAVIORAL SYMPTOMS/MOOD NEUROLOGICAL BOWEL NUTRITION STATUS      Continent  (Please see discharge summary)  AMBULATORY STATUS COMMUNICATION OF NEEDS Skin   Limited Assist Verbally Normal                       Personal Care Assistance Level of Assistance  Bathing, Feeding, Dressing Bathing Assistance: Limited assistance Feeding assistance: Independent Dressing Assistance: Limited assistance     Functional Limitations Info  Sight, Hearing, Speech Sight Info: Adequate Hearing Info: Adequate Speech Info: Adequate    SPECIAL CARE FACTORS FREQUENCY  PT (By licensed PT), OT (By licensed OT)     PT Frequency: 3x OT Frequency: 3x            Contractures Contractures Info: Not present    Additional Factors Info  Code Status, Allergies Code Status Info: Full Code Allergies Info: Fish Allergy           Current Medications (02/17/2017):  This is the current hospital active medication list Current Facility-Administered Medications  Medication Dose Route Frequency Provider Last Rate Last Dose  .  0.9 %  sodium chloride infusion   Intravenous Continuous Dorothea OgleIskra M Myers, MD 75 mL/hr at 02/17/17 1400    . atorvastatin (LIPITOR) tablet 20 mg  20 mg Oral q1800 Lorretta HarpXilin Niu, MD   20 mg at 02/16/17 1836  . chlorhexidine (PERIDEX) 0.12 % solution 15 mL  15 mL Mouth Rinse BID Lorretta HarpXilin Niu, MD   15 mL at 02/17/17 1125  . ciprofloxacin (CIPRO) IVPB 400 mg  400 mg Intravenous Q12H Alison MurrayAlma M Devine, MD   400 mg at 02/17/17 1117  . dextromethorphan-guaiFENesin (MUCINEX DM) 30-600 MG per 12 hr  tablet 1 tablet  1 tablet Oral BID PRN Lorretta HarpXilin Niu, MD   1 tablet at 02/16/17 1321  . feeding supplement (BOOST / RESOURCE BREEZE) liquid 1 Container  1 Container Oral BID BM Alison MurrayAlma M Devine, MD   237 mL at 02/17/17 1126  . feeding supplement (PRO-STAT SUGAR FREE 64) liquid 30 mL  30 mL Oral BID Alison MurrayAlma M Devine, MD   30 mL at 02/17/17 1125  . folic acid (FOLVITE) tablet 1 mg  1 mg Oral Daily Lorretta HarpXilin Niu, MD   1 mg at 02/17/17 1124  . heparin ADULT infusion 100 units/mL (25000 units/24050mL sodium chloride 0.45%)  1,100 Units/hr Intravenous Continuous Ann Heldlizabeth J Martin, RPH 11 mL/hr at 02/17/17 1400 1,100 Units/hr at 02/17/17 1400  . lactulose (CHRONULAC) 10 GM/15ML solution 10 g  10 g Oral Daily Alison MurrayAlma M Devine, MD   10 g at 02/16/17 1214  . levalbuterol (XOPENEX) nebulizer solution 1.25 mg  1.25 mg Nebulization Q4H PRN Dorothea OgleIskra M Myers, MD      . MEDLINE mouth rinse  15 mL Mouth Rinse q12n4p Lorretta HarpXilin Niu, MD   15 mL at 02/16/17 1636  . midodrine (PROAMATINE) tablet 10 mg  10 mg Oral TID WC Lorretta HarpXilin Niu, MD   10 mg at 02/17/17 1151  . multivitamin with minerals tablet 1 tablet  1 tablet Oral Daily Lorretta HarpXilin Niu, MD   1 tablet at 02/17/17 1124  . nicotine (NICODERM CQ - dosed in mg/24 hours) patch 21 mg  21 mg Transdermal Daily Lorretta HarpXilin Niu, MD   Stopped at 02/16/17 1000  . ondansetron (ZOFRAN) injection 4 mg  4 mg Intravenous Q8H PRN Lorretta HarpXilin Niu, MD   4 mg at 02/16/17 0604  . pantoprazole (PROTONIX) EC tablet 40 mg  40 mg Oral Q1200 Lorretta HarpXilin Niu, MD   40 mg at 02/17/17 1151  . senna-docusate (Senokot-S) tablet 1 tablet  1 tablet Oral QHS PRN Lorretta HarpXilin Niu, MD      . sodium chloride flush (NS) 0.9 % injection 3 mL  3 mL Intravenous Q12H Lorretta HarpXilin Niu, MD   3 mL at 02/16/17 2141  . thiamine (VITAMIN B-1) tablet 100 mg  100 mg Oral Daily Lorretta HarpXilin Niu, MD   100 mg at 02/17/17 1123  . Warfarin - Pharmacist Dosing Inpatient   Does not apply q1800 Ann HeldElizabeth J Martin, RPH      . zolpidem (AMBIEN) tablet 5 mg  5 mg Oral QHS PRN Lorretta HarpXilin Niu, MD          Discharge Medications: Please see discharge summary for a list of discharge medications.  Relevant Imaging Results:  Relevant Lab Results:   Additional Information SSN: [id number]  Maree KrabbeBridget A Yuliya Nova, LCSW

## 2017-02-17 NOTE — Progress Notes (Signed)
Patient ID: Jeremy Herman, male   DOB: 05/14/1946, 71 y.o.   MRN: [id number]  PROGRESS NOTE  Jeremy Herman  WUJ:[id number] DOB: December 31, 1945 DOA: 02/15/2017  PCP: Jeremy Paula, MD   Brief Narrative:  71 y.o. male with medical history significant for atrial fibrillation and DVT/PE on Coumadin, hyperlipidemia, tobacco abuse, right heart failure with pulmonary hypertension, alcoholic cirrhosis with ascites, ASD (s/p of repair), who presented to [facility] with progressive shortness breath and abdominal distention for almost a month and worse in past several days. No chest pain, fevers.  He was found to have subtherapeutic INR 1.31 on admission, negative troponin, BNP 172, negative urinalysis, mild acute renal injury with creatinine 1.22, WBC 19.0. CT angiogram of chest showed interval increase in the clot burden compared to the prior CT with associated moderate luminal narrowing. No definite occlusive thrombus/embolus identified.  Major events since admission: 3/22 - more hypotensive, HR up to 160's, WBC still up 19K, transfer to SDU and call PCCM for assistance   Assessment & Plan:  Pulmonary embolus / DVT, bilateral lower limbs (HCC) - CT angiogram of chest showed interval increase in the clot burden, no definite occlusive thrombus/embolus identified. No evidence of right heart straining - INR subtherapeutic on admission, ? Compliance with Coumadin - pt has been placed on Heparin in an anticipation for paracentesis but given hypotension, will cancel paracentesis for now - hold off on metoprolol as well until BP stabilizes  Hypotension - at home pt on Lasix and Spironolactone, as well as midodrine so suspect chronic component  - RN said pt has not gotten any metoprolol today or yesterday, has been on Midodrine here - he is still hypotensive, will transfer to SDU and consult with PCCM   Leukocytosis with tachycardia, SIRS vs sepsis, unclear etiology  - there was a concern for possible SBP - today  is day #3 of Cipro  - when able to do paracentesis, can obtain fluid analysis  - blood cultures from 3/21 pending   AKI - Likely prerenal secondary to diuretics, dehydration - Creatinine trending up from 1.26 -->1.76 - Lasix and aldactone on hold  - pt needs fluids, minimal urine output - consulted PCCM as noted above  - BMP in AM  Ascites due to alcoholic cirrhosis / Acute alcoholic encephalopathy  - Patient did not drink alcohol since January per his report  - AST and ALT normal. Total bilirubin 1.9.  - Ammonia in mid 30's - Continue protonix - was on daily lactulose   Hypokalemia - supplement, repeat BMP in AM  Atrial Fibrillation with RVR - CHA2DS2-VASc Score is 3 - On AC with coumadin but now on heparin as paracentesis was planned  - pt was started on low dose metoprolol but was not given the med due to hypotension   Thrombocytopenia - reactive - now WNL  Tobacco abuse: - Counseled on smoking cessation but his mental status is not so good so we will need to provide this counseling again when he is more oriented  - Continue nicotine patch   Pulmonary hypertension and R ventricular failure - The patient does not have leg edema. Heart failure compensated - Hold diuretics due to renal insufficiency  - Continue midodrine   Anemia of chronic disease - drop in Hg since admission but no evidence of active bleeding - CBC in AM  Malnutrition of moderate degree: - In the context of chronic illness - Nutrition consulted   Dyslipidemia  - Last LDL was 77 on 12/25/15  -  Continue Lipitor  DVT prophylaxis: on heparin drip  Code Status: full code  Family Communication: no family at the bedside this am, pt declined my offer to update any family  Disposition Plan: not yet stable for discharge, move to SDU   Consultants:   PT  Nutrition   PCCM   IR for paracentesis - cancelled until BP improves   Procedures:   None   Antimicrobials:   Cipro 02/16/2017  -->  Subjective: More hypotensive this am and says persistent dyspnea.   Objective: Vitals:   02/17/17 0847 02/17/17 0904 02/17/17 1025 02/17/17 1111  BP: (!) 87/43  (!) 94/57 (!) 82/70  Pulse: 99   (!) 121  Resp: 14     Temp:  97.5 F (36.4 C)    TempSrc:      SpO2: 99%     Weight:      Height:        Intake/Output Summary (Last 24 hours) at 02/17/17 1219 Last data filed at 02/17/17 1136  Gross per 24 hour  Intake          1524.89 ml  Output              475 ml  Net          1049.89 ml   Filed Weights   02/15/17 1630 02/16/17 0033  Weight: 64.4 kg (142 lb) 66.4 kg (146 lb 6.4 oz)    Examination:  General exam: Appears slightly confused but not in acute distress  Respiratory system: diminished, no wheezing  Cardiovascular system: tachycardic, no murmurs  Gastrointestinal system: (+) BS, fluid wave (+), distended abd, non tender  Central nervous system: No focal neurological deficits. Extremities: Symmetric 5 x 5 power.  Data Reviewed: I have personally reviewed following labs and imaging studies  CBC:  Recent Labs Lab 02/15/17 1615 02/16/17 0536 02/17/17 0329  WBC 19.0* 21.8* 19.3*  NEUTROABS 15.7*  --   --   HGB 12.6* 11.1* 10.3*  HCT 36.7* 33.3* 30.1*  MCV 104.9* 104.4* 103.4*  PLT 145* 160 152   Basic Metabolic Panel:  Recent Labs Lab 02/15/17 1615 02/16/17 0536 02/17/17 0329  NA 137 135 133*  K 3.9 3.8 3.4*  CL 101 99* 98*  CO2 22 22 22   GLUCOSE 112* 98 104*  BUN 38* 40* 45*  CREATININE 1.22 1.26* 1.76*  CALCIUM 8.2* 8.0* 7.6*   Liver Function Tests:  Recent Labs Lab 02/15/17 1615  AST 61*  ALT 33  ALKPHOS 100  BILITOT 3.0*  PROT 6.8  ALBUMIN 2.2*    Recent Labs Lab 02/16/17 0217  AMMONIA 36*   Coagulation Profile:  Recent Labs Lab 02/15/17 1615 02/16/17 0217  INR 1.31 1.55   CBG:  Recent Labs Lab 02/16/17 0749 02/17/17 0746  GLUCAP 106* 118*   Urine analysis:    Component Value Date/Time   COLORURINE  AMBER (A) 02/15/2017 2233   APPEARANCEUR HAZY (A) 02/15/2017 2233   LABSPEC 1.020 02/15/2017 2233   PHURINE 5.0 02/15/2017 2233   GLUCOSEU NEGATIVE 02/15/2017 2233   HGBUR NEGATIVE 02/15/2017 2233   BILIRUBINUR SMALL (A) 02/15/2017 2233   KETONESUR NEGATIVE 02/15/2017 2233   PROTEINUR 30 (A) 02/15/2017 2233   UROBILINOGEN 1.0 12/26/2010 0531   NITRITE NEGATIVE 02/15/2017 2233   LEUKOCYTESUR NEGATIVE 02/15/2017 2233   Radiology Studies: Dg Chest 2 View Result Date: 02/15/2017 Low lung volumes with subsegmental atelectasis in the right mid lung. No acute infiltrate or edema.   Ct Angio Chest  Pe W And/or Wo Contrast Result Date: 02/15/2017 1. Eccentric clot along the periphery of the left pulmonary artery extending into the lobar and segmental branches of the left lower lobe. There has been interval increase in the clot burden compared to the prior CT with associated moderate luminal narrowing. No definite occlusive thrombus/embolus identified. 2. Advanced coronary vascular calcification. 3. Morphologic changes of cirrhosis with partially visualized moderate upper abdominal ascites, increased since the prior CT.   Scheduled Meds: . atorvastatin  20 mg Oral q1800  . folic acid  1 mg Oral Daily  . lactulose  10 g Oral Daily  . levalbuterol  1.25 mg Nebulization Q6H  . midodrine  10 mg Oral TID WC  . multivitamin with   1 tablet Oral Daily  . nicotine  21 mg Transdermal Daily  . pantoprazole  40 mg Oral Q1200  . thiamine  100 mg Oral Daily  . Warfarin   Does not apply q1800   Continuous Infusions: . heparin 1,100 Units/hr (02/17/17 1136)     LOS: 2 days   Time spent: 25 minutes  Greater than 50% of the time spent on counseling and coordinating the care.  Debbora PrestoMAGICK-Minha Fulco, MD [facility] Pager [phone]  If 7PM-7AM, please contact night-coverage [email or web address] Password [id number] 02/17/2017, 12:19 PM

## 2017-02-17 NOTE — Consult Note (Signed)
Consultation Note Date: 02/17/2017   Patient Name: Jeremy Herman  DOB: Jun 18, 1946  MRN: [id number]  Age / Sex: 71 y.o., male  PCP: Dessa Phi, MD Referring Physician: Dorothea Ogle, MD  Reason for Consultation: Establishing goals of care and Psychosocial/spiritual support  HPI/Patient Profile: 71 y.o. 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 CT angiogram of the chest shows interval increase in clot burden but no definitive occlusive thrombus/embolus identified. He is currently on heparin and Coumadin. This was on hold in hopes that patient could have paracentesis but he was unable to do this because of hypotension. His blood pressure has continued to be quite low, tachycardia and he is now being transferred to a stepdown unit. He is alert but is only able to answer brief simple questions, yes or no. He is hiccuping almost nonstop.   Clinical Assessment and Goals of Care: I attempted to glean information regarding CODE STATUS as well as goals of care from patient since he is alert at this time. He has been quite confused since his admission secondary to hepatic encephalopathy. My concern is this may be a narrow window of opportunity for him to tell us his wishes. He is unmarried and has no children. His parents are deceased and he has one brother who lives in [place]. He was unable to give me his telephone number  Healthcare surrogate would fall to his brother in absence of an identified healthcare power of attorney. He was unable to tell me how to get in touch with his brother. Emergency contacts listed in the chart are  for his ex-wife who he reports remains a source of support     SUMMARY OF RECOMMENDATIONS   Palliative medicine to stay involved and try to ascertain location of decision maker which will be his brother. He is being transferred to a stepdown unit secondary to clinical decline I'm not clear at this point whether he is able to make decisions for himself. If his confusion worsens may need psychiatric consult to ascertain capacity I did attempt to ask him about his CODE STATUS and other wishes if he were to become more ill and he stated he wished to remain a full code. I am not sure however he fully understands the ramifications of code since she shared with me he doesn't understand fully why he is in the hospital and his diagnoses Code Status/Advance Care Planning:  Full code    Symptom Management:   Hiccups: Drug such as Reglan, baclofen, Thorazine have been used to manage intractable pickups with success. I am worried about giving patient baclofen and Thorazine given his low blood pressure. Reglan could be utilized at 5 mg IV every 8 hours; hold for diarrhea  Palliative Prophylaxis:   Aspiration, Bowel Regimen, Delirium Protocol, Frequent Pain Assessment, Oral Care and Turn Reposition  Additional Recommendations (Limitations, Scope, Preferences):  Full Scope Treatment  Psycho-social/Spiritual:   Desire for further Chaplaincy support:no  Additional Recommendations: Grief/Bereavement Support  Prognosis:   Days to weeks  in the setting of bilateral lower extremity DVTs, diastolic heart failure with pulmonary hypertension, atrial fib, alcoholic cirrhosis, now hemodynamically unstable as evidenced by hypotension and tachycardia, no urine output  Discharge Planning: To Be Determined      Primary Diagnoses: Present on Admission: . PE (pulmonary thromboembolism) (HCC) . Pulmonary hypertension . DVT, bilateral lower limbs (HCC) . Tobacco abuse . Pulmonary embolus (HCC) . Ascites  due to alcoholic cirrhosis (HCC) . Malnutrition of moderate degree . HLD (hyperlipidemia) . Ascites . Atrial fibrillation with RVR (HCC)   I have reviewed the medical record, interviewed the patient and family, and examined the patient. The following aspects are pertinent.  Past Medical History:  Diagnosis Date  . Arthritis    "right knee" (01/17/2017)  . Chronic anticoagulation   . DVT (deep venous thrombosis) (HCC) 2015; 2016   RLE; LLE  . Paroxysmal atrial fibrillation (HCC)    Hattie Perch 01/17/2017  . PE (pulmonary embolism) 2015; 2016  . Pneumonia 1969   Social History   Social History  . Marital status: Divorced    Spouse name: N/A  . Number of children: N/A  . Years of education: N/A   Social History Main Topics  . Smoking status: Current Every Day Smoker    Packs/day: 1.00    Years: 48.00    Types: Cigarettes  . Smokeless tobacco: Never Used  . Alcohol use No     Comment: Former extensive use; "Quit  in 2001" (01/17/2017)  . Drug use: No  . Sexual activity: Not Currently   Other Topics Concern  . None   Social History Narrative  . None   Family History  Problem Relation Age of Onset  . Stomach cancer Father   . Cancer Mother   . Aneurysm Mother   . Deep vein thrombosis Brother 67   Scheduled Meds: . atorvastatin  20 mg Oral q1800  . chlorhexidine  15 mL Mouth Rinse BID  . ciprofloxacin  400 mg Intravenous Q12H  . feeding supplement  1 Container Oral BID BM  . feeding supplement (PRO-STAT SUGAR FREE 64)  30 mL Oral BID  . folic acid  1 mg Oral Daily  . lactulose  10 g Oral Daily  . mouth rinse  15 mL Mouth Rinse q12n4p  . midodrine  10 mg Oral TID WC  . multivitamin with minerals  1 tablet Oral Daily  . nicotine  21 mg Transdermal Daily  . pantoprazole  40 mg Oral Q1200  . sodium chloride flush  3 mL Intravenous Q12H  . thiamine  100 mg Oral Daily  . Warfarin - Pharmacist Dosing Inpatient   Does not apply q1800   Continuous Infusions: . sodium  chloride    . heparin 1,100 Units/hr (02/17/17 1136)   PRN Meds:.dextromethorphan-guaiFENesin, levalbuterol, ondansetron, senna-docusate, zolpidem Medications Prior to Admission:  Prior to Admission medications   Medication Sig Start Date End Date Taking? Authorizing Provider  atorvastatin (LIPITOR) 20 MG tablet Take 1 tablet (20 mg total) by mouth daily at 6 PM. 01/18/17  Yes [name], MD  furosemide (LASIX) 40 MG tablet Take 1 tablet (40 mg total) by mouth daily. 01/19/17  Yes [name], MD  midodrine (PROAMATINE) 10 MG tablet Take 1 tablet (10 mg total) by mouth 3 (three) times daily with meals. Patient taking  differently: Take 10 mg by mouth daily. TAKES WITH MEALS BUT USUALLY ONLY EATS ONCE DAILY 01/18/17  Yes [name], MD  senna-docusate (SENOKOT-S) 8.6-50 MG tablet Take 1 tablet by mouth at bedtime as needed for mild constipation. 01/18/17  Yes [name], MD  spironolactone (ALDACTONE) 100 MG tablet Take 2 tablets (200 mg total) by mouth daily. 01/19/17  Yes [name], MD  warfarin (COUMADIN) 2 MG tablet Take 1 tablet by mouth every day but 2 tablets on Tuesdays and Saturdays. Patient taking differently: Take 1-2 mg by mouth See admin instructions. 1mg  On Tuesday, Thursday, Saturday - pt takes 2mg  on "Sunday, Monday, Wednesday, Friday 12/09/16  Yes Josalyn Funches, MD  feeding supplement, ENSURE ENLIVE, (ENSURE ENLIVE) LIQD Take 237 mLs by mouth 2 (two) times daily between meals. Patient not taking: Reported on 02/15/2017 01/18/17   Sumayya Amin, MD  folic acid (FOLVITE) 1 MG tablet Take 1 tablet (1 mg total) by mouth daily. Patient not taking: Reported on 01/10/2017 12/13/16   Josalyn Funches, MD  Multiple Vitamins-Minerals (MULTIVITAMIN) tablet Take 1 tablet by mouth daily. Patient not taking: Reported on 01/10/2017 12/13/16   Josalyn Funches, MD  thiamine (VITAMIN B-1) 100 MG tablet Take 1 tablet (100 mg total) by mouth daily. Patient not taking: Reported on 01/10/2017 12/13/16    Josalyn Funches, MD  traMADol (ULTRAM) 50 MG tablet Take 1 tablet (50 mg total) by mouth every 12 (twelve) hours as needed. Patient not taking: Reported on 02/15/2017 12/09/16   Josalyn Funches, MD   Allergies  Allergen Reactions  . Fish Allergy Anaphylaxis and Other (See Comments)    Diarrhea    Review of Systems  Unable to perform ROS: Mental status change    Physical Exam  Constitutional:  Cachectic, acutely ill appearing man. He is hiccuping nonstop and can only answer simple yes and no questions   Pulmonary/Chest:  Increased work of breathing secondary to hiccups  Abdominal: He exhibits distension.  Ascites  Genitourinary:  Genitourinary Comments: Foley catheter with no urine  Musculoskeletal: Normal range of motion.  Neurological: He is alert.  States he doesn't fully understand what is wrong with him  Skin:  Dry, multiple areas of ecchymoses  Nursing note and vitals reviewed.   Vital Signs: BP (!) 82/70 (BP Location: Right Arm)   Pulse (!) 121   Temp 97.5 F (36.4 C)   Resp 14   Ht 6' (1.829 m)   Wt 66.4 kg (146 lb 6.4 oz)   SpO2 99%   BMI 19.86 kg/m  Pain Assessment: No/denies pain   Pain Score: 5    SpO2: SpO2: 99 % O2 Device:SpO2: 99 % O2 Flow Rate: .O2 Flow Rate (L/min): 3 L/min  IO: Intake/output summary:  Intake/Output Summary (Last 24 hours) at 02/17/17 1257 Last data filed at 02/17/17 1136  Gross per 24 hour  Intake          1524.89 ml  Output              475 ml  Net          10" 49.89 ml    LBM: Last BM Date: 02/16/17 Baseline Weight: Weight: 64.4 kg (142 lb) Most recent weight: Weight: 66.4 kg (146 lb 6.4 oz)     Palliative Assessment/Data:   Flowsheet Rows     Most Recent Value  Intake Tab  Referral Department  Hospitalist  Unit at Time of Referral  Med/Surg Unit  Palliative Care Primary Diagnosis  Cardiac  Date  Notified  02/16/17  Palliative Care Type  New Palliative care  Reason for referral  Clarify Goals of Care  Date of  Admission  02/15/17  Date first seen by Palliative Care  02/17/17  # of days Palliative referral response time  1 Day(s)  # of days IP prior to Palliative referral  1  Clinical Assessment  Palliative Performance Scale Score  30%  Pain Max last 24 hours  Not able to report  Pain Min Last 24 hours  Not able to report  Dyspnea Max Last 24 Hours  Not able to report  Dyspnea Min Last 24 hours  Not able to report  Nausea Max Last 24 Hours  Not able to report  Nausea Min Last 24 Hours  Not able to report  Anxiety Max Last 24 Hours  Not able to report  Anxiety Min Last 24 Hours  Not able to report  Other Max Last 24 Hours  Not able to report  Psychosocial & Spiritual Assessment  Palliative Care Outcomes  Patient/Family meeting held?  Yes  Who was at the meeting?  just pt  Palliative Care follow-up planned  Yes, Facility      Time In: 1200 Time Out: 1310 Time Total: 70 min Greater than 50%  of this time was spent counseling and coordinating care related to the above assessment and plan.  Signed by: Irean HongSarah Grace Ruben Mahler, NP   Please contact Palliative Medicine Team phone at [phone] for questions and concerns.  For individual provider: See Loretha StaplerAmion

## 2017-02-18 ENCOUNTER — Inpatient Hospital Stay (HOSPITAL_COMMUNITY): Payer: Medicare Other

## 2017-02-18 LAB — BODY FLUID CELL COUNT WITH DIFFERENTIAL
EOS FL: 0 %
LYMPHS FL: 32 %
MONOCYTE-MACROPHAGE-SEROUS FLUID: 22 % — AB (ref 50–90)
Neutrophil Count, Fluid: 46 % — ABNORMAL HIGH (ref 0–25)
Total Nucleated Cell Count, Fluid: 164 cu mm (ref 0–1000)

## 2017-02-18 LAB — GRAM STAIN

## 2017-02-18 LAB — HEPATIC FUNCTION PANEL
ALBUMIN: 1.8 g/dL — AB (ref 3.5–5.0)
ALT: 27 U/L (ref 17–63)
AST: 51 U/L — ABNORMAL HIGH (ref 15–41)
Alkaline Phosphatase: 78 U/L (ref 38–126)
BILIRUBIN DIRECT: 0.6 mg/dL — AB (ref 0.1–0.5)
BILIRUBIN TOTAL: 1.1 mg/dL (ref 0.3–1.2)
Indirect Bilirubin: 0.5 mg/dL (ref 0.3–0.9)
Total Protein: 5.5 g/dL — ABNORMAL LOW (ref 6.5–8.1)

## 2017-02-18 LAB — CBC
HEMATOCRIT: 27.8 % — AB (ref 39.0–52.0)
HEMOGLOBIN: 9.7 g/dL — AB (ref 13.0–17.0)
MCH: 35.8 pg — ABNORMAL HIGH (ref 26.0–34.0)
MCHC: 34.9 g/dL (ref 30.0–36.0)
MCV: 102.6 fL — ABNORMAL HIGH (ref 78.0–100.0)
Platelets: 127 10*3/uL — ABNORMAL LOW (ref 150–400)
RBC: 2.71 MIL/uL — ABNORMAL LOW (ref 4.22–5.81)
RDW: 18 % — ABNORMAL HIGH (ref 11.5–15.5)
WBC: 14.9 10*3/uL — AB (ref 4.0–10.5)

## 2017-02-18 LAB — PROTIME-INR
INR: 1.72
Prothrombin Time: 20.4 seconds — ABNORMAL HIGH (ref 11.4–15.2)

## 2017-02-18 LAB — BASIC METABOLIC PANEL
Anion gap: 8 (ref 5–15)
BUN: 48 mg/dL — ABNORMAL HIGH (ref 6–20)
CHLORIDE: 101 mmol/L (ref 101–111)
CO2: 23 mmol/L (ref 22–32)
Calcium: 7.5 mg/dL — ABNORMAL LOW (ref 8.9–10.3)
Creatinine, Ser: 1.78 mg/dL — ABNORMAL HIGH (ref 0.61–1.24)
GFR calc non Af Amer: 37 mL/min — ABNORMAL LOW (ref >60)
GFR, EST AFRICAN AMERICAN: 43 mL/min — AB (ref >60)
Glucose, Bld: 131 mg/dL — ABNORMAL HIGH (ref 65–99)
POTASSIUM: 3.2 mmol/L — AB (ref 3.5–5.1)
Sodium: 132 mmol/L — ABNORMAL LOW (ref 135–145)

## 2017-02-18 LAB — GLUCOSE, CAPILLARY
GLUCOSE-CAPILLARY: 118 mg/dL — AB (ref 65–99)
GLUCOSE-CAPILLARY: 131 mg/dL — AB (ref 65–99)

## 2017-02-18 LAB — PROTEIN, PLEURAL OR PERITONEAL FLUID: Total protein, fluid: <3 g/dL

## 2017-02-18 LAB — HEPARIN LEVEL (UNFRACTIONATED)
Heparin Unfractionated: 0.29 IU/mL — ABNORMAL LOW (ref 0.30–0.70)
Heparin Unfractionated: 0.47 IU/mL (ref 0.30–0.70)

## 2017-02-18 LAB — ALBUMIN, PLEURAL OR PERITONEAL FLUID

## 2017-02-18 MED ORDER — POTASSIUM CHLORIDE CRYS ER 20 MEQ PO TBCR
40.0000 meq | EXTENDED_RELEASE_TABLET | Freq: Four times a day (QID) | ORAL | Status: AC
  Administered 2017-02-18 (×2): 40 meq via ORAL
  Filled 2017-02-18 (×2): qty 2

## 2017-02-18 MED ORDER — LIDOCAINE HCL (PF) 1 % IJ SOLN
INTRAMUSCULAR | Status: AC
  Filled 2017-02-18: qty 10

## 2017-02-18 MED ORDER — SIMETHICONE 80 MG PO CHEW
80.0000 mg | CHEWABLE_TABLET | Freq: Four times a day (QID) | ORAL | Status: DC | PRN
  Administered 2017-02-18 – 2017-02-21 (×3): 80 mg via ORAL
  Filled 2017-02-18 (×3): qty 1

## 2017-02-18 MED ORDER — WARFARIN SODIUM 2 MG PO TABS
2.0000 mg | ORAL_TABLET | Freq: Once | ORAL | Status: AC
  Administered 2017-02-18: 2 mg via ORAL
  Filled 2017-02-18: qty 1

## 2017-02-18 MED ORDER — FAMOTIDINE 20 MG PO TABS
40.0000 mg | ORAL_TABLET | Freq: Once | ORAL | Status: AC
  Administered 2017-02-18: 40 mg via ORAL
  Filled 2017-02-18: qty 2

## 2017-02-18 MED ORDER — MIDODRINE HCL 5 MG PO TABS: 10.0000 mg | ORAL_TABLET | Freq: Three times a day (TID) | ORAL | Status: DC

## 2017-02-18 MED ORDER — ALUM & MAG HYDROXIDE-SIMETH 200-200-20 MG/5ML PO SUSP
15.0000 mL | ORAL | Status: DC | PRN
  Administered 2017-02-18: 15 mL via ORAL
  Filled 2017-02-18 (×2): qty 30

## 2017-02-18 MED ORDER — SODIUM CHLORIDE 0.9 % IV SOLN
INTRAVENOUS | Status: AC
  Administered 2017-02-18 – 2017-02-19 (×2): via INTRAVENOUS

## 2017-02-18 MED ORDER — ALBUMIN HUMAN 5 % IV SOLN
25.0000 g | Freq: Four times a day (QID) | INTRAVENOUS | Status: AC
  Administered 2017-02-18 (×3): 25 g via INTRAVENOUS
  Filled 2017-02-18 (×2): qty 250
  Filled 2017-02-18: qty 500

## 2017-02-18 MED ORDER — METOCLOPRAMIDE HCL 5 MG/5ML PO SOLN
10.0000 mg | Freq: Once | ORAL | Status: AC
  Administered 2017-02-18: 10 mg via ORAL
  Filled 2017-02-18: qty 10

## 2017-02-18 NOTE — Progress Notes (Signed)
ANTICOAGULATION CONSULT NOTE - Follow Up Consult  Pharmacy Consult for heparin and warfarin Indication: pulmonary embolus  Labs:  Recent Labs  02/15/17 1615 02/16/17 0217  02/16/17 0536 02/16/17 1241 02/17/17 0329 02/17/17 1536 02/18/17 0342  HGB 12.6*  --   --  11.1*  --  10.3*  --  9.7*  HCT 36.7*  --   --  33.3*  --  30.1*  --  27.8*  PLT 145*  --   --  160  --  152  --  127*  LABPROT 16.3* 18.7*  --   --   --   --   --  20.4*  INR 1.31 1.55  --   --   --   --   --  1.72  HEPARINUNFRC  --   --   < > 0.40 0.31 0.31  --  0.29*  CREATININE 1.22  --   --  1.26*  --  1.76*  --  1.78*  TROPONINI  --   --   --   --   --   --  0.03*  --   < > = values in this interval not displayed.   Assessment: 71yo male with concern for PE. CT showed increased clot burden compared to prior CT but no definite thrombus/embolus identified. Has a history of PE and DVT in 2015 and 2016. -S/p paracentesis on 3/24. Per note, may pursue further paracentesis in future, but after speaking with Dr. Thedore MinsSingh, will restart warfarin 3/24 PM regardless  -HL = 0.47 therapeutic -INR 1.72 subtherapeutic -Hgb 9.7, plts 127  Goal of Therapy:  Heparin level 0.3-0.7 units/ml   Plan:  - Continue heparin at 1250 units/hr - Restart warfarin today 2mg  x1 tonight - Daily HL/INR/CBC  Gwyndolyn KaufmanKai Tyrus Wilms Bernette Redbird(Kenny), PharmD  PGY1 Pharmacy Resident Pager: [phone] 02/18/2017 2:34 PM

## 2017-02-18 NOTE — Progress Notes (Signed)
Patient ID: Jeremy Herman, male   DOB: Aug 04, 1946, 71 y.o.   MRN: [id number] Nursing staff reported indigestion and hiccups despite using Maalox earlier. Requesting simethicone. The patient is also taking Protonix 40 mg po daily and is on ondansetron 4 mg IVP every 8 hrs as needed. Will try simethicone 80 mg, single dose of famotidine 40 mg po and metoclopramide 10 mg liquid po x 1 dose.  Sanda Kleinavid Ortiz, MD [phone].

## 2017-02-18 NOTE — Progress Notes (Signed)
Pt was complaining of his butt hurting. No pressure ulcer noted on the sacrum. Foam dressing started to prevent ulcer. Pt also placed on an air mattress bed.  With all these new interventions, pt now denies pain on his sacrum.

## 2017-02-18 NOTE — Progress Notes (Signed)
Daily Progress Note   Patient Name: Jeremy Herman       Date: 02/18/2017 DOB: 05-18-1946  Age: 71 y.o. MRN#: [id number] Attending Physician: Leroy SeaPrashant K Singh, MD Primary Care Physician: Lora PaulaFUNCHES, JOSALYN C, MD Admit Date: 02/15/2017  Reason for Consultation/Follow-up: Establishing goals of care and Psychosocial/spiritual support  Subjective: Patient is more alert, concentration improved, more conversant. Patient answering questions in full sentences, making eye contact and appears to be answering relevantly. He did go down to IR and had 2 L removed and tolerated this well. He was able to share with me his conversation with Jeremy Herman regarding changing his CODE STATUS to a DO NOT RESUSCITATE. He verbalizes being comfortable with that decision but still wants to try to treat the treatable.  Patient is divorced and has no children. His parents are deceased, he has one living brother. He states he is not close to his brother. He does list his ex-wife as an emergency contact and I discussed that legally his brother would be the one to be his healthcare proxy in the event that he could not speak for himself unless we put into writing his desire to have his ex-wife be his healthcare POA. He states that it is his wishes.  Length of Stay: 3  Current Medications: Scheduled Meds:  . albumin human  25 g Intravenous Q6H  . atorvastatin  20 mg Oral q1800  . chlorhexidine  15 mL Mouth Rinse BID  . ciprofloxacin  400 mg Intravenous Q12H  . feeding supplement  1 Container Oral BID BM  . feeding supplement (PRO-STAT SUGAR FREE 64)  30 mL Oral BID  . folic acid  1 mg Oral Daily  . lactulose  10 g Oral Daily  . lidocaine (PF)      . mouth rinse  15 mL Mouth Rinse q12n4p  . midodrine  10 mg Oral TID WC  .  multivitamin with minerals  1 tablet Oral Daily  . nicotine  21 mg Transdermal Daily  . pantoprazole  40 mg Oral Q1200  . sodium chloride flush  3 mL Intravenous Q12H  . thiamine  100 mg Oral Daily  . warfarin  2 mg Oral ONCE-1800  . Warfarin - Pharmacist Dosing Inpatient   Does not apply q1800    Continuous Infusions: . sodium chloride 50  mL/hr at 02/18/17 1131  . heparin 1,250 Units/hr (02/18/17 1531)    PRN Meds: dextromethorphan-guaiFENesin, levalbuterol, ondansetron, senna-docusate, zolpidem  Physical Exam  Constitutional: He is oriented to person, place, and time.  Cachectic, ill appearing man  HENT:  Head: Normocephalic and atraumatic.  Cardiovascular:  Tachycardic  Pulmonary/Chest: Effort normal.  Abdominal: He exhibits distension.  Ascites  Musculoskeletal: Normal range of motion.  Neurological: He is alert and oriented to person, place, and time.  Skin: Skin is warm and dry.  Ecchymoses  Psychiatric:  More engaged today, better eye contact; conversant  Nursing note and vitals reviewed.           Vital Signs: BP (!) 88/63   Pulse 97   Temp 97.7 F (36.5 C) (Oral)   Resp 17   Ht 6' (1.829 m)   Wt 66.3 kg (146 lb 3.2 oz)   SpO2 100%   BMI 19.83 kg/m  SpO2: SpO2: 100 % O2 Device: O2 Device: Not Delivered O2 Flow Rate: O2 Flow Rate (L/min): 2 L/min  Intake/output summary:  Intake/Output Summary (Last 24 hours) at 02/18/17 1658 Last data filed at 02/18/17 1600  Gross per 24 hour  Intake          2909.73 ml  Output              450 ml  Net          2459.73 ml   LBM: Last BM Date: 02/17/17 Baseline Weight: Weight: 64.4 kg (142 lb) Most recent weight: Weight: 66.3 kg (146 lb 3.2 oz)       Palliative Assessment/Data:    Flowsheet Rows     Most Recent Value  Intake Tab  Referral Department  Hospitalist  Unit at Time of Referral  Med/Surg Unit  Palliative Care Primary Diagnosis  Cardiac  Date Notified  02/16/17  Palliative Care Type  New  Palliative care  Reason for referral  Clarify Goals of Care  Date of Admission  02/15/17  Date first seen by Palliative Care  02/17/17  # of days Palliative referral response time  1 Day(s)  # of days IP prior to Palliative referral  1  Clinical Assessment  Palliative Performance Scale Score  30%  Pain Max last 24 hours  Not able to report  Pain Min Last 24 hours  Not able to report  Dyspnea Max Last 24 Hours  Not able to report  Dyspnea Min Last 24 hours  Not able to report  Nausea Max Last 24 Hours  Not able to report  Nausea Min Last 24 Hours  Not able to report  Anxiety Max Last 24 Hours  Not able to report  Anxiety Min Last 24 Hours  Not able to report  Other Max Last 24 Hours  Not able to report  Psychosocial & Spiritual Assessment  Palliative Care Outcomes  Patient/Family meeting held?  Yes  Who was at the meeting?  just pt  Palliative Care follow-up planned  Yes, Facility      Patient Active Problem List   Diagnosis Date Noted  . Protein-calorie malnutrition, severe 02/17/2017  . Abdominal distension   . Palliative care encounter   . Atrial fibrillation with RVR (HCC) 02/16/2017  . PE (pulmonary thromboembolism) (HCC) 02/15/2017  . HLD (hyperlipidemia) 02/15/2017  . Generalized weakness   . Pressure injury of skin 01/17/2017  . Malnutrition of moderate degree 01/16/2017  . Pulmonary hypertension, primary (HCC)   . RVF (right ventricular failure) (HCC)   .  Ascites due to alcoholic cirrhosis (HCC)   . Atrial septal defect, secundum   . SOB (shortness of breath) 01/11/2017  . Pulmonary embolus (HCC)   . Other ascites   . Shortness of breath   . Chronic pulmonary embolism (HCC) 01/10/2017  . Ascites 01/10/2017  . Macrocytic anemia 12/13/2016  . Pulmonary hypertension 12/09/2016  . Bilateral chronic knee pain 12/09/2016  . Tremor 12/09/2016  . Fracture, intertrochanteric, left femur (HCC) 04/21/2016  . Femur fracture (HCC) 04/20/2016  . Femur fracture, left  (HCC) 04/20/2016  . Palpitations 02/09/2016  . Left hip pain   . Hypokalemia   . Megaloblastic anemia   . Thrombocytopenia (HCC)   . Hip fracture (HCC) 10/24/2015  . Tobacco abuse 10/24/2015  . Chronic anticoagulation 10/24/2015  . Hypertension 10/24/2015  . Hip fracture, right (HCC) 10/24/2015  . DVT, lower extremity (HCC)   . Smoker 07/08/2014  . 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 Bilateral pulmonary embolism (HCC) 09/17/2013  . DVT, bilateral lower limbs (HCC) 09/17/2013    Palliative Care Assessment & Plan   Patient Profile: 71 y.o. 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 CT angiogram of the chest shows interval increase in clot burden but no definitive occlusive thrombus/embolus identified. He is currently on heparin and Coumadin. This was on hold in hopes that patient could have paracentesis but he was unable to do this because of hypotension.    Recommendations/Plan:  DO NOT RESUSCITATE DO NOT INTUBATE  Continue to treat the treatable but with limited setting on no CPR, no defibrillation no intubation  Consult placed to spiritual care to initiate healthcare POA paperwork  Patient denies pain, shortness of breath  Goals of Care and Additional Recommendations:  Limitations on Scope of Treatment: No Artificial Feeding, No Surgical Procedures and No Tracheostomy  Code Status:    Code Status Orders        Start     Ordered   02/18/17 0811  Do not attempt resuscitation (DNR)  Continuous    Question Answer Comment  In the event of cardiac or respiratory ARREST Do not call a "code blue"   In the event of cardiac or respiratory ARREST Do not perform Intubation, CPR, defibrillation or ACLS   In  the event of cardiac or respiratory ARREST Use medication by any route, position, wound care, and other measures to relive pain and suffering. May use oxygen, suction and manual treatment of airway obstruction as needed for comfort.      02/18/17 0810    Code Status History    Date Active Date Inactive Code Status Order ID Comments User Context   02/15/2017 11:45 PM 02/18/2017  8:10 AM Full Code [id number]  Lorretta Harp, MD ED   01/10/2017  2:17 PM 01/18/2017  6:25 PM Full Code [id number]  Darreld Mclean, MD ED   04/20/2016  3:18 AM 04/23/2016  3:54 PM Full Code [id number]  Michael Litter, MD Inpatient   10/24/2015  3:33 AM 10/27/2015  4:18 PM Full Code [id number]  Clydie Braun, MD Inpatient   10/22/2015  3:16 PM 10/24/2015  3:33 AM Full Code [id number]  Gerhard Munch, MD ED   09/17/2013  7:49 PM 09/18/2013  3:32 PM Full Code [id number]  Alison Murray, MD Inpatient    Advance Directive Documentation  Most Recent Value  Type of Advance Directive  Living will  Pre-existing out of facility DNR order (yellow form or pink MOST form)  -  "MOST" Form in Place?  -       Prognosis:   Unable to determine  Discharge Planning:  To Be Determined  Thank you for allowing the Palliative Medicine Team to assist in the care of this patient.   Time In: 1400 Time Out: 1425 Total Time 25 min Prolonged Time Billed  no       Greater than 50%  of this time was spent counseling and coordinating care related to the above assessment and plan.  Irean Hong, NP  Please contact Palliative Medicine Team phone at [phone] for questions and concerns.

## 2017-02-18 NOTE — Progress Notes (Signed)
ANTICOAGULATION CONSULT NOTE - Follow Up Consult  Pharmacy Consult for heparin Indication: pulmonary embolus  Labs:  Recent Labs  02/15/17 1615 02/16/17 0217  02/16/17 0536 02/16/17 1241 02/17/17 0329 02/17/17 1536 02/18/17 0342  HGB 12.6*  --   --  11.1*  --  10.3*  --  9.7*  HCT 36.7*  --   --  33.3*  --  30.1*  --  27.8*  PLT 145*  --   --  160  --  152  --  127*  LABPROT 16.3* 18.7*  --   --   --   --   --  20.4*  INR 1.31 1.55  --   --   --   --   --  1.72  HEPARINUNFRC  --   --   < > 0.40 0.31 0.31  --  0.29*  CREATININE 1.22  --   --  1.26*  --  1.76*  --  1.78*  TROPONINI  --   --   --   --   --   --  0.03*  --   < > = values in this interval not displayed.   Assessment: 71yo male now slightly below goal on heparin despite rate increase; no gtt issues and no signs of bleeding per RN.  Goal of Therapy:  Heparin level 0.3-0.7 units/ml   Plan:  Will increase heparin gtt by ~2 units/kg/hr to 1250 units/hr and check level in 8hr.  Vernard GamblesVeronda Parul Porcelli, PharmD, BCPS  02/18/2017,4:40 AM

## 2017-02-18 NOTE — Procedures (Signed)
PROCEDURE SUMMARY:  Successful US guided paracentesis from LLQ.  Yielded 2.0L of cloudy yellow fluid.  No immediate complications.  Starting BP ~90s/60s. BP after 2 L removed 80/52. Procedure stopped at this time. Pt tolerated well.  Can do therapeutic paracentesis of residual ascites when BP improves and pt can tolerate large volume loss.  Specimen was sent for labs.  Brayton ElBRUNING, Esty Ahuja PA-C 02/18/2017 9:46 AM

## 2017-02-18 NOTE — Progress Notes (Signed)
Patient ID: Jeremy PaganiniJames Delahoz, male   DOB: [birth year], 71 y.o.   MRN: [id number]  PROGRESS NOTE  Jeremy PaganiniJames Moya  ION:[id number] DOB: [birth year] DOA: 02/15/2017  PCP: Lora PaulaFUNCHES, JOSALYN C, MD   Brief Narrative:   71 y.o. male with medical history significant for atrial fibrillation and DVT/PE on Coumadin, hyperlipidemia, tobacco abuse, right heart failure with pulmonary hypertension, alcoholic cirrhosis with ascites, ASD (s/p of repair), who presented to [facility] with progressive shortness breath and abdominal distention for almost a month and worse in past several days. No chest pain, fevers.  He was found to have subtherapeutic INR 1.31 on admission, negative troponin, BNP 172, negative urinalysis, mild acute renal injury with creatinine 1.22, WBC 19.0. CT angiogram of chest showed interval increase in the clot burden compared to the prior CT with associated moderate luminal narrowing. No definite occlusive thrombus/embolus identified.  Major events since admission:  3/22 - more hypotensive, HR up to 160's, WBC still up 19K, transfer to SDU and call PCCM for assistance   Assessment & Plan:  Pulmonary embolus / DVT, bilateral lower limbs (HCC) - CT angiogram of chest showed interval increase in the clot burden, no definite occlusive thrombus/embolus identified. No evidence of right heart straining,  INR was subtherapeutic on admission, ? Compliance with Coumadin, currently on heparin drip. Resume Coumadin after paracentesis done.    Hypotension Chronic  - Asymptomatic, continue supplementation with midodrine, albumin and fluid. Will tolerate systolic blood pressures around 85.   Leukocytosis with tachycardia, SIRS vs sepsis, unclear etiology  - there was a concern for possible SBP, on Cipro. Paracentesis with albumin, gentle fluids and midodrine ordered. No cough or dysuria.  AKI - prerenal component due to massive third spacing and low albumin levels, Lasix in and out on on hold Thomas gentle fluids  with albumin and midodrine. Overall prognosis is grim.  Ascites due to alcoholic cirrhosis / Acute alcoholic encephalopathy -  Patient did not drink alcohol since January per his report, outpatient GI follow-up. He is severely cachectic and wasted, currently DO NOT RESUSCITATE. Not a candidate for aggressive measures at this time. If stable outpatient GI follow-up.   Hypokalemia - supplement.  Atrial Fibrillation with RVR  - CHA2DS2-VASc Score is 3, and she on heparin drip once stable switch to Coumadin, if prognosis poor then may stop anticoagulation completely, currently off beta blocker due to low blood pressures.   Tobacco abuse -  urged to quit, on nicotine patch.    Pulmonary hypertension and R ventricular failure - supportive care only unable to diabetes due to extremely low blood pressures.   Anemia of chronic disease - stable monitor    Severe protein calorie malnutrition, cachexia and deconditioning. add protein supplement, long-term prognosis appears grim.    Dyslipidemia -  Continue Lipitor    DVT prophylaxis: on heparin drip  Code Status: full code  Family Communication: no family at the bedside this am, pt declined my offer to update any family  Disposition Plan: not yet stable for discharge, move to SDU   Consultants:   PT  Nutrition   PCCM   IR for paracentesis - cancelled until BP improves   Procedures:   None   Antimicrobials:   Cipro 02/16/2017 -->  Subjective:  Patient in bed, appears cachectic and wasted, denies any headache, no chest or abdominal pain. No shortness of breath. Had long discussions about DO NOT RESUSCITATE he is agreeable to it. He realizes his prognosis is very poor.  Objective: Vitals:  02/18/17 0936 02/18/17 0939 02/18/17 0942 02/18/17 0945  BP: 90/63 (!) 85/62 (!) 86/60 (!) 84/55  Pulse:      Resp:      Temp:      TempSrc:      SpO2:      Weight:      Height:        Intake/Output Summary (Last 24 hours) at  02/18/17 1024 Last data filed at 02/18/17 0500  Gross per 24 hour  Intake           2135.4 ml  Output              300 ml  Net           1835.4 ml   Filed Weights   02/15/17 1630 02/16/17 0033 02/18/17 0500  Weight: 64.4 kg (142 lb) 66.4 kg (146 lb 6.4 oz) 66.3 kg (146 lb 3.2 oz)    Examination:  General exam: Appears slightly confused but not in acute distress  Respiratory system: diminished, no wheezing  Cardiovascular system: tachycardic, no murmurs  Gastrointestinal system: (+) BS, fluid wave (+), distended abd, non tender  Central nervous system: No focal neurological deficits. Extremities: Symmetric 5 x 5 power.  Data Reviewed: I have personally reviewed following labs and imaging studies  CBC:  Recent Labs Lab 02/15/17 1615 02/16/17 0536 02/17/17 0329 02/18/17 0342  WBC 19.0* 21.8* 19.3* 14.9*  NEUTROABS 15.7*  --   --   --   HGB 12.6* 11.1* 10.3* 9.7*  HCT 36.7* 33.3* 30.1* 27.8*  MCV 104.9* 104.4* 103.4* 102.6*  PLT 145* 160 152 127*   Basic Metabolic Panel:  Recent Labs Lab 02/15/17 1615 02/16/17 0536 02/17/17 0329 02/18/17 0342  NA 137 135 133* 132*  K 3.9 3.8 3.4* 3.2*  CL 101 99* 98* 101  CO2 22 22 22 23   GLUCOSE 112* 98 104* 131*  BUN 38* 40* 45* 48*  CREATININE 1.22 1.26* 1.76* 1.78*  CALCIUM 8.2* 8.0* 7.6* 7.5*   Liver Function Tests:  Recent Labs Lab 02/15/17 1615 02/18/17 0906  AST 61* 51*  ALT 33 27  ALKPHOS 100 78  BILITOT 3.0* 1.1  PROT 6.8 5.5*  ALBUMIN 2.2* 1.8*    Recent Labs Lab 02/16/17 0217  AMMONIA 36*   Coagulation Profile:  Recent Labs Lab 02/15/17 1615 02/16/17 0217 02/18/17 0342  INR 1.31 1.55 1.72   CBG:  Recent Labs Lab 02/16/17 0749 02/17/17 0746 02/18/17 0736  GLUCAP 106* 118* 118*   Urine analysis:    Component Value Date/Time   COLORURINE AMBER (A) 02/15/2017 2233   APPEARANCEUR HAZY (A) 02/15/2017 2233   LABSPEC 1.020 02/15/2017 2233   PHURINE 5.0 02/15/2017 2233   GLUCOSEU  NEGATIVE 02/15/2017 2233   HGBUR NEGATIVE 02/15/2017 2233   BILIRUBINUR SMALL (A) 02/15/2017 2233   KETONESUR NEGATIVE 02/15/2017 2233   PROTEINUR 30 (A) 02/15/2017 2233   UROBILINOGEN 1.0 12/26/2010 0531   NITRITE NEGATIVE 02/15/2017 2233   LEUKOCYTESUR NEGATIVE 02/15/2017 2233   Radiology Studies: Dg Chest 2 View Result Date: 02/15/2017 Low lung volumes with subsegmental atelectasis in the right mid lung. No acute infiltrate or edema.   Ct Angio Chest Pe W And/or Wo Contrast Result Date: 02/15/2017 1. Eccentric clot along the periphery of the left pulmonary artery extending into the lobar and segmental branches of the left lower lobe. There has been interval increase in the clot burden compared to the prior CT with associated moderate luminal narrowing. No definite occlusive  thrombus/embolus identified. 2. Advanced coronary vascular calcification. 3. Morphologic changes of cirrhosis with partially visualized moderate upper abdominal ascites, increased since the prior CT.   Scheduled Meds: . atorvastatin  20 mg Oral q1800  . folic acid  1 mg Oral Daily  . lactulose  10 g Oral Daily  . levalbuterol  1.25 mg Nebulization Q6H  . midodrine  10 mg Oral TID WC  . multivitamin with   1 tablet Oral Daily  . nicotine  21 mg Transdermal Daily  . pantoprazole  40 mg Oral Q1200  . thiamine  100 mg Oral Daily  . Warfarin   Does not apply q1800   Continuous Infusions: . sodium chloride Stopped (02/18/17 0900)  . heparin 1,250 Units/hr (02/18/17 0439)     LOS: 3 days   Time spent: 25 minutes  Greater than 50% of the time spent on counseling and coordinating the care.  Signature  Leroy Sea M.D on 02/18/2017 at 10:25 AM  Between 7am to 7pm - Pager - [phone] ( page via [facility], text pages only, please mention full 10 digit call back number).  After 7pm go to [email or web address] - password [facility]  [facility]  - Office  [phone]

## 2017-02-19 ENCOUNTER — Inpatient Hospital Stay (HOSPITAL_COMMUNITY): Payer: Medicare Other

## 2017-02-19 DIAGNOSIS — I361 Nonrheumatic tricuspid (valve) insufficiency: Secondary | ICD-10-CM

## 2017-02-19 LAB — CBC
HEMATOCRIT: 25.7 % — AB (ref 39.0–52.0)
HEMOGLOBIN: 8.8 g/dL — AB (ref 13.0–17.0)
MCH: 35.1 pg — AB (ref 26.0–34.0)
MCHC: 34.2 g/dL (ref 30.0–36.0)
MCV: 102.4 fL — AB (ref 78.0–100.0)
Platelets: 114 10*3/uL — ABNORMAL LOW (ref 150–400)
RBC: 2.51 MIL/uL — AB (ref 4.22–5.81)
RDW: 17.9 % — ABNORMAL HIGH (ref 11.5–15.5)
WBC: 10.1 10*3/uL (ref 4.0–10.5)

## 2017-02-19 LAB — COMPREHENSIVE METABOLIC PANEL
ALBUMIN: 2.4 g/dL — AB (ref 3.5–5.0)
ALK PHOS: 68 U/L (ref 38–126)
ALT: 26 U/L (ref 17–63)
AST: 54 U/L — AB (ref 15–41)
Anion gap: 8 (ref 5–15)
BUN: 41 mg/dL — ABNORMAL HIGH (ref 6–20)
CALCIUM: 7.7 mg/dL — AB (ref 8.9–10.3)
CHLORIDE: 104 mmol/L (ref 101–111)
CO2: 20 mmol/L — AB (ref 22–32)
CREATININE: 1.45 mg/dL — AB (ref 0.61–1.24)
GFR calc non Af Amer: 48 mL/min — ABNORMAL LOW (ref >60)
GFR, EST AFRICAN AMERICAN: 55 mL/min — AB (ref >60)
GLUCOSE: 115 mg/dL — AB (ref 65–99)
Potassium: 3.8 mmol/L (ref 3.5–5.1)
SODIUM: 132 mmol/L — AB (ref 135–145)
Total Bilirubin: 1.4 mg/dL — ABNORMAL HIGH (ref 0.3–1.2)
Total Protein: 5.8 g/dL — ABNORMAL LOW (ref 6.5–8.1)

## 2017-02-19 LAB — PROTIME-INR
INR: 1.89
Prothrombin Time: 22 seconds — ABNORMAL HIGH (ref 11.4–15.2)

## 2017-02-19 LAB — GLUCOSE, CAPILLARY: GLUCOSE-CAPILLARY: 110 mg/dL — AB (ref 65–99)

## 2017-02-19 LAB — HEPARIN LEVEL (UNFRACTIONATED)
Heparin Unfractionated: 0.21 IU/mL — ABNORMAL LOW (ref 0.30–0.70)
Heparin Unfractionated: 0.37 IU/mL (ref 0.30–0.70)

## 2017-02-19 LAB — ECHOCARDIOGRAM COMPLETE
Height: 72 in
Weight: 2384 oz

## 2017-02-19 MED ORDER — WARFARIN SODIUM 2 MG PO TABS
2.0000 mg | ORAL_TABLET | Freq: Once | ORAL | Status: AC
  Administered 2017-02-19: 2 mg via ORAL
  Filled 2017-02-19: qty 1

## 2017-02-19 MED ORDER — SODIUM CHLORIDE 0.9 % IV BOLUS (SEPSIS)
500.0000 mL | Freq: Once | INTRAVENOUS | Status: AC
  Administered 2017-02-19: 500 mL via INTRAVENOUS

## 2017-02-19 MED ORDER — ALBUMIN HUMAN 5 % IV SOLN
25.0000 g | Freq: Four times a day (QID) | INTRAVENOUS | Status: AC
  Administered 2017-02-20 (×2): 25 g via INTRAVENOUS
  Filled 2017-02-19: qty 500
  Filled 2017-02-19 (×2): qty 250

## 2017-02-19 MED ORDER — ALBUMIN HUMAN 5 % IV SOLN: 25.0000 g | Freq: Four times a day (QID) | INTRAVENOUS | Status: DC

## 2017-02-19 MED ORDER — CIPROFLOXACIN HCL 500 MG PO TABS
500.0000 mg | ORAL_TABLET | Freq: Two times a day (BID) | ORAL | Status: DC
  Administered 2017-02-19 – 2017-02-20 (×4): 500 mg via ORAL
  Filled 2017-02-19 (×5): qty 1

## 2017-02-19 MED ORDER — DIPHENHYDRAMINE HCL 25 MG PO CAPS
50.0000 mg | ORAL_CAPSULE | Freq: Once | ORAL | Status: AC
  Administered 2017-02-19: 50 mg via ORAL
  Filled 2017-02-19: qty 2

## 2017-02-19 MED ORDER — ALBUMIN HUMAN 5 % IV SOLN
25.0000 g | Freq: Four times a day (QID) | INTRAVENOUS | Status: DC
  Filled 2017-02-19: qty 250

## 2017-02-19 NOTE — Progress Notes (Signed)
ANTICOAGULATION CONSULT NOTE - Follow Up Consult  Pharmacy Consult for heparin and warfarin Indication: pulmonary embolus  Labs:  Recent Labs  02/17/17 0329 02/17/17 1536 02/18/17 0342 02/18/17 1303 02/19/17 0311 02/19/17 1255  HGB 10.3*  --  9.7*  --  8.8*  --   HCT 30.1*  --  27.8*  --  25.7*  --   PLT 152  --  127*  --  114*  --   LABPROT  --   --  20.4*  --  22.0*  --   INR  --   --  1.72  --  1.89  --   HEPARINUNFRC 0.31  --  0.29* 0.47 0.21* 0.37  CREATININE 1.76*  --  1.78*  --  1.45*  --   TROPONINI  --  0.03*  --   --   --   --      Assessment: 71yo male with concern for PE. CT showed increased clot burden compared to prior CT but no definite thrombus/embolus identified. Has a history of PE and DVT in 2015 and 2016. -S/p paracentesis on 3/24. Per note, may pursue further paracentesis in future, but after speaking with Dr. Thedore MinsSingh, will restart warfarin 3/24 PM per orders. INR now up to 1.89 (trending up nicely). Noted pt also on cipro which can caused increased coumadin effects. Heparin level therapeutic (0.37) on gtt at 1250 units/hr. Hgb has decreased to 8.8, Plts 114. No bleeding reported per RN.  Goal of Therapy:  Heparin level 0.3-0.7 units/ml  INR 2-3   Plan:  Continue heparin at 1250 units/hr F/u 8 hr heparin level Warfarin 2mg  again today  Gwyndolyn KaufmanKai Noralyn Karim Bernette Redbird(Kenny), PharmD  PGY1 Pharmacy Resident Pager: [phone] 02/19/2017 2:41 PM

## 2017-02-19 NOTE — Progress Notes (Signed)
ANTICOAGULATION CONSULT NOTE - Follow Up Consult  Pharmacy Consult for heparin and warfarin Indication: pulmonary embolus  Labs:  Recent Labs  02/16/17 0536  02/17/17 0329 02/17/17 1536 02/18/17 0342 02/18/17 1303 02/19/17 0311  HGB 11.1*  --  10.3*  --  9.7*  --   --   HCT 33.3*  --  30.1*  --  27.8*  --   --   PLT 160  --  152  --  127*  --   --   LABPROT  --   --   --   --  20.4*  --  22.0*  INR  --   --   --   --  1.72  --  1.89  HEPARINUNFRC 0.40  < > 0.31  --  0.29* 0.47 0.21*  CREATININE 1.26*  --  1.76*  --  1.78*  --   --   TROPONINI  --   --   --  0.03*  --   --   --   < > = values in this interval not displayed.   Assessment: 71yo male with concern for PE. CT showed increased clot burden compared to prior CT but no definite thrombus/embolus identified. Has a history of PE and DVT in 2015 and 2016. -S/p paracentesis on 3/24. Per note, may pursue further paracentesis in future, but after speaking with Dr. Thedore MinsSingh, will restart warfarin 3/24 PM per orders. INR now up to 1.89 (trending up nicely). Noted pt also on cipro which can caused increased coumadin effects. Heparin level down to subtherapeutic (0.21) on gtt at 1250 units/hr but RN stated line has been messed up and she just recently fixed. No bleeding reported per RN.  Goal of Therapy:  Heparin level 0.3-0.7 units/ml  INR 2-3   Plan:  Continue heparin at 1250 units/hr F/u 8 hr heparin level Warfarin 2mg  again today  Christoper Fabianaron Pocahontas Cohenour, PharmD, BCPS Clinical pharmacist, pager [phone] 02/19/2017 5:02 AM

## 2017-02-19 NOTE — Progress Notes (Signed)
Patient ID: Jeremy Herman, male   DOB: [birth year], 71 y.o.   MRN: [id number]  PROGRESS NOTE  Jeremy Herman  [id number]:[id number] DOB: 1946-05-30 DOA: 02/15/2017  PCP: Lora Paula, MD   Brief Narrative:   71 y.o. male with medical history significant for atrial fibrillation and DVT/PE on Coumadin, hyperlipidemia, tobacco abuse, right heart failure with pulmonary hypertension, alcoholic cirrhosis with ascites, ASD (s/p of repair), who presented to [facility] with progressive shortness breath and abdominal distention for almost a month and worse in past several days. No chest pain, fevers.  He was found to have subtherapeutic INR 1.31 on admission, negative troponin, BNP 172, negative urinalysis, mild acute renal injury with creatinine 1.22, WBC 19.0. CT angiogram of chest showed interval increase in the clot burden compared to the prior CT with associated moderate luminal narrowing. No definite occlusive thrombus/embolus identified.  Major events since admission:  3/22 - more hypotensive, HR up to 160's, WBC still up 19K, transfer to SDU and call PCCM for assistance   Assessment & Plan:  Pulmonary embolus / DVT, bilateral lower limbs (HCC)  - CT angiogram of chest showed interval increase in the clot burden, no definite occlusive thrombus/embolus identified. No evidence of right heart straining,  INR was subtherapeutic on admission, ? Compliance with Coumadin, currently on heparin drip & Coumadin.   Lab Results  Component Value Date   INR 1.89 02/19/2017   INR 1.72 02/18/2017   INR 1.55 02/16/2017    Hypotension Chronic  - Asymptomatic, continue supplementation with midodrine, albumin and fluid. Will tolerate systolic blood pressures around 85.   Leukocytosis with tachycardia, SIRS vs sepsis, unclear etiology  - there was a concern for possible SBP, on Cipro. Paracentesis with albumin, gentle fluids and midodrine ordered. No cough or dysuria.  AKI - prerenal component due to massive third  spacing and low albumin levels, Lasix in and out on on hold Thomas gentle fluids with albumin and midodrine. Overall prognosis is grim.  Ascites due to alcoholic cirrhosis / Acute alcoholic encephalopathy -  Patient did not drink alcohol since January per his report, outpatient GI follow-up. He is severely cachectic and wasted, currently DO NOT RESUSCITATE. Not a candidate for aggressive measures at this time. If stable outpatient GI follow-up. He received diagnostic and therapeutic paracentesis on 02/18/2017 with 2 L of fluid removed this had SSAG > 1, no signs of infection. Another therapeutic tap will be ordered on 02/19/2017 with albumin and fluid supplementation.   Hypokalemia - supplemented.  Atrial Fibrillation with RVR  - CHA2DS2-VASc Score is 3, currently on heparin and Coumadin bridging, if prognosis poor then may stop anticoagulation completely, currently off beta blocker due to low blood pressures.   Tobacco abuse -  urged to quit, on nicotine patch.    Pulmonary hypertension and R ventricular failure - supportive care only unable to diabetes due to extremely low blood pressures.   Anemia of chronic disease - stable monitor    Severe protein calorie malnutrition, cachexia and deconditioning. add protein supplement, long-term prognosis appears grim.    Dyslipidemia -  Continue Lipitor    DVT prophylaxis: on heparin drip  Code Status: DNR  Family Communication: no family at the bedside this am, pt declined my offer to update any family  Disposition Plan: not yet stable for discharge, move to SDU   Consultants:   PT  Nutrition   PCCM   IR for paracentesis - cancelled until BP improves   Procedures:   None  Antimicrobials:   Cipro 02/16/2017 -->  Subjective:  Patient in bed, appears cachectic and wasted, denies any headache, no chest or abdominal pain. No shortness of breath. Had long discussions about DO NOT RESUSCITATE he is agreeable to it. He realizes  his prognosis is very poor.  Objective: Vitals:   02/18/17 2031 02/18/17 2358 02/19/17 0419 02/19/17 0742  BP: (!) 81/64 (!) 85/56 (!) 87/62 93/68  Pulse: (!) 112 (!) 107 (!) 107 (!) 112  Resp: 19 20 (!) 26 (!) 28  Temp: 98.7 F (37.1 C) 98.2 F (36.8 C) 98 F (36.7 C) 97.5 F (36.4 C)  TempSrc: Oral Oral Oral Oral  SpO2: 100% 100% 99% 100%  Weight:   67.6 kg (149 lb)   Height:        Intake/Output Summary (Last 24 hours) at 02/19/17 0934 Last data filed at 02/19/17 0557  Gross per 24 hour  Intake          3245.83 ml  Output              425 ml  Net          2820.83 ml   Filed Weights   02/16/17 0033 02/18/17 0500 02/19/17 0419  Weight: 66.4 kg (146 lb 6.4 oz) 66.3 kg (146 lb 3.2 oz) 67.6 kg (149 lb)    Examination:  General exam: Appears slightly confused but not in acute distress  Respiratory system: diminished, no wheezing  Cardiovascular system: tachycardic, no murmurs  Gastrointestinal system: (+) BS, fluid wave (+), distended abd, non tender  Central nervous system: No focal neurological deficits. Extremities: Symmetric 5 x 5 power.  Data Reviewed: I have personally reviewed following labs and imaging studies  CBC:  Recent Labs Lab 02/15/17 1615 02/16/17 0536 02/17/17 0329 02/18/17 0342 02/19/17 0311  WBC 19.0* 21.8* 19.3* 14.9* 10.1  NEUTROABS 15.7*  --   --   --   --   HGB 12.6* 11.1* 10.3* 9.7* 8.8*  HCT 36.7* 33.3* 30.1* 27.8* 25.7*  MCV 104.9* 104.4* 103.4* 102.6* 102.4*  PLT 145* 160 152 127* 114*   Basic Metabolic Panel:  Recent Labs Lab 02/15/17 1615 02/16/17 0536 02/17/17 0329 02/18/17 0342 02/19/17 0311  NA 137 135 133* 132* 132*  K 3.9 3.8 3.4* 3.2* 3.8  CL 101 99* 98* 101 104  CO2 22 22 22 23  20*  GLUCOSE 112* 98 104* 131* 115*  BUN 38* 40* 45* 48* 41*  CREATININE 1.22 1.26* 1.76* 1.78* 1.45*  CALCIUM 8.2* 8.0* 7.6* 7.5* 7.7*   Liver Function Tests:  Recent Labs Lab 02/15/17 1615 02/18/17 0906 02/19/17 0311  AST 61*  51* 54*  ALT 33 27 26  ALKPHOS 100 78 68  BILITOT 3.0* 1.1 1.4*  PROT 6.8 5.5* 5.8*  ALBUMIN 2.2* 1.8* 2.4*    Recent Labs Lab 02/16/17 0217  AMMONIA 36*   Coagulation Profile:  Recent Labs Lab 02/15/17 1615 02/16/17 0217 02/18/17 0342 02/19/17 0311  INR 1.31 1.55 1.72 1.89   CBG:  Recent Labs Lab 02/16/17 0749 02/17/17 0746 02/18/17 0736 02/18/17 1124 02/19/17 0741  GLUCAP 106* 118* 118* 131* 110*   Urine analysis:    Component Value Date/Time   COLORURINE AMBER (A) 02/15/2017 2233   APPEARANCEUR HAZY (A) 02/15/2017 2233   LABSPEC 1.020 02/15/2017 2233   PHURINE 5.0 02/15/2017 2233   GLUCOSEU NEGATIVE 02/15/2017 2233   HGBUR NEGATIVE 02/15/2017 2233   BILIRUBINUR SMALL (A) 02/15/2017 2233   KETONESUR NEGATIVE 02/15/2017 2233  PROTEINUR 30 (A) 02/15/2017 2233   UROBILINOGEN 1.0 12/26/2010 0531   NITRITE NEGATIVE 02/15/2017 2233   LEUKOCYTESUR NEGATIVE 02/15/2017 2233   Radiology Studies: Dg Chest 2 View Result Date: 02/15/2017 Low lung volumes with subsegmental atelectasis in the right mid lung. No acute infiltrate or edema.   Ct Angio Chest Pe W And/or Wo Contrast Result Date: 02/15/2017 1. Eccentric clot along the periphery of the left pulmonary artery extending into the lobar and segmental branches of the left lower lobe. There has been interval increase in the clot burden compared to the prior CT with associated moderate luminal narrowing. No definite occlusive thrombus/embolus identified. 2. Advanced coronary vascular calcification. 3. Morphologic changes of cirrhosis with partially visualized moderate upper abdominal ascites, increased since the prior CT.   Scheduled Meds: . atorvastatin  20 mg Oral q1800  . folic acid  1 mg Oral Daily  . lactulose  10 g Oral Daily  . levalbuterol  1.25 mg Nebulization Q6H  . midodrine  10 mg Oral TID WC  . multivitamin with   1 tablet Oral Daily  . nicotine  21 mg Transdermal Daily  . pantoprazole  40 mg Oral  Q1200  . thiamine  100 mg Oral Daily  . Warfarin   Does not apply q1800   Continuous Infusions: . heparin 1,250 Units/hr (02/18/17 1900)     LOS: 4 days   Time spent: 25 minutes  Greater than 50% of the time spent on counseling and coordinating the care.  Signature  Leroy Sea M.D on 02/19/2017 at 9:34 AM  Between 7am to 7pm - Pager - [phone] ( page via [facility], text pages only, please mention full 10 digit call back number).  After 7pm go to [email or web address] - password [facility]  [facility]  - Office  [phone]

## 2017-02-19 NOTE — Progress Notes (Signed)
Clarified with Dr. Thedore MinsSingh ok to give coumadin this pm (paracentesis 3/26). Emelda Brothershristy Damare Serano RN

## 2017-02-19 NOTE — Plan of Care (Signed)
Problem: Pain Managment: Goal: General experience of comfort will improve Outcome: Progressing Patient is able to tell you that he's in pain, where it is, the duration, if it radiates and describes how it feels but is only c/o feeling tender with palpation of his abdomen, will continue to monitor.

## 2017-02-20 ENCOUNTER — Encounter (HOSPITAL_COMMUNITY): Payer: Self-pay | Admitting: Physician Assistant

## 2017-02-20 ENCOUNTER — Inpatient Hospital Stay (HOSPITAL_COMMUNITY): Payer: Medicare Other

## 2017-02-20 DIAGNOSIS — N170 Acute kidney failure with tubular necrosis: Secondary | ICD-10-CM

## 2017-02-20 HISTORY — PX: IR GENERIC HISTORICAL: IMG1180011

## 2017-02-20 LAB — COMPREHENSIVE METABOLIC PANEL
ALBUMIN: 2.3 g/dL — AB (ref 3.5–5.0)
ALK PHOS: 71 U/L (ref 38–126)
ALT: 26 U/L (ref 17–63)
AST: 54 U/L — AB (ref 15–41)
Anion gap: 9 (ref 5–15)
BUN: 36 mg/dL — ABNORMAL HIGH (ref 6–20)
CALCIUM: 7.7 mg/dL — AB (ref 8.9–10.3)
CO2: 20 mmol/L — AB (ref 22–32)
CREATININE: 1.07 mg/dL (ref 0.61–1.24)
Chloride: 105 mmol/L (ref 101–111)
GFR calc Af Amer: >60 mL/min (ref >60)
GFR calc non Af Amer: >60 mL/min (ref >60)
GLUCOSE: 113 mg/dL — AB (ref 65–99)
Potassium: 3.4 mmol/L — ABNORMAL LOW (ref 3.5–5.1)
Sodium: 134 mmol/L — ABNORMAL LOW (ref 135–145)
Total Bilirubin: 1.3 mg/dL — ABNORMAL HIGH (ref 0.3–1.2)
Total Protein: 6 g/dL — ABNORMAL LOW (ref 6.5–8.1)

## 2017-02-20 LAB — CBC
HCT: 29.7 % — ABNORMAL LOW (ref 39.0–52.0)
Hemoglobin: 10.2 g/dL — ABNORMAL LOW (ref 13.0–17.0)
MCH: 35.5 pg — ABNORMAL HIGH (ref 26.0–34.0)
MCHC: 34.3 g/dL (ref 30.0–36.0)
MCV: 103.5 fL — AB (ref 78.0–100.0)
PLATELETS: 109 10*3/uL — AB (ref 150–400)
RBC: 2.87 MIL/uL — AB (ref 4.22–5.81)
RDW: 17.8 % — ABNORMAL HIGH (ref 11.5–15.5)
WBC: 12.3 10*3/uL — AB (ref 4.0–10.5)

## 2017-02-20 LAB — HEPARIN LEVEL (UNFRACTIONATED)
HEPARIN UNFRACTIONATED: 0.28 [IU]/mL — AB (ref 0.30–0.70)
HEPARIN UNFRACTIONATED: 0.19 [IU]/mL — AB (ref 0.30–0.70)
Heparin Unfractionated: 0.28 IU/mL — ABNORMAL LOW (ref 0.30–0.70)

## 2017-02-20 LAB — MRSA PCR SCREENING: MRSA by PCR: NEGATIVE

## 2017-02-20 LAB — PROTIME-INR
INR: 1.71
Prothrombin Time: 20.3 seconds — ABNORMAL HIGH (ref 11.4–15.2)

## 2017-02-20 LAB — GLUCOSE, CAPILLARY: Glucose-Capillary: 109 mg/dL — ABNORMAL HIGH (ref 65–99)

## 2017-02-20 MED ORDER — LIDOCAINE HCL 1 % IJ SOLN
INTRAMUSCULAR | Status: AC
  Filled 2017-02-20: qty 20

## 2017-02-20 MED ORDER — WARFARIN SODIUM 2 MG PO TABS
3.0000 mg | ORAL_TABLET | Freq: Once | ORAL | Status: AC
  Administered 2017-02-20: 18:00:00 3 mg via ORAL
  Filled 2017-02-20: qty 1

## 2017-02-20 MED ORDER — RIFAXIMIN 550 MG PO TABS
550.0000 mg | ORAL_TABLET | Freq: Two times a day (BID) | ORAL | Status: DC
  Administered 2017-02-20 – 2017-03-01 (×19): 550 mg via ORAL
  Filled 2017-02-20 (×20): qty 1

## 2017-02-20 NOTE — Progress Notes (Signed)
   02/20/17 1025  Clinical Encounter Type  Visited With Patient  Visit Type Initial  Referral From Nurse  Consult/Referral To Chaplain  Spiritual Encounters  Spiritual Needs Literature;Brochure  Advance Directives (For Healthcare)  Does Patient Have a Medical Advance Directive? No  Does patient want to make changes to medical advance directive? No - Patient declined  Pt. Seemed confused, not able to understand AD at this time.  Gave overview of AD forms and literature, left with patient.  Advised pt.to call pastoral services if wants to complete AD.  Chaplain Laelah Siravo A. Denielle Bayard  [phone]

## 2017-02-20 NOTE — Progress Notes (Signed)
  PT Cancellation Note  Patient Details Name: Jeremy Herman MRN: [id number] DOB: [birth year]   Cancelled Treatment:    Reason Eval/Treat Not Completed: Patient declined, no reason specified (pt reports fatigue and unwilling to attempt mobility or participation at this time despite encouragement and education )   Enedina FinnerMaija B Saiya Crist 02/20/2017, 10:59 AM  Delaney MeigsMaija Tabor Janese Radabaugh, PT [phone]

## 2017-02-20 NOTE — Progress Notes (Signed)
Patient ID: Magda PaganiniJames Teachey, male   DOB: 19-May-1946, 71 y.o.   MRN: [id number]  PROGRESS NOTE  Magda PaganiniJames Janowski  [id number] DOB: 19-May-1946 DOA: 02/15/2017  PCP: Lora PaulaFUNCHES, JOSALYN C, MD   Brief Narrative:   71 y.o. male with medical history significant for atrial fibrillation and DVT/PE on Coumadin, hyperlipidemia, tobacco abuse, right heart failure with pulmonary hypertension, alcoholic cirrhosis with ascites, ASD (s/p of repair), who presented to [facility] with progressive shortness breath and abdominal distention for almost a month and worse in past several days. No chest pain, fevers.  He was found to have subtherapeutic INR 1.31 on admission, negative troponin, BNP 172, negative urinalysis, mild acute renal injury with creatinine 1.22, WBC 19.0. CT angiogram of chest showed interval increase in the clot burden compared to the prior CT with associated moderate luminal narrowing. No definite occlusive thrombus/embolus identified.  Major events since admission:  3/22 - more hypotensive, HR up to 160's, WBC still up 19K, transfer to SDU and call PCCM for assistance   Assessment & Plan:  Pulmonary embolus / DVT, bilateral lower limbs (HCC)  - CT angiogram of chest showed interval increase in the clot burden, no definite occlusive thrombus/embolus identified. No evidence of right heart straining,  INR was subtherapeutic on admission, ? Compliance with Coumadin, currently on Hep/Coumadin.   Lab Results  Component Value Date   INR 1.71 02/20/2017   INR 1.89 02/19/2017   INR 1.72 02/18/2017    Hypotension Chronic  - Asymptomatic, continue supplementation with midodrine, albumin and fluid. Will tolerate systolic blood pressures around 85.   Leukocytosis with tachycardia, SIRS vs sepsis, unclear etiology  - there was a concern for possible SBP, on Cipro. Paracentesis with albumin, gentle fluids and midodrine ordered. No cough or dysuria.  AKI - prerenal component due to massive third spacing and low  albumin levels, Lasix in and out on on hold Thomas gentle fluids with albumin and midodrine. Overall prognosis is grim.  Ascites due to alcoholic cirrhosis / Acute alcoholic encephalopathy -  Patient did not drink alcohol since January per his report, outpatient GI follow-up. He is severely cachectic and wasted, currently DO NOT RESUSCITATE. Not a candidate for aggressive measures at this time. If stable outpatient GI follow-up. He received diagnostic and therapeutic paracentesis on 02/18/2017 with 2 L of fluid removed this had SSAG > 1, no signs of infection. Another therapeutic tap will be ordered on 02/20/2017 with albumin and fluid supplementation. Patient also placed on Xifaxan on 02/20/2017 for his underlying cirrhosis.   Hypokalemia - supplemented.  Atrial Fibrillation with RVR  - CHA2DS2-VASc Score is 3, currently on heparin and Coumadin bridging, if prognosis poor then may stop anticoagulation completely, currently off beta blocker due to low blood pressures.  Tobacco abuse -  urged to quit, on nicotine patch.   Pulmonary hypertension and R ventricular failure - supportive care only unable to diabetes due to extremely low blood pressures.   Anemia of chronic disease - stable monitor    Severe protein calorie malnutrition, cachexia and deconditioning. add protein supplement, long-term prognosis appears grim.   Dyslipidemia -  Continue Lipitor    DVT prophylaxis: on heparin drip  Code Status: DNR  Family Communication: no family at the bedside this am, pt declined my offer to update any family  Disposition Plan: Likely DC in 1-2 days  Consultants:   PT  Nutrition   PCCM   IR for paracentesis - cancelled until BP improves   Procedures:   None  Antimicrobials:   Cipro 02/16/2017 -->  Subjective:  Patient in bed, appears cachectic and wasted, denies any headache, no chest or abdominal pain. No shortness of breath. Had long discussions about DO NOT RESUSCITATE he is  agreeable to it. He realizes his prognosis is very poor.  Objective: Vitals:   02/19/17 2021 02/20/17 0021 02/20/17 0505 02/20/17 0804  BP: 97/65 93/71 113/82 115/80  Pulse: (!) 116 (!) 132 (!) 123 (!) 111  Resp: (!) 23 (!) 27 (!) 22 19  Temp: 99 F (37.2 C)  98 F (36.7 C) 98.6 F (37 C)  TempSrc: Oral  Oral Oral  SpO2: 99% 100% 100% 99%  Weight:   68 kg (150 lb)   Height:        Intake/Output Summary (Last 24 hours) at 02/20/17 0902 Last data filed at 02/20/17 0700  Gross per 24 hour  Intake             1296 ml  Output              500 ml  Net              796 ml   Filed Weights   02/18/17 0500 02/19/17 0419 02/20/17 0505  Weight: 66.3 kg (146 lb 3.2 oz) 67.6 kg (149 lb) 68 kg (150 lb)    Examination:  General exam: Appears slightly confused but not in acute distress  Respiratory system: diminished, no wheezing  Cardiovascular system: tachycardic, no murmurs  Gastrointestinal system: (+) BS, fluid wave (+), distended abd, non tender  Central nervous system: No focal neurological deficits. Extremities: Symmetric 5 x 5 power.  Data Reviewed: I have personally reviewed following labs and imaging studies  CBC:  Recent Labs Lab 02/15/17 1615 02/16/17 0536 02/17/17 0329 02/18/17 0342 02/19/17 0311 02/20/17 0403  WBC 19.0* 21.8* 19.3* 14.9* 10.1 12.3*  NEUTROABS 15.7*  --   --   --   --   --   HGB 12.6* 11.1* 10.3* 9.7* 8.8* 10.2*  HCT 36.7* 33.3* 30.1* 27.8* 25.7* 29.7*  MCV 104.9* 104.4* 103.4* 102.6* 102.4* 103.5*  PLT 145* 160 152 127* 114* 109*   Basic Metabolic Panel:  Recent Labs Lab 02/16/17 0536 02/17/17 0329 02/18/17 0342 02/19/17 0311 02/20/17 0403  NA 135 133* 132* 132* 134*  K 3.8 3.4* 3.2* 3.8 3.4*  CL 99* 98* 101 104 105  CO2 22 22 23  20* 20*  GLUCOSE 98 104* 131* 115* 113*  BUN 40* 45* 48* 41* 36*  CREATININE 1.26* 1.76* 1.78* 1.45* 1.07  CALCIUM 8.0* 7.6* 7.5* 7.7* 7.7*   Liver Function Tests:  Recent Labs Lab 02/15/17 1615  02/18/17 0906 02/19/17 0311 02/20/17 0403  AST 61* 51* 54* 54*  ALT 33 27 26 26   ALKPHOS 100 78 68 71  BILITOT 3.0* 1.1 1.4* 1.3*  PROT 6.8 5.5* 5.8* 6.0*  ALBUMIN 2.2* 1.8* 2.4* 2.3*    Recent Labs Lab 02/16/17 0217  AMMONIA 36*   Coagulation Profile:  Recent Labs Lab 02/15/17 1615 02/16/17 0217 02/18/17 0342 02/19/17 0311 02/20/17 0403  INR 1.31 1.55 1.72 1.89 1.71   CBG:  Recent Labs Lab 02/17/17 0746 02/18/17 0736 02/18/17 1124 02/19/17 0741 02/20/17 0725  GLUCAP 118* 118* 131* 110* 109*   Urine analysis:    Component Value Date/Time   COLORURINE AMBER (A) 02/15/2017 2233   APPEARANCEUR HAZY (A) 02/15/2017 2233   LABSPEC 1.020 02/15/2017 2233   PHURINE 5.0 02/15/2017 2233   GLUCOSEU NEGATIVE 02/15/2017 2233  HGBUR NEGATIVE 02/15/2017 2233   BILIRUBINUR SMALL (A) 02/15/2017 2233   KETONESUR NEGATIVE 02/15/2017 2233   PROTEINUR 30 (A) 02/15/2017 2233   UROBILINOGEN 1.0 12/26/2010 0531   NITRITE NEGATIVE 02/15/2017 2233   LEUKOCYTESUR NEGATIVE 02/15/2017 2233   Radiology Studies: Dg Chest 2 View Result Date: 02/15/2017 Low lung volumes with subsegmental atelectasis in the right mid lung. No acute infiltrate or edema.   Ct Angio Chest Pe W And/or Wo Contrast Result Date: 02/15/2017 1. Eccentric clot along the periphery of the left pulmonary artery extending into the lobar and segmental branches of the left lower lobe. There has been interval increase in the clot burden compared to the prior CT with associated moderate luminal narrowing. No definite occlusive thrombus/embolus identified. 2. Advanced coronary vascular calcification. 3. Morphologic changes of cirrhosis with partially visualized moderate upper abdominal ascites, increased since the prior CT.   Scheduled Meds: . atorvastatin  20 mg Oral q1800  . folic acid  1 mg Oral Daily  . lactulose  10 g Oral Daily  . levalbuterol  1.25 mg Nebulization Q6H  . midodrine  10 mg Oral TID WC  .  multivitamin with   1 tablet Oral Daily  . nicotine  21 mg Transdermal Daily  . pantoprazole  40 mg Oral Q1200  . thiamine  100 mg Oral Daily  . Warfarin   Does not apply q1800   Continuous Infusions: . heparin 1,350 Units/hr (02/20/17 0844)     LOS: 5 days   Time spent: 25 minutes  Greater than 50% of the time spent on counseling and coordinating the care.  Signature  Leroy Sea M.D on 02/20/2017 at 9:02 AM  Between 7am to 7pm - Pager - [phone] ( page via [facility], text pages only, please mention full 10 digit call back number).  After 7pm go to [email or web address] - password [facility]  [facility]  - Office  [phone]

## 2017-02-20 NOTE — Care Management Important Message (Signed)
Important Message  Patient Details  Name: Magda PaganiniJames Causey MRN: [id number] Date of Birth: 1946-03-28   Medicare Important Message Given:  Yes    Kyla BalzarineShealy, Issaiah Seabrooks Abena 02/20/2017, 4:06 PM

## 2017-02-20 NOTE — Procedures (Signed)
PROCEDURE SUMMARY:  Successful US guided paracentesis from right lateral abdomen.  Yielded 4.3 of clear yellow fluid.  No immediate complications.  Pt tolerated well.   Ruthann Angulo S Zoejane Gaulin PA-C 02/20/2017 1:15 PM

## 2017-02-20 NOTE — Progress Notes (Addendum)
ANTICOAGULATION CONSULT NOTE - Follow Up Consult  Pharmacy Consult for heparin and warfarin Indication: pulmonary embolus  Labs:  Recent Labs  02/18/17 0342  02/19/17 0311  02/20/17 0403 02/20/17 1452 02/20/17 2135  HGB 9.7*  --  8.8*  --  10.2*  --   --   HCT 27.8*  --  25.7*  --  29.7*  --   --   PLT 127*  --  114*  --  109*  --   --   LABPROT 20.4*  --  22.0*  --  20.3*  --   --   INR 1.72  --  1.89  --  1.71  --   --   HEPARINUNFRC 0.29*  < > 0.21*  < > 0.28* 0.19* 0.28*  CREATININE 1.78*  --  1.45*  --  1.07  --   --   < > = values in this interval not displayed.   Assessment: 71yo male with concern for PE. CT showed increased clot burden compared to prior CT but no definite thrombus/embolus identified. Has a history of PE and DVT in 2015 and 2016. -S/p paracentesis on 3/24 and 3/26.  - HL 0.28>0.19>0.28 remains low but current RN reports heparin was not running properly when she came in (was running all down his arm). She adjusted IV and properly running now since around 1930 (currently HL would be about a 2 hrs level).   Goal of Therapy:  Heparin level 0.3-0.7 units/ml  INR 2-3   Plan:  Continue IV heparin at 1500 units/hr Recheck level at [employer]0130   Ziggy Chanthavong S. Merilynn Finlandobertson, PharmD, [facility] Clinical Staff Pharmacist Pager [phone]  02/20/2017 10:20 PM

## 2017-02-20 NOTE — Plan of Care (Signed)
Problem: Fluid Volume: Goal: Ability to maintain a balanced intake and output will improve Outcome: Progressing Pt has Albumin running after his Paracentesis of 4 liters taken off and giving him peach resource since he likes it and it's good for him. B/P starting to come up 90-100/60's, will continue to monitor.

## 2017-02-20 NOTE — Progress Notes (Signed)
ANTICOAGULATION CONSULT NOTE - Follow Up Consult  Pharmacy Consult for heparin Indication: PE/DVT and Afib  Labs:  Recent Labs  02/17/17 1536  02/18/17 0342  02/19/17 0311 02/19/17 1255 02/20/17 0403  HGB  --   < > 9.7*  --  8.8*  --  10.2*  HCT  --   --  27.8*  --  25.7*  --  29.7*  PLT  --   --  127*  --  114*  --  109*  LABPROT  --   --  20.4*  --  22.0*  --  20.3*  INR  --   --  1.72  --  1.89  --  1.71  HEPARINUNFRC  --   --  0.29*  < > 0.21* 0.37 0.28*  CREATININE  --   --  1.78*  --  1.45*  --   --   TROPONINI 0.03*  --   --   --   --   --   --   < > = values in this interval not displayed.   Assessment: 71yo male now subtherapeutic on heparin after one level at lower end of goal.  Goal of Therapy:  Heparin level 0.3-0.7 units/ml   Plan:  Will increase heparin gtt by 1-2 units/kg/hr to 1350 units/hr and check level in 8hr.  Vernard GamblesVeronda Monterius Rolf, PharmD, BCPS  02/20/2017,4:56 AM

## 2017-02-20 NOTE — Progress Notes (Addendum)
ANTICOAGULATION CONSULT NOTE - Follow Up Consult  Pharmacy Consult for heparin and warfarin Indication: pulmonary embolus  Labs:  Recent Labs  02/17/17 1536  02/18/17 0342  02/19/17 0311 02/19/17 1255 02/20/17 0403  HGB  --   < > 9.7*  --  8.8*  --  10.2*  HCT  --   --  27.8*  --  25.7*  --  29.7*  PLT  --   --  127*  --  114*  --  109*  LABPROT  --   --  20.4*  --  22.0*  --  20.3*  INR  --   --  1.72  --  1.89  --  1.71  HEPARINUNFRC  --   --  0.29*  < > 0.21* 0.37 0.28*  CREATININE  --   --  1.78*  --  1.45*  --  1.07  TROPONINI 0.03*  --   --   --   --   --   --   < > = values in this interval not displayed.   Assessment: 71yo male with concern for PE. CT showed increased clot burden compared to prior CT but no definite thrombus/embolus identified. Has a history of PE and DVT in 2015 and 2016. -S/p paracentesis on 3/24 and 3/26.   INR subtherapeutic and down to 1.71. Noted also on ciprofloxacin which can cause increased warfarin effects. Hgb ranging from 8.8-10.2, Pltc low. No bleeding reported.  Goal of Therapy:  Heparin level 0.3-0.7 units/ml  INR 2-3   Plan:  - Warfarin 3 mg PO tonight - Heparin level pending for today - Daily heparin level, CBC, INR - Monitor for s/sx of bleeding   Loura BackJennifer [place], PharmD, BCPS Clinical Pharmacist Phone for today [phone] Main pharmacy - [phone] 02/20/2017 1:27 PM    Addendum: Heparin level is subtherapeutic at 0.19 on 1350 units/hr despite increase in rate this morning. Spoke with RN who reports no problems with infusion or bleeding.  Increase heparin drip to 1500 units/hr 6 hr heparin level  Loura BackJennifer [place], PharmD, BCPS Clinical Pharmacist 02/20/2017 3:50 PM

## 2017-02-21 ENCOUNTER — Inpatient Hospital Stay (HOSPITAL_COMMUNITY): Payer: Medicare Other

## 2017-02-21 LAB — COMPREHENSIVE METABOLIC PANEL
ALBUMIN: 2.7 g/dL — AB (ref 3.5–5.0)
ALT: 21 U/L (ref 17–63)
AST: 42 U/L — AB (ref 15–41)
Alkaline Phosphatase: 69 U/L (ref 38–126)
Anion gap: 9 (ref 5–15)
BILIRUBIN TOTAL: 1.3 mg/dL — AB (ref 0.3–1.2)
BUN: 30 mg/dL — ABNORMAL HIGH (ref 6–20)
CHLORIDE: 106 mmol/L (ref 101–111)
CO2: 21 mmol/L — ABNORMAL LOW (ref 22–32)
Calcium: 7.8 mg/dL — ABNORMAL LOW (ref 8.9–10.3)
Creatinine, Ser: 0.78 mg/dL (ref 0.61–1.24)
GFR calc Af Amer: >60 mL/min (ref >60)
GFR calc non Af Amer: >60 mL/min (ref >60)
GLUCOSE: 133 mg/dL — AB (ref 65–99)
POTASSIUM: 3.2 mmol/L — AB (ref 3.5–5.1)
SODIUM: 136 mmol/L (ref 135–145)
TOTAL PROTEIN: 5.7 g/dL — AB (ref 6.5–8.1)

## 2017-02-21 LAB — PROTIME-INR
INR: 1.69
Prothrombin Time: 20.1 seconds — ABNORMAL HIGH (ref 11.4–15.2)

## 2017-02-21 LAB — CBC
HCT: 27.7 % — ABNORMAL LOW (ref 39.0–52.0)
Hemoglobin: 9.6 g/dL — ABNORMAL LOW (ref 13.0–17.0)
MCH: 36.1 pg — ABNORMAL HIGH (ref 26.0–34.0)
MCHC: 34.7 g/dL (ref 30.0–36.0)
MCV: 104.1 fL — AB (ref 78.0–100.0)
PLATELETS: 101 10*3/uL — AB (ref 150–400)
RBC: 2.66 MIL/uL — AB (ref 4.22–5.81)
RDW: 18.2 % — AB (ref 11.5–15.5)
WBC: 10.8 10*3/uL — AB (ref 4.0–10.5)

## 2017-02-21 LAB — CULTURE, BLOOD (ROUTINE X 2)
CULTURE: NO GROWTH
Culture: NO GROWTH

## 2017-02-21 LAB — HEPARIN LEVEL (UNFRACTIONATED): HEPARIN UNFRACTIONATED: 0.2 [IU]/mL — AB (ref 0.30–0.70)

## 2017-02-21 LAB — GLUCOSE, CAPILLARY: GLUCOSE-CAPILLARY: 128 mg/dL — AB (ref 65–99)

## 2017-02-21 MED ORDER — DIGOXIN 0.25 MG/ML IJ SOLN
0.1250 mg | Freq: Four times a day (QID) | INTRAMUSCULAR | Status: AC
  Administered 2017-02-21 (×2): 0.125 mg via INTRAVENOUS
  Filled 2017-02-21 (×3): qty 2

## 2017-02-21 MED ORDER — MAGNESIUM SULFATE IN D5W 1-5 GM/100ML-% IV SOLN
1.0000 g | Freq: Once | INTRAVENOUS | Status: AC
  Administered 2017-02-21: 1 g via INTRAVENOUS
  Filled 2017-02-21: qty 100

## 2017-02-21 MED ORDER — ENOXAPARIN SODIUM 80 MG/0.8ML ~~LOC~~ SOLN
1.0000 mg/kg | Freq: Two times a day (BID) | SUBCUTANEOUS | Status: DC
  Administered 2017-02-21 – 2017-02-22 (×3): 70 mg via SUBCUTANEOUS
  Filled 2017-02-21 (×3): qty 0.8

## 2017-02-21 MED ORDER — WARFARIN SODIUM 4 MG PO TABS
4.0000 mg | ORAL_TABLET | Freq: Once | ORAL | Status: AC
  Administered 2017-02-21: 4 mg via ORAL
  Filled 2017-02-21: qty 1

## 2017-02-21 MED ORDER — METOCLOPRAMIDE HCL 5 MG/5ML PO SOLN
5.0000 mg | Freq: Three times a day (TID) | ORAL | Status: DC
  Administered 2017-02-22: 5 mg via ORAL
  Filled 2017-02-21 (×2): qty 5

## 2017-02-21 MED ORDER — SODIUM CHLORIDE 0.9 % IV SOLN
30.0000 meq | INTRAVENOUS | Status: AC
  Administered 2017-02-21 (×2): 30 meq via INTRAVENOUS
  Filled 2017-02-21 (×2): qty 15

## 2017-02-21 MED ORDER — DIGOXIN 125 MCG PO TABS
0.1250 mg | ORAL_TABLET | Freq: Every day | ORAL | Status: DC
  Administered 2017-02-22 – 2017-03-01 (×8): 0.125 mg via ORAL
  Filled 2017-02-21 (×8): qty 1

## 2017-02-21 MED ORDER — POTASSIUM CHLORIDE CRYS ER 20 MEQ PO TBCR
40.0000 meq | EXTENDED_RELEASE_TABLET | Freq: Four times a day (QID) | ORAL | Status: DC
  Administered 2017-02-21: 40 meq via ORAL
  Filled 2017-02-21: qty 2

## 2017-02-21 MED ORDER — BISACODYL 10 MG RE SUPP
10.0000 mg | Freq: Every day | RECTAL | Status: DC
  Administered 2017-02-21 – 2017-02-22 (×2): 10 mg via RECTAL
  Filled 2017-02-21 (×2): qty 1

## 2017-02-21 NOTE — Progress Notes (Signed)
Pt medicated with IV Zofran for N/V had a large emesis, will clean him up and continue to monitor.

## 2017-02-21 NOTE — Progress Notes (Signed)
Patient ID: Jeremy Herman, male   DOB: August 31, 1946, 71 y.o.   MRN: [id number]  PROGRESS NOTE  Jeremy Herman  NWG:[id number] DOB: August 31, 1946 DOA: 02/15/2017  PCP: Lora PaulaFUNCHES, JOSALYN C, MD   Brief Narrative:   71 y.o. male with medical history significant for atrial fibrillation and DVT/PE on Coumadin, hyperlipidemia, tobacco abuse, right heart failure with pulmonary hypertension, alcoholic cirrhosis with ascites, ASD (s/p of repair), who [facility] with progressive shortness breath and abdominal distention for almost a month and worse in past several days. No chest pain, fevers.  He was found to have subtherapeutic INR 1.31 on admission, negative troponin, BNP 172, negative urinalysis, mild acute renal injury with creatinine 1.22, WBC 19.0. CT angiogram of chest showed interval increase in the clot burden compared to the prior CT with associated moderate luminal narrowing. No definite occlusive thrombus/embolus identified. He was initially kept on heparin with Coumadin bridging thereafter switched to Lovenox with Coumadin bridging. INR is still subtherapeutic.  Also had significant ascites requiring to ultrasound-guided paracentesis procedures removing 6.5 L of fluid with S AAG of over 1.1, no signs of SBP. He still considerably weak and will require SNF. Social worker is arranging for placement.   Assessment & Plan:  Pulmonary embolus / DVT, bilateral lower limbs (HCC)  - CT angiogram of chest showed interval increase in the clot burden, no definite occlusive thrombus/embolus identified. No evidence of right heart straining,  INR was subtherapeutic on admission, ? Compliance with Coumadin, currently on Lovenox/Coumadin.   Lab Results  Component Value Date   INR 1.69 02/21/2017   INR 1.71 02/20/2017   INR 1.89 02/19/2017    Hypotension Chronic  - Asymptomatic, continue supplementation with midodrine, PRN albumin and fluid. Will tolerate systolic blood pressures around 85.   Leukocytosis  with tachycardia, SIRS vs sepsis, unclear etiology  - paracentesis ruled out SBP, No cough or dysuria. He appears nontoxic stop all antibiotics on 02/21/2017.  AKI - prerenal component due to massive third spacing and low albumin levels, Lasix on hold, on midodrine. Overall prognosis is grim. Palate of care is following for goals of care after discussion with patient he has chosen to become DO NOT RESUSCITATE. He understands that his prognosis is poor.  Ascites due to alcoholic cirrhosis / Acute alcoholic encephalopathy -  Patient did not drink alcohol since January per his report, outpatient GI follow-up. He is severely cachectic and wasted, currently DO NOT RESUSCITATE. Not a candidate for aggressive measures at this time. If stable outpatient GI follow-up. He received diagnostic and therapeutic paracentesis on 02/18/2017 and 02/20/2017 with total 6.5L of fluid removed had SSAG > 1, no signs of infection. Patient also placed on Xifaxan on 02/20/2017 for his underlying cirrhosis and ? compliance with lactulose.   Hypokalemia - supplemented IV & PO.  Atrial Fibrillation with RVR  - CHA2DS2-VASc Score is 3, currently Lovenox with Coumadin bridging, if prognosis remains poor then may stop anticoagulation completely in the future will defer that to PCP, currently off beta blocker due to low blood pressures. We'll give digoxin loading dose IV 2 on 02/21/2017 thereafter monitor heart rate. May require long-term digoxin if renal function stays stable.  Tobacco abuse -  urged to quit, on nicotine patch.   Pulmonary hypertension and R ventricular failure - supportive care only unable to diabetes due to extremely low blood pressures.   Anemia of chronic disease - stable monitor    Severe protein calorie malnutrition, cachexia and deconditioning. add protein supplement, long-term prognosis  appears grim.   Dyslipidemia -  Continue Lipitor  Nausea and vomiting 1 on 02/21/2017. KUB suggestive of mild  ileus, switched diet to soft, Dulcolax suppository, monitor electrolytes.      DVT prophylaxis: on heparin drip  Code Status: DNR  Family Communication: no family at the bedside this am, pt declined my offer to update any family  Disposition Plan: Likely DC in 1-2 days  Consultants:   PT  Nutrition   PCCM   IR for paracentesis - cancelled until BP improves   Procedures:   US paracentesis x 2 total 6.5lits fluid removed.  Ct Angio Chest Pe W And/or Wo Contrast - Result Date: 02/15/2017  1. Eccentric clot along the periphery of the left pulmonary artery extending into the lobar and segmental branches of the left lower lobe. There has been interval increase in the clot burden compared to the prior CT with associated moderate luminal narrowing. No definite occlusive thrombus/embolus identified. 2. Advanced coronary vascular calcification. 3. Morphologic changes of cirrhosis with partially visualized moderate upper abdominal ascites, increased since the prior CT.    Major events since admission:    3/22 - more hypotensive, HR up to 160's, WBC still up 19K, transfer to SDU and call PCCM for assistance   Antimicrobials:   Cipro 02/16/2017 --> 02/21/2017  Subjective:  Patient in bed, appears cachectic and wasted, denies any headache, no chest or abdominal pain. No shortness of breath. Had long discussions about DO NOT RESUSCITATE he is agreeable to it. He realizes his prognosis is very poor. Mildly nauseated this morning.  Objective: Vitals:   02/20/17 2019 02/21/17 0300 02/21/17 0350 02/21/17 0803  BP: (!) 84/51 (!) 83/53 (!) 100/49 94/65  Pulse: (!) 107 (!) 122 (!) 115 (!) 122  Resp: (!) 25 (!) 21 (!) 28 (!) 25  Temp: 99 F (37.2 C) 98.1 F (36.7 C)  99 F (37.2 C)  TempSrc:    Oral  SpO2: 100% 99% 100% 100%  Weight:  67.6 kg (149 lb)    Height:        Intake/Output Summary (Last 24 hours) at 02/21/17 1124 Last data filed at 02/21/17 0900  Gross per 24 hour  Intake            1231.5 ml  Output             4950 ml  Net          -3718.5 ml   Filed Weights   02/19/17 0419 02/20/17 0505 02/21/17 0300  Weight: 67.6 kg (149 lb) 68 kg (150 lb) 67.6 kg (149 lb)    Examination:  General exam: Appears slightly confused but not in acute distress  Respiratory system: diminished, no wheezing  Cardiovascular system: iRRR, tachycardic, no murmurs  Gastrointestinal system: (+) BS, fluid wave (+), distended abd, non tender  Central nervous system: No focal neurological deficits. Extremities: Symmetric 5 x 5 power.  Data Reviewed: I have personally reviewed following labs and imaging studies  CBC:  Recent Labs Lab 02/15/17 1615  02/17/17 0329 02/18/17 0342 02/19/17 0311 02/20/17 0403 02/21/17 0253  WBC 19.0*  < > 19.3* 14.9* 10.1 12.3* 10.8*  NEUTROABS 15.7*  --   --   --   --   --   --   HGB 12.6*  < > 10.3* 9.7* 8.8* 10.2* 9.6*  HCT 36.7*  < > 30.1* 27.8* 25.7* 29.7* 27.7*  MCV 104.9*  < > 103.4* 102.6* 102.4* 103.5* 104.1*  PLT 145*  < >  152 127* 114* 109* 101*  < > = values in this interval not displayed. Basic Metabolic Panel:  Recent Labs Lab 02/17/17 0329 02/18/17 0342 02/19/17 0311 02/20/17 0403 02/21/17 0253  NA 133* 132* 132* 134* 136  K 3.4* 3.2* 3.8 3.4* 3.2*  CL 98* 101 104 105 106  CO2 22 23 20* 20* 21*  GLUCOSE 104* 131* 115* 113* 133*  BUN 45* 48* 41* 36* 30*  CREATININE 1.76* 1.78* 1.45* 1.07 0.78  CALCIUM 7.6* 7.5* 7.7* 7.7* 7.8*   Liver Function Tests:  Recent Labs Lab 02/15/17 1615 02/18/17 0906 02/19/17 0311 02/20/17 0403 02/21/17 0253  AST 61* 51* 54* 54* 42*  ALT 33 27 26 26 21   ALKPHOS 100 78 68 71 69  BILITOT 3.0* 1.1 1.4* 1.3* 1.3*  PROT 6.8 5.5* 5.8* 6.0* 5.7*  ALBUMIN 2.2* 1.8* 2.4* 2.3* 2.7*    Recent Labs Lab 02/16/17 0217  AMMONIA 36*   Coagulation Profile:  Recent Labs Lab 02/16/17 0217 02/18/17 0342 02/19/17 0311 02/20/17 0403 02/21/17 0253  INR 1.55 1.72 1.89 1.71 1.69    CBG:  Recent Labs Lab 02/18/17 0736 02/18/17 1124 02/19/17 0741 02/20/17 0725 02/21/17 0757  GLUCAP 118* 131* 110* 109* 128*   Urine analysis:    Component Value Date/Time   COLORURINE AMBER (A) 02/15/2017 2233   APPEARANCEUR HAZY (A) 02/15/2017 2233   LABSPEC 1.020 02/15/2017 2233   PHURINE 5.0 02/15/2017 2233   GLUCOSEU NEGATIVE 02/15/2017 2233   HGBUR NEGATIVE 02/15/2017 2233   BILIRUBINUR SMALL (A) 02/15/2017 2233   KETONESUR NEGATIVE 02/15/2017 2233   PROTEINUR 30 (A) 02/15/2017 2233   UROBILINOGEN 1.0 12/26/2010 0531   NITRITE NEGATIVE 02/15/2017 2233   LEUKOCYTESUR NEGATIVE 02/15/2017 2233   Radiology Studies:  Dg Chest 2 View - Result Date: 02/15/2017  Low lung volumes with subsegmental atelectasis in the right mid lung. No acute infiltrate or edema.   Ct Angio Chest Pe W And/or Wo Contrast - Result Date: 02/15/2017  1. Eccentric clot along the periphery of the left pulmonary artery extending into the lobar and segmental branches of the left lower lobe. There has been interval increase in the clot burden compared to the prior CT with associated moderate luminal narrowing. No definite occlusive thrombus/embolus identified. 2. Advanced coronary vascular calcification. 3. Morphologic changes of cirrhosis with partially visualized moderate upper abdominal ascites, increased since the prior CT.    Scheduled Meds: . atorvastatin  20 mg Oral q1800  . folic acid  1 mg Oral Daily  . lactulose  10 g Oral Daily  . levalbuterol  1.25 mg Nebulization Q6H  . midodrine  10 mg Oral TID WC  . multivitamin with   1 tablet Oral Daily  . nicotine  21 mg Transdermal Daily  . pantoprazole  40 mg Oral Q1200  . thiamine  100 mg Oral Daily  . Warfarin   Does not apply q1800   Continuous Infusions:    LOS: 6 days   Time spent: 25 minutes  Greater than 50% of the time spent on counseling and coordinating the care.  Signature  Leroy Sea M.D on 02/21/2017 at 11:24  AM  Between 7am to 7pm - Pager - [phone] ( page via [name], text pages only, please mention full 10 digit call back number).  After 7pm go to [email or web address] - password [facility]  [facility]  - Office  [phone]

## 2017-02-21 NOTE — Progress Notes (Signed)
ANTICOAGULATION CONSULT NOTE - Follow Up Consult  Pharmacy Consult for heparin and warfarin Indication: pulmonary embolus  Labs:  Recent Labs  02/19/17 0311  02/20/17 0403 02/20/17 1452 02/20/17 2135 02/21/17 0253  HGB 8.8*  --  10.2*  --   --  9.6*  HCT 25.7*  --  29.7*  --   --  27.7*  PLT 114*  --  109*  --   --  101*  LABPROT 22.0*  --  20.3*  --   --  20.1*  INR 1.89  --  1.71  --   --  1.69  HEPARINUNFRC 0.21*  < > 0.28* 0.19* 0.28* 0.20*  CREATININE 1.45*  --  1.07  --   --  0.78  < > = values in this interval not displayed.   Assessment: 71yo male with concern for PE. CT showed increased clot burden compared to prior CT but no definite thrombus/embolus identified. Has a history of PE and DVT in 2015 and 2016. Heparin level remains subtherapeutic (0.2) on gtt at 1500 units/hr.  No issues with line or bleeding reported per RN. Hgb low but stable, plt trending down.  Goal of Therapy:  Heparin level 0.3-0.7 units/ml  INR 2-3   Plan:  Increase heparin to 1700 units/hr Will f/u 6 hr heparin level  Christoper Fabianaron Jeremy Herman, PharmD, BCPS Clinical pharmacist, pager [phone] 02/21/2017 4:17 AM

## 2017-02-21 NOTE — Progress Notes (Signed)
Updated report received in patient's room via [place] and [place], reviewed new orders, VS and events of the day, will assume care of patient.

## 2017-02-21 NOTE — Clinical Social Work Note (Signed)
Clinical Social Work Assessment  Patient Details  Name: Jeremy Herman MRN: [id number] Date of Birth: 03-Mar-1946  Date of referral:  02/20/17               Reason for consult:  Facility Placement                Permission sought to share information with:  Facility Industrial/product designerContact Representative Permission granted to share information::  Yes, Verbal Permission Granted  Name::        Agency::  SNF  Relationship::     Contact Information:     Housing/Transportation Living arrangements for the past 2 months:  Single Family Home Source of Information:  Patient Patient Interpreter Needed:  None Criminal Activity/Legal Involvement Pertinent to Current Situation/Hospitalization:  No - Comment as needed Significant Relationships:  Neighbor Lives with:  Self Do you feel safe going back to the place where you live?  No Need for family participation in patient care:     Care giving concerns:  Pt lives at home alone with only intermittent help from his neighbors/friends.  States that he has one neighbor who doesn't work and makes him food but not able to do much physically and another neighbor who he can call for physical assist if needed.   Social Worker assessment / plan:  CSW spoke with pt concerning PT recommendation for rehab at time of DC.  Pt somewhat confused about timing ( thought he was discharged from SNF in January but I confirmed with facility that he was there last summer).  Pt familiar with SNF and SNF referral process from last experience.  Employment status:  Retired Health and safety inspectornsurance information:  [employer] PT Recommendations:  Skilled Nursing Facility Information / Referral to community resources:  Skilled Nursing Facility  Patient/Family's Response to care:  Pt is not sure about SNF at this time- thinking he could manage at home but states it "depends on how I feel that day" whether or not he would be agreeable to SNF stay.    Patient/Family's Understanding of and Emotional Response to  Diagnosis, Current Treatment, and Prognosis:  Unclear understanding pt might be unrealistic about returning home  Emotional Assessment Appearance:  Appears stated age Attitude/Demeanor/Rapport:    Affect (typically observed):  Appropriate Orientation:  Oriented to Situation, Oriented to  Time, Oriented to Place, Oriented to Self Alcohol / Substance use:  Not Applicable Psych involvement (Current and /or in the community):  No (Comment)  Discharge Needs  Concerns to be addressed:  Care Coordination Readmission within the last 30 days:  No Current discharge risk:  Physical Impairment Barriers to Discharge:  Continued Medical Work up   Burna SisUris, Jeremy Radu H, LCSW 02/21/2017, 8:29 AM

## 2017-02-21 NOTE — Progress Notes (Signed)
Palliative:  I spoke with Shawna OrleansMelanie, RN with my palliative team. She tells me that he is feeling bad but having difficulty explaining where his discomfort is originating. He has had nausea and mild ileus as well as ascites. I went to visit Mr. Diebel but he was sleeping (and has had a rough morning) so I did not awaken. Spoke with Dr. Thedore MinsSingh and will add a few doses of Reglan for ileus. He is DNR and awaiting stabilization for transition to SNF.   No charge  Yong ChannelAlicia Dalilah Curlin, NP Palliative Medicine Team Pager # [phone] (M-F 8a-5p) Team Phone # [phone] (Nights/Weekends)

## 2017-02-21 NOTE — Care Management Note (Addendum)
Case Management Note  Patient Details  Name: Jeremy Herman MRN: [id number] Date of BirthMagda Paganini: [birth year]                   Subjective/Objective: Pt presented for SOB and abdominal distention. Plan will be for SNF once stable. CM did ask Pharmacy to see if pt could be Lovenox Coumadin Bridge to transition to facility.   Action/Plan: CSW assisting with disposition needs. CM will continue to monitor.   Expected Discharge Date:                  Expected Discharge Plan:  Skilled Nursing Facility  In-House Referral:  Clinical Social Work  Discharge planning Services  CM Consult  Post Acute Care Choice:  NA Choice offered to:  NA  DME Arranged:  N/A DME Agency:  NA  HH Arranged:  NA HH Agency:  NA  Status of Service:  Completed, signed off  If discussed at Long Length of Stay Meetings, dates discussed:    Additional Comments: 1133 02-23-17 Tomi BambergerBrenda Graves-Bigelow, RN,BSN [phone] CM did a benefits check on Eliquis and co pay will be $10.00. CM will provide pt with 30 day free card. CSW states pt is refusing SNF. Pt working with the patient at the moment. CM will go back and speak with patient.   Gala LewandowskyGraves-Bigelow, Shellee Streng Kaye, RN 02/21/2017, 2:30 PM

## 2017-02-21 NOTE — Progress Notes (Signed)
ANTICOAGULATION CONSULT NOTE - Follow Up Consult  Pharmacy Consult for heparin and warfarin Indication: pulmonary embolus  Labs:  Recent Labs  02/19/17 0311  02/20/17 0403 02/20/17 1452 02/20/17 2135 02/21/17 0253  HGB 8.8*  --  10.2*  --   --  9.6*  HCT 25.7*  --  29.7*  --   --  27.7*  PLT 114*  --  109*  --   --  101*  LABPROT 22.0*  --  20.3*  --   --  20.1*  INR 1.89  --  1.71  --   --  1.69  HEPARINUNFRC 0.21*  < > 0.28* 0.19* 0.28* 0.20*  CREATININE 1.45*  --  1.07  --   --  0.78  < > = values in this interval not displayed.   Assessment: 71yo male with concern for PE. CT showed increased clot burden compared to prior CT but no definite thrombus/embolus identified. Has a history of PE and DVT in 2015 and 2016. Heparin level low this am and rate adjusted.   Heparin changed to sq lovenox this am in preparation for discharge. INR is down this morning to 1.69.   Goal of Therapy:  Heparin level 0.3-0.7 units/ml  INR 2-3   Plan:  Change heparin to lovenox 1mg /kg q12 hours Warfarin 4mg  tonight  Would recommend transitioning patient back to home dose at discharge.   Sheppard CoilFrank Dorthia Tout PharmD., BCPS Clinical Pharmacist Pager [phone] 02/21/2017 12:20 PM

## 2017-02-22 LAB — CBC
HEMATOCRIT: 29.8 % — AB (ref 39.0–52.0)
Hemoglobin: 10.2 g/dL — ABNORMAL LOW (ref 13.0–17.0)
MCH: 35.1 pg — ABNORMAL HIGH (ref 26.0–34.0)
MCHC: 34.2 g/dL (ref 30.0–36.0)
MCV: 102.4 fL — ABNORMAL HIGH (ref 78.0–100.0)
Platelets: 109 10*3/uL — ABNORMAL LOW (ref 150–400)
RBC: 2.91 MIL/uL — AB (ref 4.22–5.81)
RDW: 18 % — AB (ref 11.5–15.5)
WBC: 14 10*3/uL — AB (ref 4.0–10.5)

## 2017-02-22 LAB — PROTIME-INR
INR: 1.54
Prothrombin Time: 18.6 seconds — ABNORMAL HIGH (ref 11.4–15.2)

## 2017-02-22 LAB — GLUCOSE, CAPILLARY
GLUCOSE-CAPILLARY: 120 mg/dL — AB (ref 65–99)
Glucose-Capillary: 139 mg/dL — ABNORMAL HIGH (ref 65–99)

## 2017-02-22 MED ORDER — POTASSIUM CHLORIDE CRYS ER 20 MEQ PO TBCR
60.0000 meq | EXTENDED_RELEASE_TABLET | Freq: Four times a day (QID) | ORAL | Status: AC
  Administered 2017-02-22 (×2): 60 meq via ORAL
  Filled 2017-02-22 (×2): qty 3

## 2017-02-22 MED ORDER — WARFARIN SODIUM 4 MG PO TABS: 4.0000 mg | ORAL_TABLET | Freq: Once | ORAL | Status: DC

## 2017-02-22 MED ORDER — APIXABAN 5 MG PO TABS: 5.0000 mg | ORAL_TABLET | Freq: Two times a day (BID) | ORAL | Status: DC

## 2017-02-22 MED ORDER — MAGNESIUM SULFATE 2 GM/50ML IV SOLN
2.0000 g | Freq: Once | INTRAVENOUS | Status: AC
  Administered 2017-02-22: 2 g via INTRAVENOUS
  Filled 2017-02-22: qty 50

## 2017-02-22 MED ORDER — SENNOSIDES-DOCUSATE SODIUM 8.6-50 MG PO TABS
2.0000 | ORAL_TABLET | Freq: Two times a day (BID) | ORAL | Status: DC
  Administered 2017-02-22 – 2017-02-28 (×13): 2 via ORAL
  Filled 2017-02-22 (×15): qty 2

## 2017-02-22 MED ORDER — LACTULOSE 10 GM/15ML PO SOLN
10.0000 g | Freq: Three times a day (TID) | ORAL | Status: DC
  Administered 2017-02-22 – 2017-02-28 (×17): 10 g via ORAL
  Filled 2017-02-22 (×19): qty 15

## 2017-02-22 MED ORDER — APIXABAN 5 MG PO TABS
10.0000 mg | ORAL_TABLET | Freq: Two times a day (BID) | ORAL | Status: AC
  Administered 2017-02-22 – 2017-03-01 (×14): 10 mg via ORAL
  Filled 2017-02-22 (×14): qty 2

## 2017-02-22 NOTE — Progress Notes (Signed)
Patient ID: Jeremy Herman, male   DOB: Jan 05, 1946, 71 y.o.   MRN: [id number]  PROGRESS NOTE  Jeremy Herman  WUJ:[id number] DOB: [birth year] DOA: 02/15/2017  PCP: Lora Paula, MD   Subjective:  Denies any complaints this morning, no nausea or vomiting.  Brief Narrative:  71 y.o. male with medical history significant for atrial fibrillation and DVT/PE on Coumadin, hyperlipidemia, tobacco abuse, right heart failure with pulmonary hypertension, alcoholic cirrhosis with ascites, ASD (s/p of repair), who presented to [facility] with progressive shortness breath and abdominal distention for almost a month and worse in past several days. No chest pain, fevers.  He was found to have subtherapeutic INR 1.31 on admission, negative troponin, BNP 172, negative urinalysis, mild acute renal injury with creatinine 1.22, WBC 19.0. CT angiogram of chest showed interval increase in the clot burden compared to the prior CT with associated moderate luminal narrowing. No definite occlusive thrombus/embolus identified.  Also had significant ascites requiring to ultrasound-guided paracentesis procedures removing 6.5 L of fluid with S AAG of over 1.1, no signs of SBP. He still considerably weak and will require SNF. Social worker is arranging for placement.   Assessment & Plan:  Pulmonary embolus / DVT, bilateral lower limbs (HCC)  CT angiogram of chest showed interval increase in the clot burden, no definite occlusive thrombus/embolus identified.  No evidence of right heart straining,  INR was subtherapeutic on admission, ? Compliance with Coumadin Restarted on Lovenox bridge/Coumadin, I will discontinue and start on oral Eliquis.  Hypotension Chronic  Asymptomatic, continue supplementation with midodrine, PRN albumin and fluid. Will tolerate systolic blood pressures around 85.   Leukocytosis with tachycardia, SIRS vs sepsis, unclear etiology   Paracentesis ruled out SBP, No cough or dysuria. He appears nontoxic  stop all antibiotics on 02/21/2017.  AKI - prerenal component due to massive third spacing and low albumin levels, Lasix on hold, on midodrine. Overall prognosis is grim. Palate of care is following for goals of care after discussion with patient he has chosen to become DO NOT RESUSCITATE. He understands that his prognosis is poor.  Ascites due to alcoholic cirrhosis / Acute alcoholic encephalopathy -  Patient did not drink alcohol since January per his report, outpatient GI follow-up. He is severely cachectic and wasted, currently DO NOT RESUSCITATE. Not a candidate for aggressive measures at this time. If stable outpatient GI follow-up. He received diagnostic and therapeutic paracentesis on 02/18/2017 and 02/20/2017 with total 6.5L of fluid removed had SAAG > 1, no signs of infection. Patient also placed on Xifaxan on 02/20/2017 for his underlying cirrhosis and ? compliance with lactulose.   Hypokalemia - supplemented IV & PO.  Atrial Fibrillation with RVR  - CHA2DS2-VASc Score is 3, currently Lovenox with Coumadin bridging, if prognosis remains poor then may stop anticoagulation completely in the future will defer that to PCP, currently off beta blocker due to low blood pressures. On digoxin. I we'll change his anticoagulation to Eliquis and likely discharge in a.m. to nursing home.  Tobacco abuse -  urged to quit, on nicotine patch.   Pulmonary hypertension and R ventricular failure - supportive care only unable to diabetes due to extremely low blood pressures.   Anemia of chronic disease - stable monitor    Severe protein calorie malnutrition, cachexia and deconditioning. add protein supplement, long-term prognosis appears grim.   Dyslipidemia -  Continue Lipitor  Nausea and vomiting 1 on 02/21/2017. KUB suggestive of mild ileus, switched diet to soft, Dulcolax suppository, monitor electrolytes. Had a  bowel movement on the 27th. place on bowel regimen      DVT prophylaxis: on  heparin drip  Code Status: DNR  Family Communication: no family at the bedside this am, pt declined my offer to update any family  Disposition Plan: Likely DC in 1-2 days  Consultants:   PT  Nutrition   PCCM   IR for paracentesis - cancelled until BP improves   Procedures:   US paracentesis x 2 total 6.5lits fluid removed.  Ct Angio Chest Pe W And/or Wo Contrast - Result Date: 02/15/2017  1. Eccentric clot along the periphery of the left pulmonary artery extending into the lobar and segmental branches of the left lower lobe. There has been interval increase in the clot burden compared to the prior CT with associated moderate luminal narrowing. No definite occlusive thrombus/embolus identified. 2. Advanced coronary vascular calcification. 3. Morphologic changes of cirrhosis with partially visualized moderate upper abdominal ascites, increased since the prior CT.    Major events since admission:    3/22 - more hypotensive, HR up to 160's, WBC still up 19K, transfer to SDU and call PCCM for assistance   Antimicrobials:   Cipro 02/16/2017 --> 02/21/2017   Objective: Vitals:   02/22/17 0006 02/22/17 0208 02/22/17 0422 02/22/17 0741  BP: 101/64 94/68 102/65 110/75  Pulse: (!) 122 (!) 119 (!) 115 (!) 119  Resp: 18 (!) 22 (!) 23 (!) 23  Temp: 98.9 F (37.2 C)  98.6 F (37 C) 98.9 F (37.2 C)  TempSrc: Oral  Oral Oral  SpO2: 100% 100% 100% 100%  Weight:   68 kg (150 lb)   Height:        Intake/Output Summary (Last 24 hours) at 02/22/17 1114 Last data filed at 02/22/17 1610  Gross per 24 hour  Intake              987 ml  Output              900 ml  Net               87 ml   Filed Weights   02/20/17 0505 02/21/17 0300 02/22/17 0422  Weight: 68 kg (150 lb) 67.6 kg (149 lb) 68 kg (150 lb)    Examination:  General exam: Appears slightly confused but not in acute distress  Respiratory system: diminished, no wheezing  Cardiovascular system: iRRR, tachycardic, no murmurs   Gastrointestinal system: (+) BS, fluid wave (+), distended abd, non tender  Central nervous system: No focal neurological deficits. Extremities: Symmetric 5 x 5 power.  Data Reviewed: I have personally reviewed following labs and imaging studies  CBC:  Recent Labs Lab 02/15/17 1615  02/18/17 0342 02/19/17 0311 02/20/17 0403 02/21/17 0253 02/22/17 0443  WBC 19.0*  < > 14.9* 10.1 12.3* 10.8* 14.0*  NEUTROABS 15.7*  --   --   --   --   --   --   HGB 12.6*  < > 9.7* 8.8* 10.2* 9.6* 10.2*  HCT 36.7*  < > 27.8* 25.7* 29.7* 27.7* 29.8*  MCV 104.9*  < > 102.6* 102.4* 103.5* 104.1* 102.4*  PLT 145*  < > 127* 114* 109* 101* 109*  < > = values in this interval not displayed. Basic Metabolic Panel:  Recent Labs Lab 02/17/17 0329 02/18/17 0342 02/19/17 0311 02/20/17 0403 02/21/17 0253  NA 133* 132* 132* 134* 136  K 3.4* 3.2* 3.8 3.4* 3.2*  CL 98* 101 104 105 106  CO2 22 23  20* 20* 21*  GLUCOSE 104* 131* 115* 113* 133*  BUN 45* 48* 41* 36* 30*  CREATININE 1.76* 1.78* 1.45* 1.07 0.78  CALCIUM 7.6* 7.5* 7.7* 7.7* 7.8*   Liver Function Tests:  Recent Labs Lab 02/15/17 1615 02/18/17 0906 02/19/17 0311 02/20/17 0403 02/21/17 0253  AST 61* 51* 54* 54* 42*  ALT 33 27 26 26 21   ALKPHOS 100 78 68 71 69  BILITOT 3.0* 1.1 1.4* 1.3* 1.3*  PROT 6.8 5.5* 5.8* 6.0* 5.7*  ALBUMIN 2.2* 1.8* 2.4* 2.3* 2.7*    Recent Labs Lab 02/16/17 0217  AMMONIA 36*   Coagulation Profile:  Recent Labs Lab 02/18/17 0342 02/19/17 0311 02/20/17 0403 02/21/17 0253 02/22/17 0443  INR 1.72 1.89 1.71 1.69 1.54   CBG:  Recent Labs Lab 02/19/17 0741 02/20/17 0725 02/21/17 0757 02/22/17 0606 02/22/17 0739  GLUCAP 110* 109* 128* 139* 120*   Urine analysis:    Component Value Date/Time   COLORURINE AMBER (A) 02/15/2017 2233   APPEARANCEUR HAZY (A) 02/15/2017 2233   LABSPEC 1.020 02/15/2017 2233   PHURINE 5.0 02/15/2017 2233   GLUCOSEU NEGATIVE 02/15/2017 2233   HGBUR NEGATIVE  02/15/2017 2233   BILIRUBINUR SMALL (A) 02/15/2017 2233   KETONESUR NEGATIVE 02/15/2017 2233   PROTEINUR 30 (A) 02/15/2017 2233   UROBILINOGEN 1.0 12/26/2010 0531   NITRITE NEGATIVE 02/15/2017 2233   LEUKOCYTESUR NEGATIVE 02/15/2017 2233   Radiology Studies:  Dg Chest 2 View - Result Date: 02/15/2017  Low lung volumes with subsegmental atelectasis in the right mid lung. No acute infiltrate or edema.   Ct Angio Chest Pe W And/or Wo Contrast - Result Date: 02/15/2017  1. Eccentric clot along the periphery of the left pulmonary artery extending into the lobar and segmental branches of the left lower lobe. There has been interval increase in the clot burden compared to the prior CT with associated moderate luminal narrowing. No definite occlusive thrombus/embolus identified. 2. Advanced coronary vascular calcification. 3. Morphologic changes of cirrhosis with partially visualized moderate upper abdominal ascites, increased since the prior CT.    Scheduled Meds: . atorvastatin  20 mg Oral q1800  . folic acid  1 mg Oral Daily  . lactulose  10 g Oral Daily  . levalbuterol  1.25 mg Nebulization Q6H  . midodrine  10 mg Oral TID WC  . multivitamin with   1 tablet Oral Daily  . nicotine  21 mg Transdermal Daily  . pantoprazole  40 mg Oral Q1200  . thiamine  100 mg Oral Daily  . Warfarin   Does not apply q1800   Continuous Infusions:    LOS: 7 days   Time spent: 25 minutes  Greater than 50% of the time spent on counseling and coordinating the care.  Signature  Sicilia Killough A M.D on 02/22/2017 at 11:14 AM  Between 7am to 7pm - Pager - [phone] ( page via [facility], text pages only, please mention full 10 digit call back number).  After 7pm go to [email or web address] - password [email or web address]  [facility]  - Office  [phone]

## 2017-02-22 NOTE — Progress Notes (Signed)
Clinical Social Worker met patient at bedside to discuss SNF placement. Patient stated he lives in an apartment by himself and has very supportive neighbors. Patient stated his neighbors do everything for him. Patient stated he does not want to go to a SNF but would rather go back home. Patient is aware of his medical condition but still refusing SNF. CSW is signing off at this time since patient has no more needs.  Rhea Pink, MSW,  [place]

## 2017-02-22 NOTE — Discharge Instructions (Signed)
Information on my medicine - ELIQUIS (apixaban) Why was Eliquis prescribed for you? Eliquis was prescribed to treat blood clots that may have been found in the veins of your legs (deep vein thrombosis) or in your lungs (pulmonary embolism) and to reduce the risk of them occurring again.  What do You need to know about Eliquis ? The starting dose is 10 mg (two 5 mg tablets) taken TWICE daily for the FIRST SEVEN (7) DAYS, then on 03/01/17 at 10pm the dose is reduced to ONE 5 mg tablet taken TWICE daily.  Eliquis may be taken with or without food.   Try to take the dose about the same time in the morning and in the evening. If you have difficulty swallowing the tablet whole please discuss with your pharmacist how to take the medication safely.  Take Eliquis exactly as prescribed and DO NOT stop taking Eliquis without talking to the doctor who prescribed the medication.  Stopping may increase your risk of developing a new blood clot.  Refill your prescription before you run out.  After discharge, you should have regular check-up appointments with your healthcare provider that is prescribing your Eliquis.    What do you do if you miss a dose? If a dose of ELIQUIS is not taken at the scheduled time, take it as soon as possible on the same day and twice-daily administration should be resumed. The dose should not be doubled to make up for a missed dose.  Important Safety Information A possible side effect of Eliquis is bleeding. You should call your healthcare provider right away if you experience any of the following: ? Bleeding from an injury or your nose that does not stop. ? Unusual colored urine (red or dark brown) or unusual colored stools (red or black). ? Unusual bruising for unknown reasons. ? A serious fall or if you hit your head (even if there is no bleeding).  Some medicines may interact with Eliquis and might increase your risk of bleeding or clotting while on Eliquis. To help  avoid this, consult your healthcare provider or pharmacist prior to using any new prescription or non-prescription medications, including herbals, vitamins, non-steroidal anti-inflammatory drugs (NSAIDs) and supplements.  This website has more information on Eliquis (apixaban): http://www.eliquis.com/eliquis/home

## 2017-02-22 NOTE — Progress Notes (Signed)
Nutrition Follow-up  DOCUMENTATION CODES:   Severe malnutrition in context of chronic illness  INTERVENTION:   Continue Boost Breeze po BID, each supplement provides 250 kcal and 9 grams of protein  Continue 30 ml Prostat po BID, each supplement provides 100 kcal and 15 grams of protein.   NUTRITION DIAGNOSIS:   Malnutrition related to chronic illness as evidenced by energy intake < or equal to 75% for > or equal to 1 month, severe depletion of body fat, severe depletion of muscle mass.  Ongoing  GOAL:   Patient will meet greater than or equal to 90% of their needs  Unmet  MONITOR:   PO intake, Supplement acceptance, Labs, Weight trends, Skin, I & O's  REASON FOR ASSESSMENT:   Consult Assessment of nutrition requirement/status  ASSESSMENT:   71 y.o. male with medical history significant for atrial fibrillation and DVT/PE on Coumadin, hyperlipidemia, tobacco abuse, right heart failure with pulmonary hypertension, alcoholic cirrhosis with ascites, ASD (s/p of repair), [facility] progressive shortness breath and abdominal distention for almost a month and worse in past several days.   Poor PO intake persists. Patient is consuming <25% of all meals. He is receiving Boost Breeze and Prostat BID to maximize intake. Labs and medications reviewed.  Diet Order:  DIET SOFT Room service appropriate? Yes; Fluid consistency: Thin  Skin:  Reviewed, no issues  Last BM:  3/27  Height:   Ht Readings from Last 1 Encounters:  02/16/17 6' (1.829 m)    Weight:   Wt Readings from Last 1 Encounters:  02/22/17 150 lb (68 kg)    Ideal Body Weight:  80.9 kg  BMI:  Body mass index is 20.34 kg/m.  Estimated Nutritional Needs:   Kcal:  1950-2150  Protein:  95-105 grams  Fluid:  Per MD  EDUCATION NEEDS:   No education needs identified at this time  Joaquin CourtsKimberly Bayan Hedstrom, RD, LDN, CNSC Pager [phone] After Hours Pager [phone]

## 2017-02-22 NOTE — Progress Notes (Signed)
qPhysical Therapy Treatment Patient Details Name: Jeremy Herman MRN: [id number] DOB: 1946/10/31 Today's Date: 02/22/2017    History of Present Illness  Jeremy Herman is a 71 y.o. male with medical history significant of atrial fibrillation and DVT/PE on Coumadin, hyperlipidemia, tobacco abuse, right heart failure with pulmonary hypertension, alcoholic cirrhosis with ascites, ASD (s/p of repair), who presents with worsening shortness breath and worsening abdominal distention.  CT showing PE, paracentesis pending.    PT Comments    Pt performed increased activity and after max cueing patient agreeable to sit in the recliner chair.  Pt reports he is ready to return home and claims he will have help at home.  Pt is unsafe to return home in his current condition and he is severely deconditioned at this time.  Pt is a high fall risk if he returns home unsupervised.      Follow Up Recommendations  SNF     Equipment Recommendations  Other (comment) (TBA)    Recommendations for Other Services       Precautions / Restrictions Precautions Precautions: Fall Restrictions Weight Bearing Restrictions: No    Mobility  Bed Mobility Overal bed mobility: Needs Assistance Bed Mobility: Supine to Sit     Supine to sit: Min assist     General bed mobility comments: painfully slow movement, need for frequent redirection.  Some scooting assist and assist coming up and forward.  Transfers Overall transfer level: Needs assistance Equipment used: None Transfers: Sit to/from Stand Sit to Stand: Min assist         General transfer comment: Pt performed bed to chair.  Pt required max VCs cues to agree to move bed to chair.  Pt required increased time to move from bed to chair with several pauses to turn and pivot to recliner chair.    Ambulation/Gait Ambulation/Gait assistance: Mod assist steps to recliner   Assistive device: None (HHA from therapist. ) Gait Pattern/deviations: Trunk  flexed;Decreased stride length;Shuffle   Gait velocity interpretation: Below normal speed for age/gender General Gait Details: slow and mildly unsteady gait.  HR elevated to 130s and patient O2 sats remain 92%-100%.     Stairs            Wheelchair Mobility    Modified Rankin (Stroke Patients Only)       Balance Overall balance assessment: Needs assistance   Sitting balance-Leahy Scale: Fair       Standing balance-Leahy Scale: Poor                              Cognition Arousal/Alertness: Awake/alert;Lethargic Behavior During Therapy: Restless Overall Cognitive Status: Impaired/Different from baseline Area of Impairment: Attention;Following commands;Awareness;Problem solving                   Current Attention Level: Focused;Sustained   Following Commands: Follows one step commands with increased time   Awareness: Intellectual Problem Solving: Slow processing;Decreased initiation        Exercises      General Comments        Pertinent Vitals/Pain Pain Assessment: Faces Faces Pain Scale: Hurts little more Pain Location: generalized Pain Descriptors / Indicators: Grimacing;Guarding;Sore Pain Intervention(s): Monitored during session;Repositioned    Home Living                      Prior Function            PT Goals (current goals can  now be found in the care plan section) Acute Rehab PT Goals Patient Stated Goal: Pt not focused on setting goals Potential to Achieve Goals: Fair Progress towards PT goals: Progressing toward goals    Frequency    Min 3X/week      PT Plan Current plan remains appropriate    Co-evaluation             End of Session Equipment Utilized During Treatment: Gait belt Activity Tolerance: Patient limited by fatigue;Patient limited by lethargy Patient left: in chair;with call bell/phone within reach;with chair alarm set;with nursing/sitter in room Nurse Communication: Mobility  status PT Visit Diagnosis: Other abnormalities of gait and mobility (R26.89);Muscle weakness (generalized) (M62.81)     Time: [id number] PT Time Calculation (min) (ACUTE ONLY): 39 min  Charges:  $Therapeutic Activity: 38-52 mins                    G Codes:       Joycelyn RuaAimee Jaelynne Hockley, PTA pager [phone]    Florestine AversAimee J Luceil Herrin 02/22/2017, 2:23 PM

## 2017-02-22 NOTE — Progress Notes (Addendum)
ANTICOAGULATION CONSULT NOTE - Follow Up Consult  Pharmacy Consult for heparin and warfarin Indication: pulmonary embolus  Labs:  Recent Labs  02/20/17 0403 02/20/17 1452 02/20/17 2135 02/21/17 0253 02/22/17 0443  HGB 10.2*  --   --  9.6* 10.2*  HCT 29.7*  --   --  27.7* 29.8*  PLT 109*  --   --  101* 109*  LABPROT 20.3*  --   --  20.1* 18.6*  INR 1.71  --   --  1.69 1.54  HEPARINUNFRC 0.28* 0.19* 0.28* 0.20*  --   CREATININE 1.07  --   --  0.78  --      Assessment: 71yo male with concern for PE. CT showed increased clot burden compared to prior CT but no definite thrombus/embolus identified. Has a history of PE and DVT in 2015 and 2016.  Heparin changed to sq lovenox yesterday. No issues noted, hgb stable. INR is down this morning to 1.5. Gave higher dose of warfarin last night, although INR does not reflect this yet. Will repeat 4mg  dose tonight before escalating dose further. Recommend discharge home on home dose as he was therapeutic on this dose last outpatient appt.   Home coumadin dose: 1mg  Tues/Thur/Sat and 2mg  all other days   Goal of Therapy:  INR 2-3   Plan:  Continue lovenox 1mg /kg q12 hours Repeat Warfarin 4mg  tonight  Would recommend transitioning patient back to home dose at discharge  Sheppard CoilFrank Adrielle Polakowski PharmD., BCPS Clinical Pharmacist Pager [phone] 02/22/2017 11:11 AM  Addendum:  New orders to change anticoagulation to apixaban. Given increased clot burden on recent CT will start dose apixaban as new PE. Discussed dosing with primary team.   Apixaban 10mg  bid x 7 days then 5mg  bid  02/22/2017 12:00 PM

## 2017-02-23 ENCOUNTER — Inpatient Hospital Stay (HOSPITAL_COMMUNITY): Payer: Medicare Other

## 2017-02-23 DIAGNOSIS — K567 Ileus, unspecified: Secondary | ICD-10-CM | POA: Diagnosis not present

## 2017-02-23 LAB — CULTURE, BODY FLUID-BOTTLE: CULTURE: NO GROWTH

## 2017-02-23 LAB — BASIC METABOLIC PANEL
Anion gap: 5 (ref 5–15)
BUN: 32 mg/dL — AB (ref 6–20)
CHLORIDE: 112 mmol/L — AB (ref 101–111)
CO2: 20 mmol/L — ABNORMAL LOW (ref 22–32)
CREATININE: 0.91 mg/dL (ref 0.61–1.24)
Calcium: 8.2 mg/dL — ABNORMAL LOW (ref 8.9–10.3)
GFR calc Af Amer: >60 mL/min (ref >60)
GFR calc non Af Amer: >60 mL/min (ref >60)
GLUCOSE: 134 mg/dL — AB (ref 65–99)
Potassium: 5.3 mmol/L — ABNORMAL HIGH (ref 3.5–5.1)
Sodium: 137 mmol/L (ref 135–145)

## 2017-02-23 LAB — GLUCOSE, CAPILLARY: GLUCOSE-CAPILLARY: 111 mg/dL — AB (ref 65–99)

## 2017-02-23 LAB — CULTURE, BODY FLUID W GRAM STAIN -BOTTLE

## 2017-02-23 LAB — MAGNESIUM: MAGNESIUM: 1.6 mg/dL — AB (ref 1.7–2.4)

## 2017-02-23 MED ORDER — METOPROLOL TARTRATE 12.5 MG HALF TABLET
12.5000 mg | ORAL_TABLET | Freq: Two times a day (BID) | ORAL | Status: DC
  Administered 2017-02-23 – 2017-03-01 (×7): 12.5 mg via ORAL
  Filled 2017-02-23 (×12): qty 1

## 2017-02-23 NOTE — Progress Notes (Signed)
Patient ID: Calahan Pak, male   DOB: 1946/11/11, 71 y.o.   MRN: [id number]  PROGRESS NOTE  Jacquese Hackman  MVH:[id number] DOB: [birth year] DOA: 02/15/2017  PCP: Lora Paula, MD   Subjective:  Continues to have abdominal distention, no nausea or vomiting. Had bowel movement yesterday and bowel movement this morning. Abdominal x-ray this morning showed distention of his small and large bowel loops consistent with ileus. Nothing by mouth. Does not want to go to skilled nursing facility, he wants to go home when he is better  Brief Narrative:  71 y.o. male with medical history significant for atrial fibrillation and DVT/PE on Coumadin, hyperlipidemia, tobacco abuse, right heart failure with pulmonary hypertension, alcoholic cirrhosis with ascites, ASD (s/p of repair), who presented to [facility] with progressive shortness breath and abdominal distention for almost a month and worse in past several days. No chest pain, fevers.  He was found to have subtherapeutic INR 1.31 on admission, negative troponin, BNP 172, negative urinalysis, mild acute renal injury with creatinine 1.22, WBC 19.0. CT angiogram of chest showed interval increase in the clot burden compared to the prior CT with associated moderate luminal narrowing. No definite occlusive thrombus/embolus identified.  Also had significant ascites requiring to ultrasound-guided paracentesis procedures removing 6.5 L of fluid with S AAG of over 1.1, no signs of SBP. He still considerably weak and will require SNF. Social worker is arranging for placement.   Assessment & Plan:  Pulmonary embolus / DVT, bilateral lower limbs (HCC)  CT angiogram of chest showed interval increase in the clot burden, no definite occlusive thrombus/embolus identified.  No evidence of right heart straining,  INR was subtherapeutic on admission, ? Compliance with Coumadin Restarted on Lovenox bridge/Coumadin, I will discontinue and start on oral Eliquis.  Ileus -This is  new since 2/27, continues to have bowel movements, keep nothing by mouth and placed rectal tube for decompression. -Tried to ambulate in the hallway.  Hypotension Chronic  Asymptomatic, continue supplementation with midodrine, PRN albumin and fluid. Will tolerate systolic blood pressures around 85.   Leukocytosis with tachycardia, SIRS vs sepsis, unclear etiology   Paracentesis ruled out SBP, No cough or dysuria. He appears nontoxic stop all antibiotics on 02/21/2017.  AKI - prerenal component due to massive third spacing and low albumin levels, Lasix on hold, on midodrine. Overall prognosis is grim. Palate of care is following for goals of care after discussion with patient he has chosen to become DO NOT RESUSCITATE. He understands that his prognosis is poor.  Ascites due to alcoholic cirrhosis / Acute alcoholic encephalopathy -  Patient did not drink alcohol since January per his report, outpatient GI follow-up. He is severely cachectic and wasted, currently DO NOT RESUSCITATE. Not a candidate for aggressive measures at this time. If stable outpatient GI follow-up. He received diagnostic and therapeutic paracentesis on 02/18/2017 and 02/20/2017 with total 6.5L of fluid removed had SAAG > 1, no signs of infection. Patient also placed on Xifaxan on 02/20/2017 for his underlying cirrhosis and ? compliance with lactulose.   Hypokalemia - supplemented IV & PO.  Atrial Fibrillation with RVR  - CHA2DS2-VASc Score is 3, currently Lovenox with Coumadin bridging, if prognosis remains poor then may stop anticoagulation completely in the future will defer that to PCP, currently off beta blocker due to low blood pressures. On digoxin. I we'll change his anticoagulation to Eliquis and likely discharge in a.m. to nursing home.  Tobacco abuse -  urged to quit, on nicotine patch.  Pulmonary hypertension and R ventricular failure - supportive care only unable to diabetes due to extremely low blood  pressures.   Anemia of chronic disease - stable monitor    Severe protein calorie malnutrition, cachexia and deconditioning. add protein supplement, long-term prognosis appears grim.   Dyslipidemia -  Continue Lipitor  Nausea and vomiting 1 on 02/21/2017. KUB suggestive of mild ileus, switched diet to soft, Dulcolax suppository, monitor electrolytes. Had a bowel movement on the 27th. place on bowel regimen      DVT prophylaxis: on heparin drip  Code Status: DNR  Family Communication: no family at the bedside this am, pt declined my offer to update any family  Disposition Plan: Discharge when ileus resolves  Consultants:   PT  Nutrition   PCCM   IR for paracentesis - cancelled until BP improves   Procedures:   US paracentesis x 2 total 6.5lits fluid removed.  Ct Angio Chest Pe W And/or Wo Contrast - Result Date: 02/15/2017  1. Eccentric clot along the periphery of the left pulmonary artery extending into the lobar and segmental branches of the left lower lobe. There has been interval increase in the clot burden compared to the prior CT with associated moderate luminal narrowing. No definite occlusive thrombus/embolus identified. 2. Advanced coronary vascular calcification. 3. Morphologic changes of cirrhosis with partially visualized moderate upper abdominal ascites, increased since the prior CT.    Major events since admission:    3/22 - more hypotensive, HR up to 160's, WBC still up 19K, transfer to SDU and call PCCM for assistance   Antimicrobials:   Cipro 02/16/2017 --> 02/21/2017   Objective: Vitals:   02/23/17 0501 02/23/17 0734 02/23/17 1057 02/23/17 1101  BP: 99/67 111/67  113/77  Pulse: (!) 114 (!) 117 (!) 114 (!) 115  Resp: 20 15    Temp: 98.1 F (36.7 C) 99.1 F (37.3 C)    TempSrc: Oral Oral    SpO2: 100% 98%    Weight: 61.2 kg (135 lb)     Height:        Intake/Output Summary (Last 24 hours) at 02/23/17 1208 Last data filed at 02/23/17 0900   Gross per 24 hour  Intake              837 ml  Output              250 ml  Net              587 ml   Filed Weights   02/21/17 0300 02/22/17 0422 02/23/17 0501  Weight: 67.6 kg (149 lb) 68 kg (150 lb) 61.2 kg (135 lb)    Examination:  General exam: Appears slightly confused but not in acute distress  Respiratory system: diminished, no wheezing  Cardiovascular system: iRRR, tachycardic, no murmurs  Gastrointestinal system: (+) BS, fluid wave (+), distended abd, non tender  Central nervous system: No focal neurological deficits. Extremities: Symmetric 5 x 5 power.  Data Reviewed: I have personally reviewed following labs and imaging studies  CBC:  Recent Labs Lab 02/18/17 0342 02/19/17 0311 02/20/17 0403 02/21/17 0253 02/22/17 0443  WBC 14.9* 10.1 12.3* 10.8* 14.0*  HGB 9.7* 8.8* 10.2* 9.6* 10.2*  HCT 27.8* 25.7* 29.7* 27.7* 29.8*  MCV 102.6* 102.4* 103.5* 104.1* 102.4*  PLT 127* 114* 109* 101* 109*   Basic Metabolic Panel:  Recent Labs Lab 02/18/17 0342 02/19/17 0311 02/20/17 0403 02/21/17 0253 02/23/17 0534  NA 132* 132* 134* 136 137  K 3.2* 3.8  3.4* 3.2* 5.3*  CL 101 104 105 106 112*  CO2 23 20* 20* 21* 20*  GLUCOSE 131* 115* 113* 133* 134*  BUN 48* 41* 36* 30* 32*  CREATININE 1.78* 1.45* 1.07 0.78 0.91  CALCIUM 7.5* 7.7* 7.7* 7.8* 8.2*  MG  --   --   --   --  1.6*   Liver Function Tests:  Recent Labs Lab 02/18/17 0906 02/19/17 0311 02/20/17 0403 02/21/17 0253  AST 51* 54* 54* 42*  ALT 27 26 26 21   ALKPHOS 78 68 71 69  BILITOT 1.1 1.4* 1.3* 1.3*  PROT 5.5* 5.8* 6.0* 5.7*  ALBUMIN 1.8* 2.4* 2.3* 2.7*   No results for input(s): AMMONIA in the last 168 hours. Coagulation Profile:  Recent Labs Lab 02/18/17 0342 02/19/17 0311 02/20/17 0403 02/21/17 0253 02/22/17 0443  INR 1.72 1.89 1.71 1.69 1.54   CBG:  Recent Labs Lab 02/20/17 0725 02/21/17 0757 02/22/17 0606 02/22/17 0739 02/23/17 0733  GLUCAP 109* 128* 139* 120* 111*   Urine  analysis:    Component Value Date/Time   COLORURINE AMBER (A) 02/15/2017 2233   APPEARANCEUR HAZY (A) 02/15/2017 2233   LABSPEC 1.020 02/15/2017 2233   PHURINE 5.0 02/15/2017 2233   GLUCOSEU NEGATIVE 02/15/2017 2233   HGBUR NEGATIVE 02/15/2017 2233   BILIRUBINUR SMALL (A) 02/15/2017 2233   KETONESUR NEGATIVE 02/15/2017 2233   PROTEINUR 30 (A) 02/15/2017 2233   UROBILINOGEN 1.0 12/26/2010 0531   NITRITE NEGATIVE 02/15/2017 2233   LEUKOCYTESUR NEGATIVE 02/15/2017 2233   Radiology Studies:  Dg Chest 2 View - Result Date: 02/15/2017  Low lung volumes with subsegmental atelectasis in the right mid lung. No acute infiltrate or edema.   Ct Angio Chest Pe W And/or Wo Contrast - Result Date: 02/15/2017  1. Eccentric clot along the periphery of the left pulmonary artery extending into the lobar and segmental branches of the left lower lobe. There has been interval increase in the clot burden compared to the prior CT with associated moderate luminal narrowing. No definite occlusive thrombus/embolus identified. 2. Advanced coronary vascular calcification. 3. Morphologic changes of cirrhosis with partially visualized moderate upper abdominal ascites, increased since the prior CT.    Scheduled Meds: . atorvastatin  20 mg Oral q1800  . folic acid  1 mg Oral Daily  . lactulose  10 g Oral Daily  . levalbuterol  1.25 mg Nebulization Q6H  . midodrine  10 mg Oral TID WC  . multivitamin with   1 tablet Oral Daily  . nicotine  21 mg Transdermal Daily  . pantoprazole  40 mg Oral Q1200  . thiamine  100 mg Oral Daily  . Warfarin   Does not apply q1800   Continuous Infusions:    LOS: 8 days   Time spent: 25 minutes  Greater than 50% of the time spent on counseling and coordinating the care.  Signature  Lian Pounds A M.D on 02/23/2017 at 12:08 PM  Between 7am to 7pm - Pager - [phone] ( page via [facility], text pages only, please mention full 10 digit call back number).  After 7pm go to  [email or web address] - password [email or web address]  [facility]  - Office  [phone]

## 2017-02-23 NOTE — Progress Notes (Signed)
qPhysical Therapy Treatment Patient Details Name: Jeremy Herman MRN: [id number] DOB: January 12, 1946 Today's Date: 02/23/2017    History of Present Illness  Jeremy Herman is a 71 y.o. male with medical history significant of atrial fibrillation and DVT/PE on Coumadin, hyperlipidemia, tobacco abuse, right heart failure with pulmonary hypertension, alcoholic cirrhosis with ascites, ASD (s/p of repair), who presents with worsening shortness breath and worsening abdominal distention.  CT showing PE, paracentesis pending.    PT Comments    Pt remains to refuse SNF placement and reports he will have help from his "neighbors".  Pt is at a high risk to fall if returning home.  Will inform supervising PT of slow progress and need for a WC [facility].  Plan next session to advance to gait training.     Follow Up Recommendations  SNF (Pt likely refusing SNF and will need to max out HH PT, OT and aide if he refuses SNF placement.  )     Equipment Recommendations  Other (comment) (may possibly need a RW if he is to d/c home due to refusing SNF placement.  )    Recommendations for Other Services       Precautions / Restrictions Precautions Precautions: Fall Restrictions Weight Bearing Restrictions: No    Mobility  Bed Mobility Overal bed mobility: Needs Assistance Bed Mobility: Supine to Sit     Supine to sit: Min assist     General bed mobility comments: Pt able to perform mobility with decreased amount of time but remains to require increased time compared to normal function to move EOB.    Transfers Overall transfer level: Needs assistance Equipment used: Rolling walker (2 wheeled) Transfers: Sit to/from Stand Sit to Stand: Min assist         General transfer comment: Cues for hand placement to and from seated surface.  Pt present with poor eccentric loading during stand to sit due to decreased strength in B quad and triceps.     Ambulation/Gait Ambulation/Gait assistance: Min assist Ambulation Distance (Feet):  (steps from bed to chair, pt refused further gait training.  After mentioning advancing gait O2 sats decreased to 85% then quickly returned to 93% on RA.  ) Assistive device: Rolling walker (2 wheeled) Gait Pattern/deviations: Trunk flexed;Decreased stride length;Shuffle   Gait velocity interpretation: Below normal speed for age/gender General Gait Details: slow unsteady shuffling steps from bed to chair, assist to turn RW and steps by step instruction to perform mobility.    Stairs            Wheelchair Mobility    Modified Rankin (Stroke Patients Only)       Balance Overall balance assessment: Needs assistance   Sitting balance-Leahy Scale: Fair       Standing balance-Leahy Scale: Poor                              Cognition Arousal/Alertness: Awake/alert;Lethargic Behavior During Therapy: Flat affect Overall Cognitive Status: Impaired/Different from baseline Area of Impairment: Attention;Following commands;Awareness;Problem solving                   Current Attention Level: Focused;Sustained   Following Commands: Follows one step commands with increased time   Awareness: Intellectual Problem Solving: Slow processing;Decreased initiation        Exercises      General Comments        Pertinent Vitals/Pain Pain  Assessment: Faces Faces Pain Scale: Hurts little more Pain Location: generalized Pain Descriptors / Indicators: Grimacing;Guarding;Sore Pain Intervention(s): Monitored during session;Repositioned    Home Living                      Prior Function            PT Goals (current goals can now be found in the care plan section) Acute Rehab PT Goals Patient Stated Goal: [place] to return home Potential to Achieve Goals: Fair Progress towards PT goals: Progressing toward goals    Frequency    Min 3X/week      PT Plan  Current plan remains appropriate    Co-evaluation             End of Session Equipment Utilized During Treatment: Gait belt Activity Tolerance: Patient limited by fatigue;Patient limited by lethargy Patient left: in chair;with call bell/phone within reach;with chair alarm set;with nursing/sitter in room Nurse Communication: Mobility status PT Visit Diagnosis: Other abnormalities of gait and mobility (R26.89);Muscle weakness (generalized) (M62.81)     Time: 9147-8295 PT Time Calculation (min) (ACUTE ONLY): 16 min  Charges:  $Therapeutic Activity: 8-22 mins                    G Codes:       Joycelyn Rua, PTA pager [phone]    Florestine Avers 02/23/2017, 7:32 PM

## 2017-02-23 NOTE — Care Management Important Message (Signed)
Important Message  Patient Details  Name: Magda PaganiniJames Rafalski MRN: [id number] Date of Birth: 1945-12-20   Medicare Important Message Given:  Yes    Kyla BalzarineShealy, Daphnie Venturini Abena 02/23/2017, 3:28 PM

## 2017-02-24 ENCOUNTER — Inpatient Hospital Stay (HOSPITAL_COMMUNITY): Payer: Medicare Other

## 2017-02-24 LAB — GLUCOSE, CAPILLARY
GLUCOSE-CAPILLARY: 109 mg/dL — AB (ref 65–99)
GLUCOSE-CAPILLARY: 107 mg/dL — AB (ref 65–99)
Glucose-Capillary: 101 mg/dL — ABNORMAL HIGH (ref 65–99)
Glucose-Capillary: 116 mg/dL — ABNORMAL HIGH (ref 65–99)

## 2017-02-24 LAB — BASIC METABOLIC PANEL
ANION GAP: 8 (ref 5–15)
BUN: 30 mg/dL — ABNORMAL HIGH (ref 6–20)
CALCIUM: 8.4 mg/dL — AB (ref 8.9–10.3)
CHLORIDE: 111 mmol/L (ref 101–111)
CO2: 17 mmol/L — ABNORMAL LOW (ref 22–32)
Creatinine, Ser: 0.9 mg/dL (ref 0.61–1.24)
GFR calc Af Amer: >60 mL/min (ref >60)
GFR calc non Af Amer: >60 mL/min (ref >60)
Glucose, Bld: 102 mg/dL — ABNORMAL HIGH (ref 65–99)
Potassium: 5 mmol/L (ref 3.5–5.1)
SODIUM: 136 mmol/L (ref 135–145)

## 2017-02-24 LAB — CBC
HCT: 29.6 % — ABNORMAL LOW (ref 39.0–52.0)
HEMOGLOBIN: 10 g/dL — AB (ref 13.0–17.0)
MCH: 34.8 pg — AB (ref 26.0–34.0)
MCHC: 33.8 g/dL (ref 30.0–36.0)
MCV: 103.1 fL — AB (ref 78.0–100.0)
Platelets: 127 10*3/uL — ABNORMAL LOW (ref 150–400)
RBC: 2.87 MIL/uL — AB (ref 4.22–5.81)
RDW: 18.2 % — ABNORMAL HIGH (ref 11.5–15.5)
WBC: 13 10*3/uL — AB (ref 4.0–10.5)

## 2017-02-24 MED ORDER — CIPROFLOXACIN IN D5W 400 MG/200ML IV SOLN
400.0000 mg | Freq: Two times a day (BID) | INTRAVENOUS | Status: DC
  Administered 2017-02-24 – 2017-02-26 (×4): 400 mg via INTRAVENOUS
  Filled 2017-02-24 (×5): qty 200

## 2017-02-24 MED ORDER — METOCLOPRAMIDE HCL 5 MG/ML IJ SOLN
10.0000 mg | Freq: Three times a day (TID) | INTRAMUSCULAR | Status: DC
  Administered 2017-02-24 – 2017-02-26 (×6): 10 mg via INTRAVENOUS
  Filled 2017-02-24 (×6): qty 2

## 2017-02-24 NOTE — Consult Note (Signed)
[name] CONSULT Reason for consult: Ileus Referring Physician: [facility].  Jeremy Herman is an 71 y.o. male. PCP: Dr. Adrian Blackwater. Primary G.I.: unassigned HPI: 71 year old with a medical history of cirrhosis. This is due to alcohol. He's had a long history of heart disease and seemed cardiology in the past has had previous pulmonary emboli and DVDs that have been treated on multiple occasions. He has required hospitalization. Throughout all this he's continued to drink. He has been on anticoagulation. He was admitted last year for femur fracture most recent admission was a month ago with pulmonary embolus is felt to be chronic, he has a diagnosis of ASD that was apparently treated percutaneously, pulmonary hypertension, cirrhosis with ascites chronic malnutrition. Came in with complaints shortness of breath and coughing with abdominal distention. At ascites on imaging studies in has undergone to paracentesis. Most recent paracentesis only showed 78 white cells in the ascitic fluid. It was originally on Coumadin but because of his liver disease has been started on Eliquis 15 mg BID. Repeat CT angiograms have shown an increase in the burden of clot in his pulmonary arteries with luminal narrowing. As well as increased ascites. 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 He has been seen by palliative care is not really clear what the plan is but he has been made a DNR. The patient has developed an alias. This is been within the past couple days. Review the film shows this appears to be mostly small bowel gas. He states to me that it is doing a little bit better and he is having a few flatulent episodes. He denies abdominal pain. Despite his chronic DVT and pulmonary emboli in the fact that he was supposed to be on Coumadin he had a sub therapeutic INR on admission and admits he may not have been taking his Coumadin the way he was supposed  to.  Past Medical History:  Diagnosis Date  . Arthritis    "right knee" (01/17/2017)  . Chronic anticoagulation   . DVT (deep venous thrombosis) ([place]) 2015; 2016   RLE; LLE  . Paroxysmal atrial fibrillation ([place])    Archie Endo 01/17/2017  . PE (pulmonary embolism) 2015; 2016  . Pneumonia 1969    Past Surgical History:  Procedure Laterality Date  . ANKLE FRACTURE SURGERY Right 1979   "skiing accident"  . ASD REPAIR  01/17/2017  . ATRIAL SEPTAL DEFECT(ASD) CLOSURE N/A 01/17/2017   Procedure: Atrial Septal Defect(ASD) Closure;  Surgeon: Sherren Mocha, MD;  Location: [facility];  Service: Cardiovascular;  Laterality: N/A;  . CARDIAC CATHETERIZATION  01/13/2017  . FEMUR IM NAIL Left 04/21/2016   Procedure: INTRAMEDULLARY (IM) NAIL LEFT  FEMORAL;  Surgeon: Rod Can, MD;  Location: [facility];  Service: Orthopedics;  Laterality: Left;  . FRACTURE SURGERY    . IR GENERIC HISTORICAL  02/20/2017   IR PARACENTESIS 02/20/2017 Ardis Rowan, PA-C [facility]  . RIGHT/LEFT HEART CATH AND CORONARY ANGIOGRAPHY N/A 01/13/2017   Procedure: Right/Left Heart Cath and Coronary Angiography;  Surgeon: Larey Dresser, MD;  Location: [place] [facility];  Service: Cardiovascular;  Laterality: N/A;  . TEE WITHOUT CARDIOVERSION N/A 01/13/2017   Procedure: TRANSESOPHAGEAL ECHOCARDIOGRAM (TEE);  Surgeon: Larey Dresser, MD;  Location: [facility];  Service: Cardiovascular;  Laterality: N/A;    Family History  Problem Relation Age of Onset  . Stomach cancer Father   . Cancer Mother   .  Aneurysm Mother   . Deep vein thrombosis Brother 60    Social History:  reports that he has been smoking Cigarettes.  He has a 48.00 pack-year smoking history. He has never used smokeless tobacco. He reports that he does not drink alcohol or use drugs.  Allergies:  Allergies  Allergen Reactions  . Fish Allergy Anaphylaxis and Other (See Comments)    Diarrhea   . Ambien [Zolpidem Tartrate]      Medications; Prior to Admission medications   Medication Sig Start Date End Date Taking? Authorizing Provider  atorvastatin (LIPITOR) 20 MG tablet Take 1 tablet (20 mg total) by mouth daily at 6 PM. 01/18/17  Yes Lorella Nimrod, MD  furosemide (LASIX) 40 MG tablet Take 1 tablet (40 mg total) by mouth daily. 01/19/17  Yes Lorella Nimrod, MD  midodrine (PROAMATINE) 10 MG tablet Take 1 tablet (10 mg total) by mouth 3 (three) times daily with meals. Patient taking differently: Take 10 mg by mouth daily. TAKES WITH MEALS BUT USUALLY ONLY EATS ONCE DAILY 01/18/17  Yes Lorella Nimrod, MD  senna-docusate (SENOKOT-S) 8.6-50 MG tablet Take 1 tablet by mouth at bedtime as needed for mild constipation. 01/18/17  Yes Lorella Nimrod, MD  spironolactone (ALDACTONE) 100 MG tablet Take 2 tablets (200 mg total) by mouth daily. 01/19/17  Yes Lorella Nimrod, MD  warfarin (COUMADIN) 2 MG tablet Take 1 tablet by mouth every day but 2 tablets on Tuesdays and Saturdays. Patient taking differently: Take 1-2 mg by mouth See admin instructions. 70m On Tuesday, Thursday, Saturday - pt takes 263mon Sunday, Monday, Wednesday, Friday 12/09/16  Yes Josalyn Funches, MD  feeding supplement, ENSURE ENLIVE, (ENSURE ENLIVE) LIQD Take 237 mLs by mouth 2 (two) times daily between meals. Patient not taking: Reported on 02/15/2017 01/18/17   SuLorella NimrodMD  folic acid (FOLVITE) 1 MG tablet Take 1 tablet (1 mg total) by mouth daily. Patient not taking: Reported on 01/10/2017 12/13/16   JoBoykin NearingMD  Multiple Vitamins-Minerals (MULTIVITAMIN) tablet Take 1 tablet by mouth daily. Patient not taking: Reported on 01/10/2017 12/13/16   JoBoykin NearingMD  thiamine (VITAMIN B-1) 100 MG tablet Take 1 tablet (100 mg total) by mouth daily. Patient not taking: Reported on 01/10/2017 12/13/16   JoBoykin NearingMD  traMADol (ULTRAM) 50 MG tablet Take 1 tablet (50 mg total) by mouth every 12 (twelve) hours as needed. Patient not taking: Reported on 02/15/2017  12/09/16   JoBoykin NearingMD   . apixaban  10 mg Oral BID   Followed by  . [START ON 03/01/2017] apixaban  5 mg Oral BID  . atorvastatin  20 mg Oral q1800  . chlorhexidine  15 mL Mouth Rinse BID  . digoxin  0.125 mg Oral Daily  . feeding supplement  1 Container Oral BID BM  . feeding supplement (PRO-STAT SUGAR FREE 64)  30 mL Oral BID  . folic acid  1 mg Oral Daily  . lactulose  10 g Oral TID  . mouth rinse  15 mL Mouth Rinse q12n4p  . metoprolol tartrate  12.5 mg Oral BID  . midodrine  10 mg Oral TID WC  . multivitamin with minerals  1 tablet Oral Daily  . nicotine  21 mg Transdermal Daily  . pantoprazole  40 mg Oral Q1200  . rifaximin  550 mg Oral BID  . senna-docusate  2 tablet Oral BID  . sodium chloride flush  3 mL Intravenous Q12H  . thiamine  100 mg Oral Daily  PRN Meds alum & mag hydroxide-simeth, levalbuterol, ondansetron, simethicone Results for orders placed or performed during the hospital encounter of 02/15/17 (from the past 48 hour(s))  Magnesium     Status: Abnormal   Collection Time: 02/23/17  5:34 AM  Result Value Ref Range   Magnesium 1.6 (L) 1.7 - 2.4 mg/dL  Basic metabolic panel     Status: Abnormal   Collection Time: 02/23/17  5:34 AM  Result Value Ref Range   Sodium 137 135 - 145 mmol/L   Potassium 5.3 (H) 3.5 - 5.1 mmol/L   Chloride 112 (H) 101 - 111 mmol/L   CO2 20 (L) 22 - 32 mmol/L   Glucose, Bld 134 (H) 65 - 99 mg/dL   BUN 32 (H) 6 - 20 mg/dL   Creatinine, Ser 0.91 0.61 - 1.24 mg/dL   Calcium 8.2 (L) 8.9 - 10.3 mg/dL   GFR calc non Af Amer >60 >60 mL/min   GFR calc Af Amer >60 >60 mL/min    Comment: (NOTE) The eGFR has been calculated using the CKD EPI equation. This calculation has not been validated in all clinical situations. eGFR's persistently <60 mL/min signify possible Chronic Kidney Disease.    Anion gap 5 5 - 15  Glucose, capillary     Status: Abnormal   Collection Time: 02/23/17  7:33 AM  Result Value Ref Range    Glucose-Capillary 111 (H) 65 - 99 mg/dL  Basic metabolic panel     Status: Abnormal   Collection Time: 02/24/17  4:26 AM  Result Value Ref Range   Sodium 136 135 - 145 mmol/L   Potassium 5.0 3.5 - 5.1 mmol/L   Chloride 111 101 - 111 mmol/L   CO2 17 (L) 22 - 32 mmol/L   Glucose, Bld 102 (H) 65 - 99 mg/dL   BUN 30 (H) 6 - 20 mg/dL   Creatinine, Ser 0.90 0.61 - 1.24 mg/dL   Calcium 8.4 (L) 8.9 - 10.3 mg/dL   GFR calc non Af Amer >60 >60 mL/min   GFR calc Af Amer >60 >60 mL/min    Comment: (NOTE) The eGFR has been calculated using the CKD EPI equation. This calculation has not been validated in all clinical situations. eGFR's persistently <60 mL/min signify possible Chronic Kidney Disease.    Anion gap 8 5 - 15  CBC     Status: Abnormal   Collection Time: 02/24/17  4:26 AM  Result Value Ref Range   WBC 13.0 (H) 4.0 - 10.5 K/uL   RBC 2.87 (L) 4.22 - 5.81 MIL/uL   Hemoglobin 10.0 (L) 13.0 - 17.0 g/dL   HCT 29.6 (L) 39.0 - 52.0 %   MCV 103.1 (H) 78.0 - 100.0 fL   MCH 34.8 (H) 26.0 - 34.0 pg   MCHC 33.8 30.0 - 36.0 g/dL   RDW 18.2 (H) 11.5 - 15.5 %   Platelets 127 (L) 150 - 400 K/uL  Glucose, capillary     Status: Abnormal   Collection Time: 02/24/17  7:52 AM  Result Value Ref Range   Glucose-Capillary 101 (H) 65 - 99 mg/dL  Glucose, capillary     Status: Abnormal   Collection Time: 02/24/17 11:05 AM  Result Value Ref Range   Glucose-Capillary 116 (H) 65 - 99 mg/dL    Dg Abd 1 View  Result Date: 02/24/2017 CLINICAL DATA:  Ileus. EXAM: ABDOMEN - 1 VIEW COMPARISON:  02/23/2017 FINDINGS: Diffuse gaseous distention of bowel wall again noted, not significantly changed since prior study.  No free air organomegaly. IMPRESSION: Stable diffuse gaseous distention of bowel, likely ileus. Electronically Signed   By: Rolm Baptise M.D.   On: 02/24/2017 08:10   Dg Abd 1 View  Result Date: 02/23/2017 CLINICAL DATA:  Abdominal distension EXAM: ABDOMEN - 1 VIEW COMPARISON:  KUB of February 21, 2017 FINDINGS: Persistent distention of small and large bowel loops with gas. There is some gas in the rectum. No free extraluminal gas collections are observed. There is degenerative disc disease of the lumbar spine. IMPRESSION: Stable distention of small and large bowel loops consistent with ileus. Electronically Signed   By: David  Martinique M.D.   On: 02/23/2017 07:44               Blood pressure 104/72, pulse (!) 106, temperature 98.2 F (36.8 C), temperature source Oral, resp. rate (!) 25, height 6' (1.829 m), weight 57.2 kg (126 lb), SpO2 100 %.  Physical exam:   General-- very frail white male seems somewhat confused, denies pain ENT-- slightly icteric  Heart-- regular rate and rhythm Lungs-- clear Abdomen-- distended but soft with few bowel sounds Psych-- alert but slightly confused possibly encephalopathic slight flap   Assessment: 1. Ileus. The cause of this is unclear. His electrolytes look okay. His ascitic fluid did not have a particularly high white count when it was tested several days ago. This may be multifactorial. Must question whether or not he has developed some intra-abdominal infection despite the low WBC count several days ago. 2. Alcoholic cirrhosis with the societies. 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 Chronic DVT/pulmonary emboli 4. PAF with rapid ventricular response 5. Cigarette abuse  Plan: 1. Agree with rectal tube. Will add IV metoclopramide to see if this will help. Would keep NPO now except medications with sips water. I would favor empiric therapy with antibiotics for a few days to see if this will help. 2. Continue anticoagulants. Prognosis is very poor.  Basheer Molchan JR,Rosbel L 02/24/2017, 2:09 PM   This note was created using voice recognition software and minor errors may Have occurred unintentionally. Pager: [phone] If no answer or after hours call [phone]

## 2017-02-24 NOTE — Progress Notes (Signed)
Patient ID: Jeremy Herman, male   DOB: [birth year], 71 y.o.   MRN: [id number]  PROGRESS NOTE  Jeremy Herman  WUJ:[id number] DOB: June 03, 1946 DOA: 02/15/2017  PCP: Lora Paula, MD   Subjective: Continues to have abdominal distention, being NPO and has rectal tube since yesterday. Denies abdominal pain.  Brief Narrative:  71 y.o. male with medical history significant for atrial fibrillation and DVT/PE on Coumadin, hyperlipidemia, tobacco abuse, right heart failure with pulmonary hypertension, alcoholic cirrhosis with ascites, ASD (s/p of repair), who presented to [facility] with progressive shortness breath and abdominal distention for almost a month and worse in past several days. No chest pain, fevers.  He was found to have subtherapeutic INR 1.31 on admission, negative troponin, BNP 172, negative urinalysis, mild acute renal injury with creatinine 1.22, WBC 19.0. CT angiogram of chest showed interval increase in the clot burden compared to the prior CT with associated moderate luminal narrowing. No definite occlusive thrombus/embolus identified.  Also had significant ascites requiring to ultrasound-guided paracentesis procedures removing 6.5 L of fluid with S AAG of over 1.1, no signs of SBP. He still considerably weak and will require SNF. Social worker is arranging for placement.  Assessment & Plan:   Pulmonary embolus / DVT, bilateral lower limbs (HCC)  CT angiogram of chest showed interval increase in the clot burden, no definite occlusive thrombus/embolus identified.  No evidence of right heart straining,  INR was subtherapeutic on admission, ? Compliance with Coumadin Restarted on Lovenox bridge/Coumadin, I will discontinue and start on oral Eliquis.  Ileus -This is new since 2/27, continues to have bowel movements, keep NPO, rectal tube for decompression. -Not able to ambulate in the hallway very well. -Initially called gen. surgery, they recommended to call  gastroenterology.  Hypotension Chronic  Asymptomatic, continue supplementation with midodrine, PRN albumin and fluid. Will tolerate systolic blood pressures around 85.   Leukocytosis with tachycardia, SIRS vs sepsis, unclear etiology   Paracentesis ruled out SBP, No cough or dysuria. He appears nontoxic stop all antibiotics on 02/21/2017.  AKI - prerenal component due to massive third spacing and low albumin levels, Lasix on hold, on midodrine. Overall prognosis is grim. Palate of care is following for goals of care after discussion with patient he has chosen to become DO NOT RESUSCITATE. He understands that his prognosis is poor.  Ascites due to alcoholic cirrhosis / Acute alcoholic encephalopathy -  Patient did not drink alcohol since January per his report, outpatient GI follow-up. He is severely cachectic and wasted, currently DO NOT RESUSCITATE. Not a candidate for aggressive measures at this time. If stable outpatient GI follow-up. He received diagnostic and therapeutic paracentesis on 02/18/2017 and 02/20/2017 with total 6.5L of fluid removed had SAAG > 1, no signs of infection. Patient also placed on Xifaxan on 02/20/2017 for his underlying cirrhosis and ? compliance with lactulose.   Hypokalemia - supplemented IV & PO.  Atrial Fibrillation with RVR  - CHA2DS2-VASc Score is 3, was on Lovenox with Coumadin bridging, if prognosis remains poor then may stop anticoagulation completely in the future will defer that to PCP, currently off beta blocker due to low blood pressures. On digoxin. Changed his anticoagulation to Eliquis.  Tobacco abuse -  urged to quit, on nicotine patch.   Pulmonary hypertension and R ventricular failure - supportive care only unable to diabetes due to extremely low blood pressures.   Anemia of chronic disease - stable monitor    Severe protein calorie malnutrition, cachexia and deconditioning. add protein supplement,  long-term prognosis appears grim.    Dyslipidemia -  Continue Lipitor  Nausea and vomiting 1 on 02/21/2017. KUB suggestive of mild ileus, switched diet to soft, Dulcolax suppository, monitor electrolytes. Had a bowel movement on the 27th. place on bowel regimen     DVT prophylaxis: on Eliquis Code Status: DNR  Family Communication: no family at the bedside. Disposition Plan: Discharge when ileus resolves  Consultants:   PT  Nutrition   PCCM   IR for paracentesis - cancelled until BP improves   Procedures:   US paracentesis x 2 total 6.5lits fluid removed.  Ct Angio Chest Pe W And/or Wo Contrast - Result Date: 02/15/2017  1. Eccentric clot along the periphery of the left pulmonary artery extending into the lobar and segmental branches of the left lower lobe. There has been interval increase in the clot burden compared to the prior CT with associated moderate luminal narrowing. No definite occlusive thrombus/embolus identified. 2. Advanced coronary vascular calcification. 3. Morphologic changes of cirrhosis with partially visualized moderate upper abdominal ascites, increased since the prior CT.    Major events since admission:    3/22 - more hypotensive, HR up to 160's, WBC still up 19K, transfer to SDU and call PCCM for assistance   Antimicrobials:   Cipro 02/16/2017 --> 02/21/2017   Objective: Vitals:   02/23/17 2357 02/24/17 0300 02/24/17 0756 02/24/17 1027  BP: (!) 90/59 104/61 (!) 97/59   Pulse: (!) 113 (!) 110 (!) 113 (!) 112  Resp: 20 (!) 25    Temp: 98.2 F (36.8 C) 98.1 F (36.7 C) 98.1 F (36.7 C)   TempSrc: Oral Oral Oral   SpO2: 98% 99% 98%   Weight:  57.2 kg (126 lb)    Height:        Intake/Output Summary (Last 24 hours) at 02/24/17 1049 Last data filed at 02/24/17 0600  Gross per 24 hour  Intake               10 ml  Output              150 ml  Net             -140 ml   Filed Weights   02/22/17 0422 02/23/17 0501 02/24/17 0300  Weight: 68 kg (150 lb) 61.2 kg (135 lb) 57.2 kg  (126 lb)    Examination:  General exam: Appears slightly confused but not in acute distress  Respiratory system: diminished, no wheezing  Cardiovascular system: iRRR, tachycardic, no murmurs  Gastrointestinal system: (+) BS, fluid wave (+), distended abd, non tender, Rectal tube has some output.  Central nervous system: No focal neurological deficits. Extremities: Symmetric 5 x 5 power.  Data Reviewed: I have personally reviewed following labs and imaging studies  CBC:  Recent Labs Lab 02/19/17 0311 02/20/17 0403 02/21/17 0253 02/22/17 0443 02/24/17 0426  WBC 10.1 12.3* 10.8* 14.0* 13.0*  HGB 8.8* 10.2* 9.6* 10.2* 10.0*  HCT 25.7* 29.7* 27.7* 29.8* 29.6*  MCV 102.4* 103.5* 104.1* 102.4* 103.1*  PLT 114* 109* 101* 109* 127*   Basic Metabolic Panel:  Recent Labs Lab 02/19/17 0311 02/20/17 0403 02/21/17 0253 02/23/17 0534 02/24/17 0426  NA 132* 134* 136 137 136  K 3.8 3.4* 3.2* 5.3* 5.0  CL 104 105 106 112* 111  CO2 20* 20* 21* 20* 17*  GLUCOSE 115* 113* 133* 134* 102*  BUN 41* 36* 30* 32* 30*  CREATININE 1.45* 1.07 0.78 0.91 0.90  CALCIUM 7.7* 7.7* 7.8* 8.2*  8.4*  MG  --   --   --  1.6*  --    Liver Function Tests:  Recent Labs Lab 02/18/17 0906 02/19/17 0311 02/20/17 0403 02/21/17 0253  AST 51* 54* 54* 42*  ALT ALKPHOS 78 68 71 69  BILITOT 1.1 1.4* 1.3* 1.3*  PROT 5.5* 5.8* 6.0* 5.7*  ALBUMIN 1.8* 2.4* 2.3* 2.7*   No results for input(s): AMMONIA in the last 168 hours. Coagulation Profile:  Recent Labs Lab 02/18/17 0342 02/19/17 0311 02/20/17 0403 02/21/17 0253 02/22/17 0443  INR 1.72 1.89 1.71 1.69 1.54   CBG:  Recent Labs Lab 02/21/17 0757 02/22/17 0606 02/22/17 0739 02/23/17 0733 02/24/17 0752  GLUCAP 128* 139* 120* 111* 101*   Urine analysis:    Component Value Date/Time   COLORURINE AMBER (A) 02/15/2017 2233   APPEARANCEUR HAZY (A) 02/15/2017 2233   LABSPEC 1.020 02/15/2017 2233   PHURINE 5.0 02/15/2017 2233    GLUCOSEU NEGATIVE 02/15/2017 2233   HGBUR NEGATIVE 02/15/2017 2233   BILIRUBINUR SMALL (A) 02/15/2017 2233   KETONESUR NEGATIVE 02/15/2017 2233   PROTEINUR 30 (A) 02/15/2017 2233   UROBILINOGEN 1.0 12/26/2010 0531   NITRITE NEGATIVE 02/15/2017 2233   LEUKOCYTESUR NEGATIVE 02/15/2017 2233   Radiology Studies:  Dg Chest 2 View - Result Date: 02/15/2017  Low lung volumes with subsegmental atelectasis in the right mid lung. No acute infiltrate or edema.   Ct Angio Chest Pe W And/or Wo Contrast - Result Date: 02/15/2017  1. Eccentric clot along the periphery of the left pulmonary artery extending into the lobar and segmental branches of the left lower lobe. There has been interval increase in the clot burden compared to the prior CT with associated moderate luminal narrowing. No definite occlusive thrombus/embolus identified. 2. Advanced coronary vascular calcification. 3. Morphologic changes of cirrhosis with partially visualized moderate upper abdominal ascites, increased since the prior CT.    Scheduled Meds: . atorvastatin  20 mg Oral q1800  . folic acid  1 mg Oral Daily  . lactulose  10 g Oral Daily  . levalbuterol  1.25 mg Nebulization Q6H  . midodrine  10 mg Oral TID WC  . multivitamin with   1 tablet Oral Daily  . nicotine  21 mg Transdermal Daily  . pantoprazole  40 mg Oral Q1200  . thiamine  100 mg Oral Daily  . Warfarin   Does not apply q1800   Continuous Infusions:    LOS: 9 days   Time spent: 25 minutes  Greater than 50% of the time spent on counseling and coordinating the care.  Signature  Ayson Cherubini A M.D on 02/24/2017 at 10:49 AM  Between 7am to 7pm - Pager - [phone] ( page via [facility], text pages only, please mention full 10 digit call back number).  After 7pm go to [email or web address] - password [facility]  [facility]  - Office  [phone]

## 2017-02-24 NOTE — Progress Notes (Signed)
qPhysical Therapy Treatment Patient Details Name: Jeremy Herman MRN: [id number] DOB: 1946/04/13 Today's Date: 02/24/2017    History of Present Illness Jeremy Herman is a 71 y.o. male with PMH significant of atrial fibrillation and DVT/PE on Coumadin, hyperlipidemia, tobacco abuse, right heart failure with pulmonary hypertension, alcoholic cirrhosis with ascites, ASD (s/p of repair), who presents with worsening SOB and worsening abdominal distention.  CT showing PE, paracentesis pending.    PT Comments    Pt mobility continues to be limited by significant fatigue, muscle weakness, dyspnea with exertion, and dizziness with position changes (see orthostatics below). Today, pt able to stand 1x with RW and maxA, unable to stand on second attempt for pericare. MinA for bed mobility with significantly increased time for pericare. Max encouragement and education on importance of mobility and continued rehab at SNF upon d/c, but pt continues to adamantly refuse SNF placement. Will need maximized [facility] services if he returns home, in addition to a w/c. Will continue to follow acutely.   Supine BP 112/70 Sitting BP 87/76 Standing BP 102/70s   Follow Up Recommendations  SNF (Pt refusing SNF; will need to max out HHPT/OT/aide if he continues to refuse)     Equipment Recommendations  Rolling walker with 5" wheels;Wheelchair (measurements PT)    Recommendations for Other Services       Precautions / Restrictions Precautions Precautions: Fall Restrictions Weight Bearing Restrictions: No    Mobility  Bed Mobility Overal bed mobility: Needs Assistance Bed Mobility: Rolling;Sit to Supine;Sidelying to Sit Rolling: Min assist Sidelying to sit: Min assist;HOB elevated   Sit to supine: Min assist;HOB elevated   General bed mobility comments: Significant increased time for bed mobility. Rolled 3x R/L for pericare. Required HOB to be elevated for sidelying<>sit, unwilling to try with [employer] [facility] flat (even  though educ that this will be the case at home). Dyspnea 2/4 with bed mob.   Transfers Overall transfer level: Needs assistance Equipment used: Rolling walker (2 wheeled) Transfers: Sit to/from Stand Sit to Stand: Max assist         General transfer comment: Significant increased time for sit-to-stand with RW, eventually needing maxA to stand. Verbalized fear of falling as reason it was taking him so long to attempt standing, despite max encouragement. Unable to stand on 2nd attempt for pericare.   Ambulation/Gait                 Stairs            Wheelchair Mobility    Modified Rankin (Stroke Patients Only)       Balance Overall balance assessment: Needs assistance Sitting-balance support: Feet supported Sitting balance-Leahy Scale: Fair     Standing balance support: Bilateral upper extremity supported Standing balance-Leahy Scale: Poor Standing balance comment: Reliant on RW for support; min guard to minA for standing balance                            Cognition Arousal/Alertness: Awake/alert;Lethargic Behavior During Therapy: Flat affect Overall Cognitive Status: Impaired/Different from baseline Area of Impairment: Attention;Following commands;Awareness;Problem solving                   Current Attention Level: Sustained   Following Commands: Follows one step commands with increased time   Awareness: Emergent Problem Solving: Decreased initiation;Slow processing General Comments: Pt with significantly slowed movement initiation, especially into standing; this may be due to his verbalized fear of moving and falling.  Exercises      General Comments General comments (skin integrity, edema, etc.): Dyspnea 3/4 with mobility. C/o of dizziness with all position changes which subsided with rest; resting BP 112/70, sitting BP 87/76, standing BP 102/70s.       Pertinent Vitals/Pain Pain Assessment: Faces Faces Pain Scale: Hurts  little more Pain Location: Abdomen Pain Descriptors / Indicators: Grimacing;Guarding;Sore Pain Intervention(s): Monitored during session;Repositioned    Home Living                      Prior Function            PT Goals (current goals can now be found in the care plan section) Progress towards PT goals: Progressing toward goals    Frequency    Min 3X/week      PT Plan Current plan remains appropriate    Co-evaluation             End of Session Equipment Utilized During Treatment: Gait belt Activity Tolerance: Patient limited by fatigue;Patient limited by lethargy Patient left: in bed;with call bell/phone within reach Nurse Communication: Mobility status PT Visit Diagnosis: Other abnormalities of gait and mobility (R26.89);Muscle weakness (generalized) (M62.81)     Time: 4782-9562 PT Time Calculation (min) (ACUTE ONLY): 55 min  Charges:  $Therapeutic Activity: 53-67 mins                    G Codes:      Dewayne Hatch, SPT Office-[phone]  Ina Homes 02/24/2017, 12:31 PM

## 2017-02-25 LAB — GLUCOSE, CAPILLARY
GLUCOSE-CAPILLARY: 122 mg/dL — AB (ref 65–99)
GLUCOSE-CAPILLARY: 144 mg/dL — AB (ref 65–99)
Glucose-Capillary: 110 mg/dL — ABNORMAL HIGH (ref 65–99)
Glucose-Capillary: 152 mg/dL — ABNORMAL HIGH (ref 65–99)
Glucose-Capillary: 146 mg/dL — ABNORMAL HIGH (ref 65–99)

## 2017-02-25 MED ORDER — POLYETHYLENE GLYCOL 3350 17 G PO PACK
17.0000 g | PACK | Freq: Two times a day (BID) | ORAL | Status: DC
  Administered 2017-02-25 – 2017-02-28 (×8): 17 g via ORAL
  Filled 2017-02-25 (×9): qty 1

## 2017-02-25 NOTE — Progress Notes (Signed)
Patient ID: Jeremy Herman, male   DOB: [birth year], 71 y.o.   MRN: [id number]  PROGRESS NOTE  Jeremy Herman  WUJ:[id number] DOB: Sep 13, 1946 DOA: 02/15/2017  PCP: Lora Paula, MD   Subjective: Abdominal distention less than yesterday, patient reported less pain. Denies any other complaints, seen by GI, recommended trial of clear liquids.  Brief Narrative:  71 y.o. male with medical history significant for atrial fibrillation and DVT/PE on Coumadin, hyperlipidemia, tobacco abuse, right heart failure with pulmonary hypertension, alcoholic cirrhosis with ascites, ASD (s/p of repair), who presented to [facility] with progressive shortness breath and abdominal distention for almost a month and worse in past several days. No chest pain, fevers.  He was found to have subtherapeutic INR 1.31 on admission, negative troponin, BNP 172, negative urinalysis, mild acute renal injury with creatinine 1.22, WBC 19.0. CT angiogram of chest showed interval increase in the clot burden compared to the prior CT with associated moderate luminal narrowing. No definite occlusive thrombus/embolus identified.  Also had significant ascites requiring to ultrasound-guided paracentesis procedures removing 6.5 L of fluid with S AAG of over 1.1, no signs of SBP. He still considerably weak and will require SNF. Social worker is arranging for placement.  Assessment & Plan:   Pulmonary embolus / DVT, bilateral lower limbs (HCC)  -CT angiogram of chest showed interval increase in the clot burden, no definite occlusive thrombus/embolus identified.  -No evidence of right heart straining,  INR was subtherapeutic on admission, ? Compliance with Coumadin -Restarted on Lovenox bridge/Coumadin, this is discontinued currently on Eliquis.  Ileus -This is new since 2/27, continues to have bowel movements, keep NPO, rectal tube for decompression. -Not able to ambulate in the hallway very well. -Appreciate gastroenterology's help, started on  Reglan, trial of clear liquids today. -On lactulose, MiraLAX and Senokot added.  Hypotension Chronic  Asymptomatic, continue supplementation with midodrine, PRN albumin and fluid. Will tolerate systolic blood pressures around 85.   Leukocytosis with tachycardia, SIRS vs sepsis, unclear etiology   Paracentesis ruled out SBP, No cough or dysuria. He appears nontoxic stop all antibiotics on 02/21/2017.  AKI - prerenal component due to massive third spacing and low albumin levels, Lasix on hold, on midodrine. Overall prognosis is grim. Palate of care is following for goals of care after discussion with patient he has chosen to become DO NOT RESUSCITATE. He understands that his prognosis is poor.  Ascites due to alcoholic cirrhosis / Acute alcoholic encephalopathy -  Patient did not drink alcohol since January per his report, outpatient GI follow-up. He is severely cachectic and wasted, currently DO NOT RESUSCITATE. Not a candidate for aggressive measures at this time. If stable outpatient GI follow-up. He received diagnostic and therapeutic paracentesis on 02/18/2017 and 02/20/2017 with total 6.5L of fluid removed had SAAG > 1, no signs of infection. Patient also placed on Xifaxan on 02/20/2017 for his underlying cirrhosis and ? compliance with lactulose.   Hypokalemia - supplemented IV & PO.  Atrial Fibrillation with RVR  - CHA2DS2-VASc Score is 3, was on Lovenox with Coumadin bridging, if prognosis remains poor then may stop anticoagulation completely in the future will defer that to PCP, currently off beta blocker due to low blood pressures. On digoxin. Changed his anticoagulation to Eliquis.  Tobacco abuse -  urged to quit, on nicotine patch.   Pulmonary hypertension and R ventricular failure - supportive care only unable to diabetes due to extremely low blood pressures.   Anemia of chronic disease - stable monitor  Severe protein calorie malnutrition, cachexia and deconditioning. add  protein supplement, long-term prognosis appears grim.   Dyslipidemia -  Continue Lipitor  Nausea and vomiting 1 on 02/21/2017. KUB suggestive of mild ileus, switched diet to soft, Dulcolax suppository, monitor electrolytes. Had a bowel movement on the 27th. place on bowel regimen     DVT prophylaxis: on Eliquis Code Status: DNR  Family Communication: no family at the bedside. Disposition Plan: Discharge when ileus resolves  Consultants:   PT  Nutrition   PCCM   IR for paracentesis - cancelled until BP improves   Procedures:   US paracentesis x 2 total 6.5lits fluid removed.  Ct Angio Chest Pe W And/or Wo Contrast - Result Date: 02/15/2017  1. Eccentric clot along the periphery of the left pulmonary artery extending into the lobar and segmental branches of the left lower lobe. There has been interval increase in the clot burden compared to the prior CT with associated moderate luminal narrowing. No definite occlusive thrombus/embolus identified. 2. Advanced coronary vascular calcification. 3. Morphologic changes of cirrhosis with partially visualized moderate upper abdominal ascites, increased since the prior CT.    Major events since admission:    3/22 - more hypotensive, HR up to 160's, WBC still up 19K, transfer to SDU and call PCCM for assistance   Antimicrobials:   Cipro 02/16/2017 --> 02/21/2017   Objective: Vitals:   02/25/17 0030 02/25/17 0355 02/25/17 0744 02/25/17 0854  BP: 114/68 101/64 98/61   Pulse: 98 (!) 104    Resp: (!) 26 16    Temp: 98.7 F (37.1 C) 98.4 F (36.9 C)  98.2 F (36.8 C)  TempSrc:    Oral  SpO2: 100% 100% 99%   Weight:  66.7 kg (147 lb)    Height:        Intake/Output Summary (Last 24 hours) at 02/25/17 1120 Last data filed at 02/25/17 0601  Gross per 24 hour  Intake              383 ml  Output             1250 ml  Net             -867 ml   Filed Weights   02/23/17 0501 02/24/17 0300 02/25/17 0355  Weight: 61.2 kg (135 lb)  57.2 kg (126 lb) 66.7 kg (147 lb)    Examination:  General exam: Appears slightly confused but not in acute distress  Respiratory system: diminished, no wheezing  Cardiovascular system: iRRR, tachycardic, no murmurs  Gastrointestinal system: (+) BS, fluid wave (+), distended abd, non tender, Rectal tube has some output.  Central nervous system: No focal neurological deficits. Extremities: Symmetric 5 x 5 power.  Data Reviewed: I have personally reviewed following labs and imaging studies  CBC:  Recent Labs Lab 02/19/17 0311 02/20/17 0403 02/21/17 0253 02/22/17 0443 02/24/17 0426  WBC 10.1 12.3* 10.8* 14.0* 13.0*  HGB 8.8* 10.2* 9.6* 10.2* 10.0*  HCT 25.7* 29.7* 27.7* 29.8* 29.6*  MCV 102.4* 103.5* 104.1* 102.4* 103.1*  PLT 114* 109* 101* 109* 127*   Basic Metabolic Panel:  Recent Labs Lab 02/19/17 0311 02/20/17 0403 02/21/17 0253 02/23/17 0534 02/24/17 0426  NA 132* 134* 136 137 136  K 3.8 3.4* 3.2* 5.3* 5.0  CL 104 105 106 112* 111  CO2 20* 20* 21* 20* 17*  GLUCOSE 115* 113* 133* 134* 102*  BUN 41* 36* 30* 32* 30*  CREATININE 1.45* 1.07 0.78 0.91 0.90  CALCIUM 7.7*  7.7* 7.8* 8.2* 8.4*  MG  --   --   --  1.6*  --    Liver Function Tests:  Recent Labs Lab 02/19/17 0311 02/20/17 0403 02/21/17 0253  AST 54* 54* 42*  ALT ALKPHOS 68 71 69  BILITOT 1.4* 1.3* 1.3*  PROT 5.8* 6.0* 5.7*  ALBUMIN 2.4* 2.3* 2.7*   No results for input(s): AMMONIA in the last 168 hours. Coagulation Profile:  Recent Labs Lab 02/19/17 0311 02/20/17 0403 02/21/17 0253 02/22/17 0443  INR 1.89 1.71 1.69 1.54   CBG:  Recent Labs Lab 02/24/17 1105 02/24/17 1632 02/24/17 2059 02/25/17 0027 02/25/17 0736  GLUCAP 116* 109* 107* 122* 110*   Urine analysis:    Component Value Date/Time   COLORURINE AMBER (A) 02/15/2017 2233   APPEARANCEUR HAZY (A) 02/15/2017 2233   LABSPEC 1.020 02/15/2017 2233   PHURINE 5.0 02/15/2017 2233   GLUCOSEU NEGATIVE 02/15/2017  2233   HGBUR NEGATIVE 02/15/2017 2233   BILIRUBINUR SMALL (A) 02/15/2017 2233   KETONESUR NEGATIVE 02/15/2017 2233   PROTEINUR 30 (A) 02/15/2017 2233   UROBILINOGEN 1.0 12/26/2010 0531   NITRITE NEGATIVE 02/15/2017 2233   LEUKOCYTESUR NEGATIVE 02/15/2017 2233   Radiology Studies:  Dg Chest 2 View - Result Date: 02/15/2017  Low lung volumes with subsegmental atelectasis in the right mid lung. No acute infiltrate or edema.   Ct Angio Chest Pe W And/or Wo Contrast - Result Date: 02/15/2017  1. Eccentric clot along the periphery of the left pulmonary artery extending into the lobar and segmental branches of the left lower lobe. There has been interval increase in the clot burden compared to the prior CT with associated moderate luminal narrowing. No definite occlusive thrombus/embolus identified. 2. Advanced coronary vascular calcification. 3. Morphologic changes of cirrhosis with partially visualized moderate upper abdominal ascites, increased since the prior CT.    Scheduled Meds: . atorvastatin  20 mg Oral q1800  . folic acid  1 mg Oral Daily  . lactulose  10 g Oral Daily  . levalbuterol  1.25 mg Nebulization Q6H  . midodrine  10 mg Oral TID WC  . multivitamin with   1 tablet Oral Daily  . nicotine  21 mg Transdermal Daily  . pantoprazole  40 mg Oral Q1200  . thiamine  100 mg Oral Daily  . Warfarin   Does not apply q1800   Continuous Infusions:    LOS: 10 days   Time spent: 25 minutes  Greater than 50% of the time spent on counseling and coordinating the care.  Signature  Shakela Donati A M.D on 02/25/2017 at 11:20 AM  Between 7am to 7pm - Pager - [phone] ( page via [facility], text pages only, please mention full 10 digit call back number).  After 7pm go to [email or web address] - password [facility]  [facility]  - Office  [phone]

## 2017-02-25 NOTE — Plan of Care (Signed)
Problem: Activity: Goal: Risk for activity intolerance will decrease Outcome: Not Progressing Patient still not able to ambulate due to weakness and fatigue. Encouraged and educated. Acknowledged understanding.

## 2017-02-25 NOTE — Progress Notes (Signed)
ANTICOAGULATION CONSULT NOTE - Follow Up Consult  Pharmacy Consult for apixaban Indication: pulmonary embolus  Labs:  Recent Labs  02/23/17 0534 02/24/17 0426  HGB  --  10.0*  HCT  --  29.6*  PLT  --  127*  CREATININE 0.91 0.90     Assessment: 70 yoM on warfarin PTA presents with concern for PE. CT showed increased clot burden compared to prior CT but no definite thrombus/embolus identified. Has a history of PE and DVT in 2015 and 2016. INR subtherapeutic on admit, warfarin started with heparin bridge now transitioned to apixaban.  Plan:  -Continue apixaban  BID then transition to  BID starting 4/4 -Monitor SCr, CBC  [place]onia Highland, PharmD PGY-1 Pharmacy Resident Pager: [phone] 02/25/2017

## 2017-02-25 NOTE — Progress Notes (Signed)
[name] PROGRESS NOTE Subjective patient states that he feels much better. He stated that he has passed some air.  Objective: Vital signs in last 24 hours: Temp:  [98.2 F (36.8 C)-98.7 F (37.1 C)] 98.2 F (36.8 C) (03/31 0854) Pulse Rate:  [98-123] 104 (03/31 0355) Resp:  [15-26] 16 (03/31 0355) BP: (98-115)/(61-75) 98/61 (03/31 0744) SpO2:  [99 %-100 %] 99 % (03/31 0744) Weight:  [66.7 kg (147 lb)] 66.7 kg (147 lb) (03/31 0355) Last BM Date: 02/24/17  Intake/Output from previous day: 03/30 0701 - 03/31 0700 In: 743 [P.O.:540; I.V.:3; IV Piggyback:200] Out: 1250 [Stool:1250] Intake/Output this shift: No intake/output data recorded.  PE: General-- much more alert and animated still appears very frail  Abdomen-- less distended, nontender has a few bowel sounds.  Lab Results:  Recent Labs  02/24/17 0426  WBC 13.0*  HGB 10.0*  HCT 29.6*  PLT 127*   BMET  Recent Labs  02/23/17 0534 02/24/17 0426  NA 137 136  K 5.3* 5.0  CL 112* 111  CO2 20* 17*  CREATININE 0.91 0.90   LFT No results for input(s): PROT, AST, ALT, ALKPHOS, BILITOT, BILIDIR, IBILI in the last 72 hours. PT/INR No results for input(s): LABPROT, INR in the last 72 hours. PANCREAS No results for input(s): LIPASE in the last 72 hours.       Studies/Results: Dg Abd 1 View  Result Date: 02/24/2017 CLINICAL DATA:  Ileus. EXAM: ABDOMEN - 1 VIEW COMPARISON:  02/23/2017 FINDINGS: Diffuse gaseous distention of bowel wall again noted, not significantly changed since prior study. No free air organomegaly. IMPRESSION: Stable diffuse gaseous distention of bowel, likely ileus. Electronically Signed   By: Charlett Nose M.D.   On: 02/24/2017 08:10    Medications: I have reviewed the patient's current medications.  Assessment/Plan: 1. Ileus. The etiology of this is unclear but for whatever reason it seems to have improved with IV antibiotics and metoclopramide. He is still quite sick. I  think we can go ahead and start him on small amounts of clear liquids and try some Miralax and see if we can get things moving. If so, hopefully we can get everything over to PO.   Amish Mintzer JR,Jujuan L 02/25/2017, 9:18 AM  This note was created using voice recognition software. Minor errors may Have occurred unintentionally.  Pager: [phone] If no answer or after hours call [phone]

## 2017-02-26 ENCOUNTER — Inpatient Hospital Stay (HOSPITAL_COMMUNITY): Payer: Medicare Other

## 2017-02-26 DIAGNOSIS — K567 Ileus, unspecified: Secondary | ICD-10-CM

## 2017-02-26 LAB — BASIC METABOLIC PANEL
Anion gap: 6 (ref 5–15)
BUN: 28 mg/dL — AB (ref 6–20)
CHLORIDE: 109 mmol/L (ref 101–111)
CO2: 17 mmol/L — AB (ref 22–32)
Calcium: 7.9 mg/dL — ABNORMAL LOW (ref 8.9–10.3)
Creatinine, Ser: 0.91 mg/dL (ref 0.61–1.24)
GFR calc Af Amer: >60 mL/min (ref >60)
GFR calc non Af Amer: >60 mL/min (ref >60)
Glucose, Bld: 130 mg/dL — ABNORMAL HIGH (ref 65–99)
POTASSIUM: 3.3 mmol/L — AB (ref 3.5–5.1)
SODIUM: 132 mmol/L — AB (ref 135–145)

## 2017-02-26 LAB — CBC
HEMATOCRIT: 29 % — AB (ref 39.0–52.0)
Hemoglobin: 9.9 g/dL — ABNORMAL LOW (ref 13.0–17.0)
MCH: 35.1 pg — AB (ref 26.0–34.0)
MCHC: 34.1 g/dL (ref 30.0–36.0)
MCV: 102.8 fL — AB (ref 78.0–100.0)
Platelets: 125 10*3/uL — ABNORMAL LOW (ref 150–400)
RBC: 2.82 MIL/uL — AB (ref 4.22–5.81)
RDW: 17.8 % — AB (ref 11.5–15.5)
WBC: 12.8 10*3/uL — AB (ref 4.0–10.5)

## 2017-02-26 LAB — GLUCOSE, CAPILLARY: Glucose-Capillary: 110 mg/dL — ABNORMAL HIGH (ref 65–99)

## 2017-02-26 MED ORDER — CIPROFLOXACIN HCL 500 MG PO TABS
500.0000 mg | ORAL_TABLET | Freq: Two times a day (BID) | ORAL | Status: DC
  Administered 2017-02-26 – 2017-03-01 (×7): 500 mg via ORAL
  Filled 2017-02-26 (×7): qty 1

## 2017-02-26 MED ORDER — METOCLOPRAMIDE HCL 10 MG PO TABS
10.0000 mg | ORAL_TABLET | Freq: Three times a day (TID) | ORAL | Status: DC
  Administered 2017-02-26 – 2017-03-01 (×9): 10 mg via ORAL
  Filled 2017-02-26 (×10): qty 1

## 2017-02-26 NOTE — Progress Notes (Signed)
qPhysical Therapy Treatment Patient Details Name: Jeremy Herman MRN: [id number] DOB: Feb 01, 1946 Today's Date: 02/26/2017    History of Present Illness Jeremy Herman is a 71 y.o. male with PE and bil DVT. Pt also with Ascites due to alcoholic cirrhosis / Acute hepatic encephalopathy. Underwent paracentesis. PMH significant of atrial fibrillation and DVT/PE on Coumadin, hyperlipidemia, tobacco abuse, right heart failure with pulmonary hypertension, alcoholic cirrhosis with ascites, ASD (s/p of repair),     PT Comments    Pt continues to require heavy assist with all mobility. Continues to state that he has people to help at home with all aspects of care including pericare. Currently feel pt is limited most by his fear of falling. Very anxious with all mobility.    Follow Up Recommendations  SNF (Pt refusing SNF; will need to max out HHPT/OT/aide if he continues to refuse)     Equipment Recommendations  Rolling walker with 5" wheels;Wheelchair (measurements PT)    Recommendations for Other Services       Precautions / Restrictions Precautions Precautions: Fall Restrictions Weight Bearing Restrictions: No    Mobility  Bed Mobility Overal bed mobility: Needs Assistance Bed Mobility: Supine to Sit     Supine to sit: Max assist     General bed mobility comments: Assist to bring legs off of bed and to elevate trunk into sitting. Pt with posterior lean likely due to fear of falling  Transfers Overall transfer level: Needs assistance Equipment used: Ambulation equipment used Transfers: Sit to/from [employer] Sit to Stand: Mod assist Stand pivot transfers: Mod assist (with Stedy)       General transfer comment: Assist to bring hips and trunk up. Pt much more relaxed when Stedy brought up to him at bedside. Much less fearful with Stedy.  Ambulation/Gait                 Stairs            Wheelchair Mobility    Modified Rankin (Stroke Patients  Only)       Balance Overall balance assessment: Needs assistance Sitting-balance support: Feet supported;Bilateral upper extremity supported Sitting balance-Leahy Scale: Poor Sitting balance - Comments: Sat EOB with min A due to posterior lean. Once Stedy brought up to pt he held [place] and able to sit with UE support Postural control: Posterior lean Standing balance support: Bilateral upper extremity supported Standing balance-Leahy Scale: Poor Standing balance comment: Stood with Stedy x 2 for ~30 sec with min A.                            Cognition Arousal/Alertness: Awake/alert;Lethargic Behavior During Therapy: Anxious Overall Cognitive Status: No family/caregiver present to determine baseline cognitive functioning                                 General Comments: Pt appears very anxious and fearful of falling      Exercises      General Comments        Pertinent Vitals/Pain Pain Assessment: No/denies pain    Home Living                      Prior Function            PT Goals (current goals can now be found in the care plan section) Progress towards PT goals: Not progressing toward  goals - comment (due to fear of falling)    Frequency    Min 3X/week      PT Plan Current plan remains appropriate    Co-evaluation             End of Session Equipment Utilized During Treatment: Gait belt Activity Tolerance: Other (comment) (fear of falling) Patient left: with call bell/phone within reach;in chair Nurse Communication: Mobility status;Need for lift equipment (Nurse present for transfer) PT Visit Diagnosis: Other abnormalities of gait and mobility (R26.89);Muscle weakness (generalized) (M62.81)     Time: 1610-9604 PT Time Calculation (min) (ACUTE ONLY): 18 min  Charges:  $Therapeutic Activity: 8-22 mins                    G Codes:       [facility] PT [phone]    Angelina Ok [facility] 02/26/2017, 5:21 PM

## 2017-02-26 NOTE — Progress Notes (Signed)
[name] PROGRESS NOTE Subjective patient had large bowel movement and reports that he has been passing air. Flatten upright abdomen shows improvement in ileus.  Objective: Vital signs in last 24 hours: Temp:  [97.3 F (36.3 C)-98.2 F (36.8 C)] 97.5 F (36.4 C) (04/01 0749) Pulse Rate:  [103-117] 103 (04/01 0749) Resp:  [16-37] 28 (04/01 0749) BP: (87-107)/(43-70) 90/62 (04/01 0749) SpO2:  [100 %] 100 % (04/01 0749) Weight:  [68.9 kg (152 lb)] 68.9 kg (152 lb) (04/01 0448) Last BM Date: 02/24/17  Intake/Output from previous day: 03/31 0701 - 04/01 0700 In: 500 [P.O.:300; IV Piggyback:200] Out: 1350 [Stool:1350] Intake/Output this shift: No intake/output data recorded.  PE: General-- frail appearing white male alert and oriented  Abdomen-- still distended but less so few bowel sounds  Lab Results:  Recent Labs  02/24/17 0426 02/26/17 0446  WBC 13.0* 12.8*  HGB 10.0* 9.9*  HCT 29.6* 29.0*  PLT 127* 125*   BMET  Recent Labs  02/24/17 0426 02/26/17 0446  NA 136 132*  K 5.0 3.3*  CL 111 109  CO2 17* 17*  CREATININE 0.90 0.91   LFT No results for input(s): PROT, AST, ALT, ALKPHOS, BILITOT, BILIDIR, IBILI in the last 72 hours. PT/INR No results for input(s): LABPROT, INR in the last 72 hours. PANCREAS No results for input(s): LIPASE in the last 72 hours.       Studies/Results: Dg Abd 2 Views  Result Date: 02/26/2017 CLINICAL DATA:  Diarrhea and abdominal distention. EXAM: ABDOMEN - 2 VIEW COMPARISON:  02/24/2017. FINDINGS: Multiple dilated small bowel loops with mild improvement. Previously demonstrated gaseous distention of the transverse colon is no longer demonstrated. No free peritoneal air. Lumbar spine degenerative changes and hardware fixation of the left femur. IMPRESSION: Improving ileus or partial obstruction. Electronically Signed   By: Beckie Salts M.D.   On: 02/26/2017 08:45    Medications: I have reviewed the patient's current  medications.  Assessment/Plan: 1. Ileus 2. Alcoholics cirrhosis with ascites and encephalopathy 3. Chronic DVT/pulmonary embolus 4. PAF with RVR  Patient does appear to be responding to IV metoclopramide and Cipro. Will change these medications to oral and remove the rectal tube. Advanced to full liquid diet. Our service will check on him tomorrow.   Jeremy Herman,Jeremy Herman 02/26/2017, 9:50 AM  This note was created using voice recognition software. Minor errors may Have occurred unintentionally.  Pager: [phone] If no answer or after hours call [phone]

## 2017-02-26 NOTE — Progress Notes (Signed)
Patient ID: Jeremy Herman, male   DOB: 03-May-1946, 71 y.o.   MRN: [id number]  PROGRESS NOTE  Jeremy Herman  WUJ:[id number] DOB: 1945-12-04 DOA: 02/15/2017  PCP: Lora Paula, MD   Subjective: He feels better today, had large bowel movement, abdomen less distended and less painful. I appreciate GI help, currently on Cipro and Reglan, continued. Tolerated clear liquids, advance to full liquids.  Brief Narrative:  71 y.o. male with medical history significant for atrial fibrillation and DVT/PE on Coumadin, hyperlipidemia, tobacco abuse, right heart failure with pulmonary hypertension, alcoholic cirrhosis with ascites, ASD (s/p of repair), who presented to [facility] with progressive shortness breath and abdominal distention for almost a month and worse in past several days. No chest pain, fevers.  He was found to have subtherapeutic INR 1.31 on admission, negative troponin, BNP 172, negative urinalysis, mild acute renal injury with creatinine 1.22, WBC 19.0. CT angiogram of chest showed interval increase in the clot burden compared to the prior CT with associated moderate luminal narrowing. No definite occlusive thrombus/embolus identified.  Also had significant ascites requiring to ultrasound-guided paracentesis procedures removing 6.5 L of fluid with S AAG of over 1.1, no signs of SBP. He still considerably weak and will require SNF. Social worker is arranging for placement.  Assessment & Plan:   Pulmonary embolus / DVT, bilateral lower limbs (HCC)  -CT angiogram of chest showed interval increase in the clot burden, no definite occlusive thrombus/embolus identified.  -No evidence of right heart straining,  INR was subtherapeutic on admission, ? Compliance with Coumadin -Restarted on Lovenox bridge/Coumadin, this is discontinued currently on Eliquis.  Ileus -This is new since 2/27, continues to have bowel movements, keep NPO, rectal tube for decompression. -Not able to ambulate in the hallway  very well. -Appreciate gastroenterology's help, started on Reglan, trial of clear liquids today. -Reglan, MiraLAX and Senokot-S, on Xifaxan and lactulose for history of hepatic encephalopathy.  Hypotension Chronic  Asymptomatic, continue supplementation with midodrine, PRN albumin and fluid. Will tolerate systolic blood pressures around 85.   Leukocytosis with tachycardia, SIRS vs sepsis, unclear etiology   Paracentesis ruled out SBP, No cough or dysuria. He appears nontoxic stop all antibiotics on 02/21/2017.  AKI - prerenal component due to massive third spacing and low albumin levels, Lasix on hold, on midodrine. Overall prognosis is grim. Palate of care is following for goals of care after discussion with patient he has chosen to become DO NOT RESUSCITATE. He understands that his prognosis is poor.  Ascites due to alcoholic cirrhosis / Acute hepatic encephalopathy -  Patient did not drink alcohol since January per his report, outpatient GI follow-up. He is severely cachectic and wasted, currently DO NOT RESUSCITATE. Not a candidate for aggressive measures at this time. If stable outpatient GI follow-up. He received diagnostic and therapeutic paracentesis on 02/18/2017 and 02/20/2017 with total 6.5L of fluid removed had SAAG > 1, no signs of infection. Patient also placed on Xifaxan on 02/20/2017 for his underlying cirrhosis and ? compliance with lactulose.   Hypokalemia - supplemented IV & PO.  Atrial Fibrillation with RVR  - CHA2DS2-VASc Score is 3, was on Lovenox with Coumadin bridging, if prognosis remains poor then may stop anticoagulation completely in the future will defer that to PCP, currently off beta blocker due to low blood pressures. On digoxin. Changed his anticoagulation to Eliquis.  Tobacco abuse -  urged to quit, on nicotine patch.   Pulmonary hypertension and R ventricular failure - supportive care only unable to diabetes  due to extremely low blood pressures.   Anemia  of chronic disease - stable monitor    Severe protein calorie malnutrition, cachexia and deconditioning. add protein supplement, long-term prognosis appears grim.   Dyslipidemia -  Continue Lipitor  Nausea and vomiting 1 on 02/21/2017. KUB suggestive of mild ileus, switched diet to soft, Dulcolax suppository, monitor electrolytes. Had a bowel movement on the 27th. place on bowel regimen     DVT prophylaxis: on Eliquis Code Status: DNR  Family Communication: no family at the bedside. Disposition Plan: Discharge when ileus resolves  Consultants:   PT  Nutrition   PCCM   IR for paracentesis - cancelled until BP improves   Procedures:   US paracentesis x 2 total 6.5lits fluid removed.  Ct Angio Chest Pe W And/or Wo Contrast - Result Date: 02/15/2017  1. Eccentric clot along the periphery of the left pulmonary artery extending into the lobar and segmental branches of the left lower lobe. There has been interval increase in the clot burden compared to the prior CT with associated moderate luminal narrowing. No definite occlusive thrombus/embolus identified. 2. Advanced coronary vascular calcification. 3. Morphologic changes of cirrhosis with partially visualized moderate upper abdominal ascites, increased since the prior CT.    Major events since admission:    3/22 - more hypotensive, HR up to 160's, WBC still up 19K, transfer to SDU and call PCCM for assistance   Antimicrobials:   Cipro 02/16/2017 --> 02/21/2017   Objective: Vitals:   02/26/17 0900 02/26/17 1000 02/26/17 1100 02/26/17 1137  BP:    (!) 85/67  Pulse: 98 (!) 106 (!) 101 100  Resp: (!) 25 (!) 28 (!) 34 20  Temp:    98.1 F (36.7 C)  TempSrc:    Oral  SpO2: 100% 100% 100% 100%  Weight:      Height:        Intake/Output Summary (Last 24 hours) at 02/26/17 1225 Last data filed at 02/26/17 0930  Gross per 24 hour  Intake              860 ml  Output             1350 ml  Net             -490 ml   Filed  Weights   02/24/17 0300 02/25/17 0355 02/26/17 0448  Weight: 57.2 kg (126 lb) 66.7 kg (147 lb) 68.9 kg (152 lb)    Examination:  General exam: Appears slightly confused but not in acute distress  Respiratory system: diminished, no wheezing  Cardiovascular system: iRRR, tachycardic, no murmurs  Gastrointestinal system: (+) BS, fluid wave (+), distended abd, non tender, Rectal tube has some output.  Central nervous system: No focal neurological deficits. Extremities: Symmetric 5 x 5 power.  Data Reviewed: I have personally reviewed following labs and imaging studies  CBC:  Recent Labs Lab 02/20/17 0403 02/21/17 0253 02/22/17 0443 02/24/17 0426 02/26/17 0446  WBC 12.3* 10.8* 14.0* 13.0* 12.8*  HGB 10.2* 9.6* 10.2* 10.0* 9.9*  HCT 29.7* 27.7* 29.8* 29.6* 29.0*  MCV 103.5* 104.1* 102.4* 103.1* 102.8*  PLT 109* 101* 109* 127* 125*   Basic Metabolic Panel:  Recent Labs Lab 02/20/17 0403 02/21/17 0253 02/23/17 0534 02/24/17 0426 02/26/17 0446  NA 134* 136 137 136 132*  K 3.4* 3.2* 5.3* 5.0 3.3*  CL 105 106 112* 111 109  CO2 20* 21* 20* 17* 17*  GLUCOSE 113* 133* 134* 102* 130*  BUN 36* 30*  32* 30* 28*  CREATININE 1.07 0.78 0.91 0.90 0.91  CALCIUM 7.7* 7.8* 8.2* 8.4* 7.9*  MG  --   --  1.6*  --   --    Liver Function Tests:  Recent Labs Lab 02/20/17 0403 02/21/17 0253  AST 54* 42*  ALT 26 21  ALKPHOS 71 69  BILITOT 1.3* 1.3*  PROT 6.0* 5.7*  ALBUMIN 2.3* 2.7*   No results for input(s): AMMONIA in the last 168 hours. Coagulation Profile:  Recent Labs Lab 02/20/17 0403 02/21/17 0253 02/22/17 0443  INR 1.71 1.69 1.54   CBG:  Recent Labs Lab 02/25/17 0736 02/25/17 1134 02/25/17 1632 02/25/17 2142 02/26/17 0748  GLUCAP 110* 144* 152* 146* 110*   Urine analysis:    Component Value Date/Time   COLORURINE AMBER (A) 02/15/2017 2233   APPEARANCEUR HAZY (A) 02/15/2017 2233   LABSPEC 1.020 02/15/2017 2233   PHURINE 5.0 02/15/2017 2233   GLUCOSEU  NEGATIVE 02/15/2017 2233   HGBUR NEGATIVE 02/15/2017 2233   BILIRUBINUR SMALL (A) 02/15/2017 2233   KETONESUR NEGATIVE 02/15/2017 2233   PROTEINUR 30 (A) 02/15/2017 2233   UROBILINOGEN 1.0 12/26/2010 0531   NITRITE NEGATIVE 02/15/2017 2233   LEUKOCYTESUR NEGATIVE 02/15/2017 2233   Radiology Studies:  Dg Chest 2 View - Result Date: 02/15/2017  Low lung volumes with subsegmental atelectasis in the right mid lung. No acute infiltrate or edema.   Ct Angio Chest Pe W And/or Wo Contrast - Result Date: 02/15/2017  1. Eccentric clot along the periphery of the left pulmonary artery extending into the lobar and segmental branches of the left lower lobe. There has been interval increase in the clot burden compared to the prior CT with associated moderate luminal narrowing. No definite occlusive thrombus/embolus identified. 2. Advanced coronary vascular calcification. 3. Morphologic changes of cirrhosis with partially visualized moderate upper abdominal ascites, increased since the prior CT.    Scheduled Meds: . atorvastatin  20 mg Oral q1800  . folic acid  1 mg Oral Daily  . lactulose  10 g Oral Daily  . levalbuterol  1.25 mg Nebulization Q6H  . midodrine  10 mg Oral TID WC  . multivitamin with   1 tablet Oral Daily  . nicotine  21 mg Transdermal Daily  . pantoprazole  40 mg Oral Q1200  . thiamine  100 mg Oral Daily  . Warfarin   Does not apply q1800   Continuous Infusions:    LOS: 11 days   Time spent: 25 minutes  Greater than 50% of the time spent on counseling and coordinating the care.  Signature  Dallyn Bergland A M.D on 02/26/2017 at 12:25 PM  Between 7am to 7pm - Pager - [phone] ( page via [facility], text pages only, please mention full 10 digit call back number).  After 7pm go to [email or web address] - password [facility]  [facility]  - Office  [phone]

## 2017-02-27 LAB — GLUCOSE, CAPILLARY: GLUCOSE-CAPILLARY: 118 mg/dL — AB (ref 65–99)

## 2017-02-27 NOTE — Hospital Discharge Follow-Up (Signed)
Met with the patient and Theda Sers, [place]. The patient is known to [facility] and has been meeting regularly with the [facility] to monitor his INRs. He stated that at this time he feels too weak to go home and is agreeable to going to SNF knowing the he needs to be able to safely manage his ADLs prior to going home. He said that he prefers to go to [facility] as he has been there in the past but then noted that he owes the facility money,  Voicemail message left for Chriss Driver- [place],RN CM noting that the patient would like to be discharged to [facility] when he is ready for discharge.

## 2017-02-27 NOTE — Progress Notes (Signed)
qPhysical Therapy Treatment Patient Details Name: Jeremy Herman MRN: [id number] DOB: 1946-05-06 Today's Date: 02/27/2017    History of Present Illness Jeremy Herman is a 71 y.o. male with PE and bil DVT. Pt also with Ascites due to alcoholic cirrhosis / Acute hepatic encephalopathy. Underwent paracentesis. PMH significant of atrial fibrillation and DVT/PE on Coumadin, hyperlipidemia, tobacco abuse, right heart failure with pulmonary hypertension, alcoholic cirrhosis with ascites, ASD (s/p of repair),     PT Comments    Pt wanting to get OOB but very fearful with mobility, even when using stedy. Could not give sufficient effort (was actually resisting), until +2 help present. HR up to 150 bpm when very anxious. Pt agrewable to SNF at this point, realizing that he won't be able to mobilize at home. PT will continue to follow.    Follow Up Recommendations  SNF     Equipment Recommendations  Rolling walker with 5" wheels;Wheelchair (measurements PT)    Recommendations for Other Services       Precautions / Restrictions Precautions Precautions: Fall Restrictions Weight Bearing Restrictions: No    Mobility  Bed Mobility Overal bed mobility: Needs Assistance Bed Mobility: Supine to Sit     Supine to sit: Supervision     General bed mobility comments: pt able to get to EOB with increased time but without physical assist  Transfers Overall transfer level: Needs assistance Equipment used: Ambulation equipment used Transfers: Sit to/from Stand;Stand Pivot Transfers Sit to Stand: +2 physical assistance;Mod assist Stand pivot transfers: Total assist [facility])       General transfer comment: pt attempted to stand 4x on his own with stedy but was very fearful and was afriad to give full effort. With +1 assist, he was still fearful and pulling back even as he was leaning forward. With +2 assist, he was able to calm down and stand with less assist than he had been given with +1 when he  was unable to achieve standing. HR up to 150 when very anxious  Ambulation/Gait                 Stairs            Wheelchair Mobility    Modified Rankin (Stroke Patients Only)       Balance Overall balance assessment: Needs assistance Sitting-balance support: Single extremity supported;Feet supported Sitting balance-Leahy Scale: Fair Sitting balance - Comments: maintainted sitting EOB with SL support x10 mins with no LOB Postural control: Posterior lean Standing balance support: Bilateral upper extremity supported Standing balance-Leahy Scale: Poor Standing balance comment: maintained full standing in stedy almost 1 minute gripping handrail                            Cognition Arousal/Alertness: Awake/alert Behavior During Therapy: Anxious Overall Cognitive Status: No family/caregiver present to determine baseline cognitive functioning Area of Impairment: Problem solving                   Current Attention Level: Selective   Following Commands: Follows one step commands with increased time     Problem Solving: Decreased initiation;Slow processing General Comments: VERY anxious      Exercises General Exercises - Lower Extremity Ankle Circles/Pumps: AROM;Both;10 reps;Seated Long Arc Quad: AROM;Both;10 reps;Seated Heel Slides: AROM;Both;10 reps;Supine Hip Flexion/Marching: AROM;Both;10 reps;Seated    General Comments        Pertinent Vitals/Pain Pain Assessment: No/denies pain    Home Living  Prior Function            PT Goals (current goals can now be found in the care plan section) Acute Rehab PT Goals Patient Stated Goal: pt now agreeable to SNF, realized that he won't be able to be mobile at home PT Goal Formulation: With patient Time For Goal Achievement: 03/03/17 Potential to Achieve Goals: Fair Progress towards PT goals: Progressing toward goals    Frequency    Min 3X/week       PT Plan Current plan remains appropriate    Co-evaluation             End of Session   Activity Tolerance: Treatment limited secondary to agitation Patient left: in chair;with call Jeremy/phone within reach;with chair alarm set Nurse Communication: Mobility status;Need for lift equipment PT Visit Diagnosis: Other abnormalities of gait and mobility (R26.89);Muscle weakness (generalized) (M62.81)     Time: 4098-1191 PT Time Calculation (min) (ACUTE ONLY): 41 min  Charges:  $Therapeutic Exercise: 8-22 mins $Therapeutic Activity: 23-37 mins                    G Codes:       Lyanne Co, PT  Acute Rehab Services  [phone]    Jeremy Herman 02/27/2017, 11:53 AM

## 2017-02-27 NOTE — Progress Notes (Addendum)
Patient ID: Jeremy Herman, male   DOB: 1945/12/11, 71 y.o.   MRN: [id number]  PROGRESS NOTE  Jeremy Herman  WUJ:[id number] DOB: [birth year] DOA: 02/15/2017  PCP: Lora Paula, MD   Subjective: Having multiple bowel movements, he is on multiple laxatives. Tolerated full liquids very well, advanced to heart healthy diet. He change his mind about going to SNF, ask CSW for evaluation.  Brief Narrative:  71 y.o. male with medical history significant for atrial fibrillation and DVT/PE on Coumadin, hyperlipidemia, tobacco abuse, right heart failure with pulmonary hypertension, alcoholic cirrhosis with ascites, ASD (s/p of repair), who presented to [facility] with progressive shortness breath and abdominal distention for almost a month and worse in past several days. No chest pain, fevers.  He was found to have subtherapeutic INR 1.31 on admission, negative troponin, BNP 172, negative urinalysis, mild acute renal injury with creatinine 1.22, WBC 19.0. CT angiogram of chest showed interval increase in the clot burden compared to the prior CT with associated moderate luminal narrowing. No definite occlusive thrombus/embolus identified.  Also had significant ascites requiring to ultrasound-guided paracentesis procedures removing 6.5 L of fluid with S AAG of over 1.1, no signs of SBP. He still considerably weak and will require SNF. Social worker is arranging for placement.  Assessment & Plan:   Pulmonary embolus / DVT, bilateral lower limbs (HCC)  -CT angiogram of chest showed interval increase in the clot burden, no definite occlusive thrombus/embolus identified.  -No evidence of right heart straining,  INR was subtherapeutic on admission, ? Compliance with Coumadin -Was on Lovenox bridge/Coumadin, this is discontinued currently on Eliquis.  Ileus -This is new since 2/27, continues to have bowel movements, keep NPO, rectal tube for decompression. -Not able to ambulate in the hallway very  well. -Appreciate gastroenterology's help, started on Reglan, trial of clear liquids today. -Reglan, MiraLAX and Senokot-S, on Xifaxan and lactulose for history of hepatic encephalopathy.  Hypotension Chronic  Asymptomatic, continue supplementation with midodrine, PRN albumin and fluid. Will tolerate SBP around 85.   Leukocytosis with tachycardia, SIRS vs sepsis, unclear etiology   Paracentesis ruled out SBP, No cough or dysuria. He appears nontoxic stop all antibiotics on 02/21/2017.  AKI - prerenal component due to massive third spacing and low albumin levels, Lasix on hold, on midodrine. Overall prognosis is grim. Palate of care is following for goals of care after discussion with patient he has chosen to become DO NOT RESUSCITATE. He understands that his prognosis is poor.  Ascites due to alcoholic cirrhosis / Acute hepatic encephalopathy -  Patient did not drink alcohol since January per his report, outpatient GI follow-up. He is severely cachectic and wasted, currently DO NOT RESUSCITATE. Not a candidate for aggressive measures at this time. If stable outpatient GI follow-up. He received diagnostic and therapeutic paracentesis on 02/18/2017 and 02/20/2017 with total 6.5L of fluid removed had SAAG > 1, no signs of infection. Patient also placed on Xifaxan on 02/20/2017 for his underlying cirrhosis and ? compliance with lactulose.   Hypokalemia - supplemented IV & PO.  Atrial Fibrillation with RVR  - CHA2DS2-VASc Score is 3, was on Lovenox with Coumadin bridging, if prognosis remains poor then may stop anticoagulation completely in the future will defer that to PCP. Restarted metoprolol with holding parameters for low blood pressure. On digoxin. Changed his anticoagulation to Eliquis.  Tobacco abuse -  urged to quit, on nicotine patch.   Pulmonary hypertension and R ventricular failure - supportive care only unable to diabetes due to  extremely low blood pressures.   Anemia of chronic  disease - stable monitor    Severe protein calorie malnutrition, cachexia and deconditioning. add protein supplement, long-term prognosis appears grim.   Dyslipidemia -  Continue Lipitor  Nausea and vomiting 1 on 02/21/2017. KUB suggestive of mild ileus, switched diet to soft, Dulcolax suppository, monitor electrolytes. Had a bowel movement on the 27th. place on bowel regimen     DVT prophylaxis: on Eliquis Code Status: DNR  Family Communication: no family at the bedside. Disposition Plan: Discharge when ileus resolves  Consultants:   PT  Nutrition   PCCM   IR for paracentesis - cancelled until BP improves   Procedures:   US paracentesis x 2 total 6.5lits fluid removed.  Ct Angio Chest Pe W And/or Wo Contrast - Result Date: 02/15/2017  1. Eccentric clot along the periphery of the left pulmonary artery extending into the lobar and segmental branches of the left lower lobe. There has been interval increase in the clot burden compared to the prior CT with associated moderate luminal narrowing. No definite occlusive thrombus/embolus identified. 2. Advanced coronary vascular calcification. 3. Morphologic changes of cirrhosis with partially visualized moderate upper abdominal ascites, increased since the prior CT.    Major events since admission:    3/22 - more hypotensive, HR up to 160's, WBC still up 19K, transfer to SDU and call PCCM for assistance   Antimicrobials:   Cipro 02/16/2017 --> 02/21/2017   Objective: Vitals:   02/27/17 0500 02/27/17 0730 02/27/17 0845 02/27/17 1123  BP: 107/65 (!) 89/61  91/72  Pulse: (!) 101 (!) 101 (!) 105 (!) 122  Resp: (!) 22 (!) 21  (!) 29  Temp:  98.2 F (36.8 C)  97.2 F (36.2 C)  TempSrc:  Oral  Oral  SpO2: 99% 100%  97%  Weight:      Height:        Intake/Output Summary (Last 24 hours) at 02/27/17 1207 Last data filed at 02/26/17 2030  Gross per 24 hour  Intake              460 ml  Output                0 ml  Net               460 ml   Filed Weights   02/24/17 0300 02/25/17 0355 02/26/17 0448  Weight: 57.2 kg (126 lb) 66.7 kg (147 lb) 68.9 kg (152 lb)    Examination:  General exam: Appears slightly confused but not in acute distress  Respiratory system: diminished, no wheezing  Cardiovascular system: iRRR, tachycardic, no murmurs  Gastrointestinal system: (+) BS, fluid wave (+), distended abd, non tender, Rectal tube has some output.  Central nervous system: No focal neurological deficits. Extremities: Symmetric 5 x 5 power.  Data Reviewed: I have personally reviewed following labs and imaging studies  CBC:  Recent Labs Lab 02/21/17 0253 02/22/17 0443 02/24/17 0426 02/26/17 0446  WBC 10.8* 14.0* 13.0* 12.8*  HGB 9.6* 10.2* 10.0* 9.9*  HCT 27.7* 29.8* 29.6* 29.0*  MCV 104.1* 102.4* 103.1* 102.8*  PLT 101* 109* 127* 125*   Basic Metabolic Panel:  Recent Labs Lab 02/21/17 0253 02/23/17 0534 02/24/17 0426 02/26/17 0446  NA 136 137 136 132*  K 3.2* 5.3* 5.0 3.3*  CL 106 112* 111 109  CO2 21* 20* 17* 17*  GLUCOSE 133* 134* 102* 130*  BUN 30* 32* 30* 28*  CREATININE 0.78 0.91 0.90  0.91  CALCIUM 7.8* 8.2* 8.4* 7.9*  MG  --  1.6*  --   --    Liver Function Tests:  Recent Labs Lab 02/21/17 0253  AST 42*  ALT 21  ALKPHOS 69  BILITOT 1.3*  PROT 5.7*  ALBUMIN 2.7*   No results for input(s): AMMONIA in the last 168 hours. Coagulation Profile:  Recent Labs Lab 02/21/17 0253 02/22/17 0443  INR 1.69 1.54   CBG:  Recent Labs Lab 02/25/17 1134 02/25/17 1632 02/25/17 2142 02/26/17 0748 02/27/17 0729  GLUCAP 144* 152* 146* 110* 118*   Urine analysis:    Component Value Date/Time   COLORURINE AMBER (A) 02/15/2017 2233   APPEARANCEUR HAZY (A) 02/15/2017 2233   LABSPEC 1.020 02/15/2017 2233   PHURINE 5.0 02/15/2017 2233   GLUCOSEU NEGATIVE 02/15/2017 2233   HGBUR NEGATIVE 02/15/2017 2233   BILIRUBINUR SMALL (A) 02/15/2017 2233   KETONESUR NEGATIVE 02/15/2017 2233    PROTEINUR 30 (A) 02/15/2017 2233   UROBILINOGEN 1.0 12/26/2010 0531   NITRITE NEGATIVE 02/15/2017 2233   LEUKOCYTESUR NEGATIVE 02/15/2017 2233   Radiology Studies:  Dg Chest 2 View - Result Date: 02/15/2017  Low lung volumes with subsegmental atelectasis in the right mid lung. No acute infiltrate or edema.   Ct Angio Chest Pe W And/or Wo Contrast - Result Date: 02/15/2017  1. Eccentric clot along the periphery of the left pulmonary artery extending into the lobar and segmental branches of the left lower lobe. There has been interval increase in the clot burden compared to the prior CT with associated moderate luminal narrowing. No definite occlusive thrombus/embolus identified. 2. Advanced coronary vascular calcification. 3. Morphologic changes of cirrhosis with partially visualized moderate upper abdominal ascites, increased since the prior CT.    Scheduled Meds: . atorvastatin  20 mg Oral q1800  . folic acid  1 mg Oral Daily  . lactulose  10 g Oral Daily  . levalbuterol  1.25 mg Nebulization Q6H  . midodrine  10 mg Oral TID WC  . multivitamin with   1 tablet Oral Daily  . nicotine  21 mg Transdermal Daily  . pantoprazole  40 mg Oral Q1200  . thiamine  100 mg Oral Daily  . Warfarin   Does not apply q1800   Continuous Infusions:    LOS: 12 days   Time spent: 25 minutes  Greater than 50% of the time spent on counseling and coordinating the care.  Signature  Jenniah Bhavsar A M.D on 02/27/2017 at 12:07 PM  Between 7am to 7pm - Pager - [phone] ( page via [facility], text pages only, please mention full 10 digit call back number).  After 7pm go to [email or web address] - password [facility]  [facility]  - Office  [phone]

## 2017-02-27 NOTE — Care Management Important Message (Signed)
Important Message  Patient Details  Name: Jeremy Herman MRN: [id number] Date of Birth: 04-27-1946   Medicare Important Message Given:  Yes    [name] [name] 02/27/2017, 4:16 PM

## 2017-02-27 NOTE — Progress Notes (Signed)
[name] Progress Note  Subjective: He states that he is passing a lot of gas. He feels that his abdomen is much less distended than it was.  Objective: Vital signs in last 24 hours: Temp:  [97.6 F (36.4 C)-98.2 F (36.8 C)] 98.2 F (36.8 C) (04/02 0730) Pulse Rate:  [77-108] 105 (04/02 0845) Resp:  [15-34] 21 (04/02 0730) BP: (85-120)/(56-75) 89/61 (04/02 0730) SpO2:  [65 %-100 %] 100 % (04/02 0730) Weight change:    PE:  No distress  Heart regular rhythm  Lungs clear  Abdomen: Somewhat distended but soft and nontender  Lab Results: Results for orders placed or performed during the hospital encounter of 02/15/17 (from the past 24 hour(s))  Glucose, capillary     Status: Abnormal   Collection Time: 02/27/17  7:29 AM  Result Value Ref Range   Glucose-Capillary 118 (H) 65 - 99 mg/dL    Studies/Results: No results found.    Assessment: Ileus, improving  Cirrhosis of liver with ascites  Plan:   Continue current management.    SAM F Nieshia Larmon 02/27/2017, 10:01 AM  Pager: [phone] If no answer or after 5 PM call [phone]

## 2017-02-28 LAB — DIGOXIN LEVEL: DIGOXIN LVL: 0.3 ng/mL — AB (ref 0.8–2.0)

## 2017-02-28 LAB — GLUCOSE, CAPILLARY: Glucose-Capillary: 119 mg/dL — ABNORMAL HIGH (ref 65–99)

## 2017-02-28 MED ORDER — LACTULOSE 10 GM/15ML PO SOLN: 10.0000 g | Freq: Three times a day (TID) | ORAL | 0 refills | Status: DC

## 2017-02-28 MED ORDER — RIFAXIMIN 550 MG PO TABS: 550.0000 mg | ORAL_TABLET | Freq: Two times a day (BID) | ORAL | Status: DC

## 2017-02-28 MED ORDER — METOPROLOL TARTRATE 25 MG PO TABS: 12.5000 mg | ORAL_TABLET | Freq: Two times a day (BID) | ORAL | Status: DC

## 2017-02-28 MED ORDER — DIGOXIN 125 MCG PO TABS: 0.1250 mg | ORAL_TABLET | Freq: Every day | ORAL | Status: DC

## 2017-02-28 MED ORDER — SPIRONOLACTONE 100 MG PO TABS: 100.0000 mg | ORAL_TABLET | Freq: Every day | ORAL | 2 refills | Status: DC

## 2017-02-28 MED ORDER — APIXABAN 5 MG PO TABS: 5.0000 mg | ORAL_TABLET | Freq: Two times a day (BID) | ORAL | Status: DC

## 2017-02-28 MED ORDER — METOCLOPRAMIDE HCL 10 MG PO TABS: 10.0000 mg | ORAL_TABLET | Freq: Three times a day (TID) | ORAL | Status: DC

## 2017-02-28 NOTE — Progress Notes (Signed)
[name] Progress Note  Subjective: Patient states that his abdomen feels better. He is passing gas and having bowel movements.  Objective: Vital signs in last 24 hours: Temp:  [97.2 F (36.2 C)-99.4 F (37.4 C)] 98.5 F (36.9 C) (04/03 0732) Pulse Rate:  [100-130] 106 (04/03 1004) Resp:  [13-29] 13 (04/03 1000) BP: (91-107)/(63-76) 96/70 (04/03 1000) SpO2:  [97 %-100 %] 97 % (04/03 0732) Weight change:    PE:  No acute distress  Heart regular rhythm  Lungs clear  Abdomen: Bowel sounds present, soft, distended, ascites  Lab Results: Results for orders placed or performed during the hospital encounter of 02/15/17 (from the past 24 hour(s))  Digoxin level     Status: Abnormal   Collection Time: 02/28/17  4:08 AM  Result Value Ref Range   Digoxin Level 0.3 (L) 0.8 - 2.0 ng/mL  Glucose, capillary     Status: Abnormal   Collection Time: 02/28/17  7:32 AM  Result Value Ref Range   Glucose-Capillary 119 (H) 65 - 99 mg/dL    Studies/Results: No results found.    Assessment: Ileus, improved  Alcoholic cirrhosis of liver with ascites stable  Plan:   Continue current management.    SAM F Choua Ikner 02/28/2017, 11:08 AM  Pager: [phone] If no answer or after 5 PM call [phone]

## 2017-02-28 NOTE — Plan of Care (Signed)
Problem: Bowel/Gastric: Goal: Will not experience complications related to bowel motility Outcome: Progressing Patient had a regular BM 02/27/17. But refused his lactalose today, but took his senokot today. Will continue to monitor BM patterns.

## 2017-02-28 NOTE — Progress Notes (Signed)
Patient is discharging to [facility] but before being ale to return to facility patient will have to pay an outstanding balance of 2,563.50 before coming to facility. Patient stated he will pay balance off . Facility will contact patient at bedside to get payment. Facility can take him on 03/01/17  Marrianne Mood, MSW,  [employer] [phone]

## 2017-02-28 NOTE — Care Management Note (Signed)
Case Management Note  Patient Details  Name: Jeremy Herman MRN: [id number] Date of Birth: [birth year]  Subjective/Objective:    PE, DVT              Action/Plan: Discharge Planning: AVS reviewed: Chart reviewed. Pt scheduled dc to SNF. CSW following for SNF placement.   PCP Dessa Phi MD  Expected Discharge Date:  02/28/17               Expected Discharge Plan:  Skilled Nursing Facility  In-House Referral:  Clinical Social Work  Discharge planning Services  CM Consult  Post Acute Care Choice:  NA Choice offered to:  NA  DME Arranged:  N/A DME Agency:  NA  HH Arranged:  NA HH Agency:  NA  Status of Service:  Completed, signed off  If discussed at Long Length of Stay Meetings, dates discussed:    Additional Comments:  Elliot Cousin, RN 02/28/2017, 2:27 PM

## 2017-02-28 NOTE — Discharge Summary (Signed)
Physician Discharge Summary  [facility] ZOX:[id number] DOB: September 27, 1946 DOA: 02/15/2017  PCP: Jeremy Paula, MD  Admit date: 02/15/2017 Discharge date: 02/28/2017  Admitted From: Home Disposition: SNF  Recommendations for Outpatient Follow-up:  1. Follow up with PCP in 1-2 weeks 2. Please obtain BMP/CBC in one week. 3. Monitor abdominal girth closely has ascites and recently had ileus, if he develops constipation reevaluated.  Home Health: NA Equipment/Devices:NA  Discharge Condition: Stable CODE STATUS: DNR Diet recommendation: Diet Heart Room service appropriate? Yes; Fluid consistency: Thin Diet - low sodium heart healthy  Brief/Interim Summary: 71 y.o.malewith medical history significant for atrial fibrillation and DVT/PE on Coumadin, hyperlipidemia, tobacco abuse, right heart failure with pulmonary hypertension, alcoholic cirrhosis with ascites, ASD (s/p of repair), who presented to [facility] with progressive shortness breath and abdominal distention for almost a month and worse in past several days. No chest pain, fevers. He was found to have subtherapeutic INR 1.31 on admission, negative troponin, BNP 172, negative urinalysis, mild acute renal injury with creatinine 1.22, WBC 19.0. CT angiogram of chest showedinterval increase in the clot burden compared to the prior CT with associated moderate luminal narrowing. No definite occlusive thrombus/embolus identified.  Also had significant ascites requiring to ultrasound-guided paracentesis procedures removing 6.5 L of fluid with S AAG of over 1.1, no signs of SBP. He still considerably weak and will require SNF. Social worker is arranging for placement.   Discharge Diagnoses:  Principal Problem:   Pulmonary embolus (HCC) Active Problems:   DVT, bilateral lower limbs (HCC)   Tobacco abuse   Pulmonary hypertension   Ascites   RVF (right ventricular failure) (HCC)   Ascites due to alcoholic cirrhosis (HCC)   Malnutrition of  moderate degree   PE (pulmonary thromboembolism) (HCC)   HLD (hyperlipidemia)   Atrial fibrillation with RVR (HCC)   Protein-calorie malnutrition, severe   Abdominal distension   Palliative care encounter   Ileus [facility])    Pulmonary embolus / DVT, bilateral lower limbs (HCC)  -CT angiogram of chest showedinterval increase in the clot burden, no definite occlusive thrombus/embolus identified.  -No evidence of right heart straining,  INR was subtherapeutic on admission, ? Compliance with Coumadin -Was on Lovenox bridge/Coumadin, discontinued, discharged home on Eliquis.  Ileus -This is new since 2/27, continues to have bowel movements, keep NPO, rectal tube for decompression. -Not able to ambulate in the hallway very well. -Appreciate gastroenterology's help, started on Reglan, trial of clear liquids today. -Reglan, MiraLAX and Senokot-S, on Xifaxan and lactulose for history of hepatic encephalopathy. -Although his abdomen looks distended but he is having multiple daily bowel movements, part of his distention is his ascites. - Hypotension Chronic  Asymptomatic, continue supplementation with midodrine, PRN albumin and fluid. Will tolerate SBP around 85.   Leukocytosis with tachycardia, SIRS vs sepsis, unclear etiology   -Paracentesis ruled out SBP, No cough or dysuria. He appears nontoxic stop all antibiotics on 02/21/2017.  AKI - prerenal component due to massive third spacing and low albumin levels, Lasix on hold, on midodrine. Overall prognosis is grim. Palate of care is following for goals of care after discussion with patient he has chosen to become DO NOT RESUSCITATE.  -[name] understands his poor for, restarted back on his Lasix 40 mg and Aldactone 100 mg  Ascites due to alcoholic cirrhosis / Acute hepatic encephalopathy -  Patient did not drink alcohol since January per his report, outpatient GI follow-up. He is severely cachectic and wasted, currently DO NOT  RESUSCITATE. Not  a candidate for aggressive measures at this time. If stable outpatient GI follow-up. He received diagnostic and therapeutic paracentesis on 02/18/2017 and 02/20/2017 with total 6.5L of fluid removed had SAAG > 1, no signs of infection. Discharge on Xifaxan and lactulose. Very poor prognosis and high risk for readmission.   Hypokalemia - supplemented IV & PO.  Atrial Fibrillation with RVR  - CHA2DS2-VASc Scoreis 3, was on Lovenox with Coumadin bridging, if prognosis remains poor then may stop anticoagulation completely in the future will defer that to PCP. Restarted metoprolol with holding parameters for low blood pressure. On digoxin. Changed his anticoagulation to Eliquis.  Tobacco abuse -  urged to quit, on nicotine patch.   Pulmonary hypertension and R ventricular failure - supportive care only unable to diabetes due to extremely low blood pressures.   Anemia of chronic disease - stable monitor    Severe protein calorie malnutrition, cachexia and deconditioning. add protein supplement, long-term prognosis appears grim. Severe cachexia and muscle wasting from hepatic cirrhosis.   Dyslipidemia -  Continue Lipitor  Nausea and vomiting 1 on 02/21/2017. KUB suggestive of mild ileus, switched diet to soft, Dulcolax suppository, monitor electrolytes. Had a bowel movement on the 27th. place on bowel regimen.   Discharge Instructions  Discharge Instructions    Diet - low sodium heart healthy    Complete by:  As directed    Increase activity slowly    Complete by:  As directed      Allergies as of 02/28/2017      Reactions   Fish Allergy Anaphylaxis, Other (See Comments)   Diarrhea    Ambien [zolpidem Tartrate]       Medication List    STOP taking these medications   atorvastatin 20 MG tablet Commonly known as:  LIPITOR   traMADol 50 MG tablet Commonly known as:  ULTRAM   warfarin 2 MG tablet Commonly known as:  COUMADIN     TAKE these medications    apixaban 5 MG Tabs tablet Commonly known as:  ELIQUIS Take 1 tablet (5 mg total) by mouth 2 (two) times daily. Start taking on:  03/01/2017   digoxin 0.125 MG tablet Commonly known as:  LANOXIN Take 1 tablet (0.125 mg total) by mouth daily. Start taking on:  03/01/2017   feeding supplement (ENSURE ENLIVE) Liqd Take 237 mLs by mouth 2 (two) times daily between meals.   folic acid 1 MG tablet Commonly known as:  FOLVITE Take 1 tablet (1 mg total) by mouth daily.   furosemide 40 MG tablet Commonly known as:  LASIX Take 1 tablet (40 mg total) by mouth daily.   lactulose 10 GM/15ML solution Commonly known as:  CHRONULAC Take 15 mLs (10 g total) by mouth 3 (three) times daily.   metoCLOPramide 10 MG tablet Commonly known as:  REGLAN Take 1 tablet (10 mg total) by mouth 3 (three) times daily before meals.   metoprolol tartrate 25 MG tablet Commonly known as:  LOPRESSOR Take 0.5 tablets (12.5 mg total) by mouth 2 (two) times daily.   midodrine 10 MG tablet Commonly known as:  PROAMATINE Take 1 tablet (10 mg total) by mouth 3 (three) times daily with meals. What changed:  when to take this  additional instructions   multivitamin tablet Take 1 tablet by mouth daily.   rifaximin 550 MG Tabs tablet Commonly known as:  XIFAXAN Take 1 tablet (550 mg total) by mouth 2 (two) times daily.   senna-docusate 8.6-50 MG tablet Commonly known as:  Senokot-S Take 1 tablet by mouth at bedtime as needed for mild constipation.   spironolactone 100 MG tablet Commonly known as:  ALDACTONE Take 1 tablet (100 mg total) by mouth daily. What changed:  how much to take   thiamine 100 MG tablet Commonly known as:  VITAMIN B-1 Take 1 tablet (100 mg total) by mouth daily.      Follow-up Information    Jeremy Paula, MD Follow up in 1 week(s).   Specialty:  Family Medicine Contact information: [address] [place] [address] [phone]          Allergies  Allergen  Reactions  . Fish Allergy Anaphylaxis and Other (See Comments)    Diarrhea   . Ambien [Zolpidem Tartrate]     Consultations:  Treatment Team:   Carman Ching, MD  Procedures (Echo, Carotid, EGD, Colonoscopy, ERCP)   Radiological studies: Dg Chest 2 View  Result Date: 02/15/2017 CLINICAL DATA:  Shortness of breath for 4 weeks EXAM: CHEST  2 VIEW COMPARISON:  01/11/2017 FINDINGS: There are low lung volumes. Subsegmental atelectasis in the right mid lung. No acute infiltrate or effusion. Heart size within normal limits. New ASD closure device. Atherosclerosis. No pneumothorax. IMPRESSION: Low lung volumes with subsegmental atelectasis in the right mid lung. No acute infiltrate or edema. Electronically Signed   By: Jasmine Pang M.D.   On: 02/15/2017 16:51   Dg Abd 1 View  Result Date: 02/24/2017 CLINICAL DATA:  Ileus. EXAM: ABDOMEN - 1 VIEW COMPARISON:  02/23/2017 FINDINGS: Diffuse gaseous distention of bowel wall again noted, not significantly changed since prior study. No free air organomegaly. IMPRESSION: Stable diffuse gaseous distention of bowel, likely ileus. Electronically Signed   By: Charlett Nose M.D.   On: 02/24/2017 08:10   Dg Abd 1 View  Result Date: 02/23/2017 CLINICAL DATA:  Abdominal distension EXAM: ABDOMEN - 1 VIEW COMPARISON:  KUB of February 21, 2017 FINDINGS: Persistent distention of small and large bowel loops with gas. There is some gas in the rectum. No free extraluminal gas collections are observed. There is degenerative disc disease of the lumbar spine. IMPRESSION: Stable distention of small and large bowel loops consistent with ileus. Electronically Signed   By: David  [place] M.D.   On: 02/23/2017 07:44   Dg Abd 1 View  Result Date: 02/21/2017 CLINICAL DATA:  Constipation.  Abdominal pain EXAM: ABDOMEN - 1 VIEW COMPARISON:  CT abdomen 2158 FINDINGS: There gas-filled loops of large and small bowel. Small amount of stool in the descending colon. Gas in the sigmoid  colon. Small a gas in the rectum. IMPRESSION: Gas distended loops of large small bowel. No evidence bowel obstruction. Minimal stool burden. Electronically Signed   By: Genevive Bi M.D.   On: 02/21/2017 10:34   Ct Angio Chest Pe W And/or Wo Contrast  Result Date: 02/15/2017 CLINICAL DATA:  71 year old male with shortness of breath. History of pulmonary embolism. EXAM: CT ANGIOGRAPHY CHEST WITH CONTRAST TECHNIQUE: Multidetector CT imaging of the chest was performed using the standard protocol during bolus administration of intravenous contrast. Multiplanar CT image reconstructions and MIPs were obtained to evaluate the vascular anatomy. CONTRAST:  100 cc Isovue 370 COMPARISON:  Chest CT dated 11/09/2017 and 07/08/2014 FINDINGS: Cardiovascular: There is no cardiomegaly or pericardial effusion. Advanced multi vessel coronary vascular calcification involving the LAD, left circumflex artery, and right RCA. There is moderate atherosclerotic calcification of the thoracic aorta. No aneurysmal dilatation or evidence of dissection. There is non opacification of the  visualized proximal left vertebral artery. This is of indeterminate chronicity. Correlation with clinical exam is recommended. CT angiography of the neck is recommended if there is clinical concern for acute left vertebral artery occlusion. There is dilatation of the main pulmonary artery suggestive of underlying pulmonary hypertension. Eccentric clot noted along the walls of the left pulmonary artery extending to the lobar and segmental branches of the left lower lobe. There is interval increase in the clot burden compared to the prior CT of 01/10/2017. There is associated luminal narrowing of the the lobar and segmental branches of the left lower lobe pulmonary arteries without definite evidence of complete occlusion. There is no CT evidence of right heart straining. Mediastinum/Nodes: No hilar or mediastinal adenopathy. The esophagus and the thyroid  gland are grossly unremarkable. Lungs/Pleura: Right lung base linear atelectasis/ scarring noted. There is no focal consolidation, pleural effusion, or pneumothorax. The central airways are patent. Upper Abdomen: There is irregularity of the hepatic contour concerning for underlying cirrhosis. Partially visualized moderate size ascites in the upper abdomen has increased compared to the prior CT. Musculoskeletal: Osteopenia with degenerative changes of the spine. No acute fracture. There is loss of subcutaneous fat and cachexia. Review of the MIP images confirms the above findings. IMPRESSION: 1. Eccentric clot along the periphery of the left pulmonary artery extending into the lobar and segmental branches of the left lower lobe. There has been interval increase in the clot burden compared to the prior CT with associated moderate luminal narrowing. No definite occlusive thrombus/embolus identified. 2. Advanced coronary vascular calcification. 3. Morphologic changes of cirrhosis with partially visualized moderate upper abdominal ascites, increased since the prior CT. These results were called by telephone at the time of interpretation on 02/15/2017 at 10:48 pm to Dr. Crista Curb , who verbally acknowledged these results. Electronically Signed   By: Elgie Collard M.D.   On: 02/15/2017 22:51   US Renal  Result Date: 02/17/2017 CLINICAL DATA:  Patient with acute renal failure. EXAM: RENAL / URINARY TRACT ULTRASOUND COMPLETE COMPARISON:  CT abdomen pelvis 01/12/2017 FINDINGS: Right Kidney: Length: 13.0 cm. Echogenicity within normal limits. No mass or hydronephrosis visualized. Left Kidney: Length: 11.5 cm. Echogenicity within normal limits. No mass or hydronephrosis visualized. Bladder: Decompressed. Ascites is noted throughout the abdomen. IMPRESSION: No hydronephrosis. Ascites. Electronically Signed   By: Annia Belt M.D.   On: 02/17/2017 21:39   US Paracentesis  Result Date: 02/18/2017 INDICATION: History of  alcoholic cirrhosis with ascites. Leukocytosis and abdominal pain. Request diagnostic and therapeutic paracentesis. EXAM: ULTRASOUND GUIDED LEFT LOWER QUADRANT PARACENTESIS MEDICATIONS: None. COMPLICATIONS: None immediate. PROCEDURE: Informed written consent was obtained from the patient after a discussion of the risks, benefits and alternatives to treatment. A timeout was performed prior to the initiation of the procedure. Initial ultrasound scanning demonstrates a large amount of ascites within the right lower abdominal quadrant. The right lower abdomen was prepped and draped in the usual sterile fashion. 1% lidocaine with epinephrine was used for local anesthesia. Following this, a 19 gauge, 7-cm, Yueh catheter was introduced. An ultrasound image was saved for documentation purposes. The paracentesis was performed. The catheter was removed and a dressing was applied. The patient tolerated the procedure well without immediate post procedural complication. FINDINGS: A total of approximately 2.0 L of cloudy yellow fluid was removed. Procedure terminated at this point due to hypotension. Residual ascites persists. Samples were sent to the laboratory as requested by the clinical team. IMPRESSION: Successful ultrasound-guided paracentesis yielding 2.0 liters of peritoneal  fluid. The patient may return for therapeutic large volume paracentesis when blood pressure is improved. Read by: Brayton El PA-C Electronically Signed   By: Richarda Overlie M.D.   On: 02/18/2017 09:53   Dg Abd 2 Views  Result Date: 02/26/2017 CLINICAL DATA:  Diarrhea and abdominal distention. EXAM: ABDOMEN - 2 VIEW COMPARISON:  02/24/2017. FINDINGS: Multiple dilated small bowel loops with mild improvement. Previously demonstrated gaseous distention of the transverse colon is no longer demonstrated. No free peritoneal air. Lumbar spine degenerative changes and hardware fixation of the left femur. IMPRESSION: Improving ileus or partial obstruction.  Electronically Signed   By: Beckie Salts M.D.   On: 02/26/2017 08:45   Ir Paracentesis  Result Date: 02/20/2017 INDICATION: History of alcoholic cirrhosis with ascites. Leukocytosis and abdominal pain. Request therapeutic paracentesis. EXAM: ULTRASOUND GUIDED RIGHT LATERAL ABDOMEN PARACENTESIS MEDICATIONS: 1% Lidocaine = 10 mL. COMPLICATIONS: None immediate. PROCEDURE: Informed written consent was obtained from the patient after a discussion of the risks, benefits and alternatives to treatment. A timeout was performed prior to the initiation of the procedure. Initial ultrasound scanning demonstrates a moderate amount of ascites within the right lower abdominal quadrant. The right lower abdomen was prepped and draped in the usual sterile fashion. 1% lidocaine with epinephrine was used for local anesthesia. Following this, a 19 gauge, 7-cm, Yueh catheter was introduced. An ultrasound image was saved for documentation purposes. The paracentesis was performed. The catheter was removed and a dressing was applied. The patient tolerated the procedure well without immediate post procedural complication. FINDINGS: A total of approximately 4.3 liters of clear yellow fluid was removed. Samples were sent to the laboratory as requested by the clinical team. IMPRESSION: Successful ultrasound-guided paracentesis yielding 4.3 liters of peritoneal fluid. Read by:  Corrin Parker, PA-C Electronically Signed   By: Jolaine Click M.D.   On: 02/20/2017 13:14     Subjective:  Discharge Exam: Vitals:   02/28/17 0732 02/28/17 1000 02/28/17 1004 02/28/17 1134  BP: 98/76 96/70  99/70  Pulse: (!) 123 100 (!) 106 (!) 105  Resp: Temp: 98.5 F (36.9 C)   98.5 F (36.9 C)  TempSrc: Oral   Oral  SpO2: 97%     Weight:      Height:       General: Pt is alert, awake, not in acute distress Cardiovascular: RRR, S1/S2 +, no rubs, no gallops Respiratory: CTA bilaterally, no wheezing, no rhonchi Abdominal: Soft, NT, ND,  bowel sounds + Extremities: no edema, no cyanosis   The results of significant diagnostics from this hospitalization (including imaging, microbiology, ancillary and laboratory) are listed below for reference.    Microbiology: Recent Results (from the past 240 hour(s))  MRSA PCR Screening     Status: None   Collection Time: 02/20/17  3:26 PM  Result Value Ref Range Status   MRSA by PCR NEGATIVE NEGATIVE Final    Comment:        The GeneXpert MRSA Assay (FDA approved for NASAL specimens only), is one component of a comprehensive MRSA colonization surveillance program. It is not intended to diagnose MRSA infection nor to guide or monitor treatment for MRSA infections.      Labs: BNP (last 3 results)  Recent Labs  01/10/17 1029 02/15/17 1615  BNP 93.8 172.1*   Basic Metabolic Panel:  Recent Labs Lab 02/23/17 0534 02/24/17 0426 02/26/17 0446  NA 137 136 132*  K 5.3* 5.0 3.3*  CL 112* 111 109  CO2  20* 17* 17*  GLUCOSE 134* 102* 130*  BUN 32* 30* 28*  CREATININE 0.91 0.90 0.91  CALCIUM 8.2* 8.4* 7.9*  MG 1.6*  --   --    Liver Function Tests: No results for input(s): AST, ALT, ALKPHOS, BILITOT, PROT, ALBUMIN in the last 168 hours. No results for input(s): LIPASE, AMYLASE in the last 168 hours. No results for input(s): AMMONIA in the last 168 hours. CBC:  Recent Labs Lab 02/22/17 0443 02/24/17 0426 02/26/17 0446  WBC 14.0* 13.0* 12.8*  HGB 10.2* 10.0* 9.9*  HCT 29.8* 29.6* 29.0*  MCV 102.4* 103.1* 102.8*  PLT 109* 127* 125*   Cardiac Enzymes: No results for input(s): CKTOTAL, CKMB, CKMBINDEX, TROPONINI in the last 168 hours. BNP: Invalid input(s): POCBNP CBG:  Recent Labs Lab 02/25/17 1632 02/25/17 2142 02/26/17 0748 02/27/17 0729 02/28/17 0732  GLUCAP 152* 146* 110* 118* 119*   D-Dimer No results for input(s): DDIMER in the last 72 hours. Hgb A1c No results for input(s): HGBA1C in the last 72 hours. Lipid Profile No results for  input(s): CHOL, HDL, LDLCALC, TRIG, CHOLHDL, LDLDIRECT in the last 72 hours. Thyroid function studies No results for input(s): TSH, T4TOTAL, T3FREE, THYROIDAB in the last 72 hours.  Invalid input(s): FREET3 Anemia work up No results for input(s): VITAMINB12, FOLATE, FERRITIN, TIBC, IRON, RETICCTPCT in the last 72 hours. Urinalysis    Component Value Date/Time   COLORURINE AMBER (A) 02/15/2017 2233   APPEARANCEUR HAZY (A) 02/15/2017 2233   LABSPEC 1.020 02/15/2017 2233   PHURINE 5.0 02/15/2017 2233   GLUCOSEU NEGATIVE 02/15/2017 2233   HGBUR NEGATIVE 02/15/2017 2233   BILIRUBINUR SMALL (A) 02/15/2017 2233   KETONESUR NEGATIVE 02/15/2017 2233   PROTEINUR 30 (A) 02/15/2017 2233   UROBILINOGEN 1.0 12/26/2010 0531   NITRITE NEGATIVE 02/15/2017 2233   LEUKOCYTESUR NEGATIVE 02/15/2017 2233   Sepsis Labs Invalid input(s): PROCALCITONIN,  WBC,  LACTICIDVEN Microbiology Recent Results (from the past 240 hour(s))  MRSA PCR Screening     Status: None   Collection Time: 02/20/17  3:26 PM  Result Value Ref Range Status   MRSA by PCR NEGATIVE NEGATIVE Final    Comment:        The GeneXpert MRSA Assay (FDA approved for NASAL specimens only), is one component of a comprehensive MRSA colonization surveillance program. It is not intended to diagnose MRSA infection nor to guide or monitor treatment for MRSA infections.      Time coordinating discharge: Over 30 minutes  SIGNED:   Clint Lipps, MD  [facility] 02/28/2017, 12:07 PM Pager   If 7PM-7AM, please contact night-coverage [email or web address] Password [email or web address]

## 2017-03-01 DIAGNOSIS — R0602 Shortness of breath: Secondary | ICD-10-CM

## 2017-03-01 DIAGNOSIS — E43 Unspecified severe protein-calorie malnutrition: Secondary | ICD-10-CM

## 2017-03-01 LAB — GLUCOSE, CAPILLARY: Glucose-Capillary: 127 mg/dL — ABNORMAL HIGH (ref 65–99)

## 2017-03-01 NOTE — Progress Notes (Signed)
Progress Note    Jeremy Herman  ZOX:[id number] DOB: [birth year]  DOA: 02/15/2017 PCP: Jeremy Paula, MD    Brief Narrative:   Chief complaint: F/U SOB/Ascites  Medical records reviewed and are as summarized below:  Jeremy Herman is an 71 y.o. male with medical history significant for atrial fibrillation and DVT/PE on Coumadin, hyperlipidemia, tobacco abuse, right heart failure with pulmonary hypertension, alcoholic cirrhosis with ascites, ASD (s/p of repair), who presented to [facility] with progressive shortness breath and abdominal distention for almost a month and worse in past several days. No chest pain, fevers.  He was found to have subtherapeutic INR 1.31 on admission, negative troponin, BNP 172, negative urinalysis, mild acute renal injury with creatinine 1.22, WBC 19.0. CT angiogram of chest showedinterval increase in the clot burden compared to the prior CT with associated moderate luminal narrowing. No definite occlusive thrombus/embolus identified.  Also had significant ascites requiring to ultrasound-guided paracentesis procedures removing 6.5 L of fluid with S AAG of over 1.1, no signs of SBP. He still considerably weak and will require SNF. Social worker is arranging for placement.  Assessment/Plan:   Pulmonary embolus / DVT, bilateral lower limbs (HCC)  -CT angiogram of chest showedinterval increase in the clot burden, no definite occlusive thrombus/embolus identified.  -No evidence of right heart straining, INR was subtherapeutic on admission, ? Compliance with Coumadin -Was onLovenox bridge/Coumadin, discontinued, discharged home on Eliquis.  Ileus -This is new since 2/27, continues to have bowel movements, keep NPO, rectal tube for decompression. -Not able to ambulate in the hallway very well. -Appreciate gastroenterology's help, started on Reglan, trial of clear liquids today. -Reglan, MiraLAX and Senokot-S, on Xifaxan and lactulose for history of hepatic  encephalopathy. -Although his abdomen looks distended but he is having multiple daily bowel movements, part of his distention is his ascites.  Hypotension Chronic  Asymptomatic, continue supplementation with midodrine, PRN albumin and fluid. Will tolerate SBParound 85.   Leukocytosis with tachycardia, SIRS vs sepsis, unclear etiology  -Paracentesis ruled out SBP, No cough or dysuria. He appears nontoxic stop all antibiotics on 02/21/2017.  AKI - prerenal component due to massive third spacing and low albumin levels, Lasix on hold, on midodrine. Overall prognosis is grim. Palate of care is following for goals of care after discussion with patient he has chosen to become DO NOT RESUSCITATE.  -Jeremy Herman understands his poor for, restarted back on his Lasix 40 mg and Aldactone 100 mg  Ascites due to alcoholic cirrhosis / Acute hepatic encephalopathy - Patient did not drink alcohol since January per his report, outpatient GI follow-up. He is severely cachectic and wasted, currently DO NOT RESUSCITATE. Not a candidate for aggressive measures at this time. If stable outpatient GI follow-up. He received diagnostic and therapeutic paracentesis on 02/18/2017 and 02/20/2017 with total 6.5L of fluid removed had SAAG >1, no signs of infection. Discharge on Xifaxan and lactulose. Very poor prognosis and high risk for readmission.  Hypokalemia - supplemented IV & PO.  Atrial Fibrillation with RVR - CHA2DS2-VASc Scoreis 3, was on Lovenox with Coumadin bridging, if prognosis remains poor then may stop anticoagulation completely in the future will defer that to PCP. Restarted metoprolol with holding parameters for low blood pressure. On digoxin. Changed his anticoagulation to Eliquis.  Tobacco abuse - urged to quit, on nicotine patch.  Pulmonary hypertension and R ventricular failure -supportive care only unable to diabetes due to extremely low blood pressures.  Anemia of chronic disease -  stable monitor  Severe protein calorie malnutrition, cachexia and deconditioning. add protein supplement, long-term prognosis appears grim. Severe cachexia and muscle wasting from hepatic cirrhosis.  Dyslipidemia - Continue Lipitor  Nausea and vomiting 1 on 02/21/2017.KUB suggestive of mild ileus, switched diet to soft, Dulcolax suppository, monitor electrolytes. Had a bowel movement on the 27th. place on bowel regimen. Body mass index is 20.61 kg/m.  *Note: Patient was discharged 02/28/17 however his SNF facility is awaiting for him to settle his bill prior to accepting him back. No change in assessment or plan, stable for discharge when bed available.  Family Communication/Anticipated D/C date and plan/Code Status   DVT prophylaxis: Eliquis ordered. Code Status: Full Code.  Family Communication: None present. Disposition Plan: Stable for discharge to SNF.   Medical Consultants:    Pulmonology   Procedures:    US paracentesis x 2 total 6.5lits fluid removed.  Anti-Infectives:    None  Subjective:   "Physically I feel OK, but emotionally I feel like shit". Feels depressed.   Objective:    Vitals:   03/01/17 0400 03/01/17 0805 03/01/17 0905 03/01/17 1100  BP: 107/70  102/72   Pulse: (!) 101 (!) 103 (!) 110 (!) 101  Resp: (!) Temp: 98.2 F (36.8 C) 98.4 F (36.9 C)  98.2 F (36.8 C)  TempSrc: Oral Oral  Oral  SpO2: 100% 100%  99%  Weight:      Height:        Intake/Output Summary (Last 24 hours) at 03/01/17 1254 Last data filed at 03/01/17 1000  Gross per 24 hour  Intake              603 ml  Output                0 ml  Net              603 ml   Filed Weights   02/24/17 0300 02/25/17 0355 02/26/17 0448  Weight: 57.2 kg (126 lb) 66.7 kg (147 lb) 68.9 kg (152 lb)    Exam: General exam: Appears calm and comfortable. Chronically ill appearing & cachectic. Respiratory system: Clear to auscultation. Respiratory effort  normal. Cardiovascular system: S1 & S2 heard, RRR. No JVD,  rubs, gallops or clicks. No murmurs. Gastrointestinal system: Abdomen is Softly distended,  nontender. No organomegaly or masses felt. Normal bowel sounds heard. Central nervous system: Alert and oriented. No focal neurological deficits. Extremities: No clubbing,  or cyanosis. 2-3+ edema. Venous stasis changes. Skin: No rashes, lesions or ulcers. Telangectasias.  Psychiatry: Judgement and insight appear impaired. Mood & affect depressed.   Data Reviewed:   I have personally reviewed following labs and imaging studies:  Labs: Basic Metabolic Panel:  Recent Labs Lab 02/23/17 0534 02/24/17 0426 02/26/17 0446  NA 137 136 132*  K 5.3* 5.0 3.3*  CL 112* 111 109  CO2 20* 17* 17*  GLUCOSE 134* 102* 130*  BUN 32* 30* 28*  CREATININE 0.91 0.90 0.91  CALCIUM 8.2* 8.4* 7.9*  MG 1.6*  --   --    GFR Estimated Creatinine Clearance: 74.7 mL/min (by C-G formula based on SCr of 0.91 mg/dL). Liver Function Tests: No results for input(s): AST, ALT, ALKPHOS, BILITOT, PROT, ALBUMIN in the last 168 hours. No results for input(s): LIPASE, AMYLASE in the last 168 hours. No results for input(s): AMMONIA in the last 168 hours. Coagulation profile No results for input(s): INR, PROTIME in the last 168 hours.  CBC:  Recent  Labs Lab 02/24/17 0426 02/26/17 0446  WBC 13.0* 12.8*  HGB 10.0* 9.9*  HCT 29.6* 29.0*  MCV 103.1* 102.8*  PLT 127* 125*   Cardiac Enzymes: No results for input(s): CKTOTAL, CKMB, CKMBINDEX, TROPONINI in the last 168 hours. BNP (last 3 results) No results for input(s): PROBNP in the last 8760 hours. CBG:  Recent Labs Lab 02/25/17 2142 02/26/17 0748 02/27/17 0729 02/28/17 0732 03/01/17 0731  GLUCAP 146* 110* 118* 119* 127*   D-Dimer: No results for input(s): DDIMER in the last 72 hours. Hgb A1c: No results for input(s): HGBA1C in the last 72 hours. Lipid Profile: No results for input(s): CHOL, HDL,  LDLCALC, TRIG, CHOLHDL, LDLDIRECT in the last 72 hours. Thyroid function studies: No results for input(s): TSH, T4TOTAL, T3FREE, THYROIDAB in the last 72 hours.  Invalid input(s): FREET3 Anemia work up: No results for input(s): VITAMINB12, FOLATE, FERRITIN, TIBC, IRON, RETICCTPCT in the last 72 hours. Sepsis Labs:  Recent Labs Lab 02/24/17 0426 02/26/17 0446  WBC 13.0* 12.8*    Microbiology Recent Results (from the past 240 hour(s))  MRSA PCR Screening     Status: None   Collection Time: 02/20/17  3:26 PM  Result Value Ref Range Status   MRSA by PCR NEGATIVE NEGATIVE Final    Comment:        The GeneXpert MRSA Assay (FDA approved for NASAL specimens only), is one component of a comprehensive MRSA colonization surveillance program. It is not intended to diagnose MRSA infection nor to guide or monitor treatment for MRSA infections.     Radiology: No results found.  Medications:   . apixaban  5 mg Oral BID  . atorvastatin  20 mg Oral q1800  . chlorhexidine  15 mL Mouth Rinse BID  . ciprofloxacin  500 mg Oral BID  . digoxin  0.125 mg Oral Daily  . feeding supplement  1 Container Oral BID BM  . feeding supplement (PRO-STAT SUGAR FREE 64)  30 mL Oral BID  . folic acid  1 mg Oral Daily  . lactulose  10 g Oral TID  . mouth rinse  15 mL Mouth Rinse q12n4p  . metoCLOPramide  10 mg Oral TID AC  . metoprolol tartrate  12.5 mg Oral BID  . midodrine  10 mg Oral TID WC  . multivitamin with minerals  1 tablet Oral Daily  . nicotine  21 mg Transdermal Daily  . pantoprazole  40 mg Oral Q1200  . polyethylene glycol  17 g Oral BID  . rifaximin  550 mg Oral BID  . senna-docusate  2 tablet Oral BID  . sodium chloride flush  3 mL Intravenous Q12H  . thiamine  100 mg Oral Daily   Continuous Infusions:   Medical decision making is of low complexity and therefore this is a level 1 visit. (> 2 problem points, 1 data points, low risk)  Problems/DDx Points   Self limited or  minor (max 2)       1   Established problem, stable       1   Established problem, worsening       2   New problem, no additional W/U planned (max 1)       3   New problem, additional W/U planned        4    Data Reviewed Points   Review/order clinical lab tests       1   Review/order x-rays       1  Review/order tests (Echo, EKG, PFTs, etc)       1   Discussion of test results w/ performing MD       1   Independent review of image, tracing or specimen       2   Decision to obtain old records       1   Review and summation of old records       2    Level of risk Presenting prob Diagnostics Management   Minimal 1 self limited/minor Labs CXR EKG/EEG U/A U/S Rest Gargles Bandages Dressings   Low 2 or more self limited/minor 1 stable chronic Acute uncomplicated illness Tests (PFTS) Non-CV imaging Arterial labs Biopsies of skin OTC drugs Minor surgery-no risk PT OT IVF without additives    Moderate 1 or more chronic illnesses w/ mild exac, progression or S/E from tx 2 or more stable chronic illnesses Undiagnosed new problem w/ uncertain prognosis Acute complicated injury  Stress tests Endoscopies with no risk factors Deep needle or incisional bx CV imaging without risk LP Thoracentesis Paracentesis Minor surgery w/ risks Elective major surgery w/ no risk (open, percutaneous or endoscopic) Prescription drugs Therapeutic nucl med IVF with additives Closed tx of fracture/dislocation    High Severe exac of chronic illness Acute or chronic illness/injury may pose a threat to life or bodily function (ARF) Change in neuro status    CV imaging w/ contrast and risk Cardio electophysiologic tests Endoscopies w/ risk Discography Elective major surgery Emergency major surgery Parenteral controlled substances Drug therapy req monitoring for toxicity DNR/de-escalation of care    MDM Prob points Data points Risk   Straightforward    <1    <1    Min   Low complexity     2    2    Low   Moderate    3    3    Mod   High Complexity    4 or more    4 or more    High      LOS: 14 days   RAMA,CHRISTINA  [facility] Pager [phone]. If unable to reach me by pager, please call my cell phone at [phone].  *Please refer to [email or web address], password [email or web address] to get updated schedule on who will round on this patient, as hospitalists switch teams weekly. If 7PM-7AM, please contact night-coverage at [email or web address], password [email or web address] for any overnight needs.  03/01/2017, 12:54 PM

## 2017-03-01 NOTE — Plan of Care (Signed)
Problem: Health Behavior/Discharge Planning: Goal: Ability to manage health-related needs will improve Outcome: Progressing Patient is waiting bed placement at SNF. Worj=king with CSW.

## 2017-03-01 NOTE — Progress Notes (Signed)
Clinical Social Worker facilitated patient discharge including contacting patient family and facility to confirm patient discharge plans.  Clinical information faxed to facility and family agreeable with plan.  CSW arranged ambulance transport via [facility] to [facility] .  RN to call [phone](pt will be in room 206) for report prior to discharge.  Clinical Social Worker will sign off for now as social work intervention is no longer needed. Please consult [place] again if new need arises.  Marrianne Mood, MSW, [employer] [phone]

## 2017-03-01 NOTE — Progress Notes (Signed)
Patient being discharged / transferred to [facility] SNF per MD order. Report called to [place], RN @ 1600 on 03/01/2017. Patient will transfer by [facility]. All belongings sent with patient.

## 2017-03-08 ENCOUNTER — Encounter (HOSPITAL_COMMUNITY): Payer: Self-pay | Admitting: Emergency Medicine

## 2017-03-08 ENCOUNTER — Inpatient Hospital Stay (HOSPITAL_COMMUNITY)
Admission: EM | Admit: 2017-03-08 | Discharge: 2017-03-12 | DRG: 871 | Disposition: A | Discharge status: Skilled Nursing Facility | Payer: Medicare Other | Attending: Family Medicine | Admitting: Family Medicine

## 2017-03-08 ENCOUNTER — Emergency Department (HOSPITAL_COMMUNITY): Payer: Medicare Other

## 2017-03-08 DIAGNOSIS — I2782 Chronic pulmonary embolism: Secondary | ICD-10-CM | POA: Diagnosis present

## 2017-03-08 DIAGNOSIS — Z8774 Personal history of (corrected) congenital malformations of heart and circulatory system: Secondary | ICD-10-CM

## 2017-03-08 DIAGNOSIS — Z7901 Long term (current) use of anticoagulants: Secondary | ICD-10-CM

## 2017-03-08 DIAGNOSIS — Z515 Encounter for palliative care: Secondary | ICD-10-CM | POA: Diagnosis not present

## 2017-03-08 DIAGNOSIS — K7290 Hepatic failure, unspecified without coma: Secondary | ICD-10-CM | POA: Diagnosis present

## 2017-03-08 DIAGNOSIS — R64 Cachexia: Secondary | ICD-10-CM | POA: Diagnosis present

## 2017-03-08 DIAGNOSIS — Z888 Allergy status to other drugs, medicaments and biological substances status: Secondary | ICD-10-CM | POA: Diagnosis not present

## 2017-03-08 DIAGNOSIS — Z800 Family history of malignant neoplasm of digestive organs: Secondary | ICD-10-CM | POA: Diagnosis not present

## 2017-03-08 DIAGNOSIS — K567 Ileus, unspecified: Secondary | ICD-10-CM | POA: Diagnosis present

## 2017-03-08 DIAGNOSIS — Z8701 Personal history of pneumonia (recurrent): Secondary | ICD-10-CM

## 2017-03-08 DIAGNOSIS — N139 Obstructive and reflux uropathy, unspecified: Secondary | ICD-10-CM | POA: Diagnosis present

## 2017-03-08 DIAGNOSIS — I50813 Acute on chronic right heart failure: Secondary | ICD-10-CM | POA: Diagnosis present

## 2017-03-08 DIAGNOSIS — I9589 Other hypotension: Secondary | ICD-10-CM | POA: Diagnosis present

## 2017-03-08 DIAGNOSIS — N179 Acute kidney failure, unspecified: Secondary | ICD-10-CM | POA: Diagnosis not present

## 2017-03-08 DIAGNOSIS — G92 Toxic encephalopathy: Secondary | ICD-10-CM | POA: Diagnosis present

## 2017-03-08 DIAGNOSIS — Z9889 Other specified postprocedural states: Secondary | ICD-10-CM

## 2017-03-08 DIAGNOSIS — I4891 Unspecified atrial fibrillation: Secondary | ICD-10-CM

## 2017-03-08 DIAGNOSIS — I480 Paroxysmal atrial fibrillation: Secondary | ICD-10-CM | POA: Diagnosis present

## 2017-03-08 DIAGNOSIS — J9601 Acute respiratory failure with hypoxia: Secondary | ICD-10-CM | POA: Diagnosis present

## 2017-03-08 DIAGNOSIS — F17210 Nicotine dependence, cigarettes, uncomplicated: Secondary | ICD-10-CM | POA: Diagnosis present

## 2017-03-08 DIAGNOSIS — K7031 Alcoholic cirrhosis of liver with ascites: Secondary | ICD-10-CM | POA: Diagnosis present

## 2017-03-08 DIAGNOSIS — Z6822 Body mass index (BMI) 22.0-22.9, adult: Secondary | ICD-10-CM

## 2017-03-08 DIAGNOSIS — K7682 Hepatic encephalopathy: Secondary | ICD-10-CM

## 2017-03-08 DIAGNOSIS — A419 Sepsis, unspecified organism: Principal | ICD-10-CM | POA: Diagnosis present

## 2017-03-08 DIAGNOSIS — E43 Unspecified severe protein-calorie malnutrition: Secondary | ICD-10-CM | POA: Diagnosis not present

## 2017-03-08 DIAGNOSIS — I2609 Other pulmonary embolism with acute cor pulmonale: Secondary | ICD-10-CM

## 2017-03-08 DIAGNOSIS — Z86718 Personal history of other venous thrombosis and embolism: Secondary | ICD-10-CM

## 2017-03-08 DIAGNOSIS — I272 Pulmonary hypertension, unspecified: Secondary | ICD-10-CM | POA: Diagnosis not present

## 2017-03-08 DIAGNOSIS — I2699 Other pulmonary embolism without acute cor pulmonale: Secondary | ICD-10-CM | POA: Diagnosis not present

## 2017-03-08 DIAGNOSIS — K920 Hematemesis: Secondary | ICD-10-CM | POA: Diagnosis present

## 2017-03-08 DIAGNOSIS — E872 Acidosis: Secondary | ICD-10-CM | POA: Diagnosis present

## 2017-03-08 DIAGNOSIS — J449 Chronic obstructive pulmonary disease, unspecified: Secondary | ICD-10-CM | POA: Diagnosis present

## 2017-03-08 DIAGNOSIS — R627 Adult failure to thrive: Secondary | ICD-10-CM | POA: Diagnosis present

## 2017-03-08 DIAGNOSIS — Z66 Do not resuscitate: Secondary | ICD-10-CM | POA: Diagnosis present

## 2017-03-08 DIAGNOSIS — E785 Hyperlipidemia, unspecified: Secondary | ICD-10-CM | POA: Diagnosis present

## 2017-03-08 DIAGNOSIS — R000 Tachycardia, unspecified: Secondary | ICD-10-CM | POA: Diagnosis present

## 2017-03-08 DIAGNOSIS — Z79899 Other long term (current) drug therapy: Secondary | ICD-10-CM | POA: Diagnosis not present

## 2017-03-08 DIAGNOSIS — D509 Iron deficiency anemia, unspecified: Secondary | ICD-10-CM | POA: Diagnosis present

## 2017-03-08 DIAGNOSIS — N281 Cyst of kidney, acquired: Secondary | ICD-10-CM | POA: Diagnosis present

## 2017-03-08 HISTORY — DX: Heart failure, unspecified: I50.9

## 2017-03-08 HISTORY — DX: Chronic obstructive pulmonary disease, unspecified: J44.9

## 2017-03-08 LAB — I-STAT CG4 LACTIC ACID, ED: Lactic Acid, Venous: 2.43 mmol/L (ref 0.5–1.9)

## 2017-03-08 LAB — I-STAT VENOUS BLOOD GAS, ED
Acid-base deficit: 11 mmol/L — ABNORMAL HIGH (ref 0.0–2.0)
Bicarbonate: 13.7 mmol/L — ABNORMAL LOW (ref 20.0–28.0)
O2 SAT: 52 %
PCO2 VEN: 27.7 mmHg — AB (ref 44.0–60.0)
TCO2: 15 mmol/L (ref 0–100)
pH, Ven: 7.304 (ref 7.250–7.430)
pO2, Ven: 30 mmHg — CL (ref 32.0–45.0)

## 2017-03-08 LAB — CBC WITH DIFFERENTIAL/PLATELET
BASOS ABS: 0 10*3/uL (ref 0.0–0.1)
BASOS PCT: 0 %
EOS PCT: 0 %
Eosinophils Absolute: 0.1 10*3/uL (ref 0.0–0.7)
HCT: 29.4 % — ABNORMAL LOW (ref 39.0–52.0)
Hemoglobin: 10.3 g/dL — ABNORMAL LOW (ref 13.0–17.0)
Lymphocytes Relative: 5 %
Lymphs Abs: 1.2 10*3/uL (ref 0.7–4.0)
MCH: 32.8 pg (ref 26.0–34.0)
MCHC: 35 g/dL (ref 30.0–36.0)
MCV: 93.6 fL (ref 78.0–100.0)
MONO ABS: 1.3 10*3/uL — AB (ref 0.1–1.0)
Monocytes Relative: 6 %
Neutro Abs: 19.6 10*3/uL — ABNORMAL HIGH (ref 1.7–7.7)
Neutrophils Relative %: 89 %
PLATELETS: 186 10*3/uL (ref 150–400)
RBC: 3.14 MIL/uL — ABNORMAL LOW (ref 4.22–5.81)
RDW: 16.8 % — AB (ref 11.5–15.5)
WBC: 22.1 10*3/uL — ABNORMAL HIGH (ref 4.0–10.5)

## 2017-03-08 LAB — COMPREHENSIVE METABOLIC PANEL
ALBUMIN: 2.3 g/dL — AB (ref 3.5–5.0)
ALK PHOS: 87 U/L (ref 38–126)
ALT: 24 U/L (ref 17–63)
ANION GAP: 11 (ref 5–15)
AST: 43 U/L — AB (ref 15–41)
BILIRUBIN TOTAL: 1.3 mg/dL — AB (ref 0.3–1.2)
BUN: 68 mg/dL — AB (ref 6–20)
CALCIUM: 8.2 mg/dL — AB (ref 8.9–10.3)
CO2: 15 mmol/L — ABNORMAL LOW (ref 22–32)
Chloride: 115 mmol/L — ABNORMAL HIGH (ref 101–111)
Creatinine, Ser: 2.91 mg/dL — ABNORMAL HIGH (ref 0.61–1.24)
GFR calc Af Amer: 24 mL/min — ABNORMAL LOW (ref >60)
GFR calc non Af Amer: 21 mL/min — ABNORMAL LOW (ref >60)
GLUCOSE: 161 mg/dL — AB (ref 65–99)
Potassium: 4 mmol/L (ref 3.5–5.1)
Sodium: 141 mmol/L (ref 135–145)
TOTAL PROTEIN: 6.5 g/dL (ref 6.5–8.1)

## 2017-03-08 LAB — PROTIME-INR
INR: 2.53
Prothrombin Time: 27.7 seconds — ABNORMAL HIGH (ref 11.4–15.2)

## 2017-03-08 LAB — I-STAT TROPONIN, ED: TROPONIN I, POC: 0.03 ng/mL (ref 0.00–0.08)

## 2017-03-08 LAB — LIPASE, BLOOD: Lipase: 18 U/L (ref 11–51)

## 2017-03-08 LAB — LACTIC ACID, PLASMA: Lactic Acid, Venous: 3.4 mmol/L (ref 0.5–1.9)

## 2017-03-08 MED ORDER — METOPROLOL TARTRATE 12.5 MG HALF TABLET: 12.5000 mg | ORAL_TABLET | Freq: Two times a day (BID) | ORAL | Status: DC

## 2017-03-08 MED ORDER — SODIUM CHLORIDE 0.9 % IV BOLUS (SEPSIS)
1000.0000 mL | Freq: Once | INTRAVENOUS | Status: AC
  Administered 2017-03-08: 1000 mL via INTRAVENOUS

## 2017-03-08 MED ORDER — MORPHINE SULFATE (PF) 4 MG/ML IV SOLN: 0.5000 mg | INTRAVENOUS | Status: DC | PRN

## 2017-03-08 MED ORDER — LACTULOSE 10 GM/15ML PO SOLN
10.0000 g | Freq: Three times a day (TID) | ORAL | Status: DC
  Filled 2017-03-08 (×2): qty 15

## 2017-03-08 MED ORDER — SODIUM CHLORIDE 0.9 % IV SOLN
1500.0000 mg | Freq: Once | INTRAVENOUS | Status: AC
  Administered 2017-03-08: 1500 mg via INTRAVENOUS
  Filled 2017-03-08: qty 1500

## 2017-03-08 MED ORDER — PANTOPRAZOLE SODIUM 40 MG IV SOLR
40.0000 mg | Freq: Once | INTRAVENOUS | Status: AC
  Administered 2017-03-08: 40 mg via INTRAVENOUS
  Filled 2017-03-08: qty 40

## 2017-03-08 MED ORDER — ONDANSETRON HCL 4 MG/2ML IJ SOLN: 4.0000 mg | Freq: Four times a day (QID) | INTRAMUSCULAR | Status: DC | PRN

## 2017-03-08 MED ORDER — PIPERACILLIN-TAZOBACTAM 3.375 G IVPB
3.3750 g | Freq: Three times a day (TID) | INTRAVENOUS | Status: DC
  Administered 2017-03-09 – 2017-03-12 (×10): 3.375 g via INTRAVENOUS
  Filled 2017-03-08 (×11): qty 50

## 2017-03-08 MED ORDER — MIDODRINE HCL 5 MG PO TABS: 10.0000 mg | ORAL_TABLET | Freq: Three times a day (TID) | ORAL | Status: DC

## 2017-03-08 MED ORDER — PIPERACILLIN-TAZOBACTAM 3.375 G IVPB 30 MIN
3.3750 g | Freq: Once | INTRAVENOUS | Status: AC
  Administered 2017-03-08: 3.375 g via INTRAVENOUS
  Filled 2017-03-08: qty 50

## 2017-03-08 MED ORDER — OCTREOTIDE LOAD VIA INFUSION
50.0000 ug | Freq: Once | INTRAVENOUS | Status: AC
  Administered 2017-03-09: 50 ug via INTRAVENOUS
  Filled 2017-03-08: qty 25

## 2017-03-08 MED ORDER — SODIUM CHLORIDE 0.9 % IV SOLN
50.0000 ug/h | INTRAVENOUS | Status: DC
  Administered 2017-03-09 – 2017-03-10 (×3): 50 ug/h via INTRAVENOUS
  Filled 2017-03-08 (×9): qty 1

## 2017-03-08 MED ORDER — ONDANSETRON HCL 4 MG PO TABS: 4.0000 mg | ORAL_TABLET | Freq: Four times a day (QID) | ORAL | Status: DC | PRN

## 2017-03-08 MED ORDER — RIFAXIMIN 550 MG PO TABS
550.0000 mg | ORAL_TABLET | Freq: Two times a day (BID) | ORAL | Status: DC
  Filled 2017-03-08: qty 1

## 2017-03-08 MED ORDER — IPRATROPIUM-ALBUTEROL 0.5-2.5 (3) MG/3ML IN SOLN: 3.0000 mL | Freq: Once | RESPIRATORY_TRACT | Status: DC

## 2017-03-08 MED ORDER — DILTIAZEM HCL 25 MG/5ML IV SOLN
10.0000 mg | Freq: Once | INTRAVENOUS | Status: AC
  Administered 2017-03-08: 10 mg via INTRAVENOUS
  Filled 2017-03-08: qty 5

## 2017-03-08 MED ORDER — ALBUMIN HUMAN 25 % IV SOLN
25.0000 g | Freq: Once | INTRAVENOUS | Status: AC
  Administered 2017-03-09: 25 g via INTRAVENOUS
  Filled 2017-03-08: qty 50

## 2017-03-08 NOTE — Progress Notes (Signed)
Pharmacy Antibiotic Note  Jeremy Herman is a 71 y.o. male admitted on 03/08/2017 with pneumonia.  Pharmacy has been consulted for vancomycin + zosyn dosing. Significant AKI. SCr up from 0.9 > 2.91.  Plan: Vancomycin *1 tonight Goal trough 15-20 mcg/mL. Zosyn 3.375g IV q8h (4 hour infusion). f/u AM Scr to schedule next dose of vancomycin    Recent Labs Lab 03/08/17 1935 03/08/17 2000  WBC 22.1*  --   CREATININE 2.91*  --   LATICACIDVEN  --  2.43*    Estimated Creatinine Clearance: 23.3 mL/min (A) (by C-G formula based on SCr of 2.91 mg/dL (H)).    Allergies  Allergen Reactions  . Fish Allergy Anaphylaxis and Other (See Comments)    Diarrhea   . Ambien [Zolpidem Tartrate] Other (See Comments)    Antimicrobials this admission: 4/11 vanc >>  4/11 zosyn >>   Dose adjustments this admission: NA  Microbiology results: NA  Thank you for allowing pharmacy to be a part of this patient's care.  Sherron Monday 03/08/2017 9:05 PM

## 2017-03-08 NOTE — ED Provider Notes (Signed)
[facility]-EM[facility]ENCY DEPT Provider Note   CSN: [id number] Arrival date & time: 03/08/17  1913     History   Chief Complaint Chief Complaint  Patient presents with  . Hematemesis    HPI Jeremy Herman is a 71 y.o. male.  HPI   This is a 71 year old male presenting in respiratory distress.  Pt brought in from nursing home according to nursing home patient was attempted to be put on palliative care. It started to get a lot worse. He had several episodes of coughing up blood. And then became unresponsive.  Level V caveat altered mental status.  Patient came in with both a form saying DO NOT RESUSCITATE and a form saying perform CPR.  We attempted to call the 1 friend listed in the contacts, no one picked up.  Past Medical History:  Diagnosis Date  . Arthritis    "right knee" (01/17/2017)  . CHF (congestive heart failure) (HCC)   . Chronic anticoagulation   . COPD (chronic obstructive pulmonary disease) (HCC)   . DVT (deep venous thrombosis) (HCC) 2015; 2016   RLE; LLE  . Paroxysmal atrial fibrillation (HCC)    Hattie Perch 01/17/2017  . PE (pulmonary embolism) 2015; 2016  . Pneumonia 1969    Patient Active Problem List   Diagnosis Date Noted  . Sepsis (HCC) 03/08/2017  . Acute renal failure (ARF) (HCC) 03/08/2017  . Ileus (HCC) 02/23/2017  . Protein-calorie malnutrition, severe 02/17/2017  . Abdominal distension   . Palliative care encounter   . Atrial fibrillation with RVR (HCC) 02/16/2017  . PE (pulmonary thromboembolism) (HCC) 02/15/2017  . HLD (hyperlipidemia) 02/15/2017  . Generalized weakness   . Pressure injury of skin 01/17/2017  . Malnutrition of moderate degree 01/16/2017  . Pulmonary hypertension, primary (HCC)   . RVF (right ventricular failure) (HCC)   . Ascites due to alcoholic cirrhosis (HCC)   . Atrial septal defect, secundum   . SOB (shortness of breath) 01/11/2017  . Pulmonary embolus (HCC)   . Other ascites   . Shortness of breath   . Chronic  pulmonary embolism (HCC) 01/10/2017  . Ascites 01/10/2017  . Macrocytic anemia 12/13/2016  . Pulmonary hypertension 12/09/2016  . Bilateral chronic knee pain 12/09/2016  . Tremor 12/09/2016  . Fracture, intertrochanteric, left femur (HCC) 04/21/2016  . Femur fracture (HCC) 04/20/2016  . Femur fracture, left (HCC) 04/20/2016  . Palpitations 02/09/2016  . Left hip pain   . Hypokalemia   . Megaloblastic anemia   . Thrombocytopenia (HCC)   . Hip fracture (HCC) 10/24/2015  . Tobacco abuse 10/24/2015  . Chronic anticoagulation 10/24/2015  . Chronic hypotension 10/24/2015  . Hip fracture, right (HCC) 10/24/2015  . DVT, lower extremity (HCC)   . Smoker 07/08/2014  . 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 Bilateral pulmonary embolism (HCC) 09/17/2013  . DVT, bilateral lower limbs (HCC) 09/17/2013    Past Surgical History:  Procedure Laterality Date  . ANKLE FRACTURE SURGERY Right 1979   "skiing accident"  . ASD REPAIR  01/17/2017  . ATRIAL SEPTAL DEFECT(ASD) CLOSURE N/A 01/17/2017   Procedure: Atrial Septal Defect(ASD) Closure;  Surgeon: Tonny Bollman, MD;  Location: [facility] INVASIVE CV LAB;  Service: Cardiovascular;  Laterality: N/A;  . CARDIAC CATHETERIZATION  01/13/2017  . FEMUR IM NAIL Left 04/21/2016   Procedure: INTRAMEDULLARY (IM) NAIL LEFT  FEMORAL;  Surgeon: Samson Frederic, MD;  Location: [facility] ORS;  Service: Orthopedics;  Laterality: Left;  . FRACTURE SURGERY    . IR GENERIC HISTORICAL  02/20/2017   IR  PARACENTESIS 02/20/2017 Gershon Crane, PA-C [facility]-INTER[facility] RAD  . RIGHT/LEFT HEART CATH AND CORONARY ANGIOGRAPHY N/A 01/13/2017   Procedure: Right/Left Heart Cath and Coronary Angiography;  Surgeon: Laurey Morale, MD;  Location: [facility] INVASIVE CV LAB;  Service: Cardiovascular;  Laterality: N/A;  . TEE WITHOUT CARDIOVERSION N/A 01/13/2017   Procedure: TRANSESOPHAGEAL ECHOCARDIOGRAM (TEE);  Surgeon: Laurey Morale, MD;  Location: [facility] ENDOSCOPY;  Service: Cardiovascular;  Laterality: N/A;        Home Medications    Prior to Admission medications   Medication Sig Start Date End Date Taking? Authorizing Provider  apixaban (ELIQUIS) 5 MG TABS tablet Take 1 tablet (5 mg total) by mouth 2 (two) times daily. 03/01/17  Yes Clydia Llano, MD  digoxin (LANOXIN) 0.125 MG tablet Take 1 tablet (0.125 mg total) by mouth daily. 03/01/17  Yes Clydia Llano, MD  folic acid (FOLVITE) 1 MG tablet Take 1 tablet (1 mg total) by mouth daily. 12/13/16  Yes Josalyn Funches, MD  furosemide (LASIX) 40 MG tablet Take 1 tablet (40 mg total) by mouth daily. 01/19/17  Yes [name], MD  lactulose (CHRONULAC) 10 GM/15ML solution Take 15 mLs (10 g total) by mouth 3 (three) times daily. 02/28/17  Yes Clydia Llano, MD  metoCLOPramide (REGLAN) 10 MG tablet Take 1 tablet (10 mg total) by mouth 3 (three) times daily before meals. 02/28/17  Yes Clydia Llano, MD  metoprolol tartrate (LOPRESSOR) 25 MG tablet Take 0.5 tablets (12.5 mg total) by mouth 2 (two) times daily. 02/28/17  Yes Clydia Llano, MD  midodrine (PROAMATINE) 10 MG tablet Take 1 tablet (10 mg total) by mouth 3 (three) times daily with meals. 01/18/17  Yes [name], MD  Multiple Vitamins-Minerals (MULTIVITAMIN) tablet Take 1 tablet by mouth daily. 12/13/16  Yes Josalyn Funches, MD  OVER THE COUNTER MEDICATION Take 120 mLs by mouth 2 (two) times daily. Med pass   Yes Historical Provider, MD  rifaximin (XIFAXAN) 550 MG TABS tablet Take 1 tablet (550 mg total) by mouth 2 (two) times daily. 02/28/17  Yes Clydia Llano, MD  senna-docusate (SENOKOT-S) 8.6-50 MG tablet Take 1 tablet by mouth at bedtime as needed for mild constipation. 01/18/17  Yes [name], MD  spironolactone (ALDACTONE) 100 MG tablet Take 1 tablet (100 mg total) by mouth daily. 02/28/17  Yes Clydia Llano, MD  thiamine (VITAMIN B-1) 100 MG tablet Take 1 tablet (100 mg total) by mouth daily. 12/13/16  Yes Dessa Phi, MD    Family History Family History  Problem Relation Age of Onset  . Stomach  cancer Father   . Cancer Mother   . Aneurysm Mother   . Deep vein thrombosis Brother 7    Social History Social History  Substance Use Topics  . Smoking status: Current Every Day Smoker    Packs/day: 1.00    Years: 48.00    Types: Cigarettes  . Smokeless tobacco: Never Used  . Alcohol use No     Comment: Former extensive use; "Quit  in 2001" (01/17/2017)     Allergies   Fish allergy and Ambien [zolpidem tartrate]   Review of Systems Review of Systems  Unable to perform ROS: Acuity of condition     Physical Exam Updated Vital Signs BP (!) 142/96   Pulse 95   Resp (!) 22   SpO2 92%   Physical Exam  Constitutional: He appears well-nourished. He appears distressed.  Agonal rapid breath  HENT:  Head: Normocephalic.  Eyes: Conjunctivae are normal.  Cardiovascular:  Tachycardic irregularly irregular.  Pulmonary/Chest: He is in respiratory distress. He has wheezes.  Neurological: No cranial nerve deficit.  Oriented to person and place.  Skin: Skin is warm. He is diaphoretic.  Psychiatric: He has a normal mood and affect. His behavior is normal.     ED Treatments / Results  Labs (all labs ordered are listed, but only abnormal results are displayed) Labs Reviewed  COMPREHENSIVE METABOLIC PANEL - Abnormal; Notable for the following:       Result Value   Chloride 115 (*)    CO2 15 (*)    Glucose, Bld 161 (*)    BUN 68 (*)    Creatinine, Ser 2.91 (*)    Calcium 8.2 (*)    Albumin 2.3 (*)    AST 43 (*)    Total Bilirubin 1.3 (*)    GFR calc non Af Amer 21 (*)    GFR calc Af Amer 24 (*)    All other components within normal limits  CBC WITH DIFFERENTIAL/PLATELET - Abnormal; Notable for the following:    WBC 22.1 (*)    RBC 3.14 (*)    Hemoglobin 10.3 (*)    HCT 29.4 (*)    RDW 16.8 (*)    Neutro Abs 19.6 (*)    Monocytes Absolute 1.3 (*)    All other components within normal limits  PROTIME-INR - Abnormal; Notable for the following:    Prothrombin Time  27.7 (*)    All other components within normal limits  I-STAT CG4 LACTIC ACID, ED - Abnormal; Notable for the following:    Lactic Acid, Venous 2.43 (*)    All other components within normal limits  I-STAT VENOUS BLOOD GAS, ED - Abnormal; Notable for the following:    pCO2, Ven 27.7 (*)    pO2, Ven 30.0 (*)    Bicarbonate 13.7 (*)    Acid-base deficit 11.0 (*)    All other components within normal limits  CULTURE, BLOOD (SINGLE)  LIPASE, BLOOD  AMMONIA  URINALYSIS, ROUTINE W REFLEX MICROSCOPIC  LACTIC ACID, PLASMA  I-STAT TROPOININ, ED    EKG  EKG Interpretation  Date/Time:  Wednesday March 08 2017 19:30:26 EDT Ventricular Rate:  144 PR Interval:    QRS Duration: 87 QT Interval:  300 QTC Calculation: 476 R Axis:   117 Text Interpretation:  Atrial fibrillation Atrial fibrillation Confirmed by Kandis Mannan ([id number]) on 03/08/2017 9:22:29 PM       Radiology Dg Chest Portable 1 View  Result Date: 03/08/2017 CLINICAL DATA:  Shortness of breath, hypoxia EXAM: PORTABLE CHEST 1 VIEW COMPARISON:  CTA chest dated 02/15/2017 FINDINGS: Mild linear scarring/atelectasis along the right mid lung. No focal consolidation No pleural effusion or pneumothorax. The heart is normal in size. IMPRESSION: No evidence of acute cardiopulmonary disease. Electronically Signed   By: Charline Bills M.D.   On: 03/08/2017 19:54    Procedures Procedures (including critical care time)  Medications Ordered in ED Medications  ipratropium-albuterol (DUONEB) 0.5-2.5 (3) MG/3ML nebulizer solution 3 mL (not administered)  piperacillin-tazobactam (ZOSYN) IVPB 3.375 g (not administered)  octreotide (SANDOSTATIN) 2 mcg/mL load via infusion 50 mcg (not administered)    And  octreotide (SANDOSTATIN) 500 mcg in sodium chloride 0.9 % 250 mL (2 mcg/mL) infusion (not administered)  lactulose (CHRONULAC) 10 GM/15ML solution 10 g (not administered)  metoprolol tartrate (LOPRESSOR) tablet 12.5 mg (not  administered)  rifaximin (XIFAXAN) tablet 550 mg (not administered)  midodrine (PROAMATINE) tablet 10 mg (not administered)  ondansetron (ZOFRAN)  tablet 4 mg (not administered)    Or  ondansetron (ZOFRAN) injection 4 mg (not administered)  morphine 4 MG/ML injection 0.52 mg (not administered)  albumin human 25 % solution 25 g (not administered)  sodium chloride 0.9 % bolus 1,000 mL (0 mLs Intravenous Stopped 03/08/17 2215)  vancomycin (VANCOCIN) 1,500 mg in sodium chloride 0.9 % 500 mL IVPB (1,500 mg Intravenous Transfusing/Transfer 03/08/17 2306)  piperacillin-tazobactam (ZOSYN) IVPB 3.375 g (0 g Intravenous Stopped 03/08/17 2145)  diltiazem (CARDIZEM) injection 10 mg (10 mg Intravenous Given 03/08/17 2156)  pantoprazole (PROTONIX) injection 40 mg (40 mg Intravenous Given 03/08/17 2156)     Initial Impression / Assessment and Plan / ED Course  I have reviewed the triage vital signs and the nursing notes.  Pertinent labs & imaging results that were available during my care of the patient were reviewed by me and considered in my medical decision making (see chart for details).    . 71 y.o.malewith medical history significant for atrial fibrillation and DVT/PE on Coumadin, hyperlipidemia, tobacco abuse, right heart failure with pulmonary hypertension, alcoholic cirrhosis with ascites, ASD (s/p of repair), who presented today with hematesis. On arrival patient appeared incredibly ill. We have multiple conversations discussing his resuscitation. Patient has a DNR/DNI already. This was documented last time he was here. Patient clearly said that he does not want to be intubated. I reapproached patient once he was on BiPAP to ask about resuscitation. He asked him whether he wanted to be made comfortable and let pass if it happened he said yes. This conversation was witnessed by the Diplomatic Services operational officer and nurse. I'm unable to get in touch with any relative or POA.  Currently we will support patient with  everything except resuscitation and intubation.  Not a good waveform but initially patient was hypoxic. Patient could have worsening pulmonary emboli, however he is therapeutic on his medications. Patient x-rays negative. It appears much better on BiPAP.   According to last note on discharge patient had increasing burden from the pulmonary embolism. Patient also has a hematemesis which could be secondary to GI bleeding. We'll give pantoprazole. Appears that he has not had an endoscopy recently, unclear whether there is varices,  or with a history of alcoholic cirrhosis, will start octreotide.  According to patient's Joetta Manners, they were starting him on acute palliative care.   Patient is intermittently alert to person and place.    CRITICAL CARE Performed by: Arlana Hove Total critical care time: 60 minutes Critical care time was exclusive of separately billable procedures and treating other patients. Critical care was necessary to treat or prevent imminent or life-threatening deterioration. Critical care was time spent personally by me on the following activities: development of treatment plan with patient and/or surrogate as well as nursing, discussions with consultants, evaluation of patient's response to treatment, examination of patient, obtaining history from patient or surrogate, ordering and performing treatments and interventions, ordering and review of laboratory studies, ordering and review of radiographic studies, pulse oximetry and re-evaluation of patient's condition.   Final Clinical Impressions(s) / ED Diagnoses   Final diagnoses:  None    New Prescriptions New Prescriptions   No medications on file     Kayton Dunaj Randall An, MD 03/08/17 [phone]

## 2017-03-08 NOTE — ED Notes (Signed)
Portable chest x-ray at this time.

## 2017-03-08 NOTE — H&P (Signed)
History and Physical  Patient Name: Jeremy Herman     WUJ:[id number]    DOB: Mar 14, 1946    DOA: 03/08/2017 PCP: Lora Paula, MD   Patient coming from: [facility]  Chief Complaint: Hematemesis  HPI: Jeremy Herman is a 71 y.o. male with a past medical history significant for new diagnosis cirrhosis with ascites with liver failure and FTT, chronic PE with RHF and pHTN, recent ASD closure, and AFib on anticoagulation who presents with episode hematemesis.  Caveat that patient is unable to provide any independent history to me at this time.    Per chart, he was just admitted for dyspnea one week ago, found to have increased clot burden on CT (again) and subtherapeutic INR, was changed to Eliquis.  He did have an ileus during that hospitalization, was seen by [facility], appaers to have had a rectal tube briefly, but this resolved.  He has never had an endoscopy to screen for varices.  He was seen during the last hospitalization by Palliative Care as well given his substantial decline since Feb 2018 when his cirhosis was diagnosed and he had his ASD closed.  Palliative medicine saw him on 3/23 and 3/24 and he was made DNR.  He did have two paracenteses during his last hospitalization for a total of 6L fluid removed.  I spoke directly with nursing at his facility.  They report that he has been oriented and interactive but weak and listless since discharge one week ago.  Today, his PCP rounded and the patient complained of vague malaise, that he was feeling "worse" but had no other more specific complaints and an outpatient referral was made to Hospice.  Subsequently, around dinner, the patient was being fed by staff when he started to cough, and then coughed up a small amount of bright red blood.  A call was placed to his PCP who recommended he be transported to the ER.   ED course: -Heart rate 135, respirations 32, pulse oximetry 82%, blood pressure 140/96 -Na 141, K 4.0, Cr 2.91 (baseline  0.9), WBC 22.1K, Hgb 10.3 -INR 2.53 -Lipase normal -Lactic acid 2.43 -Troponin negative -CXR clear -ECG showed atrial fibrillation, RVR -He was initially awake and able to state to EDP, nursing and secretary that he wished no intubation, CPR but otherwise aggressive cares -He was given vancomycin and piperacillin-tazobactam, empiric PPI and octreotide, and 1L NS and TRH were asked to evaluate for hemoptysis and sepsis, unclear source     ROS: Review of Systems  Unable to perform ROS: Critical illness          Past Medical History:  Diagnosis Date  . Arthritis    "right knee" (01/17/2017)  . CHF (congestive heart failure) (HCC)   . Chronic anticoagulation   . COPD (chronic obstructive pulmonary disease) (HCC)   . DVT (deep venous thrombosis) (HCC) 2015; 2016   RLE; LLE  . Paroxysmal atrial fibrillation (HCC)    Hattie Perch 01/17/2017  . PE (pulmonary embolism) 2015; 2016  . Pneumonia 1969    Past Surgical History:  Procedure Laterality Date  . ANKLE FRACTURE SURGERY Right 1979   "skiing accident"  . ASD REPAIR  01/17/2017  . ATRIAL SEPTAL DEFECT(ASD) CLOSURE N/A 01/17/2017   Procedure: Atrial Septal Defect(ASD) Closure;  Surgeon: Tonny Bollman, MD;  Location: [facility] INVASIVE CV LAB;  Service: Cardiovascular;  Laterality: N/A;  . CARDIAC CATHETERIZATION  01/13/2017  . FEMUR IM NAIL Left 04/21/2016   Procedure: INTRAMEDULLARY (IM) NAIL LEFT  FEMORAL;  Surgeon: Samson Frederic, MD;  Location: [facility] ORS;  Service: Orthopedics;  Laterality: Left;  . FRACTURE SURGERY    . IR GENERIC HISTORICAL  02/20/2017   IR PARACENTESIS 02/20/2017 Gershon Crane, PA-C [facility]-INTERV RAD  . RIGHT/LEFT HEART CATH AND CORONARY ANGIOGRAPHY N/A 01/13/2017   Procedure: Right/Left Heart Cath and Coronary Angiography;  Surgeon: Laurey Morale, MD;  Location: [facility] INVASIVE CV LAB;  Service: Cardiovascular;  Laterality: N/A;  . TEE WITHOUT CARDIOVERSION N/A 01/13/2017   Procedure: TRANSESOPHAGEAL ECHOCARDIOGRAM  (TEE);  Surgeon: Laurey Morale, MD;  Location: [facility] ENDOSCOPY;  Service: Cardiovascular;  Laterality: N/A;    Social History: Patient lived alone previously, has been at [facility] in the last week.  The patient walked independently, per nursing he was ambulatory at the facility this week.  He smokes.    Allergies  Allergen Reactions  . Fish Allergy Anaphylaxis and Other (See Comments)    Diarrhea   . Ambien [Zolpidem Tartrate] Other (See Comments)    Family history: family history includes Aneurysm in his mother; Cancer in his mother; Deep vein thrombosis (age of onset: 91) in his brother; Stomach cancer in his father.  Prior to Admission medications   Medication Sig Start Date End Date Taking? Authorizing Provider  apixaban (ELIQUIS) 5 MG TABS tablet Take 1 tablet (5 mg total) by mouth 2 (two) times daily. 03/01/17  Yes Clydia Llano, MD  digoxin (LANOXIN) 0.125 MG tablet Take 1 tablet (0.125 mg total) by mouth daily. 03/01/17  Yes Clydia Llano, MD  folic acid (FOLVITE) 1 MG tablet Take 1 tablet (1 mg total) by mouth daily. 12/13/16  Yes Josalyn Funches, MD  furosemide (LASIX) 40 MG tablet Take 1 tablet (40 mg total) by mouth daily. 01/19/17  Yes [name], MD  lactulose (CHRONULAC) 10 GM/15ML solution Take 15 mLs (10 g total) by mouth 3 (three) times daily. 02/28/17  Yes Clydia Llano, MD  metoCLOPramide (REGLAN) 10 MG tablet Take 1 tablet (10 mg total) by mouth 3 (three) times daily before meals. 02/28/17  Yes Clydia Llano, MD  metoprolol tartrate (LOPRESSOR) 25 MG tablet Take 0.5 tablets (12.5 mg total) by mouth 2 (two) times daily. 02/28/17  Yes Clydia Llano, MD  midodrine (PROAMATINE) 10 MG tablet Take 1 tablet (10 mg total) by mouth 3 (three) times daily with meals. 01/18/17  Yes [name], MD  Multiple Vitamins-Minerals (MULTIVITAMIN) tablet Take 1 tablet by mouth daily. 12/13/16  Yes Josalyn Funches, MD  OVER THE COUNTER MEDICATION Take 120 mLs by mouth 2 (two) times daily. Med pass   Yes  Historical Provider, MD  rifaximin (XIFAXAN) 550 MG TABS tablet Take 1 tablet (550 mg total) by mouth 2 (two) times daily. 02/28/17  Yes Clydia Llano, MD  senna-docusate (SENOKOT-S) 8.6-50 MG tablet Take 1 tablet by mouth at bedtime as needed for mild constipation. 01/18/17  Yes [name], MD  spironolactone (ALDACTONE) 100 MG tablet Take 1 tablet (100 mg total) by mouth daily. 02/28/17  Yes Clydia Llano, MD  thiamine (VITAMIN B-1) 100 MG tablet Take 1 tablet (100 mg total) by mouth daily. 12/13/16  Yes Dessa Phi, MD       Physical Exam: BP (!) 142/96   Pulse (!) 27   Resp (!) 27   SpO2 94%  General appearance: Cachectic adult male, lethargic, does not open eyes to stimulation or respond to me.   Eyes: Anicteric, conjunctiva pink, lids and lashes normal. PERRL.    ENT: No nasal deformity, discharge, epistaxis.  OP dry.  On BiPAP. Neck: No neck masses.  Trachea midline. Lymph: No cervical or supraclavicular lymphadenopathy. Skin: Feet mottled, swollen.  Otherwise pale.  No jaundice.   Cardiac: Tachycardic, irregular.  No JVD.  Large LE edema.  Radial pulses 2+ and symmetric. Respiratory: Tachypneic, on BiPAP.  No rales. Abdomen: Abdomen distended, ascites present.   MSK: Cachectic, diffuse loss of muscle mass. Neuro: Does not open eyes to stimuli or respond to commands.  PERRL. Psych: Unable to assess.    Labs on Admission:  I have personally reviewed following labs and imaging studies: CBC:  Recent Labs Lab 03/08/17 1935  WBC 22.1*  NEUTROABS 19.6*  HGB 10.3*  HCT 29.4*  MCV 93.6  PLT 186   Basic Metabolic Panel:  Recent Labs Lab 03/08/17 1935  NA 141  K 4.0  CL 115*  CO2 15*  GLUCOSE 161*  BUN 68*  CREATININE 2.91*  CALCIUM 8.2*   GFR: Estimated Creatinine Clearance: 23.3 mL/min (A) (by C-G formula based on SCr of 2.91 mg/dL (H)).  Liver Function Tests:  Recent Labs Lab 03/08/17 1935  AST 43*  ALT 24  ALKPHOS 87  BILITOT 1.3*  PROT 6.5    ALBUMIN 2.3*    Recent Labs Lab 03/08/17 1935  LIPASE 18   No results for input(s): AMMONIA in the last 168 hours. Coagulation Profile:  Recent Labs Lab 03/08/17 1935  INR 2.53   Cardiac Enzymes: No results for input(s): CKTOTAL, CKMB, CKMBINDEX, TROPONINI in the last 168 hours. BNP (last 3 results) No results for input(s): PROBNP in the last 8760 hours. HbA1C: No results for input(s): HGBA1C in the last 72 hours. CBG: No results for input(s): GLUCAP in the last 168 hours. Lipid Profile: No results for input(s): CHOL, HDL, LDLCALC, TRIG, CHOLHDL, LDLDIRECT in the last 72 hours. Thyroid Function Tests: No results for input(s): TSH, T4TOTAL, FREET4, T3FREE, THYROIDAB in the last 72 hours. Anemia Panel: No results for input(s): VITAMINB12, FOLATE, FERRITIN, TIBC, IRON, RETICCTPCT in the last 72 hours. Sepsis Labs: Lactic acid 2.4 Invalid input(s): PROCALCITONIN, LACTICIDVEN No results found for this or any previous visit (from the past 240 hour(s)).       Radiological Exams on Admission: Personally reviewed CXR clear: Dg Chest Portable 1 View  Result Date: 03/08/2017 CLINICAL DATA:  Shortness of breath, hypoxia EXAM: PORTABLE CHEST 1 VIEW COMPARISON:  CTA chest dated 02/15/2017 FINDINGS: Mild linear scarring/atelectasis along the right mid lung. No focal consolidation No pleural effusion or pneumothorax. The heart is normal in size. IMPRESSION: No evidence of acute cardiopulmonary disease. Electronically Signed   By: Charline Bills M.D.   On: 03/08/2017 19:54    EKG: Independently reviewed. Rate 144, QTc nromal, atrial fibrillation.    Assessment/Plan  1. Hematemesis: Presenting complaint.  No previous endoscopy since 2012.  No known varices, none noted on CT abdomen last month.  Bleeding varices vs gastropathy vs hemoptysis/lung.  -Empiric octreotide and PPI -Hold anticoagulation for now -Discussed with oncall GI, other than above empiric treatments, treatment  of sepsis and supportive cares, no invasive treatments/endoscopies are likely to prolong life while meeting his goals of care   2. Sepsis unclear source:  Suspected source unclear. Organism unknown.   Patient meets criteria given tachycardia, tachypnea, leukocytosis, and evidence of organ dysfunction.  Lactate 2.43 mmol/L and repeat ordered within 6 hours.  This patient is at high risk of poor outcomes with a qSOFA score of 3.  Antibiotics delivered in the ED.    -  Sepsis bundle utilized:  -Blood culture ordered  -Small fluid bolus given, albumin bolus ordered, will repeat lactic acid  -Antibiotics: vancomycin and Zosyn  -If possible and has clinical improvement, will consider diagnostic paracentesis -Consult to palliative care      3. Acute kidney injury:  Likely ischemic and sepsis related.  Possibly hepatorenal syndrome. -Check FeNA -Check UA -Albumin 25g -Trend renal function  4. Cirrhosis with ascites:  Related to alcohol and chronic RHF.  End stage.  INR 2.4 off warfarin. -Hold diuretics for now -Albumin as above -Follow ammonia  5. Acute on chronic right heart failure and pHTN from chronic PE:  -Hold diuretics -Hold apixaban, consider stopping given INR 2.4, cirrhosis, coagulopathy, goals of care  6. Severe protein calorie malnutrition with failure to thrive:  -Consult Palliative Care  7. Atrial fibrillation with RVR:  CHADS2Vasc 4.   Dilt given in ER. -Continue metoprolol  8. Chronic hypotension:  -Continue midodrine     DVT prophylaxis: SCDs  Code Status: DO NOT RESUSCITATE  Family Communication: None present.  Has only brother, not reachable by phone  Disposition Plan: Anticipate IV fluids, empiric PPI and octreotide, empiric antibiotics.  Close monitoring overnight. Consults called: Discussed by Phone with GI on call, who agreed with above plan. Admission status: INPATIENT        Medical decision making: Patient seen at 9:10 PM on 03/08/2017.  The  patient was discussed with Dr. Evette Cristal and Dr. Juliann Pares.  What exists of the patient's chart was reviewed in depth and summarized above.  Clinical condition: stabilized at present, but menttation poor, still tachycardic, overall prognosis very grave, high likelihood of death imminently for this patient with end stage liver disease and heart failure from chronic PE.        Alberteen Sam [facility] Pager [phone]

## 2017-03-08 NOTE — ED Triage Notes (Addendum)
Patient arrived to  ED via [facility]. EMS reports:  Patient from [facility]. Staff reported to EMS that patient vomited blood approx 1500.[facility] witnessed blood tinged mucous. Patient has hard, distended abdomen. Reports this is not new, but has increased by 2 inches today to 23". BLEs have 3+ edema, weeping. Lungs clear. BM yesterday - normal.  Patient has DNR (signed 03/01/17) and MOST (signed 03/03/17). Reports lungs clear.  20 L AC. 94% on NRB.  Hx - CHF, COPD, long term use of Eliquis.

## 2017-03-08 NOTE — ED Notes (Signed)
Patient on Bipap

## 2017-03-08 NOTE — ED Notes (Signed)
PCP at bedside at this time.

## 2017-03-09 DIAGNOSIS — A419 Sepsis, unspecified organism: Principal | ICD-10-CM

## 2017-03-09 LAB — CBC
HEMATOCRIT: 25.6 % — AB (ref 39.0–52.0)
HEMOGLOBIN: 8.8 g/dL — AB (ref 13.0–17.0)
MCH: 32.2 pg (ref 26.0–34.0)
MCHC: 34.4 g/dL (ref 30.0–36.0)
MCV: 93.8 fL (ref 78.0–100.0)
Platelets: 145 10*3/uL — ABNORMAL LOW (ref 150–400)
RBC: 2.73 MIL/uL — AB (ref 4.22–5.81)
RDW: 17 % — ABNORMAL HIGH (ref 11.5–15.5)
WBC: 14.8 10*3/uL — ABNORMAL HIGH (ref 4.0–10.5)

## 2017-03-09 LAB — COMPREHENSIVE METABOLIC PANEL
ALBUMIN: 2.5 g/dL — AB (ref 3.5–5.0)
ALK PHOS: 72 U/L (ref 38–126)
ALT: 21 U/L (ref 17–63)
ANION GAP: 14 (ref 5–15)
AST: 35 U/L (ref 15–41)
BUN: 68 mg/dL — ABNORMAL HIGH (ref 6–20)
CALCIUM: 8.1 mg/dL — AB (ref 8.9–10.3)
CHLORIDE: 115 mmol/L — AB (ref 101–111)
CO2: 13 mmol/L — AB (ref 22–32)
Creatinine, Ser: 2.9 mg/dL — ABNORMAL HIGH (ref 0.61–1.24)
GFR calc non Af Amer: 21 mL/min — ABNORMAL LOW (ref >60)
GFR, EST AFRICAN AMERICAN: 24 mL/min — AB (ref >60)
GLUCOSE: 148 mg/dL — AB (ref 65–99)
Potassium: 3.9 mmol/L (ref 3.5–5.1)
Sodium: 142 mmol/L (ref 135–145)
Total Bilirubin: 1.6 mg/dL — ABNORMAL HIGH (ref 0.3–1.2)
Total Protein: 6.2 g/dL — ABNORMAL LOW (ref 6.5–8.1)

## 2017-03-09 LAB — PROTIME-INR
INR: 2.84
Prothrombin Time: 30.4 seconds — ABNORMAL HIGH (ref 11.4–15.2)

## 2017-03-09 LAB — LACTIC ACID, PLASMA
LACTIC ACID, VENOUS: 1.6 mmol/L (ref 0.5–1.9)
Lactic Acid, Venous: 1.5 mmol/L (ref 0.5–1.9)

## 2017-03-09 LAB — AMMONIA: Ammonia: 132 umol/L — ABNORMAL HIGH (ref 9–35)

## 2017-03-09 LAB — MRSA PCR SCREENING: MRSA BY PCR: NEGATIVE

## 2017-03-09 MED ORDER — DIGOXIN 125 MCG PO TABS: 0.1250 mg | ORAL_TABLET | Freq: Every day | ORAL | Status: DC

## 2017-03-09 MED ORDER — DIGOXIN 0.1 MG/ML IJ SOLN
120.0000 ug | Freq: Every day | INTRAMUSCULAR | Status: DC
  Administered 2017-03-09: 120 ug via INTRAVENOUS
  Filled 2017-03-09 (×4): qty 2

## 2017-03-09 MED ORDER — SODIUM CHLORIDE 0.9 % IV SOLN
INTRAVENOUS | Status: DC
  Administered 2017-03-09 – 2017-03-10 (×2): via INTRAVENOUS

## 2017-03-09 MED ORDER — METOPROLOL TARTRATE 5 MG/5ML IV SOLN
5.0000 mg | Freq: Four times a day (QID) | INTRAVENOUS | Status: DC
  Administered 2017-03-09 – 2017-03-10 (×2): 5 mg via INTRAVENOUS
  Filled 2017-03-09 (×2): qty 5

## 2017-03-09 MED ORDER — LACTULOSE ENEMA
300.0000 mL | Freq: Two times a day (BID) | ORAL | Status: DC
  Administered 2017-03-09 – 2017-03-11 (×5): 300 mL via RECTAL
  Filled 2017-03-09 (×7): qty 300

## 2017-03-09 MED ORDER — METOCLOPRAMIDE HCL 10 MG PO TABS: 10.0000 mg | ORAL_TABLET | Freq: Three times a day (TID) | ORAL | Status: DC

## 2017-03-09 MED ORDER — SODIUM CHLORIDE 0.9 % IV BOLUS (SEPSIS)
500.0000 mL | Freq: Once | INTRAVENOUS | Status: AC
  Administered 2017-03-09: 500 mL via INTRAVENOUS

## 2017-03-09 MED ORDER — LORAZEPAM 2 MG/ML IJ SOLN: 0.5000 mg | Freq: Four times a day (QID) | INTRAMUSCULAR | Status: DC | PRN

## 2017-03-09 NOTE — Progress Notes (Signed)
PROGRESS NOTE    Jeremy Herman  OVZ:[id number] DOB: [birth year] DOA: 03/08/2017 PCP: Minerva Ends, MD  Outpatient Specialists:     Brief Narrative:  17 Prior cavitary lung lesions 2012 ASD repair 01/17/17  Atrioseptal defect noted in 12/2010 likely cause of pulmonary hypertension/? CvC liver Atrial fibrillation/SVT in setting up a hospitalization 2012 Liver cirrhosis probably at least secondary to elevated right heart shows with chronic venous congestion liver causing the same DVT PE diagnosed in 08/2013 on Coumadin HLD Tobacco abuse Complex right kidney cyst Microcytic anemia Right hip fracture status post repair 09/2015, left hip fracture repaired 03/2016  Admit 3/21-4/3 progressive shortness of breath worsening abdominal distention-CT angiogram chest showed interval clot burden-on that admission found to have ileus, AK I and found to have acute hepatic encephalopathy as well and atrial fibrillation  Patient saw palliative care last admission-was being referred to hospice as an out agent but seemed to be more listless and he apparently vomited some blood after being fed and came to the emergency room  Tachycardic 130s INR 2.5 lactic S2 0.4 atrial fibrillation and started on broad-spectrum antibiotic empiric PPI and octreotide and being treated for sepsis   Assessment & Plan:   Principal Problem:   Sepsis ([place]) Active Problems:   Bilateral pulmonary embolism (HCC)   Chronic hypotension   Pulmonary hypertension   Chronic pulmonary embolism (HCC)   Ascites due to alcoholic cirrhosis ([place])   Atrial fibrillation with RVR ([place])   Protein-calorie malnutrition, severe   Palliative care encounter   Acute renal failure (ARF) ([place])  Toxic encephalopathy-?Hepatic vs infection  Treating for sepsis + Hepatic cause-Lactulose enema till mentation clears.  Ammonia 132 on admit.  Rpt in am.    Hematemesis  Admitting M.D. discussed with GI. Not candidate for aggressive measures or  endoscopy at this stage. DDX leading varices versus gastropathy or even hemetemesis. Continue empiric octreotide and PPI for now but low threshold to d/c if no other evidence bleeding  Holding anticoagulation  Acute hypoxic respiratory failure  Hypoxic on admit.  Was on Bipap-d/c and seems to sat at 100%.  Monitor on SDU and resume if prn  Possible Sepsis ? Cause-possible urinary vs SBP at admission  Sepsis bundle, blood culture pending, continue vancomycin and Zosyn-palliative care to be involved. High risk for poor outcomes. Lactic acid 2.4 on admission  AK I with met acidosis 2/2 to Obstructive uropathy  Baseline creatinine 0.9 on 02/26/2017-->2.9 -Follow urine analysis, feNA-given albumin this admission. Note was on Lasix 40 daily/Aldactone 100 daily as outpatient.  Bladder scan bedside [condom cath placed in ed] showed 999 urine.  Place foley.  Anticipate significant improvement of creatinine with foley placement.  Start Flomax when awake  Atrial fibrillation [place] score 4 with RVR. Off of Coumadin.  INR 2.4. Held metoprolol 12.5 twice a day as sleepy.  Iv metoprollol 5 mg q6 for HR sustaining above 120. Given Cardizem 10/in ED. Given digoxin 0.125 on admission--changed to IV 125 MCG for now-not best choice given renal/liver issue-will reassess need  Acute on chronic right heart failure  Holding diuretics given rising creatinine-see above discussion  Cirrhosis query cause probably CBC liver  Considering paracentesis once more stable.  Cont Rifaxamin when able.  Needs GOC    SCD No family Inpatient SDU Poor overall prognosis  Consultants:   Gastroenterology Sadie Haber was consulted on admission  Procedures:     Antimicrobials:   Vancomycin and Zosyn    Subjective:  Sleepy and not making sense when  off of bipap Tremulous ++ Cannot orient clearly-RN feels not safe to eat  Objective: Vitals:   03/09/17 0015 03/09/17 0325 03/09/17 0326 03/09/17 0700  BP: 101/72 125/82   99/63  Pulse: 85 (!) 38  (!) 49  Resp: (!) 21 18  15   Temp: 98 F (36.7 C) 97.9 F (36.6 C) 97.9 F (36.6 C) 97.8 F (36.6 C)  TempSrc: Axillary Axillary Axillary Axillary  SpO2: 96% 96%  97%    Intake/Output Summary (Last 24 hours) at 03/09/17 0825 Last data filed at 03/09/17 0700  Gross per 24 hour  Intake             1050 ml  Output                0 ml  Net             1050 ml   There were no vitals filed for this visit.  Examination:  General exam: sleepy confused Respiratory system: Clear to auscultation.  Cardiovascular system: S1 & S2 heard-tachycardic-seems reg. No JVD, murmurs. + pedal edema but no LE edema Gastrointestinal system: distended abd-shift dullness.  Flanks are full Central nervous system: confused.  Moving 4 limbs.  Neuro exam not completely possible Extremities: Symmetric 5 x 5 power. Skin: No rashes, lesions or ulcers Psychiatry: see above    Data Reviewed: I have personally reviewed following labs and imaging studies  CBC:  Recent Labs Lab 03/08/17 1935 03/09/17 0545  WBC 22.1* 14.8*  NEUTROABS 19.6*  --   HGB 10.3* 8.8*  HCT 29.4* 25.6*  MCV 93.6 93.8  PLT 186 465*   Basic Metabolic Panel:  Recent Labs Lab 03/08/17 1935 03/09/17 0545  NA 141 142  K 4.0 3.9  CL 115* 115*  CO2 15* 13*  GLUCOSE 161* 148*  BUN 68* 68*  CREATININE 2.91* 2.90*  CALCIUM 8.2* 8.1*   GFR: Estimated Creatinine Clearance: 23.4 mL/min (A) (by C-G formula based on SCr of 2.9 mg/dL (H)). Liver Function Tests:  Recent Labs Lab 03/08/17 1935 03/09/17 0545  AST 43* 35  ALT 24 21  ALKPHOS 87 72  BILITOT 1.3* 1.6*  PROT 6.5 6.2*  ALBUMIN 2.3* 2.5*    Recent Labs Lab 03/08/17 1935  LIPASE 18    Recent Labs Lab 03/08/17 2315  AMMONIA 132*   Coagulation Profile:  Recent Labs Lab 03/08/17 1935 03/09/17 0545  INR 2.53 2.84   Cardiac Enzymes: No results for input(s): CKTOTAL, CKMB, CKMBINDEX, TROPONINI in the last 168 hours. BNP  (last 3 results) No results for input(s): PROBNP in the last 8760 hours. HbA1C: No results for input(s): HGBA1C in the last 72 hours. CBG: No results for input(s): GLUCAP in the last 168 hours. Lipid Profile: No results for input(s): CHOL, HDL, LDLCALC, TRIG, CHOLHDL, LDLDIRECT in the last 72 hours. Thyroid Function Tests: No results for input(s): TSH, T4TOTAL, FREET4, T3FREE, THYROIDAB in the last 72 hours. Anemia Panel: No results for input(s): VITAMINB12, FOLATE, FERRITIN, TIBC, IRON, RETICCTPCT in the last 72 hours. Urine analysis:    Component Value Date/Time   COLORURINE AMBER (A) 02/15/2017 2233   APPEARANCEUR HAZY (A) 02/15/2017 2233   LABSPEC 1.020 02/15/2017 2233   PHURINE 5.0 02/15/2017 2233   GLUCOSEU NEGATIVE 02/15/2017 2233   HGBUR NEGATIVE 02/15/2017 2233   BILIRUBINUR SMALL (A) 02/15/2017 2233   KETONESUR NEGATIVE 02/15/2017 2233   PROTEINUR 30 (A) 02/15/2017 2233   UROBILINOGEN 1.0 12/26/2010 0531   NITRITE NEGATIVE 02/15/2017 2233  LEUKOCYTESUR NEGATIVE 02/15/2017 2233   Sepsis Labs: @LABRCNTIP (procalcitonin:4,lacticidven:4)  )No results found for this or any previous visit (from the past 240 hour(s)).       Radiology Studies: Dg Chest Portable 1 View  Result Date: 03/08/2017 CLINICAL DATA:  Shortness of breath, hypoxia EXAM: PORTABLE CHEST 1 VIEW COMPARISON:  CTA chest dated 02/15/2017 FINDINGS: Mild linear scarring/atelectasis along the right mid lung. No focal consolidation No pleural effusion or pneumothorax. The heart is normal in size. IMPRESSION: No evidence of acute cardiopulmonary disease. Electronically Signed   By: Julian Hy M.D.   On: 03/08/2017 19:54     Scheduled Meds: . digoxin  120 mcg Intravenous Daily  . lactulose  10 g Oral TID  . lactulose  300 mL Rectal BID  . metoprolol  5 mg Intravenous Q6H  . piperacillin-tazobactam (ZOSYN)  IV  3.375 g Intravenous Q8H  . rifaximin  550 mg Oral BID   Continuous Infusions: .  octreotide  (SANDOSTATIN)    IV infusion 50 mcg/hr (03/09/17 0328)     LOS: 1 day    Time spent: [place], MD [facility] ([facility]   If 7PM-7AM, please contact night-coverage [email or web address] Password [email or web address] 03/09/2017, 8:25 AM

## 2017-03-09 NOTE — Progress Notes (Signed)
Patient transported from ED to 4E04 without any complications

## 2017-03-09 NOTE — Progress Notes (Signed)
bolusing for low map 500 cc Will increase fluid rate to 100 cc/hr Reassess if worse but is dnar

## 2017-03-10 ENCOUNTER — Inpatient Hospital Stay (HOSPITAL_COMMUNITY): Payer: Medicare Other

## 2017-03-10 DIAGNOSIS — K7290 Hepatic failure, unspecified without coma: Secondary | ICD-10-CM

## 2017-03-10 DIAGNOSIS — K7682 Hepatic encephalopathy: Secondary | ICD-10-CM

## 2017-03-10 LAB — COMPREHENSIVE METABOLIC PANEL
ALT: 21 U/L (ref 17–63)
ANION GAP: 11 (ref 5–15)
AST: 43 U/L — ABNORMAL HIGH (ref 15–41)
Albumin: 2.3 g/dL — ABNORMAL LOW (ref 3.5–5.0)
Alkaline Phosphatase: 73 U/L (ref 38–126)
BUN: 68 mg/dL — ABNORMAL HIGH (ref 6–20)
CALCIUM: 7.7 mg/dL — AB (ref 8.9–10.3)
CHLORIDE: 121 mmol/L — AB (ref 101–111)
CO2: 12 mmol/L — AB (ref 22–32)
CREATININE: 2.92 mg/dL — AB (ref 0.61–1.24)
GFR, EST AFRICAN AMERICAN: 24 mL/min — AB (ref >60)
GFR, EST NON AFRICAN AMERICAN: 20 mL/min — AB (ref >60)
Glucose, Bld: 126 mg/dL — ABNORMAL HIGH (ref 65–99)
Potassium: 3.8 mmol/L (ref 3.5–5.1)
SODIUM: 144 mmol/L (ref 135–145)
Total Bilirubin: 1.4 mg/dL — ABNORMAL HIGH (ref 0.3–1.2)
Total Protein: 6.2 g/dL — ABNORMAL LOW (ref 6.5–8.1)

## 2017-03-10 LAB — BLOOD CULTURE ID PANEL (REFLEXED)
Acinetobacter baumannii: NOT DETECTED
CANDIDA GLABRATA: NOT DETECTED
CANDIDA PARAPSILOSIS: NOT DETECTED
CANDIDA TROPICALIS: NOT DETECTED
Candida albicans: NOT DETECTED
Candida krusei: NOT DETECTED
ENTEROBACTER CLOACAE COMPLEX: NOT DETECTED
Enterobacteriaceae species: NOT DETECTED
Enterococcus species: NOT DETECTED
Escherichia coli: NOT DETECTED
Haemophilus influenzae: NOT DETECTED
KLEBSIELLA PNEUMONIAE: NOT DETECTED
Klebsiella oxytoca: NOT DETECTED
Listeria monocytogenes: NOT DETECTED
Methicillin resistance: DETECTED — AB
Neisseria meningitidis: NOT DETECTED
PROTEUS SPECIES: NOT DETECTED
Pseudomonas aeruginosa: NOT DETECTED
STAPHYLOCOCCUS SPECIES: DETECTED — AB
STREPTOCOCCUS SPECIES: NOT DETECTED
Serratia marcescens: NOT DETECTED
Staphylococcus aureus (BCID): NOT DETECTED
Streptococcus agalactiae: NOT DETECTED
Streptococcus pneumoniae: NOT DETECTED
Streptococcus pyogenes: NOT DETECTED

## 2017-03-10 LAB — CBC
HCT: 26.8 % — ABNORMAL LOW (ref 39.0–52.0)
Hemoglobin: 9.3 g/dL — ABNORMAL LOW (ref 13.0–17.0)
MCH: 33.3 pg (ref 26.0–34.0)
MCHC: 34.7 g/dL (ref 30.0–36.0)
MCV: 96.1 fL (ref 78.0–100.0)
PLATELETS: 138 10*3/uL — AB (ref 150–400)
RBC: 2.79 MIL/uL — AB (ref 4.22–5.81)
RDW: 17.4 % — AB (ref 11.5–15.5)
WBC: 14.6 10*3/uL — ABNORMAL HIGH (ref 4.0–10.5)

## 2017-03-10 LAB — PROTIME-INR
INR: 2.44
PROTHROMBIN TIME: 26.9 s — AB (ref 11.4–15.2)

## 2017-03-10 MED ORDER — DIGOXIN 0.1 MG/ML IJ SOLN: 125.0000 ug | Freq: Once | INTRAMUSCULAR | Status: DC

## 2017-03-10 MED ORDER — DIGOXIN 0.25 MG/ML IJ SOLN
0.1250 mg | Freq: Once | INTRAMUSCULAR | Status: AC
  Administered 2017-03-10: 0.125 mg via INTRAVENOUS
  Filled 2017-03-10: qty 2

## 2017-03-10 MED ORDER — VANCOMYCIN HCL IN DEXTROSE 1-5 GM/200ML-% IV SOLN
1000.0000 mg | INTRAVENOUS | Status: DC
  Administered 2017-03-10: 1000 mg via INTRAVENOUS
  Filled 2017-03-10: qty 200

## 2017-03-10 MED ORDER — ORAL CARE MOUTH RINSE
15.0000 mL | Freq: Two times a day (BID) | OROMUCOSAL | Status: DC
  Administered 2017-03-11 (×2): 15 mL via OROMUCOSAL

## 2017-03-10 MED ORDER — DIGOXIN 0.25 MG/ML IJ SOLN
0.2500 mg | Freq: Every day | INTRAMUSCULAR | Status: DC
  Administered 2017-03-11: 0.25 mg via INTRAVENOUS
  Filled 2017-03-10: qty 2

## 2017-03-10 MED ORDER — DIGOXIN 0.25 MG/ML IJ SOLN
125.0000 ug | Freq: Every day | INTRAMUSCULAR | Status: DC
  Administered 2017-03-10: 125 ug via INTRAVENOUS
  Filled 2017-03-10: qty 2

## 2017-03-10 NOTE — Care Management Note (Signed)
Case Management Note  Patient Details  Name: Jeremy Herman MRN: [id number] Date of Birth: [birth year]  Subjective/Objective:   From [facility],   Presents with sepsis, bil pe, ascites secondary to etoh cirrhosis, afib with rvr, arf,              Action/Plan: NCM will cont to follow patient's progression.  Expected Discharge Date:                  Expected Discharge Plan:  Skilled Nursing Facility  In-House Referral:  Clinical Social Work  Discharge planning Services  CM Consult  Post Acute Care Choice:    Choice offered to:     DME Arranged:    DME Agency:     HH Arranged:    HH Agency:     Status of Service:  Completed, signed off  If discussed at [employer], dates discussed:    Additional Comments:  Leone Haven, RN 03/10/2017, 5:46 PM

## 2017-03-10 NOTE — Progress Notes (Signed)
PHARMACY - PHYSICIAN COMMUNICATION CRITICAL VALUE ALERT - BLOOD CULTURE IDENTIFICATION (BCID)  Results for orders placed or performed during the hospital encounter of 03/08/17  Blood Culture ID Panel (Reflexed) (Collected: 03/08/2017 11:10 PM)  Result Value Ref Range   Enterococcus species NOT DETECTED NOT DETECTED   Listeria monocytogenes NOT DETECTED NOT DETECTED   Staphylococcus species DETECTED (A) NOT DETECTED   Staphylococcus aureus NOT DETECTED NOT DETECTED   Methicillin resistance DETECTED (A) NOT DETECTED   Streptococcus species NOT DETECTED NOT DETECTED   Streptococcus agalactiae NOT DETECTED NOT DETECTED   Streptococcus pneumoniae NOT DETECTED NOT DETECTED   Streptococcus pyogenes NOT DETECTED NOT DETECTED   Acinetobacter baumannii NOT DETECTED NOT DETECTED   Enterobacteriaceae species NOT DETECTED NOT DETECTED   Enterobacter cloacae complex NOT DETECTED NOT DETECTED   Escherichia coli NOT DETECTED NOT DETECTED   Klebsiella oxytoca NOT DETECTED NOT DETECTED   Klebsiella pneumoniae NOT DETECTED NOT DETECTED   Proteus species NOT DETECTED NOT DETECTED   Serratia marcescens NOT DETECTED NOT DETECTED   Haemophilus influenzae NOT DETECTED NOT DETECTED   Neisseria meningitidis NOT DETECTED NOT DETECTED   Pseudomonas aeruginosa NOT DETECTED NOT DETECTED   Candida albicans NOT DETECTED NOT DETECTED   Candida glabrata NOT DETECTED NOT DETECTED   Candida krusei NOT DETECTED NOT DETECTED   Candida parapsilosis NOT DETECTED NOT DETECTED   Candida tropicalis NOT DETECTED NOT DETECTED    Name of physician (or Provider) Contacted: Dr. Suzzanne Cloud   Changes to prescribed antibiotics required:  already on vancomycin and Zosyn- awaiting plan with palliative care before de-escalation done   Krystle Oberman D. Winslow Ederer, PharmD, BCPS Clinical Pharmacist Pager: [phone] 03/10/2017 5:46 PM

## 2017-03-10 NOTE — Progress Notes (Addendum)
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 CvC liver Atrial fibrillation/SVT in setting up a hospitalization 2012 Liver cirrhosis probably at least secondary to elevated right heart shows with chronic venous congestion liver causing the same DVT PE diagnosed in 08/2013 on Coumadin HLD Tobacco abuse Complex right kidney cyst Microcytic anemia Right hip fracture status post repair 09/2015, left hip fracture repaired 03/2016  Admit 3/21-4/3 progressive shortness of breath worsening abdominal distention-CT angiogram chest showed interval clot burden-on that admission found to have ileus, AK I and found to have acute hepatic encephalopathy as well and atrial fibrillation  Patient saw palliative care last admission-was being referred to hospice as an out agent but seemed to be more listless and he apparently vomited some blood after being fed and came to the emergency room  Tachycardic 130s INR 2.5 lactic S2 0.4 atrial fibrillation and started on broad-spectrum antibiotic empiric PPI and octreotide and being treated for sepsis   Assessment & Plan:   Principal Problem:   Sepsis ([place]) Active Problems:   Bilateral pulmonary embolism (HCC)   Chronic hypotension   Pulmonary hypertension   Chronic pulmonary embolism (HCC)   Ascites due to alcoholic cirrhosis ([place])   Atrial fibrillation with RVR ([place])   Protein-calorie malnutrition, severe   Palliative care encounter   Acute renal failure (ARF) ([place])  Toxic encephalopathy-?Hepatic vs infection  Treating for sepsis + Hepatic cause-Lactulose enema till mentation clears.  Ammonia 132 on admit.  Rpt in am.    Hematemesis  Admitting M.D. discussed with GI. Not candidate for aggressive measures or  endoscopy at this stage. DDX leading varices versus gastropathy or even hemetemesis. D/c octreotide and PPI   Holding anticoagulation  Acute hypoxic respiratory failure  Hypoxic on admit which was not sustained.  Was on Bipap-d/c and seems to sat at 100%  Possible Sepsis ? Cause-possible urinary vs SBP at admission  Sepsis bundle, blood culture pending, continue vancomycin and Zosyn-palliative care to be involved--await formal input. High risk for poor outcomes. Lactic acid 2.4 on admission  AK I with met acidosis 2/2 to Obstructive uropathy  Baseline creatinine 0.9 on 02/26/2017-->2.9 -Follow urine analysis, feNA-given albumin this admission. Note was on Lasix 40 daily/Aldactone 100 daily as outpatient.  Bladder scan bedside [condom cath placed in ed] showed 999 urine.  Place foley and keep indwelling.  no significant improvement of creatinine with foley placement.  Start Flomax when awake  Atrial fibrillation [place] score 4 with RVR. Off of Coumadin.  INR 2.4. Held metoprolol 12.5 twice a day as sleepy.  Holding Iv metoprollol 5 mg q6 as hypotnsive 90 systolic Given Cardizem 39/QZ ED. Given digoxin 0.125 on admission--changed to IV 125 MCG for now- and increased for total dose of 250 mcg from 4/14 am.  Acute on chronic right heart failure  Holding diuretics given rising creatinine-see above discussion  Cirrhosis query cause probably CBC liver  Considering paracentesis once more stable.  Cont Rifaxamin when able.  Needs GOC    SCD No family-called neighbour who has taken care of him.  Reviewed Ms Franchot Mimes notes from last admission consult note and patient had expressed at that time he would want his ex-wife and brother involved [not close with brother].  Neighbour did not have correct phone number for  ex-wife nor does he have contact info for brother Have asked forguidance in terms of Goals of Care and surrogate medical deicsion making from pallaitive care appreciate input in advance Inpatient  SDU Poor overall prognosis  Consultants:   Gastroenterology Sadie Haber was consulted on admission  Procedures:     Antimicrobials:   Vancomycin and Zosyn    Subjective:  More awake, not alert-doing poorly overall and cannot orient [name] and unable to clearly delineate further wishes Nursing reports hypotension-2 large stools with enemas Pulling at tubes and lines  Objective: Vitals:   03/10/17 0225 03/10/17 0338 03/10/17 0700 03/10/17 1200  BP: (!) 97/57 (!) 94/59 94/63 95/75   Pulse: (!) 116 (!) 117 (!) 108 (!) 119  Resp: 18 19 15  (!) 22  Temp:  97.9 F (36.6 C) 98.5 F (36.9 C) 97.8 F (36.6 C)  TempSrc:  Axillary Oral Oral  SpO2: 100% 100% 100% 100%  Weight:      Height:        Intake/Output Summary (Last 24 hours) at 03/10/17 1522 Last data filed at 03/10/17 1232  Gross per 24 hour  Intake          2100.83 ml  Output              600 ml  Net          1500.83 ml   Filed Weights   03/09/17 1617  Weight: 68.4 kg (150 lb 12.7 oz)    Examination:  General exam: sleepy confused Respiratory system: Clear to auscultation.  Cardiovascular system: S1 & S2 heard-tachycardic-seems reg. No JVD, murmurs. + pedal edema but no LE edema Gastrointestinal system: distended abd-shift dullness.  Flanks are full Central nervous system: confused.  Moving 4 limbs.  Neuro exam not completely possible-cannot orient but is more awake    Data Reviewed: I have personally reviewed following labs and imaging studies  CBC:  Recent Labs Lab 03/08/17 1935 03/09/17 0545 03/10/17 0207  WBC 22.1* 14.8* 14.6*  NEUTROABS 19.6*  --   --   HGB 10.3* 8.8* 9.3*  HCT 29.4* 25.6* 26.8*  MCV 93.6 93.8 96.1  PLT 186 145* 185*   Basic Metabolic Panel:  Recent Labs Lab 03/08/17 1935 03/09/17 0545 03/10/17 0207  NA 141 142 144  K 4.0 3.9 3.8  CL 115* 115* 121*  CO2 15* 13* 12*  GLUCOSE 161* 148* 126*  BUN 68* 68* 68*  CREATININE 2.91* 2.90* 2.92*  CALCIUM  8.2* 8.1* 7.7*   GFR: Estimated Creatinine Clearance: 23.1 mL/min (A) (by C-G formula based on SCr of 2.92 mg/dL (H)). Liver Function Tests:  Recent Labs Lab 03/08/17 1935 03/09/17 0545 03/10/17 0207  AST 43* 35 43*  ALT 24 21 21   ALKPHOS 87 72 73  BILITOT 1.3* 1.6* 1.4*  PROT 6.5 6.2* 6.2*  ALBUMIN 2.3* 2.5* 2.3*    Recent Labs Lab 03/08/17 1935  LIPASE 18    Recent Labs Lab 03/08/17 2315  AMMONIA 132*   Coagulation Profile:  Recent Labs Lab 03/08/17 1935 03/09/17 0545 03/10/17 0207  INR 2.53 2.84 2.44   Cardiac Enzymes: No results for input(s): CKTOTAL, CKMB, CKMBINDEX, TROPONINI in the last 168 hours. BNP (last 3 results) No results for input(s): PROBNP in the last 8760 hours. HbA1C: No results for input(s): HGBA1C in the last 72 hours. CBG: No results for input(s): GLUCAP in the last 168 hours. Lipid Profile: No results for input(s): CHOL, HDL, LDLCALC, TRIG, CHOLHDL, LDLDIRECT in the last 72  hours. Thyroid Function Tests: No results for input(s): TSH, T4TOTAL, FREET4, T3FREE, THYROIDAB in the last 72 hours. Anemia Panel: No results for input(s): VITAMINB12, FOLATE, FERRITIN, TIBC, IRON, RETICCTPCT in the last 72 hours. Urine analysis:    Component Value Date/Time   COLORURINE AMBER (A) 02/15/2017 2233   APPEARANCEUR HAZY (A) 02/15/2017 2233   LABSPEC 1.020 02/15/2017 2233   PHURINE 5.0 02/15/2017 2233   GLUCOSEU NEGATIVE 02/15/2017 2233   HGBUR NEGATIVE 02/15/2017 2233   BILIRUBINUR SMALL (A) 02/15/2017 2233   KETONESUR NEGATIVE 02/15/2017 2233   PROTEINUR 30 (A) 02/15/2017 2233   UROBILINOGEN 1.0 12/26/2010 0531   NITRITE NEGATIVE 02/15/2017 2233   LEUKOCYTESUR NEGATIVE 02/15/2017 2233   Sepsis Labs: @LABRCNTIP (procalcitonin:4,lacticidven:4)  ) Recent Results (from the past 240 hour(s))  Culture, blood (single)     Status: None (Preliminary result)   Collection Time: 03/08/17 11:10 PM  Result Value Ref Range Status   Specimen  Description BLOOD RIGHT HAND  Final   Special Requests IN PEDIATRIC BOTTLE BCAV  Final   Culture  Setup Time   Final    GRAM POSITIVE COCCI IN CLUSTERS IN PEDIATRIC BOTTLE Organism ID to follow    Culture NO GROWTH 2 DAYS  Final   Report Status PENDING  Incomplete  MRSA PCR Screening     Status: None   Collection Time: 03/09/17  5:57 AM  Result Value Ref Range Status   MRSA by PCR NEGATIVE NEGATIVE Final    Comment:        The GeneXpert MRSA Assay (FDA approved for NASAL specimens only), is one component of a comprehensive MRSA colonization surveillance program. It is not intended to diagnose MRSA infection nor to guide or monitor treatment for MRSA infections.          Radiology Studies: Dg Chest Port 1 View  Result Date: 03/10/2017 CLINICAL DATA:  Sepsis, COPD, CHF EXAM: PORTABLE CHEST 1 VIEW COMPARISON:  03/08/2017 FINDINGS: Mild vascular congestion. Linear areas of atelectasis or scarring in the lungs bilaterally. Mild interstitial prominence is increased since prior study could reflect interstitial edema. Heart is upper limits normal in size. No effusions. IMPRESSION: Mild vascular congestion with increasing interstitial prominence, question interstitial edema. Electronically Signed   By: Rolm Baptise M.D.   On: 03/10/2017 08:11   Dg Chest Portable 1 View  Result Date: 03/08/2017 CLINICAL DATA:  Shortness of breath, hypoxia EXAM: PORTABLE CHEST 1 VIEW COMPARISON:  CTA chest dated 02/15/2017 FINDINGS: Mild linear scarring/atelectasis along the right mid lung. No focal consolidation No pleural effusion or pneumothorax. The heart is normal in size. IMPRESSION: No evidence of acute cardiopulmonary disease. Electronically Signed   By: Julian Hy M.D.   On: 03/08/2017 19:54     Scheduled Meds: . [START ON 03/11/2017] digoxin  0.25 mg Intravenous Daily  . lactulose  10 g Oral TID  . lactulose  300 mL Rectal BID  . piperacillin-tazobactam (ZOSYN)  IV  3.375 g  Intravenous Q8H  . rifaximin  550 mg Oral BID  . vancomycin  1,000 mg Intravenous Q48H   Continuous Infusions: . sodium chloride 100 mL/hr at 03/10/17 0415  . octreotide  (SANDOSTATIN)    IV infusion 50 mcg/hr (03/10/17 0226)     LOS: 2 days    Time spent: [place], MD [facility]   If 7PM-7AM, please contact night-coverage [email or web address] Password [facility] 03/10/2017, 3:22 PM

## 2017-03-10 NOTE — Consult Note (Signed)
Consultation Note Date: 03/10/2017   Patient Name: Jeremy Herman  DOB: 1946/02/26  MRN: [id number]  Age / Sex: 71 y.o., male  PCP: Dessa Phi, MD Referring Physician: Rhetta Mura, MD  Reason for Consultation: Establishing goals of care  HPI/Patient Profile: 71 y.o. male  with past medical history of right sided HF with pulmonary HTN, p-Afib, PE/DVT on eliquis, and cirrhosis, who was admitted on 03/08/2017 with hematemesis, hepatic encephalopathy and acute renal failure.  He had a recent hospitalization with ASD closure (2/18) and encephalopathy.  He was found to have cirrhosis. Subsequently he was admitted with shortness of breath and found to have increased PE clot burden.  He also had an ileus.  He was living independently in an apartment but for the past week was staying at [facility] SN[facility].  His admission H&P indicates and outpatient referral was made to Hospice from [facility].  Clinical Assessment and Goals of Care: I initially saw Jeremy Herman on 4/12.  He was tremulous and on Bipap.  He did not open his eyes or communicate.  I saw him again today.  He is off bipap and will give 1 word yes/no answers.  I'm uncertain if his answers are accurate.  Due to his inability to communicate - I reached out to both of the contacts on his face sheet.  Jeremy Herman, who is now Jeremy Herman is Jeremy Herman's ex-wife.  I have been unable to reach her.  The other contact Jeremy Herman is a friend who used to live next door to Jeremy Herman.  Jeremy Herman tells me Jeremy Herman has a brother and he was hopeful that Jeremy Herman could give [place] Jeremy Herman's brother's contact information.  Jeremy Herman will email Tresa Endo asking her to call the hospital.   In short, at this point - Jeremy Herman is unable to make decisions for himself and there is no one to do it on his behalf.  I am uncertain who at [facility] made decisions for Jeremy Herman or  if he made his own decisions.  Currently Jeremy Herman is somewhat stable.  He is off bipap, breathing with no distress.  He is encephalopathic, no eating and making very little urine despite receiving IVF.  His abdominal girth appears to be increasing.  If he remains encephalopathic despite lactulose enemas, then he is most certainly Hospice House appropriate.  Primary Decision Maker:  None.    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  An outpatient Hospice referral had been made prior to admission.  At this point he is hospice house eligible if his encephalopathy doesn't clear.  Transition to hospice house could take place by (1) requesting a guardian thru DSS or (2) by having 2 MDs agree and sign that this is the best course of action for this patient.  In order for the patient to be sent to [facility] from [facility] on the order of 2 MDs, Jeremy Herman would need to agree to accept  the body back for disposition after Jeremy Herman dies.  Very rarely this has been done in the past via social work.  It would be most optimal if treatment with lactulose enema's was effective and the encephalopathy cleared - Jeremy Herman could make his own decisions. Otherwise it would be best if we could find his brother to make decisions for him.  Code Status/Advance Care Planning:  DNR    Symptom Management:   Lactulose enemas  Prognosis:   < 2 weeks given advanced right sided heart failure, ESLD with encephalopathy and Acute renal failure with oliguria.  Discharge Planning: To Be Determined Likely hospice house      Primary Diagnoses: Present on Admission: . Sepsis (HCC) . Pulmonary hypertension . Bilateral pulmonary embolism (HCC) . Chronic hypotension . Chronic pulmonary embolism (HCC) . Ascites due to alcoholic cirrhosis (HCC) . Atrial fibrillation with RVR (HCC) . Protein-calorie malnutrition, severe . Acute renal  failure (ARF) (HCC)   I have reviewed the medical record, interviewed the patient and family, and examined the patient. The following aspects are pertinent.  Past Medical History:  Diagnosis Date  . Arthritis    "right knee" (01/17/2017)  . CHF (congestive heart failure) (HCC)   . Chronic anticoagulation   . COPD (chronic obstructive pulmonary disease) (HCC)   . DVT (deep venous thrombosis) (HCC) 2015; 2016   RLE; LLE  . Paroxysmal atrial fibrillation (HCC)    Hattie Perch 01/17/2017  . PE (pulmonary embolism) 2015; 2016  . Pneumonia 1969   Social History   Social History  . Marital status: Divorced    Spouse name: N/A  . Number of children: N/A  . Years of education: N/A   Social History Main Topics  . Smoking status: Current Every Day Smoker    Packs/day: 1.00    Years: 48.00    Types: Cigarettes  . Smokeless tobacco: Never Used  . Alcohol use No     Comment: Former extensive use; "Quit  in 2001" (01/17/2017)  . Drug use: No  . Sexual activity: Not Currently   Other Topics Concern  . None   Social History Narrative  . None   Family History  Problem Relation Age of Onset  . Stomach cancer Father   . Cancer Mother   . Aneurysm Mother   . Deep vein thrombosis Brother 67   Scheduled Meds: . [START ON 03/11/2017] digoxin  0.25 mg Intravenous Daily  . lactulose  10 g Oral TID  . lactulose  300 mL Rectal BID  . piperacillin-tazobactam (ZOSYN)  IV  3.375 g Intravenous Q8H  . rifaximin  550 mg Oral BID  . vancomycin  1,000 mg Intravenous Q48H   Continuous Infusions: . sodium chloride 50 mL/hr at 03/10/17 1541   PRN Meds:.ondansetron **OR** ondansetron (ZOFRAN) IV Allergies  Allergen Reactions  . Fish Allergy Anaphylaxis and Other (See Comments)    Diarrhea   . Ambien [Zolpidem Tartrate] Other (See Comments)   Review of Systems unable  Physical Exam  Frail, chronically ill appearing male CV tachycardic Resp NAD Abdomen distended, NT, Fluid wave.  Vital  Signs: BP (!) 106/56 (BP Location: Right Arm)   Pulse (!) 106   Temp 99.6 F (37.6 C) (Oral)   Resp 17   Ht  (1.753 m)   Wt 68.4 kg (150 lb 12.7 oz)   SpO2 100%   BMI 22.27 kg/m  Pain Assessment: CPOT       SpO2: SpO2: 100 % O2 Device:SpO2: 100 %  O2 Flow Rate: .   IO: Intake/output summary:  Intake/Output Summary (Last 24 hours) at 03/10/17 1801 Last data filed at 03/10/17 1648  Gross per 24 hour  Intake          1351.25 ml  Output              650 ml  Net           701.25 ml    LBM: Last BM Date: 03/09/17 Baseline Weight: Weight: 68.4 kg (150 lb 12.7 oz) Most recent weight: Weight: 68.4 kg (150 lb 12.7 oz)     Palliative Assessment/Data:     Time In: 3:00 Time Out: 4:10 Time Total: 70 min. Greater than 50%  of this time was spent counseling and coordinating care related to the above assessment and plan.  Signed by: Algis Downs, PA-C Palliative Medicine Pager: [phone]  Please contact Palliative Medicine Team phone at [phone] for questions and concerns.  For individual provider: See Loretha Stapler

## 2017-03-10 NOTE — Progress Notes (Signed)
Pharmacy Antibiotic Note  Jeremy Herman is a 71 y.o. male admitted on 03/08/2017 with pneumonia. Pharmacy has been consulted for vancomycin + zosyn dosing.   Continues on abx for sepsis. CXR negative. Afebrile, WBC 14.6. UOP low.  Plan: Continue Zosyn 3.375 gm IV q8h (4 hour infusion) Start vancomycin 1g IV Q48h Monitor clinical picture, renal function, VT prn F/U C&S, abx deescalation / LOT F/U BCID  Height:  (175.3 cm) Weight: 150 lb 12.7 oz (68.4 kg) IBW/kg (Calculated) : 70.7  Recent Labs Lab 03/08/17 1935 03/08/17 2000 03/08/17 2310 03/09/17 0545 03/09/17 0755 03/10/17 0207  WBC 22.1*  --   --  14.8*  --  14.6*  CREATININE 2.91*  --   --  2.90*  --  2.92*  LATICACIDVEN  --  2.43* 3.4* 1.5 1.6  --     Estimated Creatinine Clearance: 23.1 mL/min (A) (by C-G formula based on SCr of 2.92 mg/dL (H)).    Allergies  Allergen Reactions  . Fish Allergy Anaphylaxis and Other (See Comments)    Diarrhea   . Ambien [Zolpidem Tartrate] Other (See Comments)    Antimicrobials this admission:  Zosyn 4/11 >>  Vancomycin 4/11 >>  Dose adjustments this admission:  N/a  Microbiology results:  4/11 BCx: 1/2 GPC in clusters 4/12 MRSA PCR: sent  Thank you for allowing pharmacy to be a part of this patient's care.  Armandina Stammer 03/10/2017 2:54 PM

## 2017-03-11 LAB — CBC
HEMATOCRIT: 27.6 % — AB (ref 39.0–52.0)
Hemoglobin: 9.3 g/dL — ABNORMAL LOW (ref 13.0–17.0)
MCH: 32 pg (ref 26.0–34.0)
MCHC: 33.7 g/dL (ref 30.0–36.0)
MCV: 94.8 fL (ref 78.0–100.0)
Platelets: 159 10*3/uL (ref 150–400)
RBC: 2.91 MIL/uL — ABNORMAL LOW (ref 4.22–5.81)
RDW: 17.1 % — AB (ref 11.5–15.5)
WBC: 16.2 10*3/uL — ABNORMAL HIGH (ref 4.0–10.5)

## 2017-03-11 LAB — COMPREHENSIVE METABOLIC PANEL
ALBUMIN: 2.1 g/dL — AB (ref 3.5–5.0)
ALT: 22 U/L (ref 17–63)
AST: 40 U/L (ref 15–41)
Alkaline Phosphatase: 79 U/L (ref 38–126)
Anion gap: 14 (ref 5–15)
BILIRUBIN TOTAL: 1.5 mg/dL — AB (ref 0.3–1.2)
BUN: 65 mg/dL — ABNORMAL HIGH (ref 6–20)
CHLORIDE: 118 mmol/L — AB (ref 101–111)
CO2: 15 mmol/L — ABNORMAL LOW (ref 22–32)
CREATININE: 2.82 mg/dL — AB (ref 0.61–1.24)
Calcium: 7.8 mg/dL — ABNORMAL LOW (ref 8.9–10.3)
GFR calc Af Amer: 25 mL/min — ABNORMAL LOW (ref >60)
GFR calc non Af Amer: 21 mL/min — ABNORMAL LOW (ref >60)
Glucose, Bld: 131 mg/dL — ABNORMAL HIGH (ref 65–99)
POTASSIUM: 3.2 mmol/L — AB (ref 3.5–5.1)
Sodium: 147 mmol/L — ABNORMAL HIGH (ref 135–145)
TOTAL PROTEIN: 6.1 g/dL — AB (ref 6.5–8.1)

## 2017-03-11 MED ORDER — SODIUM CHLORIDE 0.9 % IV SOLN
30.0000 meq | Freq: Once | INTRAVENOUS | Status: AC
  Administered 2017-03-11: 30 meq via INTRAVENOUS
  Filled 2017-03-11: qty 15

## 2017-03-11 NOTE — Progress Notes (Signed)
CSW has been made aware of patients situation. CSW tired contacting patients emergency contact as well as left voicemail to please contact CSW back, but phone call has not been returned. CSW had requested an official consult to [facility] for patient placement. CSW spoke to Amy from [facility] and stated to her that patient does not have family to sign patient in. Amy stated she will reach out to CSW assistant Director to see if he would be able to sign patient in. CSW has made MD aware of patients discharge plan  Marrianne Mood, MSW,  [place] [phone]

## 2017-03-11 NOTE — Progress Notes (Signed)
[facility] Hospital Liaison: RN  Morrie Sheldon, CSW called to inquire on beds at [facility] today.  No bed availability for today.   She also advised that patient has no family to complete paperwork.  That she had tried to reach the emergency contact numbers on file, but unable to.  The questions was asked if the assistant director of the floor could sign consents for placement.    I have contacted Director at [facility] and left a message for Morrie Sheldon to contact me back.  I will update at that time.  Thank you,  Adele Barthel, RN, BSN [facility] Liaison [phone]  All hospital liaison's are now on AMION.

## 2017-03-11 NOTE — Progress Notes (Signed)
CSW received a phone call back from one of patient emergency contact. Jeremy Herman stated he was patients neighbor in the past and tries to assist the patient when he can. CSW ask Jeremy Herman if he would be willing to sign patient into a residential hospice, Jeremy Herman declined stating that he does not feel comfortable doing that.CSW is still unable to get in contact with any other family member.  Marrianne Mood, MSW,  [employer] [phone]

## 2017-03-11 NOTE — Progress Notes (Signed)
Patient seen and examined. No discernible difference since admission in terms of mentation. Previously patient has wished to be DO NOT RESUSCITATE and was willing for comfort care. It is my opinion that patient is clinically stabilized sufficiently for discharge to freestanding hospice at that is available. I made several attempts to contact multiple family members  Please see discharge summary that will follow if that does become available later today 4/14. Expected prognosis less than 2 weeks

## 2017-03-11 NOTE — Progress Notes (Signed)
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 CvC liver Atrial fibrillation/SVT in setting up a hospitalization 2012 Liver cirrhosis probably at least secondary to elevated right heart shows with chronic venous congestion liver causing the same DVT PE diagnosed in 08/2013 on Coumadin HLD Tobacco abuse Complex right kidney cyst Microcytic anemia Right hip fracture status post repair 09/2015, left hip fracture repaired 03/2016  Admit 3/21-4/3 progressive shortness of breath worsening abdominal distention-CT angiogram chest showed interval clot burden-on that admission found to have ileus, AK I and found to have acute hepatic encephalopathy as well and atrial fibrillation  Patient saw palliative care last admission-was being referred to hospice as an out agent but seemed to be more listless and he apparently vomited some blood after being fed and came to the emergency room  Tachycardic 130s INR 2.5 lactic S2 0.4 atrial fibrillation and started on broad-spectrum antibiotic empiric PPI and octreotide and being treated for sepsis  likely will have overall poor outcome-no family to discuss with--[facility] soon   Assessment & Plan:   Principal Problem:   Sepsis (HCC) Active Problems:   Bilateral pulmonary embolism (HCC)   Chronic hypotension   Pulmonary hypertension (HCC)   Chronic pulmonary embolism (HCC)   Ascites due to alcoholic cirrhosis (HCC)   Atrial fibrillation with RVR (HCC)   Protein-calorie malnutrition, severe   Palliative care encounter   Acute renal failure (ARF) (HCC)   Encephalopathy, hepatic (HCC)  Toxic encephalopathy-?Hepatic vs infection  Treating for sepsis + Hepatic cause-Lactulose enema till mentation clears.   Ammonia 132 on admit.  Re-check x 1 in am  Hematemesis  Admitting M.D. discussed with GI. Not candidate for aggressive measures or endoscopy at this stage. DDX leading varices versus gastropathy or even hemetemesis. D/c octreotide and PPI   Holding anticoagulation--no documented GI bleed-  Is still too sleepy for PO  Acute hypoxic respiratory failure  Hypoxic on admit which was not sustained.  Was on Bipap-d/c and seems to sat at 100%  Possible Sepsis ? Cause-possible urinary vs SBP at admission  Sepsis bundle, blood culture pending, continue vancomycin and Zosyn until patient is d/c to Hospice-palliative  formal input appreciated. High risk for poor outcomes. Lactic acid 2.4 on admission FLuids now at 50 cc/h  AK I with met acidosis 2/2 to Obstructive uropathy  Baseline creatinine 0.9 on 02/26/2017-->2.9 -Follow urine analysis, feNA-given albumin this admission. Note was on Lasix 40 daily/Aldactone 100 daily as outpatient.  Bladder scan bedside [condom cath placed in ed] showed 999 urine.  Place foley and keep indwelling. Creatinine has stabilized.  Keep foley on d/c  Atrial fibrillation [place] score 4 with RVR. Off of Coumadin.  INR 2.4. Held metoprolol 12.5 twice a day as sleepy.  Holding Iv metoprolol 5 mg q6 as hypotensive 90 systolic Given Cardizem 10/in ED. Given digoxin 0.125 on admission--changed to IV 125 MCG for now- and increased for total dose of 250 mcg from 4/14 am--aware of renal side effects and will probably discontinue on d/c Replace electrolytes aggressively while on this--giving IV potassium 30 today and check am magensium  Acute on chronic right heart failure  Holding diuretics given rising creatinine-see above discussion  Cirrhosis query cause probably CBC liver  Holding Rifaximin.  NO furtheri ntervention nor work-up  like paracentesis    SCD No family-called neighbour 4/14 [facility]  Consultants:   Gastroenterology Sadie Haber was consulted on  admission  Procedures:     Antimicrobials:   Vancomycin and Zosyn    Subjective:  More awake, not alert-cannot orient reliably/consistently and falls asleep Nursing reports no new real issue--Hypotension remains  Objective: Vitals:   03/11/17 1000 03/11/17 1100 03/11/17 1400 03/11/17 1529  BP: (!) 83/73 103/61 (!) 86/51 103/70  Pulse: (!) 122 (!) 108 (!) 101 92  Resp: _0 Temp:  97.4 F (36.3 C)  97.5 F (36.4 C)  TempSrc:  Axillary  Axillary  SpO2: 97% 100% 100% 96%  Weight:      Height:        Intake/Output Summary (Last 24 hours) at 03/11/17 1603 Last data filed at 03/11/17 1427  Gross per 24 hour  Intake          2899.17 ml  Output              400 ml  Net          2499.17 ml   Filed Weights   03/09/17 1617  Weight: 68.4 kg (150 lb 12.7 oz)    Examination:  General exam: sleepy confused Respiratory system: Clear to auscultation.  Cardiovascular system: S1 & S2 heard-tachycardic-seems reg. No JVD, murmurs. + pedal edema but no LE edema Gastrointestinal system: distended abd-shift dullness.  Flanks are full Central nervous system: confused.  Moving 4 limbs.  Neuro exam not completely possible-cannot orient but is more awake    Data Reviewed: I have personally reviewed following labs and imaging studies  CBC:  Recent Labs Lab 03/08/17 1935 03/09/17 0545 03/10/17 0207 03/11/17 0158  WBC 22.1* 14.8* 14.6* 16.2*  NEUTROABS 19.6*  --   --   --   HGB 10.3* 8.8* 9.3* 9.3*  HCT 29.4* 25.6* 26.8* 27.6*  MCV 93.6 93.8 96.1 94.8  PLT 186 145* 138* 552   Basic Metabolic Panel:  Recent Labs Lab 03/08/17 1935 03/09/17 0545 03/10/17 0207 03/11/17 0158  NA 141 142 144 147*  K 4.0 3.9 3.8 3.2*  CL 115* 115* 121* 118*  CO2 15* 13* 12* 15*  GLUCOSE 161* 148* 126* 131*  BUN 68* 68* 68* 65*  CREATININE 2.91* 2.90* 2.92* 2.82*  CALCIUM 8.2* 8.1* 7.7* 7.8*   GFR: Estimated Creatinine Clearance: 23.9 mL/min (A) (by C-G formula based on SCr of  2.82 mg/dL (H)). Liver Function Tests:  Recent Labs Lab 03/08/17 1935 03/09/17 0545 03/10/17 0207 03/11/17 0158  AST 43* 35 43* 40  ALT _1 ALKPHOS 87 72 73 79  BILITOT 1.3* 1.6* 1.4* 1.5*  PROT 6.5 6.2* 6.2* 6.1*  ALBUMIN 2.3* 2.5* 2.3* 2.1*    Recent Labs Lab 03/08/17 1935  LIPASE 18    Recent Labs Lab 03/08/17 2315  AMMONIA 132*   Coagulation Profile:  Recent Labs Lab 03/08/17 1935 03/09/17 0545 03/10/17 0207  INR 2.53 2.84 2.44   Cardiac Enzymes: No results for input(s): CKTOTAL, CKMB, CKMBINDEX, TROPONINI in the last 168 hours. BNP (last 3 results) No results for input(s): PROBNP in the last 8760 hours. HbA1C: No results for input(s): HGBA1C in the last 72 hours. CBG: No results for input(s): GLUCAP in the last 168 hours. Lipid Profile: No results for input(s): CHOL, HDL, LDLCALC, TRIG, CHOLHDL, LDLDIRECT in the last 72 hours. Thyroid Function Tests: No results for input(s): TSH, T4TOTAL, FREET4, T3FREE, THYROIDAB  in the last 72 hours. Anemia Panel: No results for input(s): VITAMINB12, FOLATE, FERRITIN, TIBC, IRON, RETICCTPCT in the last 72 hours. Urine analysis:    Component Value Date/Time   COLORURINE AMBER (A) 02/15/2017 2233   APPEARANCEUR HAZY (A) 02/15/2017 2233   LABSPEC 1.020 02/15/2017 2233   PHURINE 5.0 02/15/2017 2233   GLUCOSEU NEGATIVE 02/15/2017 2233   HGBUR NEGATIVE 02/15/2017 2233   BILIRUBINUR SMALL (A) 02/15/2017 2233   KETONESUR NEGATIVE 02/15/2017 2233   PROTEINUR 30 (A) 02/15/2017 2233   UROBILINOGEN 1.0 12/26/2010 0531   NITRITE NEGATIVE 02/15/2017 2233   LEUKOCYTESUR NEGATIVE 02/15/2017 2233   Sepsis Labs: _0 (procalcitonin:4,lacticidven:4)  ) Recent Results (from the past 240 hour(s))  Culture, blood (single)     Status: Abnormal (Preliminary result)   Collection Time: 03/08/17 11:10 PM  Result Value Ref Range Status   Specimen Description BLOOD RIGHT HAND  Final   Special Requests IN PEDIATRIC  BOTTLE BCAV  Final   Culture  Setup Time   Final    GRAM POSITIVE COCCI IN CLUSTERS IN PEDIATRIC BOTTLE CRITICAL RESULT CALLED TO, READ BACK BY AND VERIFIED WITH: L. Bajbus Pharm.D. 17:25 03/10/17 ([email or web address])    Culture (A)  Final    STAPHYLOCOCCUS SPECIES (COAGULASE NEGATIVE) THE SIGNIFICANCE OF ISOLATING THIS ORGANISM FROM A SINGLE SET OF BLOOD CULTURES WHEN MULTIPLE SETS ARE DRAWN IS UNCERTAIN. PLEASE NOTIFY THE MICROBIOLOGY DEPARTMENT WITHIN ONE WEEK IF SPECIATION AND SENSITIVITIES ARE REQUIRED.    Report Status PENDING  Incomplete  Blood Culture ID Panel (Reflexed)     Status: Abnormal   Collection Time: 03/08/17 11:10 PM  Result Value Ref Range Status   Enterococcus species NOT DETECTED NOT DETECTED Final   Listeria monocytogenes NOT DETECTED NOT DETECTED Final   Staphylococcus species DETECTED (A) NOT DETECTED Final    Comment: Methicillin (oxacillin) resistant coagulase negative staphylococcus. Possible blood culture contaminant (unless isolated from more than one blood culture draw or clinical case suggests pathogenicity). No antibiotic treatment is indicated for blood  culture contaminants. CRITICAL RESULT CALLED TO, READ BACK BY AND VERIFIED WITH: L. Bajbur Pharm.D. 17:25 03/10/17 ([email or web address])    Staphylococcus aureus NOT DETECTED NOT DETECTED Final   Methicillin resistance DETECTED (A) NOT DETECTED Final    Comment: CRITICAL RESULT CALLED TO, READ BACK BY AND VERIFIED WITH: L. Bajbus Pharm.D. 17:25 03/10/17 ([email or web address])    Streptococcus species NOT DETECTED NOT DETECTED Final   Streptococcus agalactiae NOT DETECTED NOT DETECTED Final   Streptococcus pneumoniae NOT DETECTED NOT DETECTED Final   Streptococcus pyogenes NOT DETECTED NOT DETECTED Final   Acinetobacter baumannii NOT DETECTED NOT DETECTED Final   Enterobacteriaceae species NOT DETECTED NOT DETECTED Final   Enterobacter cloacae complex NOT DETECTED NOT DETECTED Final   Escherichia coli NOT DETECTED NOT DETECTED Final    Klebsiella oxytoca NOT DETECTED NOT DETECTED Final   Klebsiella pneumoniae NOT DETECTED NOT DETECTED Final   Proteus species NOT DETECTED NOT DETECTED Final   Serratia marcescens NOT DETECTED NOT DETECTED Final   Haemophilus influenzae NOT DETECTED NOT DETECTED Final   Neisseria meningitidis NOT DETECTED NOT DETECTED Final   Pseudomonas aeruginosa NOT DETECTED NOT DETECTED Final   Candida albicans NOT DETECTED NOT DETECTED Final   Candida glabrata NOT DETECTED NOT DETECTED Final   Candida krusei NOT DETECTED NOT DETECTED Final   Candida parapsilosis NOT DETECTED NOT DETECTED Final   Candida tropicalis NOT DETECTED NOT DETECTED Final  MRSA PCR Screening     Status: None   Collection Time: 03/09/17  5:43 AM  Result Value Ref Range Status   MRSA by PCR NEGATIVE NEGATIVE Final    Comment:        The GeneXpert MRSA Assay (FDA approved for NASAL specimens only), is one component of a comprehensive MRSA colonization surveillance program. It is not intended to diagnose MRSA infection nor to guide or monitor treatment for MRSA infections.          Radiology Studies: Dg Chest Port 1 View  Result Date: 03/10/2017 CLINICAL DATA:  Sepsis, COPD, CHF EXAM: PORTABLE CHEST 1 VIEW COMPARISON:  03/08/2017 FINDINGS: Mild vascular congestion. Linear areas of atelectasis or scarring in the lungs bilaterally. Mild interstitial prominence is increased since prior study could reflect interstitial edema. Heart is upper limits normal in size. No effusions. IMPRESSION: Mild vascular congestion with increasing interstitial prominence, question interstitial edema. Electronically Signed   By: Rolm Baptise M.D.   On: 03/10/2017 08:11     Scheduled Meds: . digoxin  0.25 mg Intravenous Daily  . lactulose  10 g Oral TID  . lactulose  300 mL Rectal BID  . mouth rinse  15 mL Mouth Rinse BID  . piperacillin-tazobactam (ZOSYN)  IV  3.375 g Intravenous Q8H  . rifaximin  550 mg Oral BID  . vancomycin  1,000 mg  Intravenous Q48H   Continuous Infusions: . sodium chloride 50 mL/hr at 03/11/17 1100     LOS: 3 days    Time spent: Pierce, MD [facility] ([phone]   If 7PM-7AM, please contact night-coverage [email or web address] Password [email or web address] 03/11/2017, 4:03 PM

## 2017-03-11 NOTE — Progress Notes (Signed)
[facility] Liaison:  RN   Received request from [place], CSW for interest in [facility] with request to transfer today.  No [facility] beds available today, but will continue to monitor for availability.  Patient is unable to communicate wishes and has no family to contact.  Hospital and [facility] have been unable to reach the two contacts (friends) he has on file.  Chart is currently under review.  York Ram, Asst. Director over CSW's has advised that he is willing to sign all consents and confirms that patient will be coming back to the morgue at [facility] after death for arrangements to be made by [facility].   Again, this is all contingent on bed availability and patient eligibility.    I will let CSW know when more information is available.   Thank you,  Adele Barthel, RN, BSN [facility] Liaison [phone]  All hospital liaison's are now on AMION.

## 2017-03-11 NOTE — Plan of Care (Signed)
Problem: Activity: Goal: Ability to tolerate increased activity will improve Outcome: Not Progressing Pt is somnolent, still is total q 2 hour turns

## 2017-03-11 NOTE — Progress Notes (Signed)
MD recommending hospice placement at this time. Wishes to consult to social work for move to comfort care. Reached SW on-call who will speak with physician regarding placement today.  SW in contact with [facility] and SW director about signing patient into facility.

## 2017-03-12 LAB — COMPREHENSIVE METABOLIC PANEL
ALK PHOS: 65 U/L (ref 38–126)
ALT: 19 U/L (ref 17–63)
AST: 31 U/L (ref 15–41)
Albumin: 2 g/dL — ABNORMAL LOW (ref 3.5–5.0)
Anion gap: 12 (ref 5–15)
BUN: 72 mg/dL — ABNORMAL HIGH (ref 6–20)
CALCIUM: 8 mg/dL — AB (ref 8.9–10.3)
CO2: 13 mmol/L — ABNORMAL LOW (ref 22–32)
CREATININE: 2.84 mg/dL — AB (ref 0.61–1.24)
Chloride: 126 mmol/L — ABNORMAL HIGH (ref 101–111)
GFR calc Af Amer: 25 mL/min — ABNORMAL LOW (ref >60)
GFR, EST NON AFRICAN AMERICAN: 21 mL/min — AB (ref >60)
Glucose, Bld: 121 mg/dL — ABNORMAL HIGH (ref 65–99)
Potassium: 3.3 mmol/L — ABNORMAL LOW (ref 3.5–5.1)
SODIUM: 151 mmol/L — AB (ref 135–145)
TOTAL PROTEIN: 6.5 g/dL (ref 6.5–8.1)
Total Bilirubin: 1.5 mg/dL — ABNORMAL HIGH (ref 0.3–1.2)

## 2017-03-12 LAB — AMMONIA: AMMONIA: 32 umol/L (ref 9–35)

## 2017-03-12 LAB — MAGNESIUM: MAGNESIUM: 1.5 mg/dL — AB (ref 1.7–2.4)

## 2017-03-12 LAB — CULTURE, BLOOD (SINGLE)

## 2017-03-12 MED ORDER — LORAZEPAM 2 MG/ML PO CONC: 1.0000 mg | ORAL | 0 refills | Status: AC | PRN

## 2017-03-12 MED ORDER — HYDROMORPHONE HCL 1 MG/ML PO LIQD: 0.5000 mg | ORAL | 0 refills | Status: AC | PRN

## 2017-03-12 NOTE — Progress Notes (Signed)
Patient is slightly more alert but still not making consistent reproducible responses to simple questions he does however think that he is in hospice facility and when I ask him if he would be okay with end-of-life care, he notes he has. We have attempted multiple times to contact multiple family members. Given his progressive decline and overall very poor outcome, we will decide on placement at [facility] place if possible pending social worker in the period otherwise we have D escalated his care and will transfer him to a palliative unit out of the step down later today. I await social work input and nursing is aware to contact me if bed is available

## 2017-03-12 NOTE — Clinical Social Work Note (Signed)
CSW notified about this hospice upon arrival this AM.   CSW notified all hospice paper work is complete/signed.  DC Summary faxed to [facility]. DC Number given to nurse to call report.  Transport set up for pt to be transported to [facility]. No known family to contact to advise of transport.  Pt has no other DC needs at this time.  Javonda Suh B. Gean Quint Clinical Social Work Dept Weekend Social Worker [phone] 1:00 PM

## 2017-03-12 NOTE — Progress Notes (Signed)
[facility] Liaison:  RN  Received request from [place] for interest in [facility] with request to transfer when bed was available.  Chart reviewed.  Met with Nathaniel Man, Asst Director for social work at [facility] who is signing paperwork for patient. (Okay'd with Levada Dy, Director at [facility]).  Per CSW, no family or friends available to do paperwork for patient.  Edwyna Ready has agreed that patient will be returned to [facility] after death and [facility] will take care of all final arrangements.  Registration paperwork completed today.  Dr. Orpah Melter to assume care per [facility] request.  Please fax discharge summary to [phone].  RN, please call report to [phone].  Please arrange transport for patient to arrive as soon as possible.    Thank you,  Edyth Gunnels, RN, [facility] Liaison [phone]  All hospital liaison's are now on [place].

## 2017-03-12 NOTE — Discharge Summary (Signed)
Physician Discharge Summary  Jeremy Herman GMW:[id number] DOB: 05-04-1946 DOA: 03/08/2017  PCP: Minerva Ends, MD  Admit date: 03/08/2017 Discharge date: 03/12/2017  Time spent: 40 minutes  Recommendations for Outpatient Follow-up:  1. transfer to [facility] for EOL and comfort care 2. Hard script for Dilaudid and Ativan given at d/c 3. Foley to be kept and oxygen for comfort on d/c  Discharge Diagnoses:  Principal Problem:   Sepsis ([place]) Active Problems:   Bilateral pulmonary embolism (HCC)   Chronic hypotension   Pulmonary hypertension ([place])   Chronic pulmonary embolism (HCC)   Ascites due to alcoholic cirrhosis ([place])   Atrial fibrillation with RVR ([place])   Protein-calorie malnutrition, severe   Palliative care encounter   Acute renal failure (ARF) ([place])   Encephalopathy, hepatic ([place])   Discharge Condition: poor  Diet recommendation: comfort  Filed Weights   03/09/17 1617  Weight: 68.4 kg (150 lb 12.7 oz)    History of present illness:  70 Prior cavitary lung lesions 2012 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 CvC liver Atrial fibrillation/SVT in setting up a hospitalization 2012 Liver cirrhosis probably at least secondary to elevated right heart shows with chronic venous congestion liver causing the same DVT PE diagnosed in 08/2013 on Coumadin HLD Tobacco abuse Complex right kidney cyst Microcytic anemia Right hip fracture status post repair 09/2015, left hip fracture repaired 03/2016  Admit 3/21-4/3 progressive shortness of breath worsening abdominal distention-CT angiogram chest showed interval clot burden-on that admission found to have ileus, AK I and found to have acute hepatic encephalopathy as well and atrial fibrillation  Patient saw palliative care last admission-was being referred to hospice as an out agent but seemed to be more listless and he apparently vomited some blood  after being fed and came to the emergency room  Tachycardic 130s INR 2.5 lactic S2 0.4 atrial fibrillation and started on broad-spectrum antibiotic empiric PPI and octreotide and being treated for sepsis  Eventually had a discussion with palliative care here-attempted numerous times to contact his NOK , Ex-wife with no response and also his brother but unable to do the same. I had a short discussion when patient was slightly more orietned-although this wa sintermittent- and patient was comfortable with a plan for Hospice placement  Hospital Course:  Toxic encephalopathy-?Hepatic vs infection             Treating for sepsis + Hepatic cause-Lactulose enema till mentation clears.  Ammonia 132 on admit.  Re-check  Was lower--he has not returned to baseline and is only intermittently responsive  Hematemesis             Admitting M.D. discussed with GI. Not candidate for aggressive measures or endoscopy at this stage. DDX leading varices versus gastropathy or even hemetemesis. D/c octreotide and PPI   Holding anticoagulation--no documented GI bleed-  Is still too sleepy for PO--Comfort feed on d/c with high likelihood of aspiration  Acute hypoxic respiratory failure             Hypoxic on admit which was not sustained.  Was on Bipap-d/c and seems to sat at 100% Keep on oxygen for comfort on discharge  Possible Sepsis ? Cause-possible urinary vs SBP at admission             Sepsis bundle, blood culture pending, continue vancomycin and Zosyn until patient is d/c to Hospice-palliative  formal  input appreciated. High risk for poor outcomes. Lactic acid 2.4 on admission FLuids now at 50 cc/h--saline locked   AK I with met acidosis 2/2 to Obstructive uropathy             Baseline creatinine 0.9 on 02/26/2017-->2.9 -Follow urine analysis, feNA-given albumin this admission. Note was on Lasix 40 daily/Aldactone 100 daily as outpatient.  Bladder scan bedside [condom cath placed in ed] showed 999 urine.   Place foley and keep indwelling. Creatinine has stabilized.  Keep foley on d/c  Atrial fibrillation [place] score 4 with RVR. Off of Coumadin.             INR 2.4. Held metoprolol 12.5 twice a day as sleepy.  Holding Iv metoprolol 5 mg q6 as hypotensive 90 systolic Given Cardizem 82/LM ED. Given digoxin 0.125 on admission--changed to IV 125 MCG for now- and increased for total dose of 250 mcg from 4/14 am--discontinued given Hospice trajectory on d/c  Acute on chronic right heart failure             Holding diuretics given rising creatinine-see above discussion  Cirrhosis query cause probably CBC liver             Holding Rifaximin.  NO furtheri ntervention nor work-up like paracentesis   Procedures:  none  Consultations:  Palliative  Discharge Exam: Vitals:   03/12/17 0408 03/12/17 0731  BP: (!) 113/58 112/63  Pulse: (!) 103   Resp: 20 16  Temp: 97.8 F (36.6 C) 97.5 F (36.4 C)    General: eomi ncat Cardiovascular: s1 s2 no m/r/g Respiratory: clear no added sound  Discharge Instructions   Discharge Instructions    Diet - low sodium heart healthy    Complete by:  As directed    Increase activity slowly    Complete by:  As directed      Current Discharge Medication List    START taking these medications   Details  HYDROmorphone HCl (DILAUDID) 1 MG/ML LIQD Take 0.5 mLs (0.5 mg total) by mouth every 2 (two) hours as needed for severe pain. Qty: 56 mL, Refills: 0    LORazepam (ATIVAN) 2 MG/ML concentrated solution Take 0.5 mLs (1 mg total) by mouth every 4 (four) hours as needed for anxiety. Qty: 30 mL, Refills: 0      STOP taking these medications     apixaban (ELIQUIS) 5 MG TABS tablet      digoxin (LANOXIN) 0.125 MG tablet      folic acid (FOLVITE) 1 MG tablet      furosemide (LASIX) 40 MG tablet      lactulose (CHRONULAC) 10 GM/15ML solution      metoCLOPramide (REGLAN) 10 MG tablet      metoprolol tartrate (LOPRESSOR) 25 MG tablet       midodrine (PROAMATINE) 10 MG tablet      Multiple Vitamins-Minerals (MULTIVITAMIN) tablet      OVER THE COUNTER MEDICATION      rifaximin (XIFAXAN) 550 MG TABS tablet      senna-docusate (SENOKOT-S) 8.6-50 MG tablet      spironolactone (ALDACTONE) 100 MG tablet      thiamine (VITAMIN B-1) 100 MG tablet        Allergies  Allergen Reactions  . Fish Allergy Anaphylaxis and Other (See Comments)    Diarrhea   . Ambien [Zolpidem Tartrate] Other (See Comments)      The results of significant diagnostics from this hospitalization (including imaging, microbiology, ancillary and laboratory) are  listed below for reference.    Significant Diagnostic Studies: Dg Chest 2 View  Result Date: 02/15/2017 CLINICAL DATA:  Shortness of breath for 4 weeks EXAM: CHEST  2 VIEW COMPARISON:  01/11/2017 FINDINGS: There are low lung volumes. Subsegmental atelectasis in the right mid lung. No acute infiltrate or effusion. Heart size within normal limits. New ASD closure device. Atherosclerosis. No pneumothorax. IMPRESSION: Low lung volumes with subsegmental atelectasis in the right mid lung. No acute infiltrate or edema. Electronically Signed   By: Donavan Foil M.D.   On: 02/15/2017 16:51   Dg Abd 1 View  Result Date: 02/24/2017 CLINICAL DATA:  Ileus. EXAM: ABDOMEN - 1 VIEW COMPARISON:  02/23/2017 FINDINGS: Diffuse gaseous distention of bowel wall again noted, not significantly changed since prior study. No free air organomegaly. IMPRESSION: Stable diffuse gaseous distention of bowel, likely ileus. Electronically Signed   By: Rolm Baptise M.D.   On: 02/24/2017 08:10   Dg Abd 1 View  Result Date: 02/23/2017 CLINICAL DATA:  Abdominal distension EXAM: ABDOMEN - 1 VIEW COMPARISON:  KUB of February 21, 2017 FINDINGS: Persistent distention of small and large bowel loops with gas. There is some gas in the rectum. No free extraluminal gas collections are observed. There is degenerative disc disease of the lumbar  spine. IMPRESSION: Stable distention of small and large bowel loops consistent with ileus. Electronically Signed   By: David  Martinique M.D.   On: 02/23/2017 07:44   Dg Abd 1 View  Result Date: 02/21/2017 CLINICAL DATA:  Constipation.  Abdominal pain EXAM: ABDOMEN - 1 VIEW COMPARISON:  CT abdomen 2158 FINDINGS: There gas-filled loops of large and small bowel. Small amount of stool in the descending colon. Gas in the sigmoid colon. Small a gas in the rectum. IMPRESSION: Gas distended loops of large small bowel. No evidence bowel obstruction. Minimal stool burden. Electronically Signed   By: Suzy Bouchard M.D.   On: 02/21/2017 10:34   Ct Angio Chest Pe W And/or Wo Contrast  Result Date: 02/15/2017 CLINICAL DATA:  71 year old male with shortness of breath. History of pulmonary embolism. EXAM: CT ANGIOGRAPHY CHEST WITH CONTRAST TECHNIQUE: Multidetector CT imaging of the chest was performed using the standard protocol during bolus administration of intravenous contrast. Multiplanar CT image reconstructions and MIPs were obtained to evaluate the vascular anatomy. CONTRAST:  100 cc Isovue 370 COMPARISON:  Chest CT dated 11/09/2017 and 07/08/2014 FINDINGS: Cardiovascular: There is no cardiomegaly or pericardial effusion. Advanced multi vessel coronary vascular calcification involving the LAD, left circumflex artery, and right RCA. There is moderate atherosclerotic calcification of the thoracic aorta. No aneurysmal dilatation or evidence of dissection. There is non opacification of the visualized proximal left vertebral artery. This is of indeterminate chronicity. Correlation with clinical exam is recommended. CT angiography of the neck is recommended if there is clinical concern for acute left vertebral artery occlusion. There is dilatation of the main pulmonary artery suggestive of underlying pulmonary hypertension. Eccentric clot noted along the walls of the left pulmonary artery extending to the lobar and  segmental branches of the left lower lobe. There is interval increase in the clot burden compared to the prior CT of 01/10/2017. There is associated luminal narrowing of the the lobar and segmental branches of the left lower lobe pulmonary arteries without definite evidence of complete occlusion. There is no CT evidence of right heart straining. Mediastinum/Nodes: No hilar or mediastinal adenopathy. The esophagus and the thyroid gland are grossly unremarkable. Lungs/Pleura: Right lung base linear atelectasis/ scarring noted.  There is no focal consolidation, pleural effusion, or pneumothorax. The central airways are patent. Upper Abdomen: There is irregularity of the hepatic contour concerning for underlying cirrhosis. Partially visualized moderate size ascites in the upper abdomen has increased compared to the prior CT. Musculoskeletal: Osteopenia with degenerative changes of the spine. No acute fracture. There is loss of subcutaneous fat and cachexia. Review of the MIP images confirms the above findings. IMPRESSION: 1. Eccentric clot along the periphery of the left pulmonary artery extending into the lobar and segmental branches of the left lower lobe. There has been interval increase in the clot burden compared to the prior CT with associated moderate luminal narrowing. No definite occlusive thrombus/embolus identified. 2. Advanced coronary vascular calcification. 3. Morphologic changes of cirrhosis with partially visualized moderate upper abdominal ascites, increased since the prior CT. These results were called by telephone at the time of interpretation on 02/15/2017 at 10:48 pm to Dr. Brantley Stage , who verbally acknowledged these results. Electronically Signed   By: Anner Crete M.D.   On: 02/15/2017 22:51   US Renal  Result Date: 02/17/2017 CLINICAL DATA:  Patient with acute renal failure. EXAM: RENAL / URINARY TRACT ULTRASOUND COMPLETE COMPARISON:  CT abdomen pelvis 01/12/2017 FINDINGS: Right Kidney:  Length: 13.0 cm. Echogenicity within normal limits. No mass or hydronephrosis visualized. Left Kidney: Length: 11.5 cm. Echogenicity within normal limits. No mass or hydronephrosis visualized. Bladder: Decompressed. Ascites is noted throughout the abdomen. IMPRESSION: No hydronephrosis. Ascites. Electronically Signed   By: Lovey Newcomer M.D.   On: 02/17/2017 21:39   US Paracentesis  Result Date: 02/18/2017 INDICATION: History of alcoholic cirrhosis with ascites. Leukocytosis and abdominal pain. Request diagnostic and therapeutic paracentesis. EXAM: ULTRASOUND GUIDED LEFT LOWER QUADRANT PARACENTESIS MEDICATIONS: None. COMPLICATIONS: None immediate. PROCEDURE: Informed written consent was obtained from the patient after a discussion of the risks, benefits and alternatives to treatment. A timeout was performed prior to the initiation of the procedure. Initial ultrasound scanning demonstrates a large amount of ascites within the right lower abdominal quadrant. The right lower abdomen was prepped and draped in the usual sterile fashion. 1% lidocaine with epinephrine was used for local anesthesia. Following this, a 19 gauge, 7-cm, Yueh catheter was introduced. An ultrasound image was saved for documentation purposes. The paracentesis was performed. The catheter was removed and a dressing was applied. The patient tolerated the procedure well without immediate post procedural complication. FINDINGS: A total of approximately 2.0 L of cloudy yellow fluid was removed. Procedure terminated at this point due to hypotension. Residual ascites persists. Samples were sent to the laboratory as requested by the clinical team. IMPRESSION: Successful ultrasound-guided paracentesis yielding 2.0 liters of peritoneal fluid. The patient may return for therapeutic large volume paracentesis when blood pressure is improved. Read by: Ascencion Dike PA-C Electronically Signed   By: Markus Daft M.D.   On: 02/18/2017 09:53   Dg Chest Port 1  View  Result Date: 03/10/2017 CLINICAL DATA:  Sepsis, COPD, CHF EXAM: PORTABLE CHEST 1 VIEW COMPARISON:  03/08/2017 FINDINGS: Mild vascular congestion. Linear areas of atelectasis or scarring in the lungs bilaterally. Mild interstitial prominence is increased since prior study could reflect interstitial edema. Heart is upper limits normal in size. No effusions. IMPRESSION: Mild vascular congestion with increasing interstitial prominence, question interstitial edema. Electronically Signed   By: Rolm Baptise M.D.   On: 03/10/2017 08:11   Dg Chest Portable 1 View  Result Date: 03/08/2017 CLINICAL DATA:  Shortness of breath, hypoxia EXAM: PORTABLE CHEST 1 VIEW  COMPARISON:  CTA chest dated 02/15/2017 FINDINGS: Mild linear scarring/atelectasis along the right mid lung. No focal consolidation No pleural effusion or pneumothorax. The heart is normal in size. IMPRESSION: No evidence of acute cardiopulmonary disease. Electronically Signed   By: Julian Hy M.D.   On: 03/08/2017 19:54   Dg Abd 2 Views  Result Date: 02/26/2017 CLINICAL DATA:  Diarrhea and abdominal distention. EXAM: ABDOMEN - 2 VIEW COMPARISON:  02/24/2017. FINDINGS: Multiple dilated small bowel loops with mild improvement. Previously demonstrated gaseous distention of the transverse colon is no longer demonstrated. No free peritoneal air. Lumbar spine degenerative changes and hardware fixation of the left femur. IMPRESSION: Improving ileus or partial obstruction. Electronically Signed   By: Claudie Revering M.D.   On: 02/26/2017 08:45   Ir Paracentesis  Result Date: 02/20/2017 INDICATION: History of alcoholic cirrhosis with ascites. Leukocytosis and abdominal pain. Request therapeutic paracentesis. EXAM: ULTRASOUND GUIDED RIGHT LATERAL ABDOMEN PARACENTESIS MEDICATIONS: 1% Lidocaine = 10 mL. COMPLICATIONS: None immediate. PROCEDURE: Informed written consent was obtained from the patient after a discussion of the risks, benefits and alternatives  to treatment. A timeout was performed prior to the initiation of the procedure. Initial ultrasound scanning demonstrates a moderate amount of ascites within the right lower abdominal quadrant. The right lower abdomen was prepped and draped in the usual sterile fashion. 1% lidocaine with epinephrine was used for local anesthesia. Following this, a 19 gauge, 7-cm, Yueh catheter was introduced. An ultrasound image was saved for documentation purposes. The paracentesis was performed. The catheter was removed and a dressing was applied. The patient tolerated the procedure well without immediate post procedural complication. FINDINGS: A total of approximately 4.3 liters of clear yellow fluid was removed. Samples were sent to the laboratory as requested by the clinical team. IMPRESSION: Successful ultrasound-guided paracentesis yielding 4.3 liters of peritoneal fluid. Read by:  Gareth Eagle, PA-C Electronically Signed   By: Marybelle Killings M.D.   On: 02/20/2017 13:14    Microbiology: Recent Results (from the past 240 hour(s))  Culture, blood (single)     Status: Abnormal   Collection Time: 03/08/17 11:10 PM  Result Value Ref Range Status   Specimen Description BLOOD RIGHT HAND  Final   Special Requests IN PEDIATRIC BOTTLE BCAV  Final   Culture  Setup Time   Final    GRAM POSITIVE COCCI IN CLUSTERS IN PEDIATRIC BOTTLE CRITICAL RESULT CALLED TO, READ BACK BY AND VERIFIED WITH: L. Bajbus Pharm.D. 17:25 03/10/17 ([email or web address])    Culture (A)  Final    STAPHYLOCOCCUS SPECIES (COAGULASE NEGATIVE) THE SIGNIFICANCE OF ISOLATING THIS ORGANISM FROM A SINGLE SET OF BLOOD CULTURES WHEN MULTIPLE SETS ARE DRAWN IS UNCERTAIN. PLEASE NOTIFY THE MICROBIOLOGY DEPARTMENT WITHIN ONE WEEK IF SPECIATION AND SENSITIVITIES ARE REQUIRED.    Report Status 03/12/2017 FINAL  Final  Blood Culture ID Panel (Reflexed)     Status: Abnormal   Collection Time: 03/08/17 11:10 PM  Result Value Ref Range Status   Enterococcus species NOT DETECTED  NOT DETECTED Final   Listeria monocytogenes NOT DETECTED NOT DETECTED Final   Staphylococcus species DETECTED (A) NOT DETECTED Final    Comment: Methicillin (oxacillin) resistant coagulase negative staphylococcus. Possible blood culture contaminant (unless isolated from more than one blood culture draw or clinical case suggests pathogenicity). No antibiotic treatment is indicated for blood  culture contaminants. CRITICAL RESULT CALLED TO, READ BACK BY AND VERIFIED WITH: L. Bajbur Pharm.D. 17:25 03/10/17 ([email or web address])    Staphylococcus aureus NOT DETECTED NOT DETECTED Final  Methicillin resistance DETECTED (A) NOT DETECTED Final    Comment: CRITICAL RESULT CALLED TO, READ BACK BY AND VERIFIED WITH: L. Bajbus Pharm.D. 17:25 03/10/17 ([email or web address])    Streptococcus species NOT DETECTED NOT DETECTED Final   Streptococcus agalactiae NOT DETECTED NOT DETECTED Final   Streptococcus pneumoniae NOT DETECTED NOT DETECTED Final   Streptococcus pyogenes NOT DETECTED NOT DETECTED Final   Acinetobacter baumannii NOT DETECTED NOT DETECTED Final   Enterobacteriaceae species NOT DETECTED NOT DETECTED Final   Enterobacter cloacae complex NOT DETECTED NOT DETECTED Final   Escherichia coli NOT DETECTED NOT DETECTED Final   Klebsiella oxytoca NOT DETECTED NOT DETECTED Final   Klebsiella pneumoniae NOT DETECTED NOT DETECTED Final   Proteus species NOT DETECTED NOT DETECTED Final   Serratia marcescens NOT DETECTED NOT DETECTED Final   Haemophilus influenzae NOT DETECTED NOT DETECTED Final   Neisseria meningitidis NOT DETECTED NOT DETECTED Final   Pseudomonas aeruginosa NOT DETECTED NOT DETECTED Final   Candida albicans NOT DETECTED NOT DETECTED Final   Candida glabrata NOT DETECTED NOT DETECTED Final   Candida krusei NOT DETECTED NOT DETECTED Final   Candida parapsilosis NOT DETECTED NOT DETECTED Final   Candida tropicalis NOT DETECTED NOT DETECTED Final  MRSA PCR Screening     Status: None   Collection Time:  03/09/17  5:57 AM  Result Value Ref Range Status   MRSA by PCR NEGATIVE NEGATIVE Final    Comment:        The GeneXpert MRSA Assay (FDA approved for NASAL specimens only), is one component of a comprehensive MRSA colonization surveillance program. It is not intended to diagnose MRSA infection nor to guide or monitor treatment for MRSA infections.      Labs: Basic Metabolic Panel:  Recent Labs Lab 03/08/17 1935 03/09/17 0545 03/10/17 0207 03/11/17 0158 03/12/17 0318  NA 141 142 144 147* 151*  K 4.0 3.9 3.8 3.2* 3.3*  CL 115* 115* 121* 118* 126*  CO2 15* 13* 12* 15* 13*  GLUCOSE 161* 148* 126* 131* 121*  BUN 68* 68* 68* 65* 72*  CREATININE 2.91* 2.90* 2.92* 2.82* 2.84*  CALCIUM 8.2* 8.1* 7.7* 7.8* 8.0*  MG  --   --   --   --  1.5*   Liver Function Tests:  Recent Labs Lab 03/08/17 1935 03/09/17 0545 03/10/17 0207 03/11/17 0158 03/12/17 0318  AST 43* 35 43* 40 31  ALT 24 21 21 22 19   ALKPHOS 87 72 73 79 65  BILITOT 1.3* 1.6* 1.4* 1.5* 1.5*  PROT 6.5 6.2* 6.2* 6.1* 6.5  ALBUMIN 2.3* 2.5* 2.3* 2.1* 2.0*    Recent Labs Lab 03/08/17 1935  LIPASE 18    Recent Labs Lab 03/08/17 2315 03/12/17 0318  AMMONIA 132* 32   CBC:  Recent Labs Lab 03/08/17 1935 03/09/17 0545 03/10/17 0207 03/11/17 0158  WBC 22.1* 14.8* 14.6* 16.2*  NEUTROABS 19.6*  --   --   --   HGB 10.3* 8.8* 9.3* 9.3*  HCT 29.4* 25.6* 26.8* 27.6*  MCV 93.6 93.8 96.1 94.8  PLT 186 145* 138* 159   Cardiac Enzymes: No results for input(s): CKTOTAL, CKMB, CKMBINDEX, TROPONINI in the last 168 hours. BNP: BNP (last 3 results)  Recent Labs  01/10/17 1029 02/15/17 1615  BNP 93.8 172.1*    ProBNP (last 3 results) No results for input(s): PROBNP in the last 8760 hours.  CBG: No results for input(s): GLUCAP in the last 168 hours.     Signed:  Nita Sells MD  [facility] 03/12/2017, 11:14 AM

## 2017-03-12 NOTE — Progress Notes (Signed)
Called report to RN at [facility] (13:15). Documents to be signed by MD.

## 2017-03-12 NOTE — Progress Notes (Signed)
Spoke with Adele Barthel (hospice)- informed her of physician discharge orders to [facility] and inquired about bed placement.  She is waiting on the consents to be signed by the Director of CSW, as the patient has no family. He should be in the office to sign the paperwork within the next couple hours.  Amy confirmed that she would call me after the consents are signed. Will follow up with CSW.

## 2017-03-28 DEATH — deceased

## 2017-04-12 ENCOUNTER — Encounter: Payer: Self-pay | Admitting: Family Medicine

## 2017-09-27 IMAGING — CR DG CHEST 1V PORT
1 series · 1 of 1 positions shown · non-contrast
Comparison: CTA chest dated 02/15/2017

CLINICAL DATA: Shortness of breath, hypoxia

EXAM:
PORTABLE CHEST 1 VIEW

[AP]
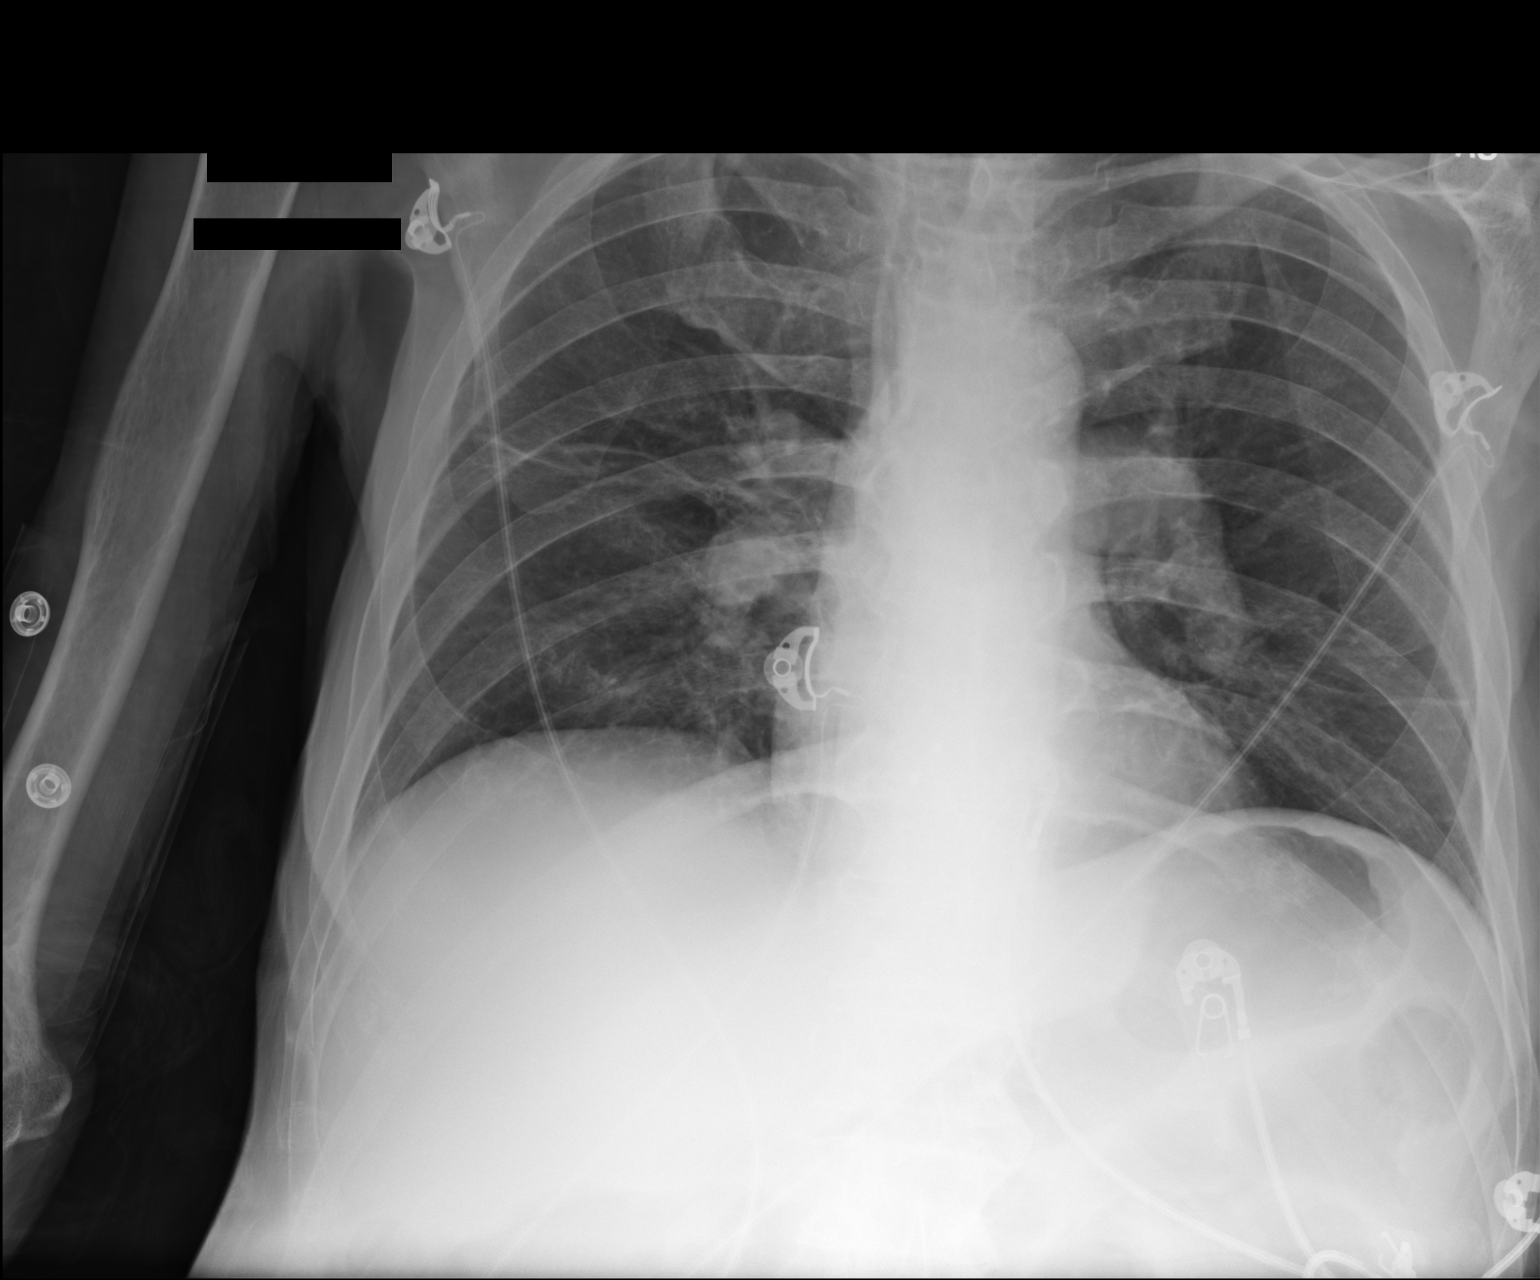

[1 of 1 positions shown; findings below may reference images not displayed]

FINDINGS: Mild linear scarring/atelectasis along the right mid lung. No focal
consolidation

No pleural effusion or pneumothorax.

The heart is normal in size.
IMPRESSION: No evidence of acute cardiopulmonary disease.
